# Patient Record
Sex: Male | Born: 1940 | Race: White | Hispanic: No | Marital: Married | Smoking: Former smoker
Health system: Southern US, Community
[De-identification: ages and names within clinical notes are randomized; demographics above are authoritative.]

## PROBLEM LIST (undated history)

## (undated) DIAGNOSIS — Z87442 Personal history of urinary calculi: Secondary | ICD-10-CM

## (undated) DIAGNOSIS — I674 Hypertensive encephalopathy: Secondary | ICD-10-CM

## (undated) DIAGNOSIS — I219 Acute myocardial infarction, unspecified: Secondary | ICD-10-CM

## (undated) DIAGNOSIS — R410 Disorientation, unspecified: Secondary | ICD-10-CM

## (undated) DIAGNOSIS — I6350 Cerebral infarction due to unspecified occlusion or stenosis of unspecified cerebral artery: Secondary | ICD-10-CM

## (undated) DIAGNOSIS — E039 Hypothyroidism, unspecified: Secondary | ICD-10-CM

## (undated) DIAGNOSIS — I609 Nontraumatic subarachnoid hemorrhage, unspecified: Secondary | ICD-10-CM

## (undated) DIAGNOSIS — I2510 Atherosclerotic heart disease of native coronary artery without angina pectoris: Secondary | ICD-10-CM

## (undated) DIAGNOSIS — G709 Myoneural disorder, unspecified: Secondary | ICD-10-CM

## (undated) DIAGNOSIS — E876 Hypokalemia: Secondary | ICD-10-CM

## (undated) DIAGNOSIS — G4730 Sleep apnea, unspecified: Secondary | ICD-10-CM

## (undated) DIAGNOSIS — C140 Malignant neoplasm of pharynx, unspecified: Secondary | ICD-10-CM

## (undated) DIAGNOSIS — N179 Acute kidney failure, unspecified: Secondary | ICD-10-CM

## (undated) DIAGNOSIS — Z8673 Personal history of transient ischemic attack (TIA), and cerebral infarction without residual deficits: Secondary | ICD-10-CM

## (undated) DIAGNOSIS — N529 Male erectile dysfunction, unspecified: Secondary | ICD-10-CM

## (undated) DIAGNOSIS — E785 Hyperlipidemia, unspecified: Secondary | ICD-10-CM

## (undated) DIAGNOSIS — I10 Essential (primary) hypertension: Secondary | ICD-10-CM

## (undated) DIAGNOSIS — R51 Headache: Secondary | ICD-10-CM

## (undated) DIAGNOSIS — I639 Cerebral infarction, unspecified: Secondary | ICD-10-CM

## (undated) HISTORY — DX: Hypertensive encephalopathy: I67.4

## (undated) HISTORY — DX: Atherosclerotic heart disease of native coronary artery without angina pectoris: I25.10

## (undated) HISTORY — PX: EYE SURGERY: SHX253

## (undated) HISTORY — DX: Acute kidney failure, unspecified: N17.9

## (undated) HISTORY — DX: Cerebral infarction due to unspecified occlusion or stenosis of unspecified cerebral artery: I63.50

## (undated) HISTORY — DX: Male erectile dysfunction, unspecified: N52.9

## (undated) HISTORY — PX: OTHER SURGICAL HISTORY: SHX169

## (undated) HISTORY — PX: HERNIA REPAIR: SHX51

## (undated) HISTORY — PX: CORONARY ANGIOPLASTY: SHX604

## (undated) HISTORY — DX: Nontraumatic subarachnoid hemorrhage, unspecified: I60.9

## (undated) HISTORY — PX: APPENDECTOMY: SHX54

## (undated) HISTORY — DX: Hypokalemia: E87.6

## (undated) HISTORY — PX: CARDIAC CATHETERIZATION: SHX172

## (undated) HISTORY — DX: Personal history of urinary calculi: Z87.442

## (undated) HISTORY — DX: Essential (primary) hypertension: I10

## (undated) HISTORY — DX: Hyperlipidemia, unspecified: E78.5

## (undated) HISTORY — DX: Malignant neoplasm of pharynx, unspecified: C14.0

## (undated) HISTORY — DX: Personal history of transient ischemic attack (TIA), and cerebral infarction without residual deficits: Z86.73

## (undated) HISTORY — DX: Sleep apnea, unspecified: G47.30

---

## 1991-03-21 HISTORY — PX: ANGIOPLASTY: SHX39

## 1998-04-09 ENCOUNTER — Ambulatory Visit (HOSPITAL_BASED_OUTPATIENT_CLINIC_OR_DEPARTMENT_OTHER): Admission: RE | Admit: 1998-04-09 | Discharge: 1998-04-09 | Payer: Self-pay | Admitting: Otolaryngology

## 1998-04-12 ENCOUNTER — Encounter: Admission: RE | Admit: 1998-04-12 | Discharge: 1998-07-11 | Payer: Self-pay | Admitting: Radiation Oncology

## 1998-04-16 ENCOUNTER — Encounter (HOSPITAL_COMMUNITY): Admission: RE | Admit: 1998-04-16 | Discharge: 1998-07-15 | Payer: Self-pay | Admitting: Dentistry

## 1998-06-25 ENCOUNTER — Encounter (HOSPITAL_COMMUNITY): Payer: Self-pay | Admitting: Dentistry

## 1998-09-27 ENCOUNTER — Ambulatory Visit (HOSPITAL_COMMUNITY): Admission: RE | Admit: 1998-09-27 | Discharge: 1998-09-27 | Payer: Self-pay | Admitting: Gastroenterology

## 1998-10-01 ENCOUNTER — Ambulatory Visit (HOSPITAL_BASED_OUTPATIENT_CLINIC_OR_DEPARTMENT_OTHER): Admission: RE | Admit: 1998-10-01 | Discharge: 1998-10-01 | Payer: Self-pay | Admitting: Surgery

## 1998-11-08 ENCOUNTER — Encounter (HOSPITAL_COMMUNITY): Admission: RE | Admit: 1998-11-08 | Discharge: 1999-02-06 | Payer: Self-pay | Admitting: Dentistry

## 1998-11-12 ENCOUNTER — Ambulatory Visit (HOSPITAL_BASED_OUTPATIENT_CLINIC_OR_DEPARTMENT_OTHER): Admission: RE | Admit: 1998-11-12 | Discharge: 1998-11-12 | Payer: Self-pay | Admitting: Surgery

## 2000-02-29 ENCOUNTER — Ambulatory Visit (HOSPITAL_COMMUNITY): Admission: RE | Admit: 2000-02-29 | Discharge: 2000-02-29 | Payer: Self-pay | Admitting: Radiation Oncology

## 2001-05-20 ENCOUNTER — Observation Stay (HOSPITAL_COMMUNITY): Admission: EM | Admit: 2001-05-20 | Discharge: 2001-05-21 | Payer: Self-pay | Admitting: Emergency Medicine

## 2001-05-20 ENCOUNTER — Encounter: Payer: Self-pay | Admitting: Emergency Medicine

## 2003-07-13 ENCOUNTER — Ambulatory Visit (HOSPITAL_COMMUNITY): Admission: RE | Admit: 2003-07-13 | Discharge: 2003-07-13 | Payer: Self-pay | Admitting: Gastroenterology

## 2003-07-13 ENCOUNTER — Encounter (INDEPENDENT_AMBULATORY_CARE_PROVIDER_SITE_OTHER): Payer: Self-pay | Admitting: *Deleted

## 2003-08-01 ENCOUNTER — Inpatient Hospital Stay (HOSPITAL_COMMUNITY): Admission: EM | Admit: 2003-08-01 | Discharge: 2003-08-03 | Payer: Self-pay | Admitting: Emergency Medicine

## 2003-08-13 ENCOUNTER — Ambulatory Visit: Admission: RE | Admit: 2003-08-13 | Discharge: 2003-08-13 | Payer: Self-pay | Admitting: Radiation Oncology

## 2003-09-28 ENCOUNTER — Ambulatory Visit (HOSPITAL_COMMUNITY): Admission: RE | Admit: 2003-09-28 | Discharge: 2003-09-28 | Payer: Self-pay | Admitting: Neurology

## 2006-03-20 HISTORY — PX: PATENT FORAMEN OVALE CLOSURE: SHX2181

## 2006-03-22 ENCOUNTER — Ambulatory Visit (HOSPITAL_COMMUNITY): Admission: RE | Admit: 2006-03-22 | Discharge: 2006-03-22 | Payer: Self-pay | Admitting: Cardiology

## 2006-03-22 ENCOUNTER — Encounter: Payer: Self-pay | Admitting: Cardiology

## 2006-03-25 ENCOUNTER — Inpatient Hospital Stay (HOSPITAL_COMMUNITY): Admission: EM | Admit: 2006-03-25 | Discharge: 2006-03-25 | Payer: Self-pay | Admitting: Emergency Medicine

## 2006-04-09 ENCOUNTER — Encounter: Admission: RE | Admit: 2006-04-09 | Discharge: 2006-04-09 | Payer: Self-pay | Admitting: Cardiology

## 2006-04-13 ENCOUNTER — Ambulatory Visit (HOSPITAL_COMMUNITY): Admission: AD | Admit: 2006-04-13 | Discharge: 2006-04-14 | Payer: Self-pay | Admitting: Cardiology

## 2006-04-24 ENCOUNTER — Ambulatory Visit: Payer: Self-pay | Admitting: Vascular Surgery

## 2006-04-24 ENCOUNTER — Ambulatory Visit: Admission: RE | Admit: 2006-04-24 | Discharge: 2006-04-24 | Payer: Self-pay | Admitting: Cardiology

## 2009-05-08 ENCOUNTER — Inpatient Hospital Stay (HOSPITAL_COMMUNITY): Admission: EM | Admit: 2009-05-08 | Discharge: 2009-05-14 | Payer: Self-pay | Admitting: Pulmonary Disease

## 2009-05-08 ENCOUNTER — Ambulatory Visit: Payer: Self-pay | Admitting: Internal Medicine

## 2009-05-11 ENCOUNTER — Encounter (INDEPENDENT_AMBULATORY_CARE_PROVIDER_SITE_OTHER): Payer: Self-pay | Admitting: Pulmonary Disease

## 2009-05-31 ENCOUNTER — Inpatient Hospital Stay (HOSPITAL_COMMUNITY): Admission: EM | Admit: 2009-05-31 | Discharge: 2009-06-02 | Payer: Self-pay | Admitting: Emergency Medicine

## 2009-12-16 ENCOUNTER — Ambulatory Visit: Payer: Self-pay | Admitting: Cardiology

## 2010-04-11 ENCOUNTER — Encounter
Admission: RE | Admit: 2010-04-11 | Discharge: 2010-04-11 | Payer: Self-pay | Source: Home / Self Care | Attending: Internal Medicine | Admitting: Internal Medicine

## 2010-04-19 ENCOUNTER — Ambulatory Visit: Payer: Self-pay | Admitting: Cardiology

## 2010-06-08 LAB — CARDIAC PANEL(CRET KIN+CKTOT+MB+TROPI)
CK, MB: 4.3 ng/mL — ABNORMAL HIGH (ref 0.3–4.0)
Relative Index: 0.1 (ref 0.0–2.5)
Relative Index: 0.2 (ref 0.0–2.5)
Relative Index: 0.2 (ref 0.0–2.5)
Total CK: 4910 U/L — ABNORMAL HIGH (ref 7–232)
Troponin I: 0.69 ng/mL (ref 0.00–0.06)

## 2010-06-08 LAB — DIFFERENTIAL
Basophils Absolute: 0 10*3/uL (ref 0.0–0.1)
Basophils Absolute: 0 10*3/uL (ref 0.0–0.1)
Basophils Absolute: 0.1 10*3/uL (ref 0.0–0.1)
Eosinophils Relative: 6 % — ABNORMAL HIGH (ref 0–5)
Lymphocytes Relative: 10 % — ABNORMAL LOW (ref 12–46)
Lymphocytes Relative: 3 % — ABNORMAL LOW (ref 12–46)
Lymphocytes Relative: 7 % — ABNORMAL LOW (ref 12–46)
Monocytes Absolute: 0.4 10*3/uL (ref 0.1–1.0)
Monocytes Relative: 7 % (ref 3–12)
Neutro Abs: 12.2 10*3/uL — ABNORMAL HIGH (ref 1.7–7.7)
Neutro Abs: 5.6 10*3/uL (ref 1.7–7.7)
Neutrophils Relative %: 80 % — ABNORMAL HIGH (ref 43–77)

## 2010-06-08 LAB — COMPREHENSIVE METABOLIC PANEL
AST: 52 U/L — ABNORMAL HIGH (ref 0–37)
Albumin: 2.4 g/dL — ABNORMAL LOW (ref 3.5–5.2)
Alkaline Phosphatase: 45 U/L (ref 39–117)
BUN: 11 mg/dL (ref 6–23)
CO2: 27 mEq/L (ref 19–32)
Chloride: 107 mEq/L (ref 96–112)
Chloride: 108 mEq/L (ref 96–112)
Creatinine, Ser: 0.74 mg/dL (ref 0.4–1.5)
Creatinine, Ser: 0.81 mg/dL (ref 0.4–1.5)
GFR calc Af Amer: >60 mL/min (ref >60)
GFR calc non Af Amer: >60 mL/min (ref >60)
Potassium: 3.6 mEq/L (ref 3.5–5.1)
Total Bilirubin: 0.8 mg/dL (ref 0.3–1.2)
Total Bilirubin: 1.4 mg/dL — ABNORMAL HIGH (ref 0.3–1.2)

## 2010-06-08 LAB — BLOOD GAS, ARTERIAL
Acid-Base Excess: 1.6 mmol/L (ref 0.0–2.0)
MECHVT: 700 mL
PEEP: 5 cmH2O
TCO2: 25.9 mmol/L (ref 0–100)
pH, Arterial: 7.48 — ABNORMAL HIGH (ref 7.350–7.450)

## 2010-06-08 LAB — BASIC METABOLIC PANEL
BUN: 13 mg/dL (ref 6–23)
BUN: 14 mg/dL (ref 6–23)
CO2: 26 mEq/L (ref 19–32)
CO2: 27 mEq/L (ref 19–32)
CO2: 28 mEq/L (ref 19–32)
Calcium: 8 mg/dL — ABNORMAL LOW (ref 8.4–10.5)
Chloride: 107 mEq/L (ref 96–112)
Chloride: 111 mEq/L (ref 96–112)
Chloride: 111 mEq/L (ref 96–112)
Creatinine, Ser: 0.77 mg/dL (ref 0.4–1.5)
Creatinine, Ser: 1.01 mg/dL (ref 0.4–1.5)
Creatinine, Ser: 1.21 mg/dL (ref 0.4–1.5)
GFR calc Af Amer: >60 mL/min (ref >60)
GFR calc Af Amer: >60 mL/min (ref >60)
GFR calc non Af Amer: >60 mL/min (ref >60)
Glucose, Bld: 104 mg/dL — ABNORMAL HIGH (ref 70–99)
Glucose, Bld: 113 mg/dL — ABNORMAL HIGH (ref 70–99)
Glucose, Bld: 117 mg/dL — ABNORMAL HIGH (ref 70–99)
Potassium: 2.8 mEq/L — ABNORMAL LOW (ref 3.5–5.1)
Potassium: 2.8 mEq/L — ABNORMAL LOW (ref 3.5–5.1)
Potassium: 3.1 mEq/L — ABNORMAL LOW (ref 3.5–5.1)
Potassium: 3.1 mEq/L — ABNORMAL LOW (ref 3.5–5.1)
Sodium: 136 mEq/L (ref 135–145)
Sodium: 140 mEq/L (ref 135–145)
Sodium: 143 mEq/L (ref 135–145)

## 2010-06-08 LAB — CBC
HCT: 34.3 % — ABNORMAL LOW (ref 39.0–52.0)
HCT: 36.4 % — ABNORMAL LOW (ref 39.0–52.0)
HCT: 38.6 % — ABNORMAL LOW (ref 39.0–52.0)
Hemoglobin: 11.8 g/dL — ABNORMAL LOW (ref 13.0–17.0)
Hemoglobin: 12.8 g/dL — ABNORMAL LOW (ref 13.0–17.0)
MCHC: 34.4 g/dL (ref 30.0–36.0)
MCV: 98.1 fL (ref 78.0–100.0)
MCV: 98.3 fL (ref 78.0–100.0)
MCV: 99.6 fL (ref 78.0–100.0)
Platelets: 110 10*3/uL — ABNORMAL LOW (ref 150–400)
Platelets: 145 10*3/uL — ABNORMAL LOW (ref 150–400)
Platelets: 173 10*3/uL (ref 150–400)
RBC: 3.46 MIL/uL — ABNORMAL LOW (ref 4.22–5.81)
RBC: 3.7 MIL/uL — ABNORMAL LOW (ref 4.22–5.81)
RBC: 3.93 MIL/uL — ABNORMAL LOW (ref 4.22–5.81)
RDW: 13 % (ref 11.5–15.5)
RDW: 13.4 % (ref 11.5–15.5)
WBC: 6.1 10*3/uL (ref 4.0–10.5)
WBC: 6.7 10*3/uL (ref 4.0–10.5)
WBC: 7 10*3/uL (ref 4.0–10.5)

## 2010-06-08 LAB — HEPATIC FUNCTION PANEL
ALT: 41 U/L (ref 0–53)
AST: 138 U/L — ABNORMAL HIGH (ref 0–37)
Albumin: 3.1 g/dL — ABNORMAL LOW (ref 3.5–5.2)
Alkaline Phosphatase: 48 U/L (ref 39–117)
Bilirubin, Direct: 0.3 mg/dL (ref 0.0–0.3)
Indirect Bilirubin: 1.1 mg/dL — ABNORMAL HIGH (ref 0.3–0.9)
Total Bilirubin: 1.4 mg/dL — ABNORMAL HIGH (ref 0.3–1.2)
Total Protein: 6.1 g/dL (ref 6.0–8.3)

## 2010-06-08 LAB — URINE MICROSCOPIC-ADD ON

## 2010-06-08 LAB — PROTIME-INR: Prothrombin Time: 16.6 seconds — ABNORMAL HIGH (ref 11.6–15.2)

## 2010-06-08 LAB — URINALYSIS, ROUTINE W REFLEX MICROSCOPIC
Leukocytes, UA: NEGATIVE
Nitrite: NEGATIVE
Specific Gravity, Urine: 1.013 (ref 1.005–1.030)
pH: 7 (ref 5.0–8.0)

## 2010-06-08 LAB — GLUCOSE, CAPILLARY: Glucose-Capillary: 88 mg/dL (ref 70–99)

## 2010-06-08 LAB — LIPASE, BLOOD: Lipase: 27 U/L (ref 11–59)

## 2010-06-08 LAB — LACTIC ACID, PLASMA: Lactic Acid, Venous: 2.1 mmol/L (ref 0.5–2.2)

## 2010-06-08 LAB — CULTURE, BLOOD (ROUTINE X 2): Culture: NO GROWTH

## 2010-06-08 LAB — CULTURE, BAL-QUANTITATIVE W GRAM STAIN

## 2010-06-08 LAB — CK TOTAL AND CKMB (NOT AT ARMC): Relative Index: 0.1 (ref 0.0–2.5)

## 2010-06-08 LAB — MAGNESIUM: Magnesium: 2.1 mg/dL (ref 1.5–2.5)

## 2010-06-10 LAB — METANEPHRINES, URINE, 24 HOUR
Metanephrines, Ur: 62 mcg/24 h — ABNORMAL LOW (ref 90–315)
Volume, Urine-METAN: 775 mL

## 2010-06-10 LAB — BASIC METABOLIC PANEL
BUN: 16 mg/dL (ref 6–23)
CO2: 28 mEq/L (ref 19–32)
CO2: 30 mEq/L (ref 19–32)
Calcium: 8.5 mg/dL (ref 8.4–10.5)
Chloride: 108 mEq/L (ref 96–112)
Creatinine, Ser: 1 mg/dL (ref 0.4–1.5)
GFR calc Af Amer: >60 mL/min (ref >60)
GFR calc non Af Amer: >60 mL/min (ref >60)
Glucose, Bld: 101 mg/dL — ABNORMAL HIGH (ref 70–99)
Glucose, Bld: 119 mg/dL — ABNORMAL HIGH (ref 70–99)
Potassium: 3.3 mEq/L — ABNORMAL LOW (ref 3.5–5.1)
Sodium: 143 mEq/L (ref 135–145)

## 2010-06-10 LAB — POCT CARDIAC MARKERS
CKMB, poc: 1.2 ng/mL (ref 1.0–8.0)
Myoglobin, poc: 73.5 ng/mL (ref 12–200)
Troponin i, poc: <0.05 ng/mL (ref 0.00–0.09)

## 2010-06-10 LAB — DIFFERENTIAL
Basophils Absolute: 0 10*3/uL (ref 0.0–0.1)
Basophils Relative: 0 % (ref 0–1)
Eosinophils Absolute: 0.2 10*3/uL (ref 0.0–0.7)
Eosinophils Relative: 3 % (ref 0–5)
Monocytes Absolute: 0.3 10*3/uL (ref 0.1–1.0)

## 2010-06-10 LAB — URINALYSIS, ROUTINE W REFLEX MICROSCOPIC
Bilirubin Urine: NEGATIVE
Glucose, UA: NEGATIVE mg/dL
Ketones, ur: NEGATIVE mg/dL
Specific Gravity, Urine: 1.015 (ref 1.005–1.030)
pH: 7.5 (ref 5.0–8.0)

## 2010-06-10 LAB — CBC
HCT: 43.6 % (ref 39.0–52.0)
MCHC: 34.5 g/dL (ref 30.0–36.0)
MCV: 97.6 fL (ref 78.0–100.0)
Platelets: 196 10*3/uL (ref 150–400)
RDW: 12.9 % (ref 11.5–15.5)

## 2010-06-10 LAB — CK TOTAL AND CKMB (NOT AT ARMC): Relative Index: INVALID (ref 0.0–2.5)

## 2010-07-26 ENCOUNTER — Other Ambulatory Visit: Payer: Self-pay | Admitting: Cardiology

## 2010-07-26 MED ORDER — LABETALOL HCL 200 MG PO TABS: ORAL_TABLET | ORAL | Status: DC

## 2010-07-26 NOTE — Telephone Encounter (Signed)
Was calling to get a new prescription for 200mg  pills Labetalol. Phramacy ran out of 100mg  pills and told him that they had 200mg  available and just to cut them in half. Please call back. I have pulled the chart.

## 2010-07-26 NOTE — Telephone Encounter (Signed)
Called wanting [place] to send Rx in for Labetalol 200 mg to [facility]; pharm is out of 100 mg. So he can take 1/2 tablet twice a day. Sent to [facility]

## 2010-07-27 ENCOUNTER — Other Ambulatory Visit: Payer: Self-pay | Admitting: *Deleted

## 2010-07-27 MED ORDER — LABETALOL HCL 200 MG PO TABS: ORAL_TABLET | ORAL | Status: DC

## 2010-07-27 NOTE — Telephone Encounter (Signed)
REFILL DONE

## 2010-08-05 NOTE — Discharge Summary (Signed)
NAME:  Benjamin Park, Benjamin Park                          ACCOUNT NO.:  [id number]   MEDICAL RECORD NO.:  [id number]                   PATIENT TYPE:  INP   LOCATION:  3019                                 FACILITY:  [facility]   PHYSICIAN:  Marlan Palau, M.D.               DATE OF BIRTH:  [birth year]   DATE OF ADMISSION:  08/01/2003  DATE OF DISCHARGE:  08/03/2003                                 DISCHARGE SUMMARY   ADMISSION DIAGNOSES:  1. Probable transient ischemic attack with transient aphasia.  2. Headache.  3. Hypertension.   DISCHARGE DIAGNOSES:  1. Transient event, etiology unclear, rule out hypertensive encephalopathy.  2. Abnormal MRI scan of the brain with bilateral superior cerebellar     peduncle white matter abnormalities, rule out post radiation changes.  3. History of hypertension.   PROCEDURE:  1. CT of the head.  2. MRI of the brain.  3. MRI/angiogram.  4. Lumbar puncture.  5. Echocardiogram.   COMPLICATIONS:  Complications with above procedures, none.   HISTORY OF PRESENT ILLNESS:  Benjamin Park is a 70 year old right-handed  white male born 1940/06/18, with a history of hypertension and  hypercholesterolemia, coronary artery disease.  The patient comes to [facility] for evaluation of confusion and dysnomia that began about 4:15  in the morning, and was associated with a fairly-significant bifrontal  headache.  The patient took some Motrin for the headache, but was noted by  his wife to be mixing up words.  He seemed to be confused.  He was unable to  recall the names of his medications.  After about 45 minutes, the patient  had resolution of his speech problems.  The headache persisted but is not as  severe.   A CT scan of the brain showed no acute changes.  Some mild atrophy is seen.  Blood pressure on initial evaluation was 191/114.  Neurology is asked to see  the patient for further evaluation.  The patient reported no focal numbness  or  weakness on the face, arms or legs.  He did not have any gait  disturbances or visual field changes.   PAST MEDICAL HISTORY:  Significant for:  1. A history of transient aphasia.  2. Headache.  3. Hypertension.  4. Hypercholesterolemia,  5. History of myocardial infarction in 1992.  6. History of appendectomy.  7. Throat cancer, status post radiation therapy.  8. History of basal cell carcinoma resected on the right forehead x2.  9. History of inguinal hernia repair.   MEDICATIONS:  1. Lisinopril 20 mg a day.  2. Lopressor 50 mg a day.  3. Lovastatin 20 mg a day.  4. Enteric-coated aspirin 325 mg a day.  5. Vitamin E and vitamin C supplementation.  6. Multivitamins.   ALLERGIES:  No known allergies.   SOCIAL HISTORY:  He does not currently smoke or drink.   Please refer  to the history and physical dictation for social history,  family history, review of systems and physical examination.   LABORATORY DATA:  Ammonia level of 8, sed rate 9, RPR nonreactive, VDRL is  pending at this time.  CSF glucose is 63, total protein 75, one red cell, 0  white cells.  Oligoclonal banding is pending.  Homocysteine level is 9.1.  Cholesterol level total is 130, HDL 31, VLDL 29, LDL 70.  White count 5.8,  hemoglobin 14.7, hematocrit 41.7, MCA 92.8, platelets 169,000.  INR is 1.0.  Sodium 140, potassium 3.8, chloride 107, glucose 108, BUN 21, creatinine  1.1.   EKG reveals normal sinus rhythm, normal EKG, heart rate 81.  Chest x-ray  shows no active disease.   HOSPITAL COURSE:  The patient has done well during the course of  hospitalization.  The patient cleared essentially by the time he was  admitted, and remained in a normal state throughout the hospitalization.  The patient was set up for an MRI scan of the brain which was done.  This  showed evidence of small vessel ischemic changes.  The patient also appeared  to have an unusual pattern of increased signal on flare without extremely   bright shine through on the diffusion weighted image within the superior  cerebellar peduncles on both sides in a symmetric fashion.  The cause of  this is not clear.  The patient has had good blood pressures recorded  throughout this hospitalization running in the 110 to 120/70 range.  The  patient has done quite well with resolution of his headache, normal speech  pattern, fully ambulatory, no sensory or motor changes.  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  Question of whether the changes are a result from radiation  changes, but I suspect most likely cause of this is a hypertensive  encephalopathy.  If this is the case, the above lesions may disappear.  A 2-  D echocardiogram has been performed, but the results are pending at this  time.   DISCHARGE MEDICATIONS:  The patient will be discharged on:  1. Lisinopril 20 mg a day.  2. Lopressor 50 mg a day.  3. Lovastatin 20 mg daily.  4. Aspirin 325 mg daily.  5. Vitamin E.  6. Vitamin C.  7. Multivitamin supplementation.   FOLLOWUP:  1. The patient will follow up with [facility] in three     weeks following discharge.  2. We will repeat an MRI scan of the brain in four to six weeks.  3. An ANA rheumatoid factor, angiotensin converting enzyme level has been     drawn prior to discharge.   DISCHARGE CONDITION:  At the time of discharge, the patient was alert,  cooperative and fully ambulatory.                                                Marlan Palau, M.D.    CKW/MEDQ  D:  08/03/2003  T:  08/04/2003  Job:  [id number]   cc:   [facility]  [address]   [place] L. Little, M.D.  [address]  [place]  [place] [address]  Fax: [phone]

## 2010-08-05 NOTE — H&P (Signed)
NAME:  Benjamin Park, Benjamin Park NO.:  [id number]   MEDICAL RECORD NO.:  [id number]          PATIENT TYPE:  INP   LOCATION:  4705                         FACILITY:  [facility]   PHYSICIAN:  Melvyn Novas, M.D.  DATE OF BIRTH:  1940-12-31   DATE OF ADMISSION:  03/24/2006  DATE OF DISCHARGE:                              HISTORY & PHYSICAL   Mr. Benjamin Park was seen on March 24, 2006 in the [facility]  emergency room, where he sought help after from a 2-day bout of severest  headaches.  The patient had a history of TIAs.  He is a 70 year old  Caucasian gentleman with a history of hypertension, hyperlipidemia and  coronary artery disease as well.  He was evaluated by his cardiologist  at [facility] for a possible PFO, and indeed a PFO was  found just last week showing a fairly large right-to-left shunt.  Patients with persistent foramen ovale are likely to suffer from chronic  vascular headaches such as migraines, and the patient's headache  description is that of associated nausea, photophobia and visual  blurring.  His recent cardiology workup included normal carotid Doppler,  head CT, and here in the ER an MRI was also done to evaluate for  possible TIAs, as his wife had stated he had mental status changes.  The  MRI was negative. Neurology was called to evaluate the patient for TIA  versus headaches.  The patient had an episode fairly similar 2 weeks  ago.   REVIEW OF SYSTEMS:  The patient states that he is nauseated.  He feels  confused and appears agitated.  His wife __________ out and states that  he has been complaining about feeling too hot and feeling itchy.  It  turns out that the patient just received some Dilaudid prior to the  neurologic evaluation and had slept for about 2 hours.  His cardiologist  had kindly agreed to see him and evaluated the patient in the ER, before  neurology was able to see him.  The concern by cardiology was that the  patient seems to have a slight personality change and confusional  episode.  Again, this could have been attributable to the medication and  indeed dissolved quickly, and the patient received IV fluids for  dehydration and Zofran for nausea.   PAST MEDICAL HISTORY:  Hypertension, coronary artery disease, TIAs x7  and migraine headaches, hyperlipidemia.   MEDICATIONS:  1. Lovastatin 40 mg a day.  2. Toprol XL 50 mg a day.  3. Lisinopril 20 mg a day.  4. Hydrochlorothiazide 25 grams a day.  5. Fish oil and full-size aspirin.  The patient had in the past been placed on Plavix and states that he has  developed ciliary injection and blood vessels in his eyes burst.  He  was asked by his ophthalmologist not to continue the medication.   SOCIAL HISTORY:  The patient is married, remote smoker, no ETOH.   PHYSICAL EXAMINATION:  VITAL SIGNS:  The patient presents with elevated  blood pressure and heart rate.  At the time of his admission,  he had a  blood pressure of 180/120 which has now turned down to 160/87.  Heart  rate was not tachycardiac but in the low 70s.  He is febrile with a  respiratory rate of 16.  LUNGS:  Clear to auscultation.  COR:  Regular rate and rhythm.  HEENT:  There is no temporal artery induration or TMJ pain.  No neck  pain or meningismus.  GENERAL:  The patient is alert and oriented x3.  ABDOMEN:  Soft.  No flank pain.  The patient complains about an itch.  CRANIAL NERVE:  Examination shows no tongue or uvula deviation, full  extraocular movements.  Pupils are restricted in their response to  light, probably due to the recent received narcotics.  EXTREMITIES:  The patient could extend both extremities against both  upper extremities against gravity for over 10 seconds without a drift.  He could do the same for 5 seconds with his lower extremities without a  drift.  He has symmetric reflexes, downgoing toes to plantar stimulation  and no focal sensory loss.    ASSESSMENT:  This patient is unlikely to have suffered a TIA or stroke,  and the MRI was also not able to prove it.  As to his open PFO, it is  definitely a risk factor for vascular headaches, and we will try to  allow the patient to have it closed as soon as possible.  I learned  after the patient was admitted to the floor, that Dr. Jacinto Halim will only be  available for this procedure on Thursday.  Today is Sunday morning. I  suggested that the patient will be discharged home with pain  medications, once he had a couple of hours of sleep and is headache  free.  We did not address the coronary artery disease or hyperlipidemia  today.  The patient can remain on aspirin.  I will prescribe 1 gram of  Depakene fast infusion, and if this relieves the patient's headache, I  will place him on 250 mg Depakote at night for headache prophylaxis.  The discharge is planned for Sunday morning.  This stay will be an  observational 23-hour stay on the Neurology Service.      Melvyn Novas, M.D.  Electronically Signed     CD/MEDQ  D:  03/25/2006  T:  03/25/2006  Job:  [id number]   cc:   [facility]  Peter M. Swaziland, M.D.  Primary Care Physician

## 2010-08-05 NOTE — Op Note (Signed)
NAME:  Benjamin Park, Benjamin Park                          ACCOUNT NO.:  [id number]   MEDICAL RECORD NO.:  [id number]                   PATIENT TYPE:  AMB   LOCATION:  ENDO                                 FACILITY:  [facility]   PHYSICIAN:  Petra Kuba, M.D.                 DATE OF BIRTH:  03-22-1940   DATE OF PROCEDURE:  07/13/2003  DATE OF DISCHARGE:                                 OPERATIVE REPORT   PROCEDURE PERFORMED:  Colonoscopy with polypectomy.   ENDOSCOPIST:  Petra Kuba, M.D.   INDICATIONS FOR PROCEDURE:  Patient with history of colon polyps, due for  repeat screening.  Consent was signed after the risks, benefits, methods and  options were thoroughly discussed multiple occasions in the past.   MEDICINES USED:  Demerol 80 mg, Versed 8 mg.   DESCRIPTION OF PROCEDURE:  Rectal inspection was pertinent for small  external hemorrhoids.  Digital exam was negative.  A video pediatric  adjustable colonoscope was inserted and with some difficulty due to looping  and tortuosity which required rolling him on his back and then on his right  side and various abdominal pressures, we were able to advance to the cecum.  There was an occasional left-sided diverticula and possibly a rare right one  seen on insertion but no other abnormality.  The cecum was identified by the  appendiceal orifice and the ileocecal valve.  The prep was adequate.  There  was some liquid stool that required washing and suctioning.  On slow  withdrawal through the colon, along one of the cecal folds, was a  questionable polyp which was hot biopsied times four.  The scope was further  withdrawn.  In the midascending, another questionable smaller sessile polyp  was seen and hot biopsied times two.  They were put in separate containers.  The scope was further withdrawn.  Other than the diverticula mentioned  above, no other abnormalities were seen except for a tiny distal sigmoid  polyp which was hot biopsied and put in a  third container.  Anorectal  pullthrough and retroflexion confirmed some small hemorrhoids.  Scope was  straightened and readvanced a short ways up the left side of the colon, air  was suctioned, scope removed.  The patient tolerated the procedure  adequately.  There was no immediate obvious complication.   ENDOSCOPIC DIAGNOSIS:  1. Internal and external small hemorrhoids.  2. Occasional left and rare right diverticula.  3. Long looping colon.  4. Tiny distal sigmoid hot biopsied.  5. Proximal ascending sessile polyp hot biopsied.  6. Cecal fold sessile hot biopsied.  7. Otherwise within normal limits to the cecum.   PLAN:  Await pathology results to determine future colonic screening,  particularly if these polyps are in the first and second container on the  right side are adenomatous and otherwise would continue work-up with an  esophagogastroduodenoscopy.  Petra Kuba, M.D.    MEM/MEDQ  D:  07/13/2003  T:  07/13/2003  Job:  [id number]   cc:   Caryn Bee L. Little, M.D.  [address]  [place]  [place] [address]  Fax: [phone]

## 2010-08-05 NOTE — Op Note (Signed)
NAME:  Benjamin Park, Benjamin Park                          ACCOUNT NO.:  [id number]   MEDICAL RECORD NO.:  [id number]                   PATIENT TYPE:  INP   LOCATION:  3019                                 FACILITY:  [facility]   PHYSICIAN:  Genene Churn. Love, M.D.                 DATE OF BIRTH:  October 07, 1940   DATE OF PROCEDURE:  DATE OF DISCHARGE:                                 OPERATIVE REPORT   CLINICAL INDICATIONS:  The patient has an abnormal MRI study of the brain  showing evidence of bilateral middle cerebellar peduncle demyelinating  changes.  The patient is also complaining of the worse headache he has ever  had associated with aphasia and confusion.   PROCEDURE NOTE:  The patient was prepped and draped in the left lateral  decubitus position using Betadine and 1% Xylocaine.  The L4-L5 interspace  was entered without difficulty.  Opening pressure was 180 mm H2O with clear  colorless cerebrospinal fluid obtained.  The CSF was sent for VDRL, protein,  glucose, cell count, differential, IgG and oligoclonal IgG with one tube to  be held.  The patient tolerated the procedure well.                                               Genene Churn. Sandria Manly, M.D.    JML/MEDQ  D:  08/02/2003  T:  08/02/2003  Job:  [id number]

## 2010-08-05 NOTE — Discharge Summary (Signed)
[place]  Patient:    Benjamin Park Visit Number: [id number] MRN: [id number]          Service Type: MED Location: [id number] Attending Physician:  Jackie Plum Dictated by:   Jerl Santos, M.D. Admit Date:  05/20/2001 Discharge Date: 05/21/2001   CC:         Julieanne Manson, M.D.  Peter M. Swaziland, M.D.   Discharge Summary  HISTORY OF PRESENT ILLNESS AND HOSPITAL COURSE:  Benjamin Park is a 70 year old man with a history of coronary artery disease, status post angioplasty approximately 9 years ago. He states that he has had periodic stress test done by [name], most recently about 2-1/2 years ago.  This stress test was completely negative.  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  He said he had been working in this way, and came home when he suddenly felt very dizzy and light-headed.  He felt like things were spinning around.  There was no associated headache.  There was no focal weakness or numbness.  There is no breathing difficulty or chest pain.  No palpitation, no nausea or vomiting.  In the emergency room, the concern was raised about the possibility that the patient may be having a cardiac problem.  Therefore, Dr. Julio Sicks was asked to see him, and he admitted him for observation.  During the brief hospitalization, laboratory studies obtained included serial troponins which were negative at 0.01. Negative cardiac enzymes.  Negative clotting studies, and normal CBC. Electrocardiogram x2 was negative.  No abnormalities were detected on telemetry.  When I saw the patient initially on 05/21/01, he was feeling entirely well.  He had no complaints.  His physical examination was within normal limits.  Therefore, I chose to discharge the patient at that time with the understanding that we would arrange for him to  have a followup stress test done by [name] as an outpatient.  DISCHARGE MEDICATIONS: 1. Toprol XL 50 mg q.d. 2. Lisinopril 10 mg q.d. 3. Aspirin 325 mg q.d.  FOLLOWUP:  [name] and Dr. Clarene Duke as indicated.  DISCHARGE DIAGNOSES: 1. Probable acute vertigo. 2. History of coronary artery disease, status post coronary artery bypass    graft. 3. History of hypertension. Dictated by:   Jerl Santos, M.D. Attending Physician:  Jackie Plum DD:  05/21/01 TD:  05/22/01 Job: [id number]

## 2010-08-05 NOTE — Op Note (Signed)
NAME:  Benjamin Park, Benjamin Park                          ACCOUNT NO.:  [id number]   MEDICAL RECORD NO.:  [id number]                   PATIENT TYPE:  AMB   LOCATION:  ENDO                                 FACILITY:  [facility]   PHYSICIAN:  Petra Kuba, M.D.                 DATE OF BIRTH:  Aug 04, 1940   DATE OF PROCEDURE:  07/13/2003  DATE OF DISCHARGE:                                 OPERATIVE REPORT   PREOPERATIVE DIAGNOSIS:  Esophagogastroduodenoscopy with biopsy.   INDICATIONS:  Longstanding upper tract symptoms.  Want to recheck.  Consent  was signed after risks, benefits, methods, and options thoroughly discussed  multiple times in the past.   Additional medicines for this procedure:  20 mg of Demerol, 2 mg of Versed.   PROCEDURE:  The video endoscope was inserted by direct vision.  The proximal  and midesophagus were normal.  In the distal esophagus was a small hiatal  hernia, a widely patent fibrous ring, and a little bit of visible  inflammation along the ring, which was cold biopsied at the end of the  procedure.  The scope was inserted into the stomach, advanced through a  normal antrum, normal pylorus, into the duodenal bulb, pertinent for some  minimal bulbitis, and around the C-loop to a normal second portion of the  duodenum.  The scope was withdrawn back to the bulb and a good look there  confirmed the above findings.  The scope was withdrawn back to the stomach  and retroflexed.  Angularis, fundus were normal.  High in the cardia the  hiatal hernia was confirmed.  Lesser and greater curve were evaluated on  retroflex and then straight visualization without additional findings.  The  biopsies at the GE junction and the ring were obtained at this time.  Air  was suctioned, the scope was slowly withdrawn.  Again a good look at the  remainder of the esophagus was normal.  The scope was removed.  The patient  tolerated the procedure well.  There was no obvious immediate complication.   ENDOSCOPIC DIAGNOSES:  1. Small hiatal hernia with a widely patent ring and some minimal     inflammation, status post biopsy.  2. Minimal bulbitis.  3. Otherwise normal EGD.   PLAN:  Await pathology.  Happy to see back p.r.n.  Dr. Clarene Duke could change  him from his H2 blockers to pump inhibitors as needed.  Happy to dilate if  increased swallowing problems, but otherwise return care to Dr. Clarene Duke for  the customary health care maintenance to include yearly rectals and guaiacs.                                               Petra Kuba, M.D.    MEM/MEDQ  D:  07/13/2003  T:  07/13/2003  Job:  [id number]   cc:   Caryn Bee L. Little, M.D.  [address]  [place]  [place] [address]  Fax: [phone]

## 2010-08-05 NOTE — Cardiovascular Report (Signed)
NAME:  Benjamin Park, Benjamin Park                ACCOUNT NO.:  [id number]   MEDICAL RECORD NO.:  [id number]          PATIENT TYPE:  INP   LOCATION:  6526                         FACILITY:  [facility]   PHYSICIAN:  Cristy Hilts. Jacinto Halim, MD       DATE OF BIRTH:  1940-04-07   DATE OF PROCEDURE:  04/13/2006  DATE OF DISCHARGE:                            CARDIAC CATHETERIZATION   PROCEDURE PERFORMED:  1. Intracardiac echocardiogram.  2. Successful closure of the PFO with a 28-mm CardioSEAL septal      occluder.   INDICATIONS FOR PROCEDURE:  Mr. Benjamin Park is a 70 year old gentleman  with a past medical history of coronary artery disease and history of  anterior myocardial infarction and angioplasty to his LAD in 1993.  First he has had multiple frequent episodes of TIA.  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  Given this, he was brought to the  catheterization laboratory to evaluate his cardiac anatomy for possible  closure of the PFO.   INTRACARDIAC ECHOCARDIOGRAM DATA:  1. Left Atrium.  The left atrium is normal size.  The left atrial      appendage is well visualized and appears to be normal.  2. Intra-atrial septum.  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  There      is a moderate large sized PFO noted with spontaneous right-to-left      shunting by both color Doppler and double contrast injections.  3. Right Atrium.  The right atrium was normal.  4. Left Ventricle.  The left ventricle was basically normal.  5. Right Ventricle.  The right ventricle was normal.  6. Tricuspid Valve.  The tricuspid valve was normal with minimal      tricuspid regurgitation.  7. Mitral Valve.  The mitral valve is normal with mild mitral      regurgitation.  8. Aortic Valve.  The aortic valve was mildly  sclerosed and mildly      thickened, but it was tricuspid.  There was no aortic stenosis or      aortic regurgitation  9. The pericardium was normal.   INTERVENTIONAL DATA:  Successful closure of the PFO with implantation of  a 28-mm CardioSEAL septal occluder.  Double closure double contrast  study was negative for right-to-left shunting.  There was also no color  Doppler evidence of right-to-left shunting.   RECOMMENDATIONS:  The patient will be continued on aspirin indefinitely  and Plavix for at least a period of six months.  He will need  endocarditis prophylaxis for a period of six months.  He will need an  echocardiogram on Monday, that is, in two days to evaluate for  pericardial effusion and to look at the septal occluder.  He will follow  up with Dr. Peter Swaziland in about two to three weeks.  If he remains  stable, he will be discharged home in  the morning.   TECHNIQUE OF THE PROCEDURE:  Under usual sterile precautions using an 8-  French right femoral venous access and a 9-French left humeral venous  access, an intracardiac echocardiogram probe was advanced through the  left pleural venous access sheath.  Intracardiac echocardiogram was  performed, and the cardiac structures were carefully analyzed.   Using a 6-French multipurpose A2 catheter, the catheter was advanced  into the right atrium or a 0.035-inch J-wire.  The PFO was easily  traversed, and the PFO was easily crossed with the help of the  multipurpose A2 catheter.  Then, the catheter was manipulated to engage  the left upper pulmonary vein.   Using a Rosen wire with a soft J-tip, the Rosen wire was carefully  positioned in the left upper pulmonary vein.  Then using a 25-mm sizing  balloon, the PFO size was measured.  It measured 10 mm by angiographic  data and 9 mm by intracardiac echocardiogram.  There was no evidence of  fenestrated septum.  The balloon was returned to the body.   The 8-French sheath was  exchanged to an 11-French Mullins sheath in the  usual fashion, and the intra-atrial septum was easily crossed, and the  Mullins sheath was carefully positioned into the left atrium.   After carefully loading the CardioSEAL septal occluder device on the  delivery catheter, the device was gently advanced through the [facility]  sheath.  After confirming the position of the sheath into the left  atrium, the left atrial disk was carefully deployed under ultrasound  guidance under the intracardiac echocardiogram guidance.  Then, the  CardioSEAL septal occluder was gently withdrawn, and after obtaining  adequate opposition to the intra-atrial septum, the right atrial side of  the device was deployed.  There was excellent opposition noted.  There  was no evidence of any color-flow Doppler evidence of right-to-left  shunting, neither was double-contrast injection evidence of right-to-  left shunting.  Then, the device was released, and the catheters were  pulled out of the body in the usual fashion.  The Mullins sheath was  exchanged to a short 11-French sheath, and the sheaths were sutured in  placed.  The patient tolerated the procedure.  No immediate  complications were noted.  During the procedure, the patient did receive  5000 units of intravenous heparin, and his ACT was maintained greater  than 250.      Cristy Hilts. Jacinto Halim, MD  Electronically Signed     JRG/MEDQ  D:  04/13/2006  T:  04/13/2006  Job:  [id number]   cc:   Caryn Bee L. Little, M.D.  Peter M. Swaziland, M.D.

## 2010-08-05 NOTE — H&P (Signed)
NAME:  Benjamin Park, Benjamin Park NO.:  [id number]   MEDICAL RECORD NO.:  [id number]          PATIENT TYPE:  INP   LOCATION:  4705                         FACILITY:  [facility]   PHYSICIAN:  Melvyn Novas, M.D.  DATE OF BIRTH:  04-24-1940   DATE OF ADMISSION:  03/24/2006  DATE OF DISCHARGE:                              HISTORY & PHYSICAL   Benjamin Park is a Caucasian, right-handed gentleman married, presenting  here after suffering from intractable headaches associated with nausea  and visual changes.  The patient was afraid of having suffered a  transient ischemic attack and has a history of hypertension, frequent  headaches and hyperlipidemia.  He was recently diagnosed with an open,  persistent foramen ovale, with indication of right to left shunt.  He is  scheduled next week to have this foramen closed by Dr. Jacinto Halim.  It is  known that a patent foramen ovale can promote migraine headaches and  fairly severe so.   CURRENT MEDICATIONS:  Aspirin, lisinopril, Toprol, Tylenol and  lovastatin.  No dosage, no frequency.   VITAL SIGNS:  The patient arrived here with a blood pressure 193/116, is  now at 160/80.  Heart rate is 95, respiratory rate is 18.  Temperature  is 98 degrees Fahrenheit.  Pulse oximetry is 98%.  The patient did  undergo a CT scan which showed no abnormalities and was reviewed.  The  patient had also an MRI which showed no diffusion-weighted image  abnormality.  The differential of a TIA is still less likely than that  of a complicated migraine, in association with patent foramen ovale.   FAMILY HISTORY:  Hypertension, hyperlipidemia.   SOCIAL HISTORY:  Nonsmoker, nondrinker, married, gainfully employed.   PHYSICAL EXAMINATION:  Shows the patient having clear lungs to  auscultation.  The cardiology colleagues also evaluated the patient's  EKG and found no evidence of carotid bruit.  The patient has a  normocephalic atraumatic scar.  Tongue and uvula were  midline, no  fasciculations.  Full extraocular movements, normal pupillary response  to light.  Full extraocular movements, symmetric facial features,  symmetric extremity movements.  Brisk deep tendon reflexes of 2+ but no  clonus.  The patient follows motor commands.  He seems to be hard of  hearing but follows commands easy, after I raise my voice.  He does have  downgoing toes bilaterally.  He does not have any evidence of asymmetry,  focal numbness, focal weakness.  He still complains about significant  nausea and received over 4 hours ago, here in the ER, Zofran.   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  The patient is awaits closure of the patent foramen ovale next  week.  Meanwhile, I will treat him with that Depakene pump fusion, 1  gram.  This 1-gram dose has to be applied over a 15-minute into bowel,  not longer.  In assistance, I will give him a full-size  aspirin 325 mg, Toprol XL 25 mg and lisinopril 20 mg the patient was  placed on a regular diet.  I hope that the  stay in the hospital the help  to shorten the time period until the patent foramen ovale can be closed  and that this procedure might even be possible on Monday, and that he  can then move to Dr. Verl Dicker service.      Melvyn Novas, M.D.  Electronically Signed     CD/MEDQ  D:  03/25/2006  T:  03/25/2006  Job:  [id number]   cc:   Cristy Hilts. Jacinto Halim, MD

## 2010-08-05 NOTE — H&P (Signed)
NAME:  Benjamin Park, Benjamin Park NO.:  [id number]   MEDICAL RECORD NO.:  [id number]                   PATIENT TYPE:  INP   LOCATION:  1827                                 FACILITY:  [facility]   PHYSICIAN:  Marlan Palau, M.D.               DATE OF BIRTH:  03-08-1941   DATE OF ADMISSION:  08/01/2003  DATE OF DISCHARGE:                                HISTORY & PHYSICAL   HISTORY OF PRESENT ILLNESS:  Benjamin Park is a 70 year old right-handed  white male born 1941/02/19, with a history of hypertension,  hypercholesterolemia, and coronary artery disease.  This patient comes into  the [facility] for an evaluation of onset of confusion and  dysnomia that began around 4:15 this morning.  The patient awakened with a  fairly significant bifrontal headache and got up to go take some Motrin for  this.  The wife noted that he was mixing up his words and seemed confused.  The patient was unable to recall his medications and find the right words  for various things.  The entire event lasted about 45 minutes with some  resolution of the speech problems.  Headache persist, it is not as severe.  The patient has had a CT scan of the brain that shows no acute changes, some  mild atrophy is seen.  Blood pressure on initial evaluation was 191/114.  Neurology was asked to see this patient for further evaluation.  The patient  again does not recall any focal numbness or weakness on the face, arms,  legs, gait disturbance, visual field changes.   PAST MEDICAL HISTORY:  1. History of transient aphasia and headache.  2. Hypertension.  3. Hypercholesterolemia.  4. History of myocardial infarction in 1992.  5. History of appendectomy.  6. History of throat cancer, status post radiation therapy.  7. History of basal cell carcinoma that was resected, right forehead.  8. Inguinal hernia repair.   MEDICATIONS:  1. Lisinopril 20 mg daily.  2. Lopressor 50 mg  daily.  3. Lovastatin 20 mg daily.  4. Enteric coated aspirin 325 mg daily.  5. Vitamin E supplementation.  6. Multivitamins.  7. Vitamin C supplementation.   ALLERGIES:  No known drug allergies.   HABITS:  Does not currently smoke or drink.   SOCIAL HISTORY:  This patient lives in the [place] area, is married, has  one son age 80, who is alive and well.   FAMILY HISTORY:  Mother died at age 73 of hypertension.  Father died of  stroke at age 48.  Sister had a stroke in her 41s.  Brother who is alive and  well.   REVIEW OF SYSTEMS:  Notable for some chills today.  No fevers.  The patient  did have headache.  Does have occasional headache in the past, but this is  rare.  Never had speech problems with headache  previously.  The patient did  have some nausea, no vomiting today.  Denies chest pain, shortness of  breath.  Denies problems controlling the bowels or the bladder, black-out  episodes.  The patient was dizzy a few days ago, not dizzy today.   PHYSICAL EXAMINATION:  VITAL SIGNS:  Blood pressure 191/114, heart rate 74,  respiratory rate 28, temperature afebrile.  GENERAL:  This patient is a fairly well-developed white male who is alert,  cooperative at the time of examination.  HEENT:  The patient's head is atraumatic.  Eyes:  Pupils equal, round,  reactive to light.  Disks are flat bilaterally.  NECK:  Supple, no carotid bruits noted.  RESPIRATORY:  Clear.  CARDIOVASCULAR:  Regular rate and rhythm, no obvious murmurs or rubs noted.  ABDOMEN:  Soft, nontender abdomen, no organomegaly or tenderness is noted.  EXTREMITIES:  Without significant edema.  NEUROLOGIC:  Cranial nerves as above.  Facial symmetry is present.  The  patient has good sensation of face to pinprick and soft touch bilaterally.  He has good strength of the facial muscles and the muscles of the head,  turning, shoulder shrug bilaterally.  Speech is well annunciated, not  aphasic at this point.  The  patient has good repetition and good naming.  Again, extraocular movements are full, visual fields are full to double  simultaneous stimulation.  Motor testing reveals 5/5 strength, good  symmetric motor tone is noted throughout.  Mild asterixis is seen with the  arms outstretched.  The patient has good symmetry to pinprick, soft touch,  vibratory touch sensation throughout.  Has good finger-nose-finger and toe-  to-finger bilaterally.  The patient was not ambulated.  Deep tendon reflexes  are symmetric and normal, toes are downgoing bilaterally.  Again, no upper  extremity drift was seen.   LABORATORY DATA:  Notable for sodium of 140, potassium 3.8, chloride 107,  BUN 21, glucose 108, hematocrit 42, hemoglobin 14.3, creatinine 1.1, INR 1.  White count 5.8, MCV 92.8, platelets 169.  CT scan of the head is as above.  EKG reveals normal sinus rhythm, normal EKG.  Heart rate was 81.  Chest x-  ray is pending.   IMPRESSION:  1. Onset of headache and dysnomia/aphasia, transient.  Rule out transient     ischemic attack, rule out hypertensive encephalopathy.  2. History of coronary artery disease.  3. Hypercholesterolemia.   This patient just had a transient event of headache and speech disturbance.  He is at or near baseline currently.  I will admit this patient for  evaluation of possible transient ischemic attack, but given the significant  elevation of blood pressure, hypertensive encephalopathy is also a  possibility.   PLAN:  1. Admission to [facility].  2. MRI of the brain.  3. MR angiogram of the extracranial and intracranial vessels.  4. Consider a 2-D echocardiogram.  5. Aspirin therapy for now.  6. Intravenous fluid hydration.  7. We will follow his clinical course while in house.   The patient has no prior history of headache associated with speech  disturbance, but migraine is also in the differential for the above event.                                                Marlan Palau, M.D.    CKW/MEDQ  D:  08/01/2003  T:  08/01/2003  Job:  [id number]   cc:   Anna Genre. Little, M.D.  [address]  [place]  [place] [address]  Fax: [phone]

## 2010-09-16 ENCOUNTER — Encounter: Payer: Self-pay | Admitting: *Deleted

## 2010-09-16 DIAGNOSIS — Z87442 Personal history of urinary calculi: Secondary | ICD-10-CM | POA: Insufficient documentation

## 2010-09-16 DIAGNOSIS — Z8673 Personal history of transient ischemic attack (TIA), and cerebral infarction without residual deficits: Secondary | ICD-10-CM | POA: Insufficient documentation

## 2010-09-16 DIAGNOSIS — E785 Hyperlipidemia, unspecified: Secondary | ICD-10-CM | POA: Insufficient documentation

## 2010-09-16 DIAGNOSIS — N529 Male erectile dysfunction, unspecified: Secondary | ICD-10-CM | POA: Insufficient documentation

## 2010-09-16 DIAGNOSIS — I674 Hypertensive encephalopathy: Secondary | ICD-10-CM | POA: Insufficient documentation

## 2010-09-16 DIAGNOSIS — C140 Malignant neoplasm of pharynx, unspecified: Secondary | ICD-10-CM | POA: Insufficient documentation

## 2010-09-26 ENCOUNTER — Ambulatory Visit (INDEPENDENT_AMBULATORY_CARE_PROVIDER_SITE_OTHER): Payer: Medicare Other | Admitting: Cardiology

## 2010-09-26 ENCOUNTER — Encounter: Payer: Self-pay | Admitting: Cardiology

## 2010-09-26 DIAGNOSIS — Z8774 Personal history of (corrected) congenital malformations of heart and circulatory system: Secondary | ICD-10-CM

## 2010-09-26 DIAGNOSIS — Z9889 Other specified postprocedural states: Secondary | ICD-10-CM

## 2010-09-26 DIAGNOSIS — I2510 Atherosclerotic heart disease of native coronary artery without angina pectoris: Secondary | ICD-10-CM

## 2010-09-26 DIAGNOSIS — G4730 Sleep apnea, unspecified: Secondary | ICD-10-CM

## 2010-09-26 DIAGNOSIS — I10 Essential (primary) hypertension: Secondary | ICD-10-CM

## 2010-09-26 DIAGNOSIS — I609 Nontraumatic subarachnoid hemorrhage, unspecified: Secondary | ICD-10-CM

## 2010-09-26 NOTE — Patient Instructions (Signed)
Be sure to take your medications as prescribed.  Monitor your blood pressure closely.  If your blood pressure runs high you can take an extra 10 mg of Benicar.  I will see you back in 6 months.

## 2010-09-27 DIAGNOSIS — I2510 Atherosclerotic heart disease of native coronary artery without angina pectoris: Secondary | ICD-10-CM | POA: Insufficient documentation

## 2010-09-27 DIAGNOSIS — I10 Essential (primary) hypertension: Secondary | ICD-10-CM | POA: Insufficient documentation

## 2010-09-27 DIAGNOSIS — I609 Nontraumatic subarachnoid hemorrhage, unspecified: Secondary | ICD-10-CM | POA: Insufficient documentation

## 2010-09-27 DIAGNOSIS — Z8774 Personal history of (corrected) congenital malformations of heart and circulatory system: Secondary | ICD-10-CM | POA: Insufficient documentation

## 2010-09-27 NOTE — Assessment & Plan Note (Signed)
His blood pressure appears to be under fairly good control. I have stressed the need to take his medications as directed. I've instructed him to take an extra 10 mg of Benicar if he has an elevated blood pressure reading. He needs to continue with sodium restriction.

## 2010-09-27 NOTE — Assessment & Plan Note (Signed)
He was asymptomatic. His last nuclear stress test in 2007 was normal. We will continue with risk factor modification.

## 2010-09-27 NOTE — Progress Notes (Signed)
Benjamin Park Date of Birth: 1940-10-12   History of Present Illness: Benjamin Park is seen today for followup. He reports that there've been a few episodes where his blood pressure would get real high and he would complain of a headache. He reports that in general his blood pressure has been under good control. 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 His wife reports that he sometimes plays with his medications and doesn't take them as ordered.  Current Outpatient Prescriptions on File Prior to Visit  Medication Sig Dispense Refill  . aspirin 81 MG tablet Take 81 mg by mouth daily.        . chlorthalidone (HYGROTON) 25 MG tablet Take 25 mg by mouth daily. Taking 1/2 daily       . fish oil-omega-3 fatty acids 1000 MG capsule Take by mouth daily.        Marland Kitchen GLUCOSAMINE PO Take by mouth daily.        Marland Kitchen labetalol (NORMODYNE) 200 MG tablet Take 1/2 tablet twice a day  90 tablet  3  . levothyroxine (SYNTHROID, LEVOTHROID) 88 MCG tablet Take 88 mcg by mouth daily.        Marland Kitchen lovastatin (MEVACOR) 40 MG tablet Take 40 mg by mouth at bedtime.        . Multiple Vitamin (MULTIVITAMIN) tablet Take 1 tablet by mouth daily.        . Olmesartan Medoxomil (BENICAR PO) Take by mouth. Taking 10mg  daily         No Known Allergies  Past Medical History  Diagnosis Date  . Hypertension   . Hx of transient ischemic attack (TIA)   . Hyperlipidemia   . Coronary artery disease   . Throat cancer     radiation therapy  . Hypertensive encephalopathy   . ED (erectile dysfunction)   . Personal history of kidney stones   . Subarachnoid hemorrhage   . Sleep apnea     on CPAP    Past Surgical History  Procedure Date  . Angioplasty 1993    LAD  . Cardiac catheterization   . Coronary angioplasty   . Basal cell cancer     head  . Hernia repair   . Appendectomy   . Patent foramen ovale closure 2008    History  Smoking status  .  Former Smoker  Smokeless tobacco  . Not on file    History  Alcohol Use     Family History  Problem Relation Age of Onset  . Heart failure Father   . Heart attack Brother   . Anemia Brother 70    mi    Review of Systems: The review of systems is positive for a chronic cough. This is worse after eating or drinking particularly after drinking hot coffee. He notes that Dr. Su Hilt did a chest x-ray that looked okay. His wife notices that he has an abnormal gait and tends to bounce on his toes. All other systems were reviewed and are negative.  Physical Exam: BP 138/86  Pulse 60  Ht 6' (1.829 m)  Wt 179 lb 3.2 oz (81.285 kg)  BMI 24.30 kg/m2 He is a pleasant white male in no acute distress. He is normocephalic, atraumatic. Pupils are equal round and reactive. Oropharynx is clear. Sclera clear. Neck is supple without JVD, adenopathy, thyromegaly, or bruits. Lungs are clear. Cardiac damage is a regular rate and  rhythm without gallop, murmur, or click. Abdomen is soft and nontender without masses or bruits. He has no edema. Pedal pulses are good. He is alert oriented x3. He still has a mild dysarthria. His exam is nonfocal. LABORATORY DATA:   Assessment / Plan:

## 2010-10-03 ENCOUNTER — Telehealth: Payer: Self-pay | Admitting: Cardiology

## 2010-10-03 ENCOUNTER — Emergency Department (HOSPITAL_COMMUNITY): Payer: Medicare Other

## 2010-10-03 ENCOUNTER — Inpatient Hospital Stay (HOSPITAL_COMMUNITY)
Admission: EM | Admit: 2010-10-03 | Discharge: 2010-10-05 | DRG: 069 | Disposition: A | Discharge status: Home / Self Care | Payer: Medicare Other | Attending: Internal Medicine | Admitting: Internal Medicine

## 2010-10-03 DIAGNOSIS — I517 Cardiomegaly: Secondary | ICD-10-CM

## 2010-10-03 DIAGNOSIS — I2510 Atherosclerotic heart disease of native coronary artery without angina pectoris: Secondary | ICD-10-CM | POA: Diagnosis present

## 2010-10-03 DIAGNOSIS — G459 Transient cerebral ischemic attack, unspecified: Principal | ICD-10-CM | POA: Diagnosis present

## 2010-10-03 DIAGNOSIS — E039 Hypothyroidism, unspecified: Secondary | ICD-10-CM | POA: Diagnosis present

## 2010-10-03 DIAGNOSIS — E785 Hyperlipidemia, unspecified: Secondary | ICD-10-CM | POA: Diagnosis present

## 2010-10-03 DIAGNOSIS — Z8673 Personal history of transient ischemic attack (TIA), and cerebral infarction without residual deficits: Secondary | ICD-10-CM

## 2010-10-03 DIAGNOSIS — E876 Hypokalemia: Secondary | ICD-10-CM | POA: Diagnosis present

## 2010-10-03 DIAGNOSIS — Z87891 Personal history of nicotine dependence: Secondary | ICD-10-CM

## 2010-10-03 DIAGNOSIS — I252 Old myocardial infarction: Secondary | ICD-10-CM

## 2010-10-03 DIAGNOSIS — I10 Essential (primary) hypertension: Secondary | ICD-10-CM | POA: Diagnosis present

## 2010-10-03 DIAGNOSIS — Z7902 Long term (current) use of antithrombotics/antiplatelets: Secondary | ICD-10-CM

## 2010-10-03 DIAGNOSIS — E7800 Pure hypercholesterolemia, unspecified: Secondary | ICD-10-CM | POA: Diagnosis present

## 2010-10-03 LAB — COMPREHENSIVE METABOLIC PANEL
ALT: 17 U/L (ref 0–53)
ALT: 16 U/L (ref 0–53)
ALT: 17 U/L (ref 0–53)
AST: 20 U/L (ref 0–37)
AST: 20 U/L (ref 0–37)
AST: 19 U/L (ref 0–37)
Albumin: 3.5 g/dL (ref 3.5–5.2)
Albumin: 3.7 g/dL (ref 3.5–5.2)
Alkaline Phosphatase: 78 U/L (ref 39–117)
Alkaline Phosphatase: 78 U/L (ref 39–117)
Alkaline Phosphatase: 83 U/L (ref 39–117)
BUN: 16 mg/dL (ref 6–23)
CO2: 30 mEq/L (ref 19–32)
CO2: 29 mEq/L (ref 19–32)
Calcium: 9.2 mg/dL (ref 8.4–10.5)
Chloride: 107 mEq/L (ref 96–112)
Chloride: 101 mEq/L (ref 96–112)
Creatinine, Ser: 0.94 mg/dL (ref 0.50–1.35)
GFR calc Af Amer: >60 mL/min (ref >60)
GFR calc Af Amer: >60 mL/min (ref >60)
GFR calc non Af Amer: >60 mL/min (ref >60)
Glucose, Bld: 101 mg/dL — ABNORMAL HIGH (ref 70–99)
Glucose, Bld: 170 mg/dL — ABNORMAL HIGH (ref 70–99)
Potassium: 4.2 mEq/L (ref 3.5–5.1)
Potassium: 4 mEq/L (ref 3.5–5.1)
Potassium: 3.3 mEq/L — ABNORMAL LOW (ref 3.5–5.1)
Sodium: 142 mEq/L (ref 135–145)
Sodium: 140 mEq/L (ref 135–145)
Total Bilirubin: 0.3 mg/dL (ref 0.3–1.2)
Total Bilirubin: 0.4 mg/dL (ref 0.3–1.2)
Total Protein: 6.9 g/dL (ref 6.0–8.3)
Total Protein: 7.1 g/dL (ref 6.0–8.3)

## 2010-10-03 LAB — CBC
HCT: 41.3 % (ref 39.0–52.0)
HCT: 41.2 % (ref 39.0–52.0)
Hemoglobin: 14.6 g/dL (ref 13.0–17.0)
MCH: 33 pg (ref 26.0–34.0)
MCHC: 35.6 g/dL (ref 30.0–36.0)
MCHC: 35.4 g/dL (ref 30.0–36.0)
MCV: 93 fL (ref 78.0–100.0)
Platelets: 150 10*3/uL (ref 150–400)
RBC: 4.43 MIL/uL (ref 4.22–5.81)
RDW: 12.8 % (ref 11.5–15.5)
RDW: 12.7 % (ref 11.5–15.5)
WBC: 7.4 10*3/uL (ref 4.0–10.5)

## 2010-10-03 LAB — POCT I-STAT, CHEM 8
Calcium, Ion: 1.21 mmol/L (ref 1.12–1.32)
Creatinine, Ser: 1.2 mg/dL (ref 0.50–1.35)
Glucose, Bld: 103 mg/dL — ABNORMAL HIGH (ref 70–99)
Hemoglobin: 14.3 g/dL (ref 13.0–17.0)
Potassium: 4.1 mEq/L (ref 3.5–5.1)
TCO2: 27 mmol/L (ref 0–100)

## 2010-10-03 LAB — DIFFERENTIAL
Basophils Absolute: 0 10*3/uL (ref 0.0–0.1)
Basophils Relative: 0 % (ref 0–1)
Eosinophils Absolute: 0.3 10*3/uL (ref 0.0–0.7)
Eosinophils Relative: 5 % (ref 0–5)
Lymphocytes Relative: 14 % (ref 12–46)
Monocytes Absolute: 0.4 10*3/uL (ref 0.1–1.0)
Monocytes Absolute: 0.4 10*3/uL (ref 0.1–1.0)
Monocytes Relative: 6 % (ref 3–12)

## 2010-10-03 LAB — CK TOTAL AND CKMB (NOT AT ARMC)
CK, MB: 3.1 ng/mL (ref 0.3–4.0)
Relative Index: 2.7 — ABNORMAL HIGH (ref 0.0–2.5)

## 2010-10-03 LAB — URINALYSIS, ROUTINE W REFLEX MICROSCOPIC
Bilirubin Urine: NEGATIVE
Hgb urine dipstick: NEGATIVE
Ketones, ur: NEGATIVE mg/dL
Nitrite: NEGATIVE
pH: 7.5 (ref 5.0–8.0)

## 2010-10-03 LAB — TROPONIN I
Troponin I: <0.3 ng/mL (ref <0.30)
Troponin I: <0.3 ng/mL (ref <0.30)

## 2010-10-03 LAB — PROTIME-INR
INR: 0.99 (ref 0.00–1.49)
INR: 1 (ref 0.00–1.49)
Prothrombin Time: 13.4 seconds (ref 11.6–15.2)

## 2010-10-03 NOTE — Telephone Encounter (Signed)
Wife called stating Benjamin Park woke up this AM w/weakness on (L) side; BP elevated; took extra BP med about 1 hour ago; now BP 165/113. When walks is dragging foot. Advised to take him to ER as soon as possible. She will take him now.

## 2010-10-03 NOTE — Telephone Encounter (Signed)
Weakness in left leg and left arm.  bp is real high.  Patient has taken an extra bp pill and it went down for a little, but now 165/113.  Wants to be seen sooner.

## 2010-10-04 ENCOUNTER — Inpatient Hospital Stay (HOSPITAL_COMMUNITY): Payer: Medicare Other

## 2010-10-04 LAB — COMPREHENSIVE METABOLIC PANEL
ALT: 14 U/L (ref 0–53)
AST: 17 U/L (ref 0–37)
Alkaline Phosphatase: 69 U/L (ref 39–117)
CO2: 29 mEq/L (ref 19–32)
Calcium: 8.8 mg/dL (ref 8.4–10.5)
GFR calc Af Amer: >60 mL/min (ref >60)
GFR calc non Af Amer: >60 mL/min (ref >60)
Glucose, Bld: 95 mg/dL (ref 70–99)
Potassium: 3.2 mEq/L — ABNORMAL LOW (ref 3.5–5.1)
Sodium: 140 mEq/L (ref 135–145)
Total Protein: 6.2 g/dL (ref 6.0–8.3)

## 2010-10-04 LAB — LIPID PANEL
HDL: 33 mg/dL — ABNORMAL LOW (ref >39)
LDL Cholesterol: 73 mg/dL (ref 0–99)
Total CHOL/HDL Ratio: 4.2 RATIO

## 2010-10-04 LAB — CBC
HCT: 39.4 % (ref 39.0–52.0)
Hemoglobin: 13.7 g/dL (ref 13.0–17.0)
MCH: 32.3 pg (ref 26.0–34.0)
MCHC: 34.8 g/dL (ref 30.0–36.0)
RBC: 4.24 MIL/uL (ref 4.22–5.81)

## 2010-10-04 LAB — CARDIAC PANEL(CRET KIN+CKTOT+MB+TROPI)
CK, MB: 2.8 ng/mL (ref 0.3–4.0)
Relative Index: INVALID (ref 0.0–2.5)
Total CK: 109 U/L (ref 7–232)

## 2010-10-04 NOTE — Consult Note (Signed)
NAME:  Benjamin Park, [name]:  [id number]  MEDICAL RECORD NO.:  [id number]  LOCATION:  3009                         FACILITY:  [facility]  PHYSICIAN:  Levie Heritage, MD       DATE OF BIRTH:  1940/11/01  DATE OF CONSULTATION:  10/03/2010 DATE OF DISCHARGE:                                CONSULTATION   REFERRING PHYSICIAN:  Dr. Marylou Mccoy of the ER.  REASON FOR CONSULTATION:  Code stroke.  CHIEF COMPLAINT:  Left-sided weakness.  HISTORY OF PRESENT ILLNESS:  This patient is a 70 year old man with multiple comorbidities who was in his usual state of health around 9 o'clock this morning noted weakness of his left arm and left leg without any other additional neurological deficit.  He came to the [facility] ER and a CT scan of the head was performed that did not show any acute intracranial abnormality and his neurological deficit recovered back to completely normal baseline when he was examined.  PAST MEDICAL HISTORY: 1. Small subarachnoid hemorrhage in the past. 2. Hypertensive encephalopathy. 3. Hypercholesterolemia. 4. Dysphagia. 5. History of left anterior descending angioplasty. 6. History of TIA. 7. Hypokalemia. 8. 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 9. Hypothyroidism. 10.Hyperlipidemia. 11.Headaches.  SOCIAL HISTORY:  He is married, lives with his wife.  He quit smoking in 1990s.  No alcohol or illicit drug abuse.  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  FAMILY HISTORY:  Multiple family members with coronary artery disease.  ALLERGIES:  No known drug allergies.  REVIEW OF CLINICAL DATA:  I have reviewed his CT scan of the head performed today in the ER along with the previous brain imaging.  I do not see any clear new intracranial abnormality but official report is still pending.  LABORATORY DATA:  Unremarkable stroke panel except borderline low platelets and normal I-Stat, ACE as well.  CURRENT LIST OF  MEDICATIONS:  The patient stated that he takes multiple medications for the blood pressure and cholesterol and will provide the list but does not remember the exact dosages or the detail of the medication at this time.  REVIEW OF SYSTEMS:  Denies any chest pain.  Denies any shortness of breath.  Denies any sputum production.  Denies any recent fever.  Denies any recent rashes.  Denies any recent weight loss.  Denies any nausea, vomiting, diarrhea.  Denies any burning urination.  Denies any jaundice.  Rest of 10-organ review of system unremarkable  PHYSICAL EXAMINATION:  GENERAL:  Currently comfortably lying down on the bed. VITAL SIGNS:  Blood pressure is 168/88 mmHg, the pulse of 78 per minute. NEURO:  Awake, oriented x3 in no acute distress.  NIH stroke scale is zero at this point.  Bilateral pupils reactive to light and accommodation with no field cut noted.  Moves eyes to all direction. Face is symmetrical for sensation and strength testing.  Tongue is midline without atrophy or fasciculation.  Palate elevates symmetrically bilaterally. Motor examination reveals strength 5/5 without any deficit on the affected left side at this point.  Sensory examination reveals intact light touch sensation all over symmetrically normal. Gait was deferred.  There is  no dysmetria.  There is no dysarthria. There is no aphasia.  IMPRESSION:  A 70 year old man with multiple comorbidities had transient ischemic attack, possibly causing transient left arm and leg weakness that resolved within couple of hours. Due to complete resolution of the symptoms and the known history of subarachnoid hemorrhage, t-PA is not offered.  PLAN:  Suggest getting the patient's MRI and MRA of the brain. There is no blood products seen on the CT scan of the head, therefore, 81 mg aspirin can be given if there is no other Non-neurological contraindication.  Neurology will follow up the patient with you and will proceed  as the workup progresses.    ______________________________ Levie Heritage, MD     WS/MEDQ  D:  10/03/2010  T:  10/04/2010  Job:  [id number]  Electronically Signed by Levie Heritage MD on 10/04/2010 07:44:35 AM

## 2010-10-05 ENCOUNTER — Inpatient Hospital Stay (HOSPITAL_COMMUNITY): Payer: Medicare Other

## 2010-10-05 LAB — BASIC METABOLIC PANEL
CO2: 28 mEq/L (ref 19–32)
Calcium: 8.9 mg/dL (ref 8.4–10.5)
GFR calc non Af Amer: >60 mL/min (ref >60)
Glucose, Bld: 92 mg/dL (ref 70–99)
Potassium: 3.8 mEq/L (ref 3.5–5.1)
Sodium: 142 mEq/L (ref 135–145)

## 2010-10-06 NOTE — Discharge Summary (Signed)
NAME:  Benjamin Park, Benjamin Park NO.:  [id number]  MEDICAL RECORD NO.:  [id number]  LOCATION:  3009                         FACILITY:  [facility]  PHYSICIAN:  Mauro Kaufmann, MD         DATE OF BIRTH:  [birth year]  DATE OF ADMISSION:  10/03/2010 DATE OF DISCHARGE:  10/05/2010                              DISCHARGE SUMMARY   ADMISSION DIAGNOSES: 1. Transient ischemic attack versus cerebrovascular accident. 2. Hypertension. 3. Hyperlipidemia. 4. History of easy brushing. 5. History of subarachnoid hemorrhage.  DISCHARGE DIAGNOSES: 1. Transient ischemic attack.  Currently on aspirin. 2. Hypertension. 3. Hypothyroidism. 4. History of subarachnoid hemorrhage. 5. Hyperlipidemia.  TESTS PERFORMED IN THE HOSPITAL:  CT of the head with contrast on June 16 showed cerebral and cerebellar volume loss, mild periventricular and subcortical white matter hypodensities, most definite acute intracranial abnormality identified.  His speech study was done in the hospital, which showed that the patient had overt signs of aspiration with thin liquids, with recommended the patient on medical soft diet with nectar- thick liquids.  MRI of the head which showed progressive brain atrophy with acute chronic small vessel change in the brain chronic of normal midline cerebellar peduncles and corpus closure unchanged at least 2005.  This will not presenting an acute process.  X-RAYS:  MRI of the head showed no large to medium mass occlusion of proximal stenosis.  Mild atherosclerotic changes of more distal branch vessels.  PERTINENT LABS:  The patient had total cholesterol 138, platelets 161, HDL 33, LDL 73.  Cardiac enzymes x3 were negative.  BRIEF HISTORY AND PHYSICAL:  A 70 year old gentleman who came in with chief complaint of left arm and left leg weakness and also had some trouble walking.  The patient also had difficulty placing his hearing aid in the left ear to the symptoms for some  extremity numbness.  So the patient admitted and diagnosed TIA with enlarging excessive aphagia from right upper and lower extremity which was actually resolving.  BRIEF HOSPITAL COURSE:  The patient was seen by Neurology and there was a question of TIA versus CVA, so CAT scan of the head was done and they recommended to get an MRI of the brain with MRA which did not show any stroke.  At this time, the patient's medication has been changed to Plavix 75 mg p.o. daily.  Though the patient has a history of easy bruising and Neurology recommended the patient should continue on Plavix and if he continues starts to have bruising, he can switch back full dose aspirin.  The patient also will need outpatient PT, but no OT at this time.  Also, the patient has mild dysphagia and he is currently on modified dysphagia III diet with nectar-thick liquid with a recommendation for followup speech therapy as outpatient.  MEDICATIONS ON DISCHARGE: 1. Plavix 75 mg p.o. daily. 2. Fish oil over-the-counter 1 tablet every evening. 3. Glucosamine 1 tablet p.o. every evening. 4. Labetalol 100 mg p.o. b.i.d.. 5. Lovastatin 20 mg 2 tablets p.o. every evening. 6. Multiple vitamin tablet p.o. daily. 7. Olmesartan 20 mg p.o. daily. 8. Synthroid 18 mcg 1 tablet every morning.  Follow up with Dr.  Burton Apley as an outpatient. 9. Makes sure note that the patient has a history of hypertension.  He     has been having low blood pressure ,so he had low blood pressure     during the hospital stay.  We have stopped the patient's     chlorthalidone and the patient has a history of hypertensive     urgency in the past.  If the patient blood pressure starts to go     up, he needs to put back on back on the medication.  The patient     will be given instructions to check the blood pressure at least     three times a week and a blood pressure is elevated.  He should     call his primary care physician.  Also the  patient will be given recommendation not to go back to work until feels steady on his feet and as recommended by physical therapy.     Mauro Kaufmann, MD     GL/MEDQ  D:  10/05/2010  T:  10/05/2010  Job:  [id number]  cc:   Antony Madura, M.D.  Electronically Signed by Mauro Kaufmann  on 10/06/2010 07:58:52 AM

## 2010-10-11 ENCOUNTER — Ambulatory Visit: Payer: Medicare Other | Admitting: Physical Therapy

## 2010-10-18 ENCOUNTER — Ambulatory Visit: Payer: Medicare Other | Admitting: Physical Therapy

## 2010-12-06 NOTE — H&P (Signed)
NAME:  Benjamin Park, Benjamin Park                ACCOUNT NO.:  [id number]  MEDICAL RECORD NO.:  [id number]  LOCATION:  3009                         FACILITY:  [facility]  PHYSICIAN:  Layken Beg, DO         DATE OF BIRTH:  1940/04/17  DATE OF ADMISSION:  10/03/2010 DATE OF DISCHARGE:                             HISTORY & PHYSICAL   CHIEF COMPLAINT:  Left arm and leg weakness.  HISTORY OF PRESENT ILLNESS:  The patient is a 70 year old male presents with the above chief complaint who states about 9 a.m. he suddenly had trouble walking and he also had difficult placing his hearing aid in his left ear due to the symptoms except for some extremity numbness.  The patient has now nearly complete resolution of his symptoms.  Although as I examined the patient, his wife notes that he now has developed some word finding difficulty.  She states that he frequently does this when his blood pressure gets high.  PAST MEDICAL HISTORY:  Significant for: 1. History of subarachnoid hemorrhage. 2. Non-ST-segment elevation MI secondary to history of subarachnoid     hemorrhage. 3. Transient ischemic attack. 4. Hypertension. 5. Hypothyroidism. 6. History of squamous cell CA of throat.  MEDICATIONS AT HOME: 1. Chlorthalidone 25 mg one p.o. daily. 2. Fish oil 1 tablet p.o. q.p.m. 3. Glucosamine 1 tablet p.o. q.p.m. 4. Labetalol 100 mg one p.o. b.i.d. 5. Lovastatin 20 mg two p.o. q.p.m. 6. Multivitamin one p.o. q.p.m. 7. Olmesartan 20 mg one p.o. q.p.m. 8. Synthroid 88 mcg one p.o. q.a.m.  ALLERGIES:  To LISINOPRIL.  SOCIAL:  No tobacco, no alcohol, no recreational drug use.  FAMILY HISTORY:  Positive for coronary artery disease in father and brother.  REVIEW OF SYSTEMS:  CONSTITUTIONAL:  Negative for fever, negative for chills, positive for fatigue.  CNS:  Positive for left-sided weakness. Positive for numbness, positive for word finding difficulty.  No seizure.  No loss of consciousness.  ENT:  No nasal  congestion, no throat pain, no coryza.  CARDIOVASCULAR:  No chest pain, no palpitations, no orthopnea.  RESPIRATORY:  No cough, no shortness of breath, no wheezing.  GASTROINTESTINAL:  No nausea, no vomiting, no constipation, no diarrhea.  GENITOURINARY:  No dysuria, no hematuria, no urinary frequency.  RENAL:  No flank pain, no swelling, no pruritus. SKIN:  No rashes, no sores, or lesions.  HEMATOLOGICAL:  No easy bruising, no purpura, no clots.  LYMPH:  No lymphadenopathy.  No painful nodes.  No specific lymph swelling.  PSYCHIATRIC:  No anxiety.  No depression, no insomnia.  PHYSICAL EXAMINATION:  VITAL SIGNS:  Temperature 98.4, pulse 67, respiratory rate 16, blood pressure 127/89, and O2 sat 97% on room air. GENERAL:  The patient is awake and alert; however, now that he has some word finding difficulty.  He is remaining silent and then, he is allowing his wife to answer the questions.  The patient himself states that he feels fine and she admits to no problems and no word finding difficulties although clearly and is the case that the patient cannot come up with spontaneous speech.  She is able to name, simple objects such as paper and  pen.  According to wife again this is a symptom the patient displays occasionally when he has a high blood pressure. EYES:  Pupils, equal, round, reactive to light, and accommodation. External ocular movements bilaterally intact.  Sclerae nonicteric, noninjected. MOUTH:  Oral mucosa moist.  No lesions.  No sores.  Pharynx clear.  No erythema.  No exudate. NECK:  Negative for JVD.  Negative for thyromegaly, negative for lymphadenopathy. HEART:  Regular rate rhythm at 80 beats per minute without murmur, ectopy, or gallops.  No lateral PMI.  No thrills. LUNGS:  Clear to auscultation bilaterally without wheezes, rales, or rhonchi.  No increased  work of breathing.  No tactile fremitus. ABDOMEN:  Soft, nontender, nondistended.  Positive bowel sounds.   No hepatosplenomegaly.  No hernias palpated. EXTREMITIES:  Negative for cyanosis, clubbing, or edema.  Positive for diminished dorsalis pedis and popliteal pulses.  No carotid bruits bilaterally. NEUROLOGIC:  Cranial nerves II-XII grossly intact.  Motor and sensory intact.  The patient has notable expressive aphasia, word finding difficulty.  There is no real dysarthria.  Proprioception is intact. The patient's right upper extremity has 3/5 weakness and right lower extremity has 4/5 weakness, and on the left side muscle strength is 5/5.  LABORATORY:  Ionized calcium is 1.21, CO2 is 27.  WBC 6.0, hemoglobin 14.3, hematocrit 42.0, platelets 148.  Sodium 144, potassium 4.1, chloride 106, CO2 of 30, BUN 50, creatinine 1.20, glucose 103, CO2 of 13.3, INR 0.99, PTT is 34, alk phos 78, AST 20, ALT 13, total protein 7.0.  CK is 119, MB is 3.22, troponin is less than 0.30, albumin 3.5, calcium 9.2.  CT head shows cerebral and cerebellar volume loss, mild periventricular and subcortical white matter hyperdensities.  No definite acute abnormality.  Neuro saw the patient and was evaluated that there should be no code stroke called on this patient and recommended TIA workup.  ASSESSMENT AND PLAN: 1. Transient ischemic attack with enlarging expressive aphasia along     with right upper and lower extremity weakness, which is resolving. 2. Hypertension, uncontrolled. 3. Hyperlipidemia. 4. Known coronary artery disease. 5. History of subarachnoid hemorrhage.  PLAN:  TIA workup including admission to telemetry, rule out for MI, echocardiogram, bilateral Dopplers, followup CT of the head, aspirin.  I spent 50 minutes on this admission.          ______________________________ Fran Lowes, DO     AS/MEDQ  D:  10/03/2010  T:  10/04/2010  Job:  [id number]  cc:   Peter M. [place], M.D.  Electronically Signed by Fran Lowes DO on 12/06/2010 01:53:19 PM

## 2011-02-23 ENCOUNTER — Inpatient Hospital Stay (HOSPITAL_COMMUNITY)
Admission: EM | Admit: 2011-02-23 | Discharge: 2011-02-26 | DRG: 078 | Disposition: A | Discharge status: Home / Self Care | Payer: Medicare Other | Attending: Internal Medicine | Admitting: Internal Medicine

## 2011-02-23 ENCOUNTER — Encounter (HOSPITAL_COMMUNITY): Payer: Self-pay | Admitting: Radiology

## 2011-02-23 ENCOUNTER — Emergency Department (HOSPITAL_COMMUNITY): Payer: Medicare Other

## 2011-02-23 DIAGNOSIS — E785 Hyperlipidemia, unspecified: Secondary | ICD-10-CM

## 2011-02-23 DIAGNOSIS — N179 Acute kidney failure, unspecified: Secondary | ICD-10-CM

## 2011-02-23 DIAGNOSIS — G4733 Obstructive sleep apnea (adult) (pediatric): Secondary | ICD-10-CM | POA: Diagnosis present

## 2011-02-23 DIAGNOSIS — Z79899 Other long term (current) drug therapy: Secondary | ICD-10-CM

## 2011-02-23 DIAGNOSIS — N529 Male erectile dysfunction, unspecified: Secondary | ICD-10-CM

## 2011-02-23 DIAGNOSIS — E039 Hypothyroidism, unspecified: Secondary | ICD-10-CM | POA: Diagnosis present

## 2011-02-23 DIAGNOSIS — I609 Nontraumatic subarachnoid hemorrhage, unspecified: Secondary | ICD-10-CM

## 2011-02-23 DIAGNOSIS — I2510 Atherosclerotic heart disease of native coronary artery without angina pectoris: Secondary | ICD-10-CM

## 2011-02-23 DIAGNOSIS — R5381 Other malaise: Secondary | ICD-10-CM | POA: Diagnosis present

## 2011-02-23 DIAGNOSIS — Z8774 Personal history of (corrected) congenital malformations of heart and circulatory system: Secondary | ICD-10-CM

## 2011-02-23 DIAGNOSIS — Z87891 Personal history of nicotine dependence: Secondary | ICD-10-CM

## 2011-02-23 DIAGNOSIS — Z923 Personal history of irradiation: Secondary | ICD-10-CM

## 2011-02-23 DIAGNOSIS — C140 Malignant neoplasm of pharynx, unspecified: Secondary | ICD-10-CM

## 2011-02-23 DIAGNOSIS — R1310 Dysphagia, unspecified: Secondary | ICD-10-CM | POA: Diagnosis present

## 2011-02-23 DIAGNOSIS — I679 Cerebrovascular disease, unspecified: Secondary | ICD-10-CM | POA: Diagnosis present

## 2011-02-23 DIAGNOSIS — G4730 Sleep apnea, unspecified: Secondary | ICD-10-CM

## 2011-02-23 DIAGNOSIS — I674 Hypertensive encephalopathy: Secondary | ICD-10-CM

## 2011-02-23 DIAGNOSIS — I10 Essential (primary) hypertension: Secondary | ICD-10-CM

## 2011-02-23 DIAGNOSIS — Z7982 Long term (current) use of aspirin: Secondary | ICD-10-CM

## 2011-02-23 DIAGNOSIS — Z9861 Coronary angioplasty status: Secondary | ICD-10-CM

## 2011-02-23 DIAGNOSIS — F05 Delirium due to known physiological condition: Secondary | ICD-10-CM

## 2011-02-23 DIAGNOSIS — Z8673 Personal history of transient ischemic attack (TIA), and cerebral infarction without residual deficits: Secondary | ICD-10-CM

## 2011-02-23 DIAGNOSIS — Z85819 Personal history of malignant neoplasm of unspecified site of lip, oral cavity, and pharynx: Secondary | ICD-10-CM

## 2011-02-23 DIAGNOSIS — Z87442 Personal history of urinary calculi: Secondary | ICD-10-CM

## 2011-02-23 DIAGNOSIS — G459 Transient cerebral ischemic attack, unspecified: Secondary | ICD-10-CM

## 2011-02-23 HISTORY — DX: Cerebral infarction, unspecified: I63.9

## 2011-02-23 HISTORY — DX: Headache: R51

## 2011-02-23 HISTORY — DX: Acute kidney failure, unspecified: N17.9

## 2011-02-23 HISTORY — DX: Acute myocardial infarction, unspecified: I21.9

## 2011-02-23 LAB — URINALYSIS, ROUTINE W REFLEX MICROSCOPIC
Bilirubin Urine: NEGATIVE
Glucose, UA: NEGATIVE mg/dL
Hgb urine dipstick: NEGATIVE
Ketones, ur: NEGATIVE mg/dL
Leukocytes, UA: NEGATIVE
Nitrite: NEGATIVE
Protein, ur: 30 mg/dL — AB
Specific Gravity, Urine: 1.012 (ref 1.005–1.030)
Urobilinogen, UA: 0.2 mg/dL (ref 0.0–1.0)
pH: 8 (ref 5.0–8.0)

## 2011-02-23 LAB — TROPONIN I: Troponin I: <0.3 ng/mL (ref <0.30)

## 2011-02-23 LAB — POCT I-STAT, CHEM 8
BUN: 28 mg/dL — ABNORMAL HIGH (ref 6–23)
Calcium, Ion: 1.07 mmol/L — ABNORMAL LOW (ref 1.12–1.32)
Chloride: 105 mEq/L (ref 96–112)
Creatinine, Ser: 1.4 mg/dL — ABNORMAL HIGH (ref 0.50–1.35)
Glucose, Bld: 108 mg/dL — ABNORMAL HIGH (ref 70–99)
HCT: 48 % (ref 39.0–52.0)
Hemoglobin: 16.3 g/dL (ref 13.0–17.0)
Potassium: 3.9 mEq/L (ref 3.5–5.1)
Sodium: 140 mEq/L (ref 135–145)
TCO2: 27 mmol/L (ref 0–100)

## 2011-02-23 LAB — COMPREHENSIVE METABOLIC PANEL WITH GFR
BUN: 23 mg/dL (ref 6–23)
CO2: 27 meq/L (ref 19–32)
Calcium: 9.6 mg/dL (ref 8.4–10.5)
Creatinine, Ser: 1.08 mg/dL (ref 0.50–1.35)
GFR calc Af Amer: 79 mL/min — ABNORMAL LOW (ref >90)
GFR calc non Af Amer: 68 mL/min — ABNORMAL LOW (ref >90)
Glucose, Bld: 108 mg/dL — ABNORMAL HIGH (ref 70–99)
Total Bilirubin: 0.3 mg/dL (ref 0.3–1.2)

## 2011-02-23 LAB — URINE MICROSCOPIC-ADD ON

## 2011-02-23 LAB — COMPREHENSIVE METABOLIC PANEL
ALT: 19 U/L (ref 0–53)
AST: 37 U/L (ref 0–37)
Albumin: 4 g/dL (ref 3.5–5.2)
Alkaline Phosphatase: 74 U/L (ref 39–117)
Chloride: 101 mEq/L (ref 96–112)
Potassium: 3.8 mEq/L (ref 3.5–5.1)
Sodium: 139 mEq/L (ref 135–145)
Total Protein: 7.8 g/dL (ref 6.0–8.3)

## 2011-02-23 LAB — DIFFERENTIAL
Basophils Absolute: 0 10*3/uL (ref 0.0–0.1)
Basophils Relative: 1 % (ref 0–1)
Eosinophils Absolute: 0.4 10*3/uL (ref 0.0–0.7)
Eosinophils Relative: 6 % — ABNORMAL HIGH (ref 0–5)
Lymphocytes Relative: 18 % (ref 12–46)
Lymphs Abs: 1.1 K/uL (ref 0.7–4.0)
Monocytes Absolute: 0.5 K/uL (ref 0.1–1.0)
Monocytes Relative: 8 % (ref 3–12)
Neutro Abs: 4.2 10*3/uL (ref 1.7–7.7)
Neutrophils Relative %: 67 % (ref 43–77)

## 2011-02-23 LAB — APTT: aPTT: 35 seconds (ref 24–37)

## 2011-02-23 LAB — CK TOTAL AND CKMB (NOT AT ARMC)
CK, MB: 4.3 ng/mL — ABNORMAL HIGH (ref 0.3–4.0)
Relative Index: 3 — ABNORMAL HIGH (ref 0.0–2.5)
Total CK: 145 U/L (ref 7–232)

## 2011-02-23 LAB — PROTIME-INR
INR: 0.96 (ref 0.00–1.49)
Prothrombin Time: 13 seconds (ref 11.6–15.2)

## 2011-02-23 LAB — CBC
HCT: 46.1 % (ref 39.0–52.0)
Hemoglobin: 16.4 g/dL (ref 13.0–17.0)
MCH: 33.5 pg (ref 26.0–34.0)
MCHC: 35.6 g/dL (ref 30.0–36.0)
MCV: 94.3 fL (ref 78.0–100.0)
Platelets: 147 10*3/uL — ABNORMAL LOW (ref 150–400)
RBC: 4.89 MIL/uL (ref 4.22–5.81)
RDW: 13.3 % (ref 11.5–15.5)
WBC: 6.2 K/uL (ref 4.0–10.5)

## 2011-02-23 MED ORDER — AMLODIPINE BESYLATE 5 MG PO TABS
5.0000 mg | ORAL_TABLET | Freq: Every day | ORAL | Status: DC
  Administered 2011-02-25 – 2011-02-26 (×2): 5 mg via ORAL
  Filled 2011-02-23 (×3): qty 1

## 2011-02-23 MED ORDER — SIMVASTATIN 20 MG PO TABS
20.0000 mg | ORAL_TABLET | Freq: Every day | ORAL | Status: DC
  Administered 2011-02-24 – 2011-02-26 (×3): 20 mg via ORAL
  Filled 2011-02-23 (×3): qty 1

## 2011-02-23 MED ORDER — SODIUM CHLORIDE 0.9 % IV SOLN
INTRAVENOUS | Status: DC
  Administered 2011-02-24: 02:00:00 via INTRAVENOUS

## 2011-02-23 MED ORDER — ASPIRIN 81 MG PO CHEW: 324.0000 mg | CHEWABLE_TABLET | ORAL | Status: AC

## 2011-02-23 MED ORDER — LABETALOL HCL 100 MG PO TABS
100.0000 mg | ORAL_TABLET | Freq: Two times a day (BID) | ORAL | Status: DC
  Administered 2011-02-25 – 2011-02-26 (×3): 100 mg via ORAL
  Filled 2011-02-23 (×7): qty 1

## 2011-02-23 MED ORDER — LORAZEPAM 1 MG PO TABS: 0.5000 mg | ORAL_TABLET | Freq: Once | ORAL | Status: DC

## 2011-02-23 MED ORDER — OLMESARTAN MEDOXOMIL 20 MG PO TABS
20.0000 mg | ORAL_TABLET | Freq: Every day | ORAL | Status: DC
  Administered 2011-02-25 – 2011-02-26 (×2): 20 mg via ORAL
  Filled 2011-02-23 (×3): qty 1

## 2011-02-23 MED ORDER — LEVOTHYROXINE SODIUM 88 MCG PO TABS
88.0000 ug | ORAL_TABLET | Freq: Every day | ORAL | Status: DC
  Administered 2011-02-25 – 2011-02-26 (×2): 88 ug via ORAL
  Filled 2011-02-23 (×3): qty 1

## 2011-02-23 MED ORDER — ONDANSETRON HCL 4 MG/2ML IJ SOLN
INTRAMUSCULAR | Status: AC
  Administered 2011-02-23: 4 mg
  Filled 2011-02-23: qty 2

## 2011-02-23 MED ORDER — ONDANSETRON 4 MG PO TBDP: 4.0000 mg | ORAL_TABLET | Freq: Once | ORAL | Status: DC

## 2011-02-23 MED ORDER — LABETALOL HCL 5 MG/ML IV SOLN
20.0000 mg | Freq: Once | INTRAVENOUS | Status: AC
  Administered 2011-02-23: 20 mg via INTRAVENOUS

## 2011-02-23 MED ORDER — ASPIRIN 300 MG RE SUPP
300.0000 mg | RECTAL | Status: AC
  Administered 2011-02-24: 300 mg via RECTAL

## 2011-02-23 MED ORDER — LABETALOL HCL 5 MG/ML IV SOLN
20.0000 mg | INTRAVENOUS | Status: DC | PRN
  Filled 2011-02-23 (×2): qty 4

## 2011-02-23 MED ORDER — ONDANSETRON HCL 4 MG/2ML IJ SOLN
4.0000 mg | Freq: Once | INTRAMUSCULAR | Status: AC
  Administered 2011-02-23: 19:00:00 via INTRAVENOUS

## 2011-02-23 MED ORDER — LORAZEPAM 2 MG/ML IJ SOLN
INTRAMUSCULAR | Status: AC
  Administered 2011-02-23: 0.5 mg via INTRAVENOUS
  Filled 2011-02-23: qty 1

## 2011-02-23 MED ORDER — CHLORTHALIDONE 25 MG PO TABS
25.0000 mg | ORAL_TABLET | Freq: Every day | ORAL | Status: DC
  Administered 2011-02-25 – 2011-02-26 (×2): 25 mg via ORAL
  Filled 2011-02-23 (×3): qty 1

## 2011-02-23 MED ORDER — ONDANSETRON HCL 4 MG/2ML IJ SOLN
INTRAMUSCULAR | Status: AC
  Filled 2011-02-23: qty 2

## 2011-02-23 MED ORDER — ONDANSETRON HCL 4 MG/2ML IJ SOLN: 4.0000 mg | Freq: Three times a day (TID) | INTRAMUSCULAR | Status: DC | PRN

## 2011-02-23 MED ORDER — LABETALOL HCL 5 MG/ML IV SOLN
10.0000 mg | Freq: Once | INTRAVENOUS | Status: AC
  Administered 2011-02-23: 10 mg via INTRAVENOUS
  Filled 2011-02-23: qty 4

## 2011-02-23 MED ORDER — LORAZEPAM 2 MG/ML IJ SOLN
0.5000 mg | Freq: Once | INTRAMUSCULAR | Status: AC
  Administered 2011-02-23: 0.5 mg via INTRAVENOUS

## 2011-02-23 MED ORDER — SODIUM CHLORIDE 0.9 % IV SOLN: 250.0000 mL | INTRAVENOUS | Status: DC | PRN

## 2011-02-23 NOTE — ED Notes (Signed)
Pt restless. Attempting to pull out iv, ecg lines and pull off covers. IV to LAC secured and site wrapped with kurlex.

## 2011-02-23 NOTE — ED Notes (Signed)
EKG done and given to Dr. Oletta Lamas along with OLD EKG.

## 2011-02-23 NOTE — H&P (Signed)
Name: Benjamin Park MRN: [id number] DOB: November 09, 1940  LOS: 0  CRITICAL CARE ADMISSION NOTE  History of Present Illness: 70 y/o male with known hypertension, prior TIA's and a prior subarachnoid hemorrhage presented to the [facility] ED on 12/6 with confusion. He had been feeling well recently and was last seen in his usual state of health by his wife around 1700.  She left the house and received a call from him at 1755 saying, "Come home, I'm sick."  He could not elaborate more and just kept saying, "I'm sick".  She found him to be confused but able to walk.  His wife brought him to the ED where he vomited in triage and could not speak.  Code stroke was called.  Head CT was without acute change.  Neurology felt that his symptoms were either due to a TIA or hypertensive encephalopathy.  He was to be admitted to the SD[facility] on [facility], but [facility] requested our evaluation for ICU placement.  In the ED he was confused but able to follow simple commands.  He received two doses of labetalol and one dose of ativan 0.5mg .   Lines / Drains:   Cultures / Sepsis markers:   Antibiotics:   Tests / Events: 12/6 CT Head: NAICP, stable chronic atrophy and small vessel ischaemic changes     Past Medical History  Diagnosis Date  . Hypertension   . Hx of transient ischemic attack (TIA)   . Hyperlipidemia   . Coronary artery disease   . Throat cancer     radiation therapy  . Hypertensive encephalopathy   . ED (erectile dysfunction)   . Personal history of kidney stones   . Subarachnoid hemorrhage   . Sleep apnea     on CPAP   Past Surgical History  Procedure Date  . Angioplasty 1993    LAD  . Cardiac catheterization   . Coronary angioplasty   . Basal cell cancer     head  . Hernia repair   . Appendectomy   . Patent foramen ovale closure 2008   Prior to Admission medications   Medication Sig Start Date End Date Taking? Authorizing Provider  amLODipine (NORVASC) 5 MG tablet Take 5 mg by mouth daily.      Yes Historical Provider, MD  aspirin 325 MG tablet Take 325 mg by mouth daily.     Yes Historical Provider, MD  chlorthalidone (HYGROTON) 25 MG tablet Take 25 mg by mouth daily. Taking 1/2 daily    Yes Historical Provider, MD  fish oil-omega-3 fatty acids 1000 MG capsule Take 1 g by mouth daily.    Yes Historical Provider, MD  GLUCOSAMINE PO Take 1 tablet by mouth daily.    Yes Historical Provider, MD  labetalol (NORMODYNE) 200 MG tablet Take 1/2 tablet twice a day 07/27/10  Yes Peter [place], MD  levothyroxine (SYNTHROID, LEVOTHROID) 88 MCG tablet Take 88 mcg by mouth daily.    Yes Historical Provider, MD  lovastatin (MEVACOR) 40 MG tablet Take 40 mg by mouth at bedtime.     Yes Historical Provider, MD  Multiple Vitamin (MULTIVITAMIN) tablet Take 1 tablet by mouth daily.     Yes Historical Provider, MD  olmesartan (BENICAR) 20 MG tablet Take 20 mg by mouth daily.     Yes Historical Provider, MD   No Known Allergies Family History  Problem Relation Age of Onset  . Heart failure Father   . Heart attack Brother   . Anemia Brother 73  mi   Social History  reports that he has quit smoking. He does not have any smokeless tobacco history on file. He reports that he does not drink alcohol. His drug history not on file.  Review Of Systems   Cannot obtain due to confusion  Vital Signs:  BP on arrival 213/128 Filed Vitals:   02/23/11 1938 02/23/11 2030 02/23/11 2152 02/23/11 2223  BP: 186/110 203/155 172/107 171/103  Pulse:      Temp:      TempSrc:      Resp: 16 13    SpO2: 100% 100% 100%     Physical Examination: Gen: resting comfortably in bed HEENT: NCAT, PERRL, EOMi, OP clear,  Neck: supple without masses PULM: CTA B CV: RRR, no mgr, no JVD AB: BS+, soft, nontender, no hsm Ext: warm, no edema, no clubbing, no cyanosis Derm: no rash or skin breakdown Neuro: somnolent but arousable, repeats words but doesn't communicate otherwise, follows simple commands (roll over).  Withdraws  to pain in all four extremeties, cough/gag intact Psyche: cannot assess  Labs and Imaging:   CBC    Component Value Date/Time   WBC 6.2 02/23/2011 1907   RBC 4.89 02/23/2011 1907   HGB 16.3 02/23/2011 1919   HCT 48.0 02/23/2011 1919   PLT 147* 02/23/2011 1907   MCV 94.3 02/23/2011 1907   MCH 33.5 02/23/2011 1907   MCHC 35.6 02/23/2011 1907   RDW 13.3 02/23/2011 1907   LYMPHSABS 1.1 02/23/2011 1907   MONOABS 0.5 02/23/2011 1907   EOSABS 0.4 02/23/2011 1907   BASOSABS 0.0 02/23/2011 1907    BMET    Component Value Date/Time   NA 140 02/23/2011 1919   K 3.9 02/23/2011 1919   CL 105 02/23/2011 1919   CO2 27 02/23/2011 1907   GLUCOSE 108* 02/23/2011 1919   BUN 28* 02/23/2011 1919   CREATININE 1.40* 02/23/2011 1919   CALCIUM 9.6 02/23/2011 1907   GFRNONAA 68* 02/23/2011 1907   GFRAA 79* 02/23/2011 1907    Assessment and Plan:  70 y/o male with multiple prior TIA's and a history of a subarachnoid hemorrhage in the past presented to the [facility] ED with acute onset mental status change and hypertension.  Head CT was without acute change.  DDX is hypertensive encephalopathy vs. tia vs. PRES syndrome vs. less likely metabolic encephalopathy or toxic ingestion or occult infection.  Suspect hypertensive encephalopathy.  No fever, chills, complaint of headache or neck stiffness prior to suggest CNS infection.   Hypertensive encephalopathy ()   Assessment: most likely explanation of BP. Not worsening acutely and BP adequately controlled now, but considering acute change in mental status will monitor in ICU overnight.   Plan:  BP management: -goal DBP 100, no lower than 90 -labetalol 20mg  IV q52min prn -if no improvement or if multiple IV pushes needed would change to nitroprusside -restart home meds in AM, labetalol tonight  Work up of confusion: -MRI in AM -ammonia, tsh, b12, folate, u/a -acetaminophen, asa, uds -abg x1 -f/u with neurology in AM  Hx of transient ischemic attack (TIA) ()    Assessment: possibly at play here, though no one focal lesion seems likely based on exam   Plan:  -asa -MRI in am -goal BP as above  AKI (acute kidney injury) (02/23/2011)   Assessment: likely due to malignancy hypertension   Plan:  -bp management as above -u/a, urine na, cr -repeat BMET in AM, suspect Cr will rise with BP control  Hyperlipidemia ()  Assessment:    Plan:  -lovastatin  Sleep apnea ()   Assessment:    Plan:  -monitor O2 sats for now -consider starting CPAP  Coronary artery disease ()   Assessment: history of, no evidence of acute coronary ischaemia on EKG   Plan:  -12 lead in AM  Best practices / Disposition:  Feeding/protein malnutrition: npo Analgesia: n/a Sedation: n/a Thromboprophylaxis: scd HOB >30 degrees Ulcer prophylaxis: n/a Glucose control/hyperglycemia: follow cbg  The patient is critically ill with multiple organ systems failure and requires high complexity decision making for assessment and support, frequent evaluation and titration of therapies, application of advanced monitoring technologies and extensive interpretation of multiple databases. Critical Care Time devoted to patient care services described in this note is 45 minutes.  Heber Cobre, M.D. Pulmonary and Critical Care Medicine [facility] Pager: [phone]  02/23/2011, 10:42 PM

## 2011-02-23 NOTE — Consult Note (Signed)
Reason for Consult: confusion, possible stroke  Referring Physician: Ghim  Chief Complaint: confusion, high BP  HPI: Benjamin Park is an 70 y.o. male with history of hypertension, throat cancer, coronary artery disease, hyperlipidemia, recurrent TIA, nonaneurysmal subarachnoid hemorrhage, was in normal state of health last seen by his wife around 5:30 PM. She left the house to run some errands. 30 minutes later he called on the phone saying "I'm sick".  She asked him to elaborate but he continued repeating the same phrase. She stayed on the phone with him, went back home and found him quite confused and unable to speak normally. She checked his blood pressure with her home machine and she recorded a reading of 217/125. The patient was brought to the hospital for further evaluation. Code stroke was activated. Patient's symptoms significantly improved, though he remained confused and hypertensive without focal deficit. Due to prior history of SAH and improving symptoms, code stroke was canceled.  Review of Systems  Unable to perform ROS: mental status change     Past Medical History  Diagnosis Date  . Hypertension   . Hx of transient ischemic attack (TIA)   . Hyperlipidemia   . Coronary artery disease   . Throat cancer     radiation therapy  . Hypertensive encephalopathy   . ED (erectile dysfunction)   . Personal history of kidney stones   . Subarachnoid hemorrhage   . Sleep apnea     on CPAP    Past Surgical History  Procedure Date  . Angioplasty 1993    LAD  . Cardiac catheterization   . Coronary angioplasty   . Basal cell cancer     head  . Hernia repair   . Appendectomy   . Patent foramen ovale closure 2008    Family History  Problem Relation Age of Onset  . Heart failure Father   . Heart attack Brother   . Anemia Brother 81    mi    Social History:  reports that he has quit smoking. He does not have any smokeless tobacco history on file. He reports that he does  not drink alcohol. His drug history not on file.  Allergies: No Known Allergies  Medications Prior to Admission  Medication Dose Route Frequency Provider Last Rate Last Dose  . labetalol (NORMODYNE,TRANDATE) injection 10 mg  10 mg Intravenous Once Theotis Burrow, MD   10 mg at 02/23/11 1934  . labetalol (NORMODYNE,TRANDATE) injection 20 mg  20 mg Intravenous Once Theotis Burrow, MD   20 mg at 02/23/11 2142  . labetalol (NORMODYNE,TRANDATE) injection 20 mg  20 mg Intravenous Q10 min PRN Max Fickle, MD      . LORazepam (ATIVAN) injection 0.5 mg  0.5 mg Intravenous Once Theotis Burrow, MD   0.5 mg at 02/23/11 2143  . ondansetron (ZOFRAN) 4 MG/2ML injection        4 mg at 02/23/11 2114  . ondansetron (ZOFRAN) injection 4 mg  4 mg Intravenous Once Theotis Burrow, MD      . DISCONTD: LORazepam (ATIVAN) tablet 0.5 mg  0.5 mg Oral Once Theotis Burrow, MD      . DISCONTD: ondansetron (ZOFRAN-ODT) disintegrating tablet 4 mg  4 mg Oral Once Theotis Burrow, MD       Medications Prior to Admission  Medication Sig Dispense Refill  . chlorthalidone (HYGROTON) 25 MG tablet Take 25 mg by mouth daily. Taking 1/2 daily       . fish oil-omega-3 fatty acids 1000 MG  capsule Take 1 g by mouth daily.       Marland Kitchen GLUCOSAMINE PO Take 1 tablet by mouth daily.       Marland Kitchen labetalol (NORMODYNE) 200 MG tablet Take 1/2 tablet twice a day  90 tablet  3  . levothyroxine (SYNTHROID, LEVOTHROID) 88 MCG tablet Take 88 mcg by mouth daily.       Marland Kitchen lovastatin (MEVACOR) 40 MG tablet Take 40 mg by mouth at bedtime.        . Multiple Vitamin (MULTIVITAMIN) tablet Take 1 tablet by mouth daily.         Inpatient Medications:  Scheduled:   . aspirin  324 mg Oral NOW   Or  . aspirin  300 mg Rectal NOW  . labetalol  10 mg Intravenous Once  . labetalol  20 mg Intravenous Once  . LORazepam  0.5 mg Intravenous Once  . ondansetron      . ondansetron (ZOFRAN) IV  4 mg Intravenous  Once  . DISCONTD: LORazepam  0.5 mg Oral Once  . DISCONTD: ondansetron  4 mg Oral Once    Results for orders placed during the hospital encounter of 02/23/11 (from the past 48 hour(s))  PROTIME-INR     Status: Normal   Collection Time   02/23/11  7:07 PM      Component Value Range Comment   Prothrombin Time 13.0  11.6 - 15.2 (seconds)    INR 0.96  0.00 - 1.49    APTT     Status: Normal   Collection Time   02/23/11  7:07 PM      Component Value Range Comment   aPTT 35  24 - 37 (seconds)   CBC     Status: Abnormal   Collection Time   02/23/11  7:07 PM      Component Value Range Comment   WBC 6.2  4.0 - 10.5 (K/uL)    RBC 4.89  4.22 - 5.81 (MIL/uL)    Hemoglobin 16.4  13.0 - 17.0 (g/dL)    HCT 09.8  11.9 - 14.7 (%)    MCV 94.3  78.0 - 100.0 (fL)    MCH 33.5  26.0 - 34.0 (pg)    MCHC 35.6  30.0 - 36.0 (g/dL)    RDW 82.9  56.2 - 13.0 (%)    Platelets 147 (*) 150 - 400 (K/uL)   DIFFERENTIAL     Status: Abnormal   Collection Time   02/23/11  7:07 PM      Component Value Range Comment   Neutrophils Relative 67  43 - 77 (%)    Neutro Abs 4.2  1.7 - 7.7 (K/uL)    Lymphocytes Relative 18  12 - 46 (%)    Lymphs Abs 1.1  0.7 - 4.0 (K/uL)    Monocytes Relative 8  3 - 12 (%)    Monocytes Absolute 0.5  0.1 - 1.0 (K/uL)    Eosinophils Relative 6 (*) 0 - 5 (%)    Eosinophils Absolute 0.4  0.0 - 0.7 (K/uL)    Basophils Relative 1  0 - 1 (%)    Basophils Absolute 0.0  0.0 - 0.1 (K/uL)   COMPREHENSIVE METABOLIC PANEL     Status: Abnormal   Collection Time   02/23/11  7:07 PM      Component Value Range Comment   Sodium 139  135 - 145 (mEq/L)    Potassium 3.8  3.5 - 5.1 (mEq/L) HEMOLYSIS AT THIS LEVEL MAY AFFECT RESULT  Chloride 101  96 - 112 (mEq/L)    CO2 27  19 - 32 (mEq/L)    Glucose, Bld 108 (*) 70 - 99 (mg/dL)    BUN 23  6 - 23 (mg/dL)    Creatinine, Ser 1.61  0.50 - 1.35 (mg/dL)    Calcium 9.6  8.4 - 10.5 (mg/dL)    Total Protein 7.8  6.0 - 8.3 (g/dL)    Albumin 4.0  3.5 - 5.2  (g/dL)    AST 37  0 - 37 (U/L) HEMOLYSIS AT THIS LEVEL MAY AFFECT RESULT   ALT 19  0 - 53 (U/L)    Alkaline Phosphatase 74  39 - 117 (U/L)    Total Bilirubin 0.3  0.3 - 1.2 (mg/dL)    GFR calc non Af Amer 68 (*) >90 (mL/min)    GFR calc Af Amer 79 (*) >90 (mL/min)   POCT I-STAT, CHEM 8     Status: Abnormal   Collection Time   02/23/11  7:19 PM      Component Value Range Comment   Sodium 140  135 - 145 (mEq/L)    Potassium 3.9  3.5 - 5.1 (mEq/L)    Chloride 105  96 - 112 (mEq/L)    BUN 28 (*) 6 - 23 (mg/dL)    Creatinine, Ser 0.96 (*) 0.50 - 1.35 (mg/dL)    Glucose, Bld 045 (*) 70 - 99 (mg/dL)    Calcium, Ion 4.09 (*) 1.12 - 1.32 (mmol/L)    TCO2 27  0 - 100 (mmol/L)    Hemoglobin 16.3  13.0 - 17.0 (g/dL)    HCT 81.1  91.4 - 78.2 (%)   CK TOTAL AND CKMB     Status: Abnormal   Collection Time   02/23/11  7:44 PM      Component Value Range Comment   Total CK 145  7 - 232 (U/L)    CK, MB 4.3 (*) 0.3 - 4.0 (ng/mL)    Relative Index 3.0 (*) 0.0 - 2.5    TROPONIN I     Status: Normal   Collection Time   02/23/11  7:44 PM      Component Value Range Comment   Troponin I <0.30  <0.30 (ng/mL)   URINALYSIS, ROUTINE W REFLEX MICROSCOPIC     Status: Abnormal   Collection Time   02/23/11  8:56 PM      Component Value Range Comment   Color, Urine YELLOW  YELLOW     APPearance CLOUDY (*) CLEAR     Specific Gravity, Urine 1.012  1.005 - 1.030     pH 8.0  5.0 - 8.0     Glucose, UA NEGATIVE  NEGATIVE (mg/dL)    Hgb urine dipstick NEGATIVE  NEGATIVE     Bilirubin Urine NEGATIVE  NEGATIVE     Ketones, ur NEGATIVE  NEGATIVE (mg/dL)    Protein, ur 30 (*) NEGATIVE (mg/dL)    Urobilinogen, UA 0.2  0.0 - 1.0 (mg/dL)    Nitrite NEGATIVE  NEGATIVE     Leukocytes, UA NEGATIVE  NEGATIVE    URINE MICROSCOPIC-ADD ON     Status: Abnormal   Collection Time   02/23/11  8:56 PM      Component Value Range Comment   WBC, UA 0-2  <3 (WBC/hpf)    RBC / HPF 0-2  <3 (RBC/hpf)    Bacteria, UA RARE  RARE      Casts GRANULAR CAST (*) NEGATIVE     Urine-Other  AMORPHOUS URATES/PHOSPHATES       02/23/2011  CT HEAD WITHOUT CONTRAST  IMPRESSION:  1.  No CT evidence of acute stroke or hemorrhage. 2.  Stable chronic atrophy and small vessel ischemic changes. 3.  Polypoid left maxillary sinus lesion suspicious for a polyp.  Critical Value/emergent results were called by telephone at the time of interpretation on 02/23/2011  at 1916 hours  to  Dr. Oletta Lamas, who verbally acknowledged these results.  Original Report Authenticated By: Gerrianne Scale, M.D.  I reviewed images myself.  Moderate to severe diffuse atrophy and moderate chronic small vessel ischemic disease. No acute findings. -VRP   Physical exam: Blood pressure 171/103, pulse 82, temperature 97.9 F (36.6 C), temperature source Oral, resp. rate 13, SpO2 100.00%. Temp:  [97.9 F (36.6 C)] 97.9 F (36.6 C) (12/06 1857) Pulse Rate:  [82] 82  (12/06 1857) Resp:  [13-16] 13  (12/06 2030) BP: (171-213)/(103-155) 171/103 mmHg (12/06 2223) SpO2:  [100 %] 100 % (12/06 2152)  GENERAL EXAM: PATIENT IS INTERMITTENTLY SLEEPING AND THEN RESTLESS.  CARDIOVASCULAR: Regular rate and rhythm, no murmurs, no carotid bruits  NEUROLOGIC: MENTAL STATUS: LETHARGIC. CONFUSED; EYES CLOSED; OPENS EYES TO STIM BUT NOT VOICE. NO VERBAL.  NOT FOLLOWING COMMANDS.  MOANS INTERMITTENTLY. CRANIAL NERVE: pupils equal and reactive to light, BLINKS TO THREAT; tracks my face and voice, but is uncooperative in exam.  Face appears symm. MOTOR: normal bulk and tone, MOVES ALL EXT SYMM AND SPONTANEOUSLY.  DOES NOT FOLLOW COMMANDS.  SENSORY: Withdraws symmetrically to stimulation. COORDINATION: Does not follow commands. REFLEXES: deep tendon reflexes present and symmetric GAIT/STATION: Patient confused unable to get out of bed.  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  Agree with TIA/stroke workup.  Recommendations: 1. MRI brain (wo), MRA head (wo) 2. Carotid u/s, TTE, lipid panel, A1c 3. Gradually reduce BP over next 24-48 hours 4. Aspirin 81mg  daily 5. Stroke team to follow  PENUMALLI,VIKRAM 02/23/2011, 10:48 PM

## 2011-02-23 NOTE — ED Provider Notes (Signed)
History     CSN: [id number] Arrival date & time: 02/23/2011  6:54 PM   First MD Initiated Contact with Patient 02/23/11 1904      Chief Complaint  Patient presents with  . Emesis  . Hypertension  . Headache    (Consider location/radiation/quality/duration/timing/severity/associated sxs/prior treatment) Patient is a 70 y.o. male presenting with altered mental status. The history is provided by the spouse. The history is limited by the condition of the patient.  Altered Mental Status This is a new problem. The current episode started today. The problem occurs constantly. The problem has been unchanged. Associated symptoms include nausea and vomiting. The symptoms are aggravated by nothing. He has tried nothing for the symptoms. The treatment provided no relief.    Past Medical History  Diagnosis Date  . Hypertension   . Hx of transient ischemic attack (TIA)   . Hyperlipidemia   . Coronary artery disease   . Throat cancer     radiation therapy  . Hypertensive encephalopathy   . ED (erectile dysfunction)   . Personal history of kidney stones   . Subarachnoid hemorrhage   . Sleep apnea     on CPAP    Past Surgical History  Procedure Date  . Angioplasty 1993    LAD  . Cardiac catheterization   . Coronary angioplasty   . Basal cell cancer     head  . Hernia repair   . Appendectomy   . Patent foramen ovale closure 2008    Family History  Problem Relation Age of Onset  . Heart failure Father   . Heart attack Brother   . Anemia Brother 99    mi    History  Substance Use Topics  . Smoking status: Former Games developer  . Smokeless tobacco: Not on file  . Alcohol Use:       Review of Systems  Unable to perform ROS: Mental status change  Gastrointestinal: Positive for nausea and vomiting.  Psychiatric/Behavioral: Positive for altered mental status.    Allergies  Review of patient's allergies indicates no known allergies.  Home Medications   Current Outpatient  Rx  Name Route Sig Dispense Refill  . ASPIRIN 81 MG PO TABS Oral Take 81 mg by mouth daily.      . CHLORTHALIDONE 25 MG PO TABS Oral Take 25 mg by mouth daily. Taking 1/2 daily     . OMEGA-3 FATTY ACIDS 1000 MG PO CAPS Oral Take by mouth daily.      Marland Kitchen GLUCOSAMINE PO Oral Take by mouth daily.      Marland Kitchen LABETALOL HCL 200 MG PO TABS  Take 1/2 tablet twice a day 90 tablet 3  . LEVOTHYROXINE SODIUM 88 MCG PO TABS Oral Take 88 mcg by mouth daily.      Marland Kitchen LOVASTATIN 40 MG PO TABS Oral Take 40 mg by mouth at bedtime.      Marland Kitchen ONE-DAILY MULTI VITAMINS PO TABS Oral Take 1 tablet by mouth daily.      Marland Kitchen BENICAR PO Oral Take by mouth. Taking 10mg  daily       BP 213/128  Pulse 82  Temp(Src) 97.9 F (36.6 C) (Oral)  SpO2 100%  Physical Exam  Nursing note and vitals reviewed. Constitutional: He appears well-developed and well-nourished.  HENT:  Head: Normocephalic and atraumatic.  Eyes: EOM are normal. Pupils are equal, round, and reactive to light.  Cardiovascular: Normal rate and regular rhythm.   Pulmonary/Chest: Effort normal and breath sounds normal. No respiratory  distress.  Abdominal: Soft. There is no tenderness.  Neurological: He is alert. He has normal strength. He is disoriented. A cranial nerve deficit is present. GCS eye subscore is 3. GCS verbal subscore is 3. GCS motor subscore is 6.  Reflex Scores:      Bicep reflexes are 2+ on the right side and 2+ on the left side.      Patellar reflexes are 2+ on the right side and 2+ on the left side.      Intermittently following commands; as best as able to tell has no focal weakness. Says "no" to every question. No clonus  Skin: Skin is warm and dry.  Psychiatric: He has a normal mood and affect.    ED Course  Procedures (including critical care time)  Labs Reviewed  CBC - Abnormal; Notable for the following:    Platelets 147 (*)    All other components within normal limits  DIFFERENTIAL - Abnormal; Notable for the following:     Eosinophils Relative 6 (*)    All other components within normal limits  COMPREHENSIVE METABOLIC PANEL - Abnormal; Notable for the following:    Glucose, Bld 108 (*)    GFR calc non Af Amer 68 (*)    GFR calc Af Amer 79 (*)    All other components within normal limits  POCT I-STAT, CHEM 8 - Abnormal; Notable for the following:    BUN 28 (*)    Creatinine, Ser 1.40 (*)    Glucose, Bld 108 (*)    Calcium, Ion 1.07 (*)    All other components within normal limits  PROTIME-INR  APTT  CK TOTAL AND CKMB  TROPONIN I   Ct Head Wo Contrast  02/23/2011  *RADIOLOGY REPORT*  Clinical Data: Headache with altered mental status and vomiting. Evaluate for stroke or hemorrhage.  CT HEAD WITHOUT CONTRAST  Technique:  Contiguous axial images were obtained from the base of the skull through the vertex without contrast.  Comparison: MR of the brain 10/04/2010.  Head CT 10/03/2010.  Findings: There is stable generalized prominence of the ventricles and subarachnoid spaces consistent with atrophy.  Chronic small vessel ischemic changes in the periventricular white matter are unchanged.  There is no evidence of acute intracranial hemorrhage, mass lesion, brain edema or extra-axial fluid collection.  There is no CT evidence of acute stroke.  Within the left maxillary sinus, there is a 1.4 cm polypoid lesion on image 1.  This was present on the prior MRI, although was partly obscured by the adjacent mucosal thickening.  The visualized paranasal sinuses are otherwise clear.  Calvarium is intact.  IMPRESSION:  1.  No CT evidence of acute stroke or hemorrhage. 2.  Stable chronic atrophy and small vessel ischemic changes. 3.  Polypoid left maxillary sinus lesion suspicious for a polyp.  Critical Value/emergent results were called by telephone at the time of interpretation on 02/23/2011  at 1916 hours  to  Dr. Oletta Lamas, who verbally acknowledged these results.  Original Report Authenticated By: Gerrianne Scale, M.D.      Diagnosis: Hypertensive encephalopathy    MDM  This is a 70 year old male, who presents today for not acting quite normal per his wife. States the last time that she saw him normal was at 1730. At 1800 he called her saying only that he was nauseous, and cannot provide any more information. She returned home to be able to take him to the ED. Upon his arrival to the knee, he was  noted to be significantly hypertensive, was significantly confused, with difficulty following commands, but as best as exam was able to tell, had no focal deficits. The patient is currently nonverbal, shaking his head no to all questions. According to his wife, he had a past hemorrhagic stroke in February of 2011, and he presented similarly with aphasia; however he has also had multiple episodes of TIAs in the past. Due to sudden onset of alteration of mental status, and patient's current condition, we'll call a code stroke.   There is no sign of recurrent hemorrhagic stroke. The patient's blood pressure has decreased to a systolic in the 180s, and he is more alert and responsive at this time. The decision has been made with the neurology team to cancel a code stroke; the patient is not a TPA candidate anyway is due to his prior history of hemorrhagic stroke. Will make sure that patient's blood pressure stays under control, wait for his labwork to return and will likely admit for a presumed hypertension emergency.  Discussed this with medicine, who recommended admission to the ICU for close monitoring. Discussed with critical care attending, who will admit the patient to the ICU. The patient's condition remains unchanged, though he has had a slight decrease in his blood pressure. Discussed the treatment plan with his wife, who expresses understanding.        Theotis Burrow, MD 02/24/11 [phone]

## 2011-02-23 NOTE — ED Notes (Signed)
Phoned bed control to change inpt bed from [facility] -3300 to ICU- 3100 per Dr. Kendrick Fries. Courtesy call to [place], RN on 3300 to inform of this change. Family remains @ bedside. Waiting on new bed assignment

## 2011-02-23 NOTE — ED Notes (Signed)
Admitting MD at bedside.

## 2011-02-23 NOTE — ED Notes (Signed)
Patient transported to Ultrasound 

## 2011-02-23 NOTE — ED Notes (Signed)
Patient transported to CT 

## 2011-02-23 NOTE — ED Notes (Signed)
Family at bedside. Pt currently sleeping comfortably. Wife reports that he wakes up periodically confused. Patient will talk to wife but "mixes up" his words. Pt told wife that "i know what i want to say but i just can't get it out" RN asked wife if she would notify staff the next time he awakes so that we may assess his current status. Wife verbalized understanding and remains at bedside.

## 2011-02-23 NOTE — ED Notes (Signed)
Neurology MD at bedside

## 2011-02-23 NOTE — ED Notes (Signed)
Pt placed on zoll for transport, report given to Wylene Men, RN on 3300 -- just prior to completing report, admitting MD informed EDRN to hold pt transport for now. Pt will be seen by critical care.

## 2011-02-23 NOTE — ED Notes (Signed)
MD at bedside. Pt alert, remains confused. Does not respond appropriately to questions. Will randomly moan and say "oh god."

## 2011-02-23 NOTE — Progress Notes (Signed)
eLink Physician-Brief Progress Note Patient Name: Benjamin Park DOB: 11/19/1940 MRN: [id number]  Date of Service  02/23/2011   HPI/Events of Note   Call from nurse that patient vomited and may have aspirated with drop in sats to 88%.  Placed on [place] O2 at 4 L with sats of 99% now in no apparent resp distress.  Patient is confused and not cooperative  eICU Interventions  1. Continue O2 support 2. Order for zofran 4 mg IV q8 hours prn nausea 3. Order PCXR in AM to evaluate for any infiltrate suggestive of aspiration 4. Order for foley catheter   Intervention Category Minor Interventions: Routine modifications to care plan (e.g. PRN medications for pain, fever)  DETERDING,ELIZABETH 02/23/2011, 11:57 PM

## 2011-02-23 NOTE — ED Notes (Signed)
Pt brought by wife. Pt brought back to triage and had episode of vomiting. Pt unable to answer any questions just kept shaking head. BP 215/124. Pt brought to room.

## 2011-02-23 NOTE — ED Notes (Signed)
Pts neuro assessment entered at 1917 was initially performed at 1855 when patient arrived in room. Unable to change documentation time.

## 2011-02-23 NOTE — ED Notes (Signed)
Critical care MD at bedside 

## 2011-02-23 NOTE — ED Provider Notes (Signed)
  I performed a history and physical examination of Benjamin Park and discussed his management with Dr. Leary Roca.  I agree with the history, physical, assessment, and plan of care, with the following exceptions: see below  I was present for the following procedures: None Time Spent in Critical Care of the patient: 30 min Time spent in discussions with the patient and family: 20 min   Per spouse patient was last seen normal at 5: 30 p.m. Patient has a history of hemorrhagic stroke as well as 6 TIAs in the past according to the spouse. His hemorrhagic stroke was in February of 2011. Patient presents here quite hypertensive with altered mental status. He is protecting his airway currently. He'll open his eyes to voice and intermittently will follow simple commands. His only verbal response to any question however is the answer now. There does not seem to be asymmetric focal neurologic deficits at this time. However given his history and current presentation along with severe hypertension, code stroke was activated.  Head CT pending, labs and BP control will be done.  Consultations with neurology, NSU will be obtained as indicated on head CT.  Will require admission to the hospital.  Given HTN and h/o prior hemorrhage, likely TPA is contraindicated.    Benjamin Park  7:50 PM Spoke to the neurologist. We agree that patient's symptoms are either do to a TIA or hypertensive emergency. Plan is to give doses of labetalol to control blood pressure and will plan on admitting to medical team for continued monitoring, treatment of hypertension and hopefully monitoring for resolution of his symptoms. Neurology will consult and follow along with the patient.  Benjamin Park. Oletta Lamas, MD 02/23/11 1610

## 2011-02-24 ENCOUNTER — Inpatient Hospital Stay (HOSPITAL_COMMUNITY): Payer: Medicare Other

## 2011-02-24 ENCOUNTER — Other Ambulatory Visit: Payer: Self-pay

## 2011-02-24 DIAGNOSIS — G459 Transient cerebral ischemic attack, unspecified: Secondary | ICD-10-CM

## 2011-02-24 DIAGNOSIS — I674 Hypertensive encephalopathy: Secondary | ICD-10-CM

## 2011-02-24 DIAGNOSIS — N179 Acute kidney failure, unspecified: Secondary | ICD-10-CM

## 2011-02-24 DIAGNOSIS — F05 Delirium due to known physiological condition: Secondary | ICD-10-CM

## 2011-02-24 LAB — URINALYSIS, ROUTINE W REFLEX MICROSCOPIC
Glucose, UA: 100 mg/dL — AB
Ketones, ur: NEGATIVE mg/dL
Leukocytes, UA: NEGATIVE
Protein, ur: 100 mg/dL — AB
pH: 8 (ref 5.0–8.0)

## 2011-02-24 LAB — BLOOD GAS, ARTERIAL
Acid-Base Excess: 1.9 mmol/L (ref 0.0–2.0)
Drawn by: 34779
TCO2: 27 mmol/L (ref 0–100)
pCO2 arterial: 39.1 mmHg (ref 35.0–45.0)
pH, Arterial: 7.434 (ref 7.350–7.450)
pO2, Arterial: 81.4 mmHg (ref 80.0–100.0)

## 2011-02-24 LAB — CARDIAC PANEL(CRET KIN+CKTOT+MB+TROPI): Total CK: 104 U/L (ref 7–232)

## 2011-02-24 LAB — DRUGS OF ABUSE SCREEN W/O ALC, ROUTINE URINE
Benzodiazepines.: NEGATIVE
Creatinine,U: 223.6 mg/dL
Opiate Screen, Urine: NEGATIVE
Phencyclidine (PCP): NEGATIVE
Propoxyphene: NEGATIVE

## 2011-02-24 LAB — URINE MICROSCOPIC-ADD ON

## 2011-02-24 LAB — CBC
MCH: 33.3 pg (ref 26.0–34.0)
MCHC: 35.7 g/dL (ref 30.0–36.0)
MCV: 93.2 fL (ref 78.0–100.0)
Platelets: 163 10*3/uL (ref 150–400)
RDW: 13.2 % (ref 11.5–15.5)

## 2011-02-24 LAB — LIPID PANEL
Cholesterol: 145 mg/dL (ref 0–200)
HDL: 41 mg/dL (ref >39)
Total CHOL/HDL Ratio: 3.5 RATIO
VLDL: 14 mg/dL (ref 0–40)

## 2011-02-24 LAB — ACETAMINOPHEN LEVEL: Acetaminophen (Tylenol), Serum: <15 ug/mL (ref 10–30)

## 2011-02-24 LAB — BASIC METABOLIC PANEL
Calcium: 8.7 mg/dL (ref 8.4–10.5)
Creatinine, Ser: 1.05 mg/dL (ref 0.50–1.35)
GFR calc Af Amer: 82 mL/min — ABNORMAL LOW (ref >90)

## 2011-02-24 LAB — SODIUM, URINE, RANDOM: Sodium, Ur: 168 mEq/L

## 2011-02-24 LAB — MRSA PCR SCREENING: MRSA by PCR: NEGATIVE

## 2011-02-24 LAB — FOLATE RBC: RBC Folate: 1473 ng/mL — ABNORMAL HIGH (ref >=366)

## 2011-02-24 LAB — SALICYLATE LEVEL: Salicylate Lvl: <2 mg/dL — ABNORMAL LOW (ref 2.8–20.0)

## 2011-02-24 MED ORDER — POTASSIUM CHLORIDE 10 MEQ/100ML IV SOLN
10.0000 meq | INTRAVENOUS | Status: AC
  Administered 2011-02-24 (×5): 10 meq via INTRAVENOUS
  Filled 2011-02-24 (×5): qty 100

## 2011-02-24 MED ORDER — ACETAMINOPHEN 650 MG RE SUPP
650.0000 mg | Freq: Four times a day (QID) | RECTAL | Status: DC | PRN
  Administered 2011-02-24: 650 mg via RECTAL
  Filled 2011-02-24: qty 1

## 2011-02-24 MED ORDER — GADOBENATE DIMEGLUMINE 529 MG/ML IV SOLN
15.0000 mL | Freq: Once | INTRAVENOUS | Status: AC | PRN
  Administered 2011-02-24: 15 mL via INTRAVENOUS

## 2011-02-24 MED ORDER — LORAZEPAM 2 MG/ML IJ SOLN
1.0000 mg | Freq: Once | INTRAMUSCULAR | Status: DC
  Filled 2011-02-24: qty 1

## 2011-02-24 MED ORDER — HALOPERIDOL LACTATE 5 MG/ML IJ SOLN
5.0000 mg | Freq: Once | INTRAMUSCULAR | Status: DC
  Filled 2011-02-24: qty 1

## 2011-02-24 NOTE — Progress Notes (Signed)
*  PRELIMINARY RESULTS* EEG has been performed.  Markus Jarvis 02/24/2011, 2:47 PM

## 2011-02-24 NOTE — Progress Notes (Signed)
eLink Physician-Brief Progress Note Patient Name: Benjamin Park DOB: February 17, 1941 MRN: [id number]  Date of Service  02/24/2011   HPI/Events of Note  Temp of 101.31F   eICU Interventions  Order for tylenol 650 mg q6 hours prn temp greater than 100.55F   Intervention Category Minor Interventions: Routine modifications to care plan (e.g. PRN medications for pain, fever)  Benjamin Park 02/24/2011, 3:53 AM

## 2011-02-24 NOTE — Progress Notes (Signed)
Stroke Team Progress Note  SUBJECTIVE Benjamin Park is an 70 y.o. male with history of hypertension, throat cancer, coronary artery disease, hyperlipidemia, recurrent TIA, nonaneurysmal subarachnoid hemorrhage, was in normal state of health last seen by his wife around 5:30 PM. She left the house to run some errands. 30 minutes later he called on the phone saying "I'm sick". She asked him to elaborate but he continued repeating the same phrase. She stayed on the phone with him, went back home and found him quite confused and unable to speak normally. She checked his blood pressure with her home machine and she recorded a reading of 217/125. The patient was brought to the hospital for further evaluation. Code stroke was activated. Patient's symptoms significantly improved, though he remained confused and hypertensive without focal deficit. Due to prior history of SAH and improving symptoms, code stroke was canceled. His sitter is at the bedside. He remains encephalopathic per RN, though improved.Blood pressure is adequately controlled.  OBJECTIVE Most recent Vital Signs: Temp: 100.5 F (38.1 C) (12/07 0530) Temp src: Oral (12/07 0530) BP: 112/75 mmHg (12/07 0700) Pulse Rate: 84  (12/07 0700) Respiratory Rate: 21 O2 Saturdation: 96%  Intake/Output from previous day: 12/06 0701 - 12/07 0700 In: 100 [I.V.:100] Out: 955 [Urine:955]  IV Fluid Intake:     . sodium chloride 20 mL/hr at 02/24/11 0700   Diet:  NPO   Activity:  Strict bedrest  DVT Prophylaxis:  SCDs   Studies: CBC    Component Value Date/Time   WBC 17.8* 02/24/2011 0420   RBC 4.69 02/24/2011 0420   HGB 15.6 02/24/2011 0420   HCT 43.7 02/24/2011 0420   PLT 163 02/24/2011 0420   MCV 93.2 02/24/2011 0420   MCH 33.3 02/24/2011 0420   MCHC 35.7 02/24/2011 0420   RDW 13.2 02/24/2011 0420   LYMPHSABS 1.1 02/23/2011 1907   MONOABS 0.5 02/23/2011 1907   EOSABS 0.4 02/23/2011 1907   BASOSABS 0.0 02/23/2011 1907   CMP    Component  Value Date/Time   NA 139 02/24/2011 0420   K 3.1* 02/24/2011 0420   CL 101 02/24/2011 0420   CO2 26 02/24/2011 0420   GLUCOSE 159* 02/24/2011 0420   BUN 22 02/24/2011 0420   CREATININE 1.05 02/24/2011 0420   CALCIUM 8.7 02/24/2011 0420   PROT 7.8 02/23/2011 1907   ALBUMIN 4.0 02/23/2011 1907   AST 37 02/23/2011 1907   ALT 19 02/23/2011 1907   ALKPHOS 74 02/23/2011 1907   BILITOT 0.3 02/23/2011 1907   GFRNONAA 70* 02/24/2011 0420   GFRAA 82* 02/24/2011 0420   COAGS Lab Results  Component Value Date   INR 0.96 02/23/2011   INR 1.00 10/03/2010   INR 0.99 10/03/2010   Lipid Panel    Component Value Date/Time   CHOL 138 10/04/2010 0549   TRIG 161* 10/04/2010 0549   HDL 33* 10/04/2010 0549   CHOLHDL 4.2 10/04/2010 0549   VLDL 32 10/04/2010 0549   LDLCALC 73 10/04/2010 0549   HgbA1C  Lab Results  Component Value Date   HGBA1C 5.4 10/03/2010   Urine Drug Screen  No results found for this basename: labopia, cocainscrnur, labbenz, amphetmu, thcu, labbarb    Alcohol Level No results found for this basename: eth   CT of the brain    1.  No CT evidence of acute stroke or hemorrhage. 2.  Stable chronic atrophy and small vessel ischemic changes. 3.  Polypoid left maxillary sinus lesion suspicious for a polyp.  MRI of the brain  ordered   MRA of the brain  Not ordered  2D Echocardiogram  Not ordered   Carotid Doppler  Not ordered    CXR  Limited exam.  The atrial closure device is grossly unchanged in position.  Repeat chest x-ray with better positioning is recommended.   EKG ordered   Physical Exam   Elderly Caucasian gentleman currently not in distress. Afebrile. Head is nontraumatic. Neck is supple without bruit. Hearing appears normal.Cardiac exam no gallop. Lungs clear to auscultation.  Neurological exam  Drowsy but arouses easily and follows simple one step commands. Answers his name and replies yes no to questions. Disoriented to time place and person. Speech is hesitant nonfluent. No  dysarthria. Eye moments are full range without nystagmus. Blink to threat bilaterally. Face is symmetric without weakness. Tongue is midline. Moves all 4 extremities equally well against gravity without focal weakness. Able to hold tone in all 4 extremities. Withdraws to pain in all 4 extremities equally well. Plantars are both downgoing. Gait was not tested. He has mild asterixis in both hands left more than right. ASSESSMENT Mr. Benjamin Park is a 70 y.o. male with mental status change, non-focal neurologic exam, encephalopathic, unlikely stroke. MRI pending. Hypertensive emergency/urgency more likely verusus postictal state from unwitnessed seizure..   Stroke risk factors:  hypertension and TIA, hyperlipidemia, CAD, OSA, [facility]  Hospital day # 1  TREATMENT/PLAN Continue aspirin for secondary stroke prevention. Check MRI, EEG, lipids. Haldol for MRI sedation. Ongoing BP control.D/W Dr Delton Coombes  Annie Main, AVNP, ANP-BC, GNP-BC [facility] Pager: [phone] 02/24/2011 8:25 AM  Dr. Delia Heady, Stroke Center Medical Director, has personally reviewed chart, pertinent data, examined the patient and developed the plan of care.

## 2011-02-24 NOTE — Progress Notes (Signed)
Speech Language/Pathology  Attempted swallow assessment.  Pt.did not follow SLP's commands, or answer questions.  Verbalized several times ("yeah", "ok").  Performed brief oral care as pt. somewhat resistant, turning head away from [employer] toothbrush.  Pt. has history of dysphagia, throat cancer with radiation.  Most recent MBS 7/12 recommended Dys 3, nectar thick liquids PLAN:  Will return today to determine if pt. Appropriate to initiate swallow assessment.  Breck Coons [place].Ed [employer] [phone]  02/24/2011

## 2011-02-24 NOTE — H&P (Signed)
Name: Benjamin Park MRN: [id number] DOB: January 25, 1941  LOS: 1  CRITICAL CARE PROGRESS NOTE  History of Present Illness: 70 y/o male with known hypertension, prior TIA's and a prior subarachnoid hemorrhage presented to the [facility] ED on 12/6 with confusion. He had been feeling well recently and was last seen in his usual state of health by his wife around 1700.  She left the house and received a call from him at 1755 saying, "Come home, I'm sick."  He could not elaborate more and just kept saying, "I'm sick".  She found him to be confused but able to walk.  His wife brought him to the ED where he vomited in triage and could not speak.  Code stroke was called.  Head CT was without acute change.  Neurology felt that his symptoms were either due to a TIA or hypertensive encephalopathy.  He was to be admitted to the SDU on [facility], but [facility] requested our evaluation for ICU placement.  In the ED he was confused but able to follow simple commands.  He received two doses of labetalol and one dose of ativan 0.5mg .   Lines / Drains:  Cultures / Sepsis markers:  Antibiotics:  Tests / Events: 12/6 CT Head: NAICP, stable chronic atrophy and small vessel ischaemic changes     Vital Signs:  (BP on arrival 213/128) Filed Vitals:   02/24/11 0600 02/24/11 0700 02/24/11 0800 02/24/11 0900  BP: 114/82 112/75 113/70 129/66  Pulse: 89 84 87 84  Temp:      TempSrc:      Resp: 21 21 21 19   Height:      Weight:      SpO2: 95% 96% 97% 96%    Physical Examination: Gen: resting comfortably in bed HEENT: NCAT, PERRL, EOMi, OP clear,  Neck: supple without masses PULM: CTA B CV: RRR, no mgr, no JVD AB: BS+, soft, nontender, no hsm Ext: warm, no edema, no clubbing, no cyanosis Derm: no rash or skin breakdown Neuro: somnolent but arousable, repeats words but doesn't communicate otherwise, follows simple commands (roll over).  Withdraws to pain in all four extremeties, cough/gag intact Psyche: cannot  assess  Labs and Imaging:   CBC    Component Value Date/Time   WBC 17.8* 02/24/2011 0420   RBC 4.69 02/24/2011 0420   HGB 15.6 02/24/2011 0420   HCT 43.7 02/24/2011 0420   PLT 163 02/24/2011 0420   MCV 93.2 02/24/2011 0420   MCH 33.3 02/24/2011 0420   MCHC 35.7 02/24/2011 0420   RDW 13.2 02/24/2011 0420   LYMPHSABS 1.1 02/23/2011 1907   MONOABS 0.5 02/23/2011 1907   EOSABS 0.4 02/23/2011 1907   BASOSABS 0.0 02/23/2011 1907    BMET    Component Value Date/Time   NA 139 02/24/2011 0420   K 3.1* 02/24/2011 0420   CL 101 02/24/2011 0420   CO2 26 02/24/2011 0420   GLUCOSE 159* 02/24/2011 0420   BUN 22 02/24/2011 0420   CREATININE 1.05 02/24/2011 0420   CALCIUM 8.7 02/24/2011 0420   GFRNONAA 70* 02/24/2011 0420   GFRAA 82* 02/24/2011 0420   Ct Head Wo Contrast  02/23/2011  *RADIOLOGY REPORT*  Clinical Data: Headache with altered mental status and vomiting. Evaluate for stroke or hemorrhage.  CT HEAD WITHOUT CONTRAST  Technique:  Contiguous axial images were obtained from the base of the skull through the vertex without contrast.  Comparison: MR of the brain 10/04/2010.  Head CT 10/03/2010.  Findings: There is stable generalized  prominence of the ventricles and subarachnoid spaces consistent with atrophy.  Chronic small vessel ischemic changes in the periventricular white matter are unchanged.  There is no evidence of acute intracranial hemorrhage, mass lesion, brain edema or extra-axial fluid collection.  There is no CT evidence of acute stroke.  Within the left maxillary sinus, there is a 1.4 cm polypoid lesion on image 1.  This was present on the prior MRI, although was partly obscured by the adjacent mucosal thickening.  The visualized paranasal sinuses are otherwise clear.  Calvarium is intact.  IMPRESSION:  1.  No CT evidence of acute stroke or hemorrhage. 2.  Stable chronic atrophy and small vessel ischemic changes. 3.  Polypoid left maxillary sinus lesion suspicious for a polyp.  Critical  Value/emergent results were called by telephone at the time of interpretation on 02/23/2011  at 1916 hours  to  Dr. Oletta Lamas, who verbally acknowledged these results.  Original Report Authenticated By: Gerrianne Scale, M.D.   Dg Chest Port 1 View  02/24/2011  *RADIOLOGY REPORT*  Clinical Data: Device position  PORTABLE CHEST - 1 VIEW  Comparison: 04/11/2010  Findings: Thorax is rotated to the left limiting evaluation.  The atrial closure device is grossly unchanged in position.  The right lung is clear.  The left lung is obscured by the cardiac silhouette.  No obvious pneumothorax.  IMPRESSION: Limited exam.  The atrial closure device is grossly unchanged in position.  Repeat chest x-ray with better positioning is recommended.  Original Report Authenticated By: Donavan Burnet, M.D.     Assessment and Plan: 70 y/o male with multiple prior TIA's and a history of a subarachnoid hemorrhage in the past presented to the [facility] ED with acute onset mental status change and hypertension.  Head CT was without acute change.  DDX is hypertensive encephalopathy vs. tia vs. PRES syndrome vs. less likely metabolic encephalopathy or toxic ingestion or occult infection.  Suspect hypertensive encephalopathy.  No fever, chills, complaint of headache or neck stiffness prior to suggest CNS infection. ASA negative, NH3 22, tylenol negative, TSH 1.7, UDS pending  1. Hypertensive encephalopathy. Not worsening acutely and BP adequately controlled now - BP management: goal DBP 100, no lower than 90 -labetalol 20mg  IV q54min prn -restart home labetalol, norvasc, benicar 12/7 if he is able to take PO -MRI 12/7 -UDS results pending -Neurology following  2. Hx of transient ischemic attack (TIA)  -asa -MRI 12/7 if he can tolerate without agitation, consider haldol if needed  3. AKI (acute kidney injury) (02/23/2011), likely due to malignant hypertension, S Cr  1.05 -bp management as above -u/a, urine na, cr -follow S  Cr -replace K+ 12/7  4. Hyperlipidemia  -lovastatin when able to take PO  5. Sleep apnea  -monitor O2 sats for now -consider starting CPAP  6. Coronary artery disease; history of, no evidence of acute coronary ischaemia on EKG -12 lead 12/7  7. ? Episode aspiration after emesis - follow CXR and WBC for evolving PNA or pneumonitis  Best practices / Disposition:  Feeding/protein malnutrition: npo Analgesia: n/a Sedation: n/a Thromboprophylaxis: scd HOB >30 degrees Ulcer prophylaxis: n/a Glucose control/hyperglycemia: follow cbg  The patient is critically ill with multiple organ systems failure and requires high complexity decision making for assessment and support, frequent evaluation and titration of therapies, application of advanced monitoring technologies and extensive interpretation of multiple databases. Critical Care Time devoted to patient care services described in this note is 30 minutes.  Levy Pupa, MD, PhD  02/24/2011, 10:04 AM [facility] [phone] or if no answer [phone]

## 2011-02-24 NOTE — Progress Notes (Signed)
Speech Language/Pathology Clinical/Bedside Swallow Evaluation Patient Details  Name: Benjamin Park MRN: [id number] DOB: Aug 03, 1940 Today's Date: 02/24/2011  Past Medical History:  Past Medical History  Diagnosis Date  . Hypertension   . Hx of transient ischemic attack (TIA)   . Hyperlipidemia   . Coronary artery disease   . Throat cancer     radiation therapy  . Hypertensive encephalopathy   . ED (erectile dysfunction)   . Personal history of kidney stones   . Subarachnoid hemorrhage   . Sleep apnea     on CPAP  . Stroke   . Myocardial infarction     12/1991  . Headache    Past Surgical History:  Past Surgical History  Procedure Date  . Angioplasty 1993    LAD  . Cardiac catheterization   . Coronary angioplasty   . Basal cell cancer     head  . Hernia repair   . Appendectomy   . Patent foramen ovale closure 2008  . Eye surgery     Assessment/Recommendations/Treatment Plan    SLP Assessment Clinical Impression Statement: Patient has a h/o dysphagia with silent aspiration documented on previous MBSS.  Currently, patient exhibits s/s consistent with aspiration at bedside, including positive cough response with thin liquids and multiple swallows per each sip (indicating pharyngeal residue) Risk for Aspiration: Moderate Other Related Risk Factors: History of dysphagia;Previous CVA;Cognitive impairment  Recommendations Recommended Consults: MBS 12/8 am Solid Consistency: Dysphagia 2 (Fine chop) Liquid Consistency: Honey Liquid Administration via: Cup Medication Administration: Whole meds with puree Supervision: Full supervision/cueing for compensatory strategies Compensations: Slow rate;Small sips/bites Postural Changes and/or Swallow Maneuvers: Seated upright 90 degrees Oral Care Recommendations: Oral care QID;Staff/trained caregiver to provide oral care Other Recommendations: Prohibited food (jello, ice cream, thin soups);Clarify dietary  restrictions  Prognosis Prognosis for Safe Diet Advancement: Fair Barriers to Reach Goals: Cognitive deficits  Individuals Consulted Consulted and Agree with Results and Recommendations: Patient unable/family or caregiver not available;Patient;RN   Swallow Study Prior Functional Status     General  Other Pertinent Information: History of silent aspiration of thin liquids and trace penetration of Nectar.  Last MBS on 10/05/10 revealed a moderate oropharyngeal dysphagia with silent aspiration, and Dys. 3 diet with Nectar thick liquids was recommended.  Patient currently denies ever having dysphagia and reports he has been eatting and drinking whatever he wants at home.  A MBS will be needed due to h/o silent aspiration.MBS scheduled for 12/8.   A bedside clinical eval is being completed no to determine if patinet may be allowed p.o.'s prior to the MBS. Type of Study: Bedside swallow evaluation Diet Prior to this Study: Regular;Thin liquids (Per patient report.) Respiratory Status: Supplemental O2 delivered via (comment) (Nasal cannula.) Behavior/Cognition: Alert;Cooperative;Pleasant mood Oral Cavity - Dentition: Dentures, not available;Edentulous Vision: Functional for self-feeding Patient Positioning: Upright in bed Baseline Vocal Quality: Normal Volitional Cough: Weak Volitional Swallow: Able to elicit  Oral Motor/Sensory Function  Labial ROM: Within Functional Limits Labial Symmetry: Within Functional Limits Labial Strength: Within Functional Limits Labial Sensation: Within Functional Limits Lingual ROM: Within Functional Limits Lingual Symmetry: Within Functional Limits Lingual Strength: Within Functional Limits Lingual Sensation: Within Functional Limits Facial ROM: Within Functional Limits Facial Symmetry: Within Functional Limits Facial Strength: Within Functional Limits Facial Sensation: Within Functional Limits Velum: Within Functional Limits Mandible: Within Functional  Limits  Consistency Results     Thin Liquid Thin Liquid: Impaired Presentation: Cup;Straw Pharyngeal  Phase Impairments: Delayed Swallow;Multiple swallows;Cough - Delayed  Nectar Thick  Liquid Nectar Thick Liquid: Impaired Pharyngeal Phase Impairments: Multiple swallows;Delayed Swallow  Honey Thick Liquid Honey Thick Liquid: Within functional limits Presentation: Cup;Spoon  Puree Puree: Within functional limits Presentation: Spoon  Solid Solid: Not tested Other Comments: Dentures not available.   Maryjo Rochester T 02/24/2011,2:37 PM

## 2011-02-25 ENCOUNTER — Inpatient Hospital Stay (HOSPITAL_COMMUNITY): Payer: Medicare Other

## 2011-02-25 LAB — BASIC METABOLIC PANEL
BUN: 27 mg/dL — ABNORMAL HIGH (ref 6–23)
Calcium: 8.5 mg/dL (ref 8.4–10.5)
Creatinine, Ser: 1.2 mg/dL (ref 0.50–1.35)
GFR calc Af Amer: 69 mL/min — ABNORMAL LOW (ref >90)
GFR calc non Af Amer: 60 mL/min — ABNORMAL LOW (ref >90)

## 2011-02-25 LAB — CBC
Platelets: 143 10*3/uL — ABNORMAL LOW (ref 150–400)
RDW: 13.4 % (ref 11.5–15.5)
WBC: 11.2 10*3/uL — ABNORMAL HIGH (ref 4.0–10.5)

## 2011-02-25 LAB — MAGNESIUM: Magnesium: 2.4 mg/dL (ref 1.5–2.5)

## 2011-02-25 LAB — PHOSPHORUS: Phosphorus: 1.4 mg/dL — ABNORMAL LOW (ref 2.3–4.6)

## 2011-02-25 MED ORDER — ASPIRIN 325 MG PO TABS
325.0000 mg | ORAL_TABLET | Freq: Every day | ORAL | Status: DC
  Administered 2011-02-25 – 2011-02-26 (×2): 325 mg via ORAL
  Filled 2011-02-25 (×2): qty 1

## 2011-02-25 NOTE — Progress Notes (Signed)
Pt is a transfer from [facility]100 who lives at home with his wife. Diagnosis of HTN encephalopathy originally diagnosed with a code stroke/TIA that was canceled. A & O x 3 with periods of confusion due to old TIA's. On telemetry running NSR. Had a speech eval that recommended Dys 3 honey thick liquid. Full code, high fall risk, falls at home, with bed alarm on. Up with assist. Skin intact. CT (-), MRI showed atrophy. BP runs low. Peter Congo RN

## 2011-02-25 NOTE — Progress Notes (Signed)
Subjective: This is a 70 year old white male with a history of hypertension, and prior subarachnoid hemorrhage. The patient comes in with an episode of confusion. The patient does not recall any events concerning what had happened around the time of admission. Blood pressures were quite elevated initially, but are currently normal. The patient has returned to his usual baseline, alert and cooperative. MRI of the brain has been done, and then does not show evidence of an acute stroke event. An EEG study has been ordered, and is pending. The patient currently has no complaints. The patient denies any headache. Patient denies any numbness or weakness of the extremities.  Objective: Vital signs in last 24 hours: Temp:  [97.1 F (36.2 C)-98.6 F (37 C)] 97.1 F (36.2 C) (12/08 0800) Pulse Rate:  [64-91] 84  (12/08 0800) Resp:  [7-65] 65  (12/08 0800) BP: (89-113)/(47-82) 111/66 mmHg (12/08 0700) SpO2:  [94 %-99 %] 95 % (12/08 0800) Weight:  [76.6 kg (168 lb 14 oz)] 168 lb 14 oz (76.6 kg) (12/08 0500) Weight change: -2.5 kg (-5 lb 8.2 oz) Last BM Date: 02/24/11  Intake/Output from previous day: 12/07 0701 - 12/08 0700 In: 687.7 [P.O.:100; I.V.:87.7; IV Piggyback:500] Out: 1725 [Urine:1725] Intake/Output this shift:   Review of systems notable for no headache, visual field changes, numbness or weakness of the extremities. Patient denies any pain at this time.   Physical Examination:  Mental Status Exam: The patient is alert and cooperative at the time of the examination. The patient is oriented x3.  Motor Exam: The patient moves all 4 extremities, and has good strength bilaterally.  Cerebellar Exam: The patient has good finger-nose-finger and heel-to-shin bilaterally. Gait was not tested. There is no drift of the upper extremities.  Deep tendon reflexes: Deep tendon reflexes are symmetric throughout. Toes are downgoing bilaterally.  Cranial Nerve Exam: Facial symmetry is present.  Extraocular movements are full. Visual fields are full. Speech is well enunciated, no aphasia or dysarthria is noted.    Lab Results:  Basename 02/25/11 0500 02/24/11 0420  WBC 11.2* 17.8*  HGB 14.1 15.6  HCT 41.1 43.7  PLT 143* 163   BMET  Basename 02/25/11 0500 02/24/11 0420  NA 140 139  K 3.4* 3.1*  CL 104 101  CO2 29 26  GLUCOSE 100* 159*  BUN 27* 22  CREATININE 1.20 1.05  CALCIUM 8.5 8.7    Studies/Results: Ct Head Wo Contrast  02/23/2011  *RADIOLOGY REPORT*  Clinical Data: Headache with altered mental status and vomiting. Evaluate for stroke or hemorrhage.  CT HEAD WITHOUT CONTRAST  Technique:  Contiguous axial images were obtained from the base of the skull through the vertex without contrast.  Comparison: MR of the brain 10/04/2010.  Head CT 10/03/2010.  Findings: There is stable generalized prominence of the ventricles and subarachnoid spaces consistent with atrophy.  Chronic small vessel ischemic changes in the periventricular white matter are unchanged.  There is no evidence of acute intracranial hemorrhage, mass lesion, brain edema or extra-axial fluid collection.  There is no CT evidence of acute stroke.  Within the left maxillary sinus, there is a 1.4 cm polypoid lesion on image 1.  This was present on the prior MRI, although was partly obscured by the adjacent mucosal thickening.  The visualized paranasal sinuses are otherwise clear.  Calvarium is intact.  IMPRESSION:  1.  No CT evidence of acute stroke or hemorrhage. 2.  Stable chronic atrophy and small vessel ischemic changes. 3.  Polypoid left maxillary sinus  lesion suspicious for a polyp.  Critical Value/emergent results were called by telephone at the time of interpretation on 02/23/2011  at 1916 hours  to  Dr. Oletta Lamas, who verbally acknowledged these results.  Original Report Authenticated By: Gerrianne Scale, M.D.   Mr [name] Contrast  02/24/2011  *RADIOLOGY REPORT*  Clinical Data: Hypertensive encephalopathy  versus TIA. Hypertension.  Confusion  MRI HEAD WITHOUT AND WITH CONTRAST  Technique:  Multiplanar, multiecho pulse sequences of the brain and surrounding structures were obtained according to standard protocol without and with intravenous contrast  Contrast: 15mL MULTIHANCE GADOBENATE DIMEGLUMINE 529 MG/ML IV SOLN  Comparison:  CT 02/23/2011, MRI 10/04/2010.  Findings: Advanced atrophy.  Prominent subarachnoid space and prominent ventricles diffusely and unchanged from prior studies.  No evidence of hypertensive encephalopathy.  No edema in the posterior circulation.  Negative for acute infarct.  Mild chronic microvascular ischemia in the white matter.  Small micro hemorrhage left parietal lobe, unchanged from 10/04/2010.  No fluid collection or acute hemorrhage.  Chronic sinusitis with mucosal thickening in the maxillary ethmoid sinuses.  Soft tissue lesion within the left maxillary sinus attached to the medial wall.  This has enlarged since the prior MRI in 2008 and may represent a polyp or neoplasm.  This lesion measures 16 mm.  IMPRESSION: Extensive atrophy.  Mild chronic microvascular ischemia.  No acute infarct.  16 mm soft tissue lesion in the left maxillary sinus.  This may represent a polyp or neoplasm.  Original Report Authenticated By: Camelia Phenes, M.D.   Dg Chest Port 1 View  02/24/2011  *RADIOLOGY REPORT*  Clinical Data: Device position  PORTABLE CHEST - 1 VIEW  Comparison: 04/11/2010  Findings: Thorax is rotated to the left limiting evaluation.  The atrial closure device is grossly unchanged in position.  The right lung is clear.  The left lung is obscured by the cardiac silhouette.  No obvious pneumothorax.  IMPRESSION: Limited exam.  The atrial closure device is grossly unchanged in position.  Repeat chest x-ray with better positioning is recommended.  Original Report Authenticated By: Donavan Burnet, M.D.    Medications:  Scheduled:   . amLODipine  5 mg Oral Daily  . chlorthalidone  25  mg Oral Daily  . haloperidol lactate  5 mg Intravenous Once  . labetalol  100 mg Oral BID  . levothyroxine  88 mcg Oral Daily  . LORazepam  1 mg Intravenous Once  . olmesartan  20 mg Oral Daily  . potassium chloride  10 mEq Intravenous Q1 Hr x 5  . simvastatin  20 mg Oral q1800   Continuous:   . sodium chloride 20 mL/hr at 02/25/11 0700   ZOX:WRUEAV chloride, acetaminophen, gadobenate dimeglumine, labetalol, ondansetron  Assessment/Plan:  1. Altered mental status, resolved  2. Probable hypertensive encephalopathy  3. History of prior subarachnoid hemorrhage  The patient currently appears to be doing quite well. The patient and set up for an EEG study which is pending. MRI study evaluation does not show evidence of an acute stroke event. At this point, the stroke service we will sign off of this patient, and the patient will be transferred to the general neurology service. The patient should be on aspirin therapy.    LOS: 2 days   WILLIS,CHARLES KEITH 02/25/2011, 9:09 AM

## 2011-02-25 NOTE — Procedures (Signed)
Modified Barium Swallow Procedure Note Patient Details  Name: Benjamin Park MRN: [id number] Date of Birth: [birth year]  Today's Date: 02/25/2011 Time:  -     Past Medical History:  Past Medical History  Diagnosis Date  . Hypertension   . Hx of transient ischemic attack (TIA)   . Hyperlipidemia   . Coronary artery disease   . Throat cancer     radiation therapy  . Hypertensive encephalopathy   . ED (erectile dysfunction)   . Personal history of kidney stones   . Subarachnoid hemorrhage   . Sleep apnea     on CPAP  . Stroke   . Myocardial infarction     12/1991  . Headache    Past Surgical History:  Past Surgical History  Procedure Date  . Angioplasty 1993    LAD  . Cardiac catheterization   . Coronary angioplasty   . Basal cell cancer     head  . Hernia repair   . Appendectomy   . Patent foramen ovale closure 2008  . Eye surgery         Recommendation/Prognosis  Clinical Impression Dysphagia Diagnosis: Moderate oral phase dysphagia;Moderate pharyngeal phase dysphagia;Moderate cervical esophageal phase dysphagia Recommendations Recommended Consults: Consider GI evaluation;Consider esophageal assessment Solid Consistency: Dysphagia 3 (Mechanical soft) Liquid Consistency: Honey Liquid Administration via: Cup;No straw Medication Administration: Whole meds with puree Supervision: Full supervision/cueing for compensatory strategies Compensations: Slow rate;Small sips/bites;Clear throat intermittently;Multiple dry swallows after each bite/sip;Follow solids with liquid Postural Changes and/or Swallow Maneuvers: Seated upright 90 degrees;Upright 30-60 min after meal Oral Care Recommendations: Oral care before and after PO Other Recommendations: Order thickener from pharmacy;Clarify dietary restrictions;Prohibited food (jello, ice cream, thin soups);Remove water pitcher Prognosis Prognosis for Safe Diet Advancement: Good Barriers to Reach Goals: Cognitive  deficits;Motivation    SLP Assessment/Plan  Prior MBS completed revealed silent aspiration with thin liquids only. Patient continues with oral and pharyngeal dysphagia with continued silent penetration and silent aspiration before and during the swallow of thin liquid and now nectar thick liquids. Brief esophageal sweep revealed slow bolus transit with what appeared to be esophageal spasms. No radiologist present to confirm.  Recommend ST treatment in acute setting and at next level of care.  SLP Goals   Patient will consume recommended diet with no observed clinical s/s of aspiration with moderate verbal and visual cues to utilize strategies to decrease risk for aspiration.  General:  Date of Onset: 02/23/11 Other Pertinent Information: History of throat cancer with s/p radiation. Prior MBS with recommendations of Dysphagia 3 with nectar thick liquids. Type of Study: Initial MBS Diet Prior to this Study: Dysphagia 2 (chopped);Honey-thick liquids Temperature Spikes Noted: No Respiratory Status: Room air Behavior/Cognition: Cooperative;Requires cueing Oral Cavity - Dentition: Dentures, top;Dentures, bottom Vision: Functional for self-feeding Patient Positioning: Upright in chair Baseline Vocal Quality: Normal Volitional Cough: Strong Volitional Swallow: Able to elicit Ice chips: Not tested  Reason for Referral:  Assess risk for aspiration and to recommend safest diet with necessary precautions due to results of initial BSE.   Oral Phase Oral Preparation/Oral Phase Oral Phase: Impaired Oral - Nectar Oral - Nectar Cup: Piecemeal swallowing;Lingual/palatal residue;Weak lingual manipulation;Delayed oral transit Oral - Nectar Straw: Piecemeal swallowing;Decreased velopharyngeal closure;Delayed oral transit;Weak lingual manipulation Oral - Thin Oral - Thin Cup: Piecemeal swallowing;Decreased velopharyngeal closure;Delayed oral transit;Weak lingual manipulation Oral - Solids Oral -  Puree: Delayed oral transit;Decreased velopharyngeal closure;Weak lingual manipulation Oral - Regular: Weak lingual manipulation;Impaired mastication;Reduced posterior propulsion;Piecemeal swallowing;Delayed oral transit Pharyngeal  Phase  Pharyngeal Phase Pharyngeal Phase: Impaired Pharyngeal - Honey Pharyngeal - Honey Cup: Reduced airway/laryngeal closure;Reduced tongue base retraction;Pharyngeal residue - valleculae;Compensatory strategies attempted (Comment) (swallow x3 required to clear residuals in vallecular space) Pharyngeal - Nectar Pharyngeal - Nectar Cup: Reduced airway/laryngeal closure;Reduced tongue base retraction;Penetration/Aspiration before swallow;Penetration/Aspiration during swallow;Trace aspiration;Pharyngeal residue - valleculae;Pharyngeal residue - cp segment;Compensatory strategies attempted (Comment) (chin tuck ineffective in preventing penetration or aspiratio) Pharyngeal - Nectar Straw: Reduced airway/laryngeal closure;Penetration/Aspiration before swallow;Penetration/Aspiration during swallow Penetration/Aspiration details (nectar straw): Material enters airway, passes BELOW cords without attempt by patient to eject out (silent aspiration);Material enters airway, CONTACTS cords and not ejected out;Material does not enter airway Pharyngeal - Thin Pharyngeal - Thin Cup: Premature spillage to valleculae;Reduced airway/laryngeal closure;Penetration/Aspiration during swallow;Penetration/Aspiration before swallow Penetration/Aspiration details (thin cup): Material enters airway, remains ABOVE vocal cords and not ejected out;Material enters airway, passes BELOW cords without attempt by patient to eject out (silent aspiration) Pharyngeal - Solids Pharyngeal - Puree: Pharyngeal residue - cp segment;Pharyngeal residue - pyriform;Pharyngeal residue - valleculae;Reduced airway/laryngeal closure;Reduced tongue base retraction;Compensatory strategies attempted (Comment) (swallow x3  cleared majority of residue from vallecular space) Pharyngeal - Regular: Pharyngeal residue - posterior pharnyx;Pharyngeal residue - pyriform;Pharyngeal residue - valleculae;Pharyngeal residue - cp segment Pharyngeal - Pill: Reduced airway/laryngeal closure Cervical Esophageal Phase  Cervical Esophageal Phase Cervical Esophageal Phase: Impaired Cervical Esophageal Phase - Solids Mechanical Soft: Reduced cricopharyngeal relaxation;Prominent cricopharyngeal segment;Esophageal backflow into cervical esophagus Pill: Reduced cricopharyngeal relaxation;Prominent cricopharyngeal segment;Esophageal backflow into cervical esophagus Cervical Esophageal Phase - Comment Cervical Esophageal Comment: Dry effortful swallows x3 required to clear stasis in cervical esophagus   Moreen Fowler, M.S., CCC-SLP [phone]  [facility] 02/25/2011, 6:43 PM

## 2011-02-25 NOTE — Progress Notes (Addendum)
Benjamin Park is a 70 y.o. male admitted on 02/23/2011 with confusion likely from HTN encephalopathy PMHx TIA, SAH, CAD, Throat cancer s/p XRT, OSA  Tests: CT head 12/04>>No CT evidence of acute stroke or hemorrhage.  Stable chronic atrophy and small vessel ischemic changes.  Polypoid left maxillary sinus lesion suspicious for a polyp. MRI brain 12/07>>Extensive atrophy.  Mild chronic microvascular ischemia. No acute infarct.  16 mm soft tissue lesion in the left maxillary sinus. This may represent a polyp or neoplasm.   SUBJECTIVE: Feels good.  Denies headache, chest pain, dyspnea, abdominal pain  OBJECTIVE:  Blood pressure 111/66, pulse 84, temperature 97.1 F (36.2 C), temperature source Oral, resp. rate 65, height 6' (1.829 m), weight 168 lb 14 oz (76.6 kg), SpO2 95.00%.   Intake/Output Summary (Last 24 hours) at 02/25/11 0930 Last data filed at 02/25/11 0600  Gross per 24 hour  Intake 647.67 ml  Output   1630 ml  Net -982.33 ml   General - pleasant HEENT - no oral exudate, no LAN Cardiac - s1s2 regular Chest - no wheeze/rales Abd - soft, nontender Ext - no edema Neuro - normal strength, moves all extremities  Lab Results  Component Value Date   CREATININE 1.20 02/25/2011   BUN 27* 02/25/2011   NA 140 02/25/2011   K 3.4* 02/25/2011   CL 104 02/25/2011   CO2 29 02/25/2011   Lab Results  Component Value Date   WBC 11.2* 02/25/2011   HGB 14.1 02/25/2011   HCT 41.1 02/25/2011   MCV 95.4 02/25/2011   PLT 143* 02/25/2011    Ct Head Wo Contrast  02/23/2011  *RADIOLOGY REPORT*  Clinical Data: Headache with altered mental status and vomiting. Evaluate for stroke or hemorrhage.  CT HEAD WITHOUT CONTRAST  Technique:  Contiguous axial images were obtained from the base of the skull through the vertex without contrast.  Comparison: MR of the brain 10/04/2010.  Head CT 10/03/2010.  Findings: There is stable generalized prominence of the ventricles and subarachnoid spaces consistent with  atrophy.  Chronic small vessel ischemic changes in the periventricular white matter are unchanged.  There is no evidence of acute intracranial hemorrhage, mass lesion, brain edema or extra-axial fluid collection.  There is no CT evidence of acute stroke.  Within the left maxillary sinus, there is a 1.4 cm polypoid lesion on image 1.  This was present on the prior MRI, although was partly obscured by the adjacent mucosal thickening.  The visualized paranasal sinuses are otherwise clear.  Calvarium is intact.  IMPRESSION:  1.  No CT evidence of acute stroke or hemorrhage. 2.  Stable chronic atrophy and small vessel ischemic changes. 3.  Polypoid left maxillary sinus lesion suspicious for a polyp.  Critical Value/emergent results were called by telephone at the time of interpretation on 02/23/2011  at 1916 hours  to  Dr. Oletta Lamas, who verbally acknowledged these results.  Original Report Authenticated By: Gerrianne Scale, M.D.   Mr [name] Contrast  02/24/2011  *RADIOLOGY REPORT*  Clinical Data: Hypertensive encephalopathy versus TIA. Hypertension.  Confusion  MRI HEAD WITHOUT AND WITH CONTRAST  Technique:  Multiplanar, multiecho pulse sequences of the brain and surrounding structures were obtained according to standard protocol without and with intravenous contrast  Contrast: 15mL MULTIHANCE GADOBENATE DIMEGLUMINE 529 MG/ML IV SOLN  Comparison:  CT 02/23/2011, MRI 10/04/2010.  Findings: Advanced atrophy.  Prominent subarachnoid space and prominent ventricles diffusely and unchanged from prior studies.  No evidence of hypertensive encephalopathy.  No edema in the posterior circulation.  Negative for acute infarct.  Mild chronic microvascular ischemia in the white matter.  Small micro hemorrhage left parietal lobe, unchanged from 10/04/2010.  No fluid collection or acute hemorrhage.  Chronic sinusitis with mucosal thickening in the maxillary ethmoid sinuses.  Soft tissue lesion within the left maxillary sinus  attached to the medial wall.  This has enlarged since the prior MRI in 2008 and may represent a polyp or neoplasm.  This lesion measures 16 mm.  IMPRESSION: Extensive atrophy.  Mild chronic microvascular ischemia.  No acute infarct.  16 mm soft tissue lesion in the left maxillary sinus.  This may represent a polyp or neoplasm.  Original Report Authenticated By: Camelia Phenes, M.D.   Dg Chest Port 1 View  02/25/2011  *RADIOLOGY REPORT*  Clinical Data: Coronary artery disease.  Previous closure of patent [place].  PORTABLE CHEST - 1 VIEW  Comparison: 02/24/2011  Findings: Both lungs remain clear.  Heart size is stable.  IMPRESSION: No acute findings.  Original Report Authenticated By: Danae Orleans, M.D.   Dg Chest Port 1 View  02/24/2011  *RADIOLOGY REPORT*  Clinical Data: Device position  PORTABLE CHEST - 1 VIEW  Comparison: 04/11/2010  Findings: Thorax is rotated to the left limiting evaluation.  The atrial closure device is grossly unchanged in position.  The right lung is clear.  The left lung is obscured by the cardiac silhouette.  No obvious pneumothorax.  IMPRESSION: Limited exam.  The atrial closure device is grossly unchanged in position.  Repeat chest x-ray with better positioning is recommended.  Original Report Authenticated By: Donavan Burnet, M.D.    ASSESSMENT/PLAN:  TME likely secondary to HTN encephalopathy with Hx of TIA and SAH -improved -neurology following -f/u EEG  HTN -BP improved -continue amlodipine, labetalol, olmesartan, chlorthalidone  Hx of CAD, hyperlipidemia -continue ASA, simvastatin, labetalol  Hx of hypothyroidism -continue levothyroxine  Hx of OSA -CPAP qhs  Dysphagia -D2 diet, honey thick liquids  Debility -OOB to chair -PT/OT eval  Disposition -transfer to telemetry  Transfer service to [facility] from 12/09 and PCCM will sign off.  Iridessa Harrow Pager:  [phone] 02/25/2011, 9:30 AM

## 2011-02-26 LAB — CBC
HCT: 41.4 % (ref 39.0–52.0)
Hemoglobin: 14.3 g/dL (ref 13.0–17.0)
RDW: 13.4 % (ref 11.5–15.5)
WBC: 8.6 10*3/uL (ref 4.0–10.5)

## 2011-02-26 LAB — BASIC METABOLIC PANEL
Chloride: 105 mEq/L (ref 96–112)
GFR calc Af Amer: 70 mL/min — ABNORMAL LOW (ref >90)
Potassium: 3.3 mEq/L — ABNORMAL LOW (ref 3.5–5.1)

## 2011-02-26 MED ORDER — STARCH (THICKENING) PO POWD
ORAL | Status: DC | PRN
  Filled 2011-02-26: qty 227

## 2011-02-26 MED ORDER — POTASSIUM CHLORIDE CRYS ER 20 MEQ PO TBCR
40.0000 meq | EXTENDED_RELEASE_TABLET | Freq: Once | ORAL | Status: AC
  Administered 2011-02-26: 40 meq via ORAL
  Filled 2011-02-26: qty 2

## 2011-02-26 MED ORDER — STARCH (THICKENING) PO POWD: 1.0000 | ORAL | Status: DC | PRN

## 2011-02-26 NOTE — Discharge Summary (Signed)
Physician Discharge Summary  Patient ID: Benjamin Park MRN: [id number] DOB/AGE: 1940-05-09 70 y.o. Primary Care Physician:ROBERTS, Vernie Ammons, MD, MD Admit date: 02/23/2011 Discharge date: 02/26/2011    Discharge Diagnoses:  1. Hypertensive encephalopathy, improved. 2. Hypertension, controlled. 3. Cerebrovascular disease. 4. Coronary artery disease, stable.   Current Discharge Medication List    START taking these medications   Details  food thickener (THICK IT) POWD Take 227 g by mouth as needed (honey thick liquids). Qty: 30 Can, Refills: 0      CONTINUE these medications which have NOT CHANGED   Details  amLODipine (NORVASC) 5 MG tablet Take 5 mg by mouth daily.      aspirin 325 MG tablet Take 325 mg by mouth daily.      chlorthalidone (HYGROTON) 25 MG tablet Take 25 mg by mouth daily. Taking 1/2 daily     fish oil-omega-3 fatty acids 1000 MG capsule Take 1 g by mouth daily.     GLUCOSAMINE PO Take 1 tablet by mouth daily.     labetalol (NORMODYNE) 200 MG tablet Take 1/2 tablet twice a day Qty: 90 tablet, Refills: 3    levothyroxine (SYNTHROID, LEVOTHROID) 88 MCG tablet Take 88 mcg by mouth daily.     lovastatin (MEVACOR) 40 MG tablet Take 40 mg by mouth at bedtime.      Multiple Vitamin (MULTIVITAMIN) tablet Take 1 tablet by mouth daily.      olmesartan (BENICAR) 20 MG tablet Take 20 mg by mouth daily.          Discharged Condition: Improved and stable.    Consults: Neurology.  Significant Diagnostic Studies: Ct Head Wo Contrast  02/23/2011  *RADIOLOGY REPORT*  Clinical Data: Headache with altered mental status and vomiting. Evaluate for stroke or hemorrhage.  CT HEAD WITHOUT CONTRAST  Technique:  Contiguous axial images were obtained from the base of the skull through the vertex without contrast.  Comparison: MR of the brain 10/04/2010.  Head CT 10/03/2010.  Findings: There is stable generalized prominence of the ventricles and subarachnoid spaces  consistent with atrophy.  Chronic small vessel ischemic changes in the periventricular white matter are unchanged.  There is no evidence of acute intracranial hemorrhage, mass lesion, brain edema or extra-axial fluid collection.  There is no CT evidence of acute stroke.  Within the left maxillary sinus, there is a 1.4 cm polypoid lesion on image 1.  This was present on the prior MRI, although was partly obscured by the adjacent mucosal thickening.  The visualized paranasal sinuses are otherwise clear.  Calvarium is intact.  IMPRESSION:  1.  No CT evidence of acute stroke or hemorrhage. 2.  Stable chronic atrophy and small vessel ischemic changes. 3.  Polypoid left maxillary sinus lesion suspicious for a polyp.  Critical Value/emergent results were called by telephone at the time of interpretation on 02/23/2011  at 1916 hours  to  Dr. Oletta Lamas, who verbally acknowledged these results.  Original Report Authenticated By: Gerrianne Scale, M.D.   Mr [name] Contrast  02/24/2011  *RADIOLOGY REPORT*  Clinical Data: Hypertensive encephalopathy versus TIA. Hypertension.  Confusion  MRI HEAD WITHOUT AND WITH CONTRAST  Technique:  Multiplanar, multiecho pulse sequences of the brain and surrounding structures were obtained according to standard protocol without and with intravenous contrast  Contrast: 15mL MULTIHANCE GADOBENATE DIMEGLUMINE 529 MG/ML IV SOLN  Comparison:  CT 02/23/2011, MRI 10/04/2010.  Findings: Advanced atrophy.  Prominent subarachnoid space and prominent ventricles diffusely and unchanged from prior studies.  No evidence of hypertensive encephalopathy.  No edema in the posterior circulation.  Negative for acute infarct.  Mild chronic microvascular ischemia in the white matter.  Small micro hemorrhage left parietal lobe, unchanged from 10/04/2010.  No fluid collection or acute hemorrhage.  Chronic sinusitis with mucosal thickening in the maxillary ethmoid sinuses.  Soft tissue lesion within the left  maxillary sinus attached to the medial wall.  This has enlarged since the prior MRI in 2008 and may represent a polyp or neoplasm.  This lesion measures 16 mm.  IMPRESSION: Extensive atrophy.  Mild chronic microvascular ischemia.  No acute infarct.  16 mm soft tissue lesion in the left maxillary sinus.  This may represent a polyp or neoplasm.  Original Report Authenticated By: Camelia Phenes, M.D.   Dg Chest Port 1 View  02/25/2011  *RADIOLOGY REPORT*  Clinical Data: Coronary artery disease.  Previous closure of patent foraminal valley.  PORTABLE CHEST - 1 VIEW  Comparison: 02/24/2011  Findings: Both lungs remain clear.  Heart size is stable.  IMPRESSION: No acute findings.  Original Report Authenticated By: Danae Orleans, M.D.   Dg Chest Port 1 View  02/24/2011  *RADIOLOGY REPORT*  Clinical Data: Device position  PORTABLE CHEST - 1 VIEW  Comparison: 04/11/2010  Findings: Thorax is rotated to the left limiting evaluation.  The atrial closure device is grossly unchanged in position.  The right lung is clear.  The left lung is obscured by the cardiac silhouette.  No obvious pneumothorax.  IMPRESSION: Limited exam.  The atrial closure device is grossly unchanged in position.  Repeat chest x-ray with better positioning is recommended.  Original Report Authenticated By: Donavan Burnet, M.D.   Dg Swallowing Func-no Report  02/25/2011  CLINICAL DATA: Dysphagia   FLUOROSCOPY FOR SWALLOWING FUNCTION STUDY:  Fluoroscopy was provided for swallowing function study, which was  administered by a speech pathologist.  Final results and recommendations  from this study are contained within the speech pathology report.      Lab Results: Basic Metabolic Panel:  Basename 02/26/11 0500 02/25/11 0500  NA 141 140  K 3.3* 3.4*  CL 105 104  CO2 28 29  GLUCOSE 94 100*  BUN 29* 27*  CREATININE 1.19 1.20  CALCIUM 9.1 8.5  MG -- 2.4  PHOS -- 1.4*   Liver Function Tests:  [facility] 02/23/11 1907  AST 37  ALT 19    ALKPHOS 74  BILITOT 0.3  PROT 7.8  ALBUMIN 4.0     CBC:  Basename 02/26/11 0500 02/25/11 0500 02/23/11 1907  WBC 8.6 11.2* --  NEUTROABS -- -- 4.2  HGB 14.3 14.1 --  HCT 41.4 41.1 --  MCV 95.0 95.4 --  PLT 140* 143* --    Recent Results (from the past 240 hour(s))  MRSA PCR SCREENING     Status: Normal   Collection Time   02/23/11 11:22 PM      Component Value Range Status Comment   MRSA by PCR NEGATIVE  NEGATIVE  Final      Hospital Course: This 70 year old man was admitted with confusion. His blood pressure was noted to be extremely high at 203/155. He was admitted to the intensive care unit and stabilized with blood pressure control. Eventually he was able to be transitioned to his home medications which she has tolerated well. MRI of the brain did not show any new stroke. He was seen by neurology and there were no new recommendations apart from controlling blood pressure. He  feels back to his usual self now with underlying unsteadiness of gait which she has had since his stroke earlier this year in July 2012.  Discharge Exam: Blood pressure 143/87, pulse 70, temperature 97.6 F (36.4 C), temperature source Oral, resp. rate 18, height 6' (1.829 m), weight 78.1 kg (172 lb 2.9 oz), SpO2 95.00%. He looks systemically well. He is alert and orientated without any focal neurological signs. Heart sounds are present and normal. Lung fields are clear.  Disposition: Home. He has been instructed to keep a very close check on his blood pressure every day. I've advised him to followup with his primary care physician in the next day or 2. I've also instructed to not to drive any vehicle for the time being.  Discharge Orders    Future Appointments: Provider: Department: Dept Phone: Center:   04/10/2011 9:30 AM Peter [place], MD [facility] Cardiology [phone] None     Future Orders Please Complete By Expires   Diet - low sodium heart healthy      Increase activity slowly      Discharge  instructions      Comments:   NO DRIVING!      Follow-up Information    Follow up with Lorenda Peck, MD .         Signed: Wilson Singer 02/26/2011, 2:40 PM

## 2011-02-26 NOTE — Progress Notes (Signed)
Gave patient AVS, follow up appointments, new prescription and medication administration times. D/C both IV's , unremarkable. Reiterated that patient needs to stop driving. All questions answered. Patient left for home with wife accompanied by nurse tech in wheelchair. Peter Congo RN

## 2011-02-27 NOTE — ED Provider Notes (Signed)
Please see my addendum note.  Jadis Pitter Y. Debbra Digiulio, MD 02/27/11 1141 

## 2011-02-28 MED ORDER — GADOBENATE DIMEGLUMINE 529 MG/ML IV SOLN
15.0000 mL | Freq: Once | INTRAVENOUS | Status: AC | PRN
  Administered 2011-02-28: 15 mL via INTRAVENOUS

## 2011-02-28 NOTE — Procedures (Signed)
EEG NUMBER:  [id number].  HISTORY:  This is a 70 years old man with altered mental status, per report.  MEDICATIONS:  Ativan, per report.  CONDITION OF RECORDING:  This 16-lead EEG was performed with this patient in an awake and drowsy state.  Background rhythm:  Background patterns in wakefulness were well organized with a well-sustained posterior dominant rhythm of 10 Hz, symmetrical and reactive to eye opening and closing.  Abnormal potentials:  Intermittent right Posterior-temporal and occipital slowing was noted.  No epileptiform activity was noted.  ACTIVATION PROCEDURES:  Hyperventilation was not performed.  Photic stimulation did not activate tracing.  EKG:  Single-channel of EKG monitoring detected tachycardia and an irregular rhythm.  IMPRESSION:  This is an abnormal awake EEG due to presence of intermittent right posterior-temporal and occipital slowing.  This finding may be suggestive of an underlying structural or functional abnormality of any etiology in that head region. Single-channel of EKG monitoring detected tachycardia and an irregular rhythm. If clinically warranted, clinical correlation is suggested.          ______________________________ Carmell Austria, MD    [id number] D:  02/25/2011 07:56:23  T:  02/25/2011 09:03:48  Job #:  [id number]

## 2011-03-19 ENCOUNTER — Observation Stay (HOSPITAL_COMMUNITY)
Admission: EM | Admit: 2011-03-19 | Discharge: 2011-03-21 | Disposition: A | Discharge status: Home / Self Care | Payer: Medicare Other | Attending: Internal Medicine | Admitting: Internal Medicine

## 2011-03-19 ENCOUNTER — Emergency Department (HOSPITAL_COMMUNITY): Payer: Medicare Other

## 2011-03-19 ENCOUNTER — Other Ambulatory Visit: Payer: Self-pay

## 2011-03-19 ENCOUNTER — Encounter (HOSPITAL_COMMUNITY): Payer: Self-pay | Admitting: *Deleted

## 2011-03-19 DIAGNOSIS — Z923 Personal history of irradiation: Secondary | ICD-10-CM | POA: Insufficient documentation

## 2011-03-19 DIAGNOSIS — Z85819 Personal history of malignant neoplasm of unspecified site of lip, oral cavity, and pharynx: Secondary | ICD-10-CM | POA: Insufficient documentation

## 2011-03-19 DIAGNOSIS — I2510 Atherosclerotic heart disease of native coronary artery without angina pectoris: Secondary | ICD-10-CM | POA: Insufficient documentation

## 2011-03-19 DIAGNOSIS — G4730 Sleep apnea, unspecified: Secondary | ICD-10-CM | POA: Insufficient documentation

## 2011-03-19 DIAGNOSIS — Z7982 Long term (current) use of aspirin: Secondary | ICD-10-CM | POA: Insufficient documentation

## 2011-03-19 DIAGNOSIS — R55 Syncope and collapse: Principal | ICD-10-CM | POA: Diagnosis present

## 2011-03-19 DIAGNOSIS — N529 Male erectile dysfunction, unspecified: Secondary | ICD-10-CM | POA: Insufficient documentation

## 2011-03-19 DIAGNOSIS — I10 Essential (primary) hypertension: Secondary | ICD-10-CM | POA: Diagnosis present

## 2011-03-19 DIAGNOSIS — E876 Hypokalemia: Secondary | ICD-10-CM | POA: Insufficient documentation

## 2011-03-19 DIAGNOSIS — Z85828 Personal history of other malignant neoplasm of skin: Secondary | ICD-10-CM | POA: Insufficient documentation

## 2011-03-19 DIAGNOSIS — E860 Dehydration: Secondary | ICD-10-CM | POA: Diagnosis present

## 2011-03-19 DIAGNOSIS — Z9861 Coronary angioplasty status: Secondary | ICD-10-CM | POA: Insufficient documentation

## 2011-03-19 DIAGNOSIS — Z8673 Personal history of transient ischemic attack (TIA), and cerebral infarction without residual deficits: Secondary | ICD-10-CM | POA: Insufficient documentation

## 2011-03-19 DIAGNOSIS — I252 Old myocardial infarction: Secondary | ICD-10-CM | POA: Insufficient documentation

## 2011-03-19 DIAGNOSIS — Z79899 Other long term (current) drug therapy: Secondary | ICD-10-CM | POA: Insufficient documentation

## 2011-03-19 LAB — BASIC METABOLIC PANEL
CO2: 32 mEq/L (ref 19–32)
Calcium: 9.3 mg/dL (ref 8.4–10.5)
Chloride: 106 mEq/L (ref 96–112)
Glucose, Bld: 103 mg/dL — ABNORMAL HIGH (ref 70–99)
Sodium: 143 mEq/L (ref 135–145)

## 2011-03-19 LAB — CBC
Hemoglobin: 14.4 g/dL (ref 13.0–17.0)
MCH: 32.4 pg (ref 26.0–34.0)
RBC: 4.45 MIL/uL (ref 4.22–5.81)

## 2011-03-19 NOTE — ED Notes (Signed)
Patient transported to X-ray 

## 2011-03-19 NOTE — ED Notes (Signed)
Pt wife Rhonin Trott can be reached at [phone]

## 2011-03-19 NOTE — ED Provider Notes (Signed)
History     CSN: [id number]  Arrival date & time 03/19/11  1216   First MD Initiated Contact with Patient 03/19/11 1227      Chief Complaint  Patient presents with  . Loss of Consciousness    (Consider location/radiation/quality/duration/timing/severity/associated sxs/prior treatment) HPI Stent presents after syncopal episode. 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 He awoke on the floor. He currently denies any symptoms. He did not have chest pain palpitations headache or shortness of breath prior to syncope. He denies having syncope in the past. He has had a hemorrhagic stroke and a recent admission for hypertensive encephalopathy. He states he had been feeling well until this episode occurred today. He has no focal weakness, no changes in his vision, no changes in his speech. There are no other modifying or alleviating factors.  He denies any recent illness or other associated systemic symptoms  Past Medical History  Diagnosis Date  . Hypertension   . Hx of transient ischemic attack (TIA)   . Hyperlipidemia   . Coronary artery disease   . Throat cancer     radiation therapy  . Hypertensive encephalopathy   . ED (erectile dysfunction)   . Personal history of kidney stones   . Subarachnoid hemorrhage   . Sleep apnea     on CPAP  . Stroke   . Myocardial infarction     12/1991  . Headache     Past Surgical History  Procedure Date  . Angioplasty 1993    LAD  . Cardiac catheterization   . Coronary angioplasty   . Basal cell cancer     head  . Hernia repair   . Appendectomy   . Patent foramen ovale closure 2008  . Eye surgery     Family History  Problem Relation Age of Onset  . Heart failure Father   . Heart attack Brother   . Anemia Brother 24    mi    History  Substance Use Topics  . Smoking status: Former Smoker    Types: Cigarettes    Quit date: 09/21/1990  . Smokeless tobacco: Not on  file  . Alcohol Use: No      Review of Systems ROS reviewed and otherwise negative except for mentioned in HPI  Allergies  Review of patient's allergies indicates no known allergies.  Home Medications   Current Outpatient Rx  Name Route Sig Dispense Refill  . ASPIRIN 325 MG PO TABS Oral Take 325 mg by mouth daily.      . CHLORTHALIDONE 25 MG PO TABS Oral Take 25 mg by mouth daily.     Marland Kitchen CLONIDINE HCL 0.2 MG PO TABS Oral Take 0.2 mg by mouth 2 (two) times daily as needed. For blood pressure greater than 160/100     . OMEGA-3 FATTY ACIDS 1000 MG PO CAPS Oral Take 1 g by mouth daily.     Marland Kitchen STARCH (THICKENING) PO POWD Oral Take 227 g by mouth as needed (honey thick liquids). 30 Can 0  . GLUCOSAMINE PO Oral Take 1 tablet by mouth daily.     Marland Kitchen LABETALOL HCL 100 MG PO TABS Oral Take 100 mg by mouth 2 (two) times daily.      Marland Kitchen LEVOTHYROXINE SODIUM 100 MCG PO TABS Oral Take 100 mcg by mouth daily.      Marland Kitchen LOVASTATIN 40 MG PO TABS Oral Take 40 mg by mouth at bedtime.      Marland Kitchen  ADULT MULTIVITAMIN W/MINERALS CH Oral Take 1 tablet by mouth daily.      Marland Kitchen OLMESARTAN MEDOXOMIL 20 MG PO TABS Oral Take 10 mg by mouth daily.       BP 130/88  Pulse 76  Temp(Src) 97.8 F (36.6 C) (Oral)  Resp 18  SpO2 100% Vitals reviewed Physical Exam Physical Examination: General appearance - alert, well appearing, and in no distress Mental status - alert, oriented to person, place, and time Mouth - mucous membranes moist, pharynx normal without lesions Neck - supple, no significant adenopathy Chest - clear to auscultation, no wheezes, rales or rhonchi, symmetric air entry Heart - normal rate, regular rhythm, normal S1, S2, no murmurs, rubs, clicks or gallops Abdomen - soft, nontender, nondistended, no masses or organomegaly Back exam - full range of motion, no tenderness, palpable spasm or pain on motion Neurological - alert, oriented, normal speech, no focal findings, strength 5/5 in extremities x 4, cranial  nerves 2-12 tested and intact, sensation intact to light touch in extremities x 4 Musculoskeletal - no joint tenderness, deformity or swelling Extremities - peripheral pulses normal, no pedal edema, no clubbing or cyanosis Skin - normal coloration and turgor, no rashes  ED Course  Procedures (including critical care time)  Date: 03/19/2011  Rate: 64  Rhythm: normal sinus rhythm  QRS Axis: normal  Intervals: normal  ST/T Wave abnormalities: normal  Conduction Disutrbances:none  Narrative Interpretation:   Old EKG Reviewed: unchanged no significant changes compared to prior of 02/24/11    Labs Reviewed  BASIC METABOLIC PANEL - Abnormal; Notable for the following:    Glucose, Bld 103 (*)    GFR calc non Af Amer 55 (*)    GFR calc Af Amer 63 (*)    All other components within normal limits  CBC  POCT I-STAT TROPONIN I  I-STAT TROPONIN I   Dg Chest 2 View  03/19/2011  *RADIOLOGY REPORT*  Clinical Data: Syncope and shortness of breath.  CHEST - 2 VIEW  Comparison: 02/25/2011  Findings: Stable chronic lung disease.  Stable appearance of occluder device within the heart.  No edema, infiltrate or pleural fluid identified.  Heart size is within normal limits.  IMPRESSION: Stable chronic lung disease.  No acute findings.  Original Report Authenticated By: Reola Calkins, M.D.   Ct Head Wo Contrast  03/19/2011  *RADIOLOGY REPORT*  Clinical Data: Near-syncope  CT HEAD WITHOUT CONTRAST  Technique:  Contiguous axial images were obtained from the base of the skull through the vertex without contrast.  Comparison: 02/23/2011  Findings: No skull fracture is noted. Again noted nodular lesion in the left maxillary sinus measures 1.4 cm.  This may represent a polyp or mucous retention cyst.  Stable from prior exam.  No intracranial hemorrhage, mass effect or midline shift.  Mastoid air cells are unremarkable.  Stable cerebral atrophy and chronic white matter disease.  No definite acute infarction.   No mass lesion is noted on this unenhanced scan.  IMPRESSION:  1.  Stable atrophy and chronic white matter disease.  No definite acute cortical infarction.  Stable nodular lesion in the left maxillary sinus.  Original Report Authenticated By: Natasha Mead, M.D.   3:36 PM pt updated about findings.  Discussed plan for admission with him.  D/w triad hospitalist- he will be admitted to team 3, telemetry bed.   1. Syncope       MDM  Patient presenting after syncopal event today. He has a normal examination at this time  including full normal neurologic examination. He is mildly hypertensive. His EKG is sinus rhythm without any acute changes. He is not anemic. He has not had any prior episodes of syncope and will be admitted to telemetry bed to the triad service for further evaluation and management. He is agreeable with the plan for admission and has been updated on all of the findings thus far.        Ethelda Chick, MD 03/19/11 [phone]

## 2011-03-19 NOTE — ED Notes (Signed)
GEX:BM84<XL> Expected date:03/19/11<BR> Expected time:12:12 PM<BR> Means of arrival:Ambulance<BR> Comments:<BR> EMS 41 GC - syncope

## 2011-03-19 NOTE — ED Notes (Signed)
Admitting MD at bedside.

## 2011-03-19 NOTE — ED Notes (Signed)
Per EMS.  Pt was at church and patient noticed feeling more weak.  Pt reports taking two BP medications and did not eat and thought that was why. Pt alert and oriented x4. Vitals WNL.  Pt had TIA December 6th. Negative stroke scale. Pt reports no complaints at this time. Patient has 20G in left hand.  12 lead unremarkable with NS.

## 2011-03-19 NOTE — ED Notes (Signed)
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 Pt reports he lost consciousness and woke up on the floor. Pt reports he feels fine right now. Pt reports this has happened before on December 6th when he had a TIA.  Pt reports his symptoms were more severe then with deficits.  Alert and oriented x4.  Facial expressions equal, speech clear. Bilateral extremities equal.

## 2011-03-19 NOTE — H&P (Signed)
PCP:   Lorenda Peck, MD, MD   Chief Complaint:  syncope  HPI: This is a 70 year old gentleman with history of hypertension, TIAs, subarachnoid hemorrhage. The patient was at church today when he had an episode of syncope. He reports feeling "something did not feel right" prior to passing out. He is unable to me exactly what he felt. Denies any shortness of breath, chest pain, dizziness prior to passing out. He had passed out for unknown period of time. Does not appear to have any head trauma. Post syncope, he did not have any confusion, no tremors or jerking was noted during the event. He was brought to the ER for evaluation where workup has thus far been unremarkable. He denies any symptoms at this time. He has been referred for observation for further workup.  Allergies:  No Known Allergies    Past Medical History  Diagnosis Date  . Hypertension   . Hx of transient ischemic attack (TIA)   . Hyperlipidemia   . Coronary artery disease   . Throat cancer     radiation therapy  . Hypertensive encephalopathy   . ED (erectile dysfunction)   . Personal history of kidney stones   . Subarachnoid hemorrhage   . Sleep apnea     on CPAP  . Stroke   . Myocardial infarction     12/1991  . Headache     Past Surgical History  Procedure Date  . Angioplasty 1993    LAD  . Cardiac catheterization   . Coronary angioplasty   . Basal cell cancer     head  . Hernia repair   . Appendectomy   . Patent foramen ovale closure 2008  . Eye surgery     Prior to Admission medications   Medication Sig Start Date End Date Taking? Authorizing Provider  aspirin 325 MG tablet Take 325 mg by mouth daily.     Yes Historical Provider, MD  chlorthalidone (HYGROTON) 25 MG tablet Take 25 mg by mouth daily.    Yes Historical Provider, MD  cloNIDine (CATAPRES) 0.2 MG tablet Take 0.2 mg by mouth 2 (two) times daily as needed. For blood pressure greater than 160/100    Yes Historical Provider, MD    fish oil-omega-3 fatty acids 1000 MG capsule Take 1 g by mouth daily.    Yes Historical Provider, MD  food thickener (THICK IT) POWD Take 227 g by mouth as needed (honey thick liquids). 02/26/11  Yes Nimish C Gosrani  GLUCOSAMINE PO Take 1 tablet by mouth daily.    Yes Historical Provider, MD  labetalol (NORMODYNE) 100 MG tablet Take 100 mg by mouth 2 (two) times daily.     Yes Historical Provider, MD  levothyroxine (SYNTHROID, LEVOTHROID) 100 MCG tablet Take 100 mcg by mouth daily.     Yes Historical Provider, MD  lovastatin (MEVACOR) 40 MG tablet Take 40 mg by mouth at bedtime.     Yes Historical Provider, MD  Multiple Vitamin (MULITIVITAMIN WITH MINERALS) TABS Take 1 tablet by mouth daily.     Yes Historical Provider, MD  olmesartan (BENICAR) 20 MG tablet Take 10 mg by mouth daily.    Yes Historical Provider, MD    Social History:  reports that he quit smoking about 20 years ago. His smoking use included Cigarettes. He does not have any smokeless tobacco history on file. He reports that he does not drink alcohol. His drug history not on file.  Family History  Problem Relation Age of  Onset  . Heart failure Father   . Heart attack Brother   . Anemia Brother 48    mi    Review of Systems: Positives in bold Constitutional: Denies fever, chills, diaphoresis, appetite change and fatigue.  HEENT: Denies photophobia, eye pain, redness, hearing loss, ear pain, congestion, sore throat, rhinorrhea, sneezing, mouth sores, trouble swallowing, neck pain, neck stiffness and tinnitus.   Respiratory: Denies SOB, DOE, cough, chest tightness,  and wheezing.   Cardiovascular: Denies chest pain, palpitations and leg swelling.  Gastrointestinal: Denies nausea, vomiting, abdominal pain, diarrhea, constipation, blood in stool and abdominal distention.  Genitourinary: Denies dysuria, urgency, frequency, hematuria, flank pain and difficulty urinating.  Musculoskeletal: Denies myalgias, back pain, joint  swelling, arthralgias and gait problem.  Skin: Denies pallor, rash and wound.  Neurological: Denies dizziness, seizures, syncope, weakness, light-headedness, numbness and headaches.  Hematological: Denies adenopathy. Easy bruising, personal or family bleeding history  Psychiatric/Behavioral: Denies suicidal ideation, mood changes, confusion, nervousness, sleep disturbance and agitation   Physical Exam: Blood pressure 106/55, pulse 74, temperature 98 F (36.7 C), temperature source Oral, resp. rate 17, SpO2 99.00%. General: Patient is lying in bed, in no chest, alert and oriented x3 HEENT: Normocephalic, atraumatic, pupils are equal round react to light, mucous membranes are moist Neck: Supple Chest: Clear to auscultation bilaterally Cardiac: S1, S2, regular rate and rhythm Abdomen: Soft, nontender, nondistended, bowel sounds are active Extremities: No edema, cyanosis, clubbing Neurologic: 5 out of 5 strength bilaterally in the upper and lower extremities, cranial nerves II through XII are grossly intact Skin: Warm, intact  Labs on Admission:  Results for orders placed during the hospital encounter of 03/19/11 (from the past 48 hour(s))  CBC     Status: Normal   Collection Time   03/19/11 12:55 PM      Component Value Range Comment   WBC 6.4  4.0 - 10.5 (K/uL)    RBC 4.45  4.22 - 5.81 (MIL/uL)    Hemoglobin 14.4  13.0 - 17.0 (g/dL)    HCT 11.9  14.7 - 82.9 (%)    MCV 94.8  78.0 - 100.0 (fL)    MCH 32.4  26.0 - 34.0 (pg)    MCHC 34.1  30.0 - 36.0 (g/dL)    RDW 56.2  13.0 - 86.5 (%)    Platelets 160  150 - 400 (K/uL)   BASIC METABOLIC PANEL     Status: Abnormal   Collection Time   03/19/11 12:55 PM      Component Value Range Comment   Sodium 143  135 - 145 (mEq/L)    Potassium 3.5  3.5 - 5.1 (mEq/L)    Chloride 106  96 - 112 (mEq/L)    CO2 32  19 - 32 (mEq/L)    Glucose, Bld 103 (*) 70 - 99 (mg/dL)    BUN 23  6 - 23 (mg/dL)    Creatinine, Ser 7.84  0.50 - 1.35 (mg/dL)     Calcium 9.3  8.4 - 10.5 (mg/dL)    GFR calc non Af Amer 55 (*) >90 (mL/min)    GFR calc Af Amer 63 (*) >90 (mL/min)   POCT I-STAT TROPONIN I     Status: Normal   Collection Time   03/19/11  1:11 PM      Component Value Range Comment   Troponin i, poc 0.00  0.00 - 0.08 (ng/mL)    Comment 3              Radiological  Exams on Admission: Dg Chest 2 View  03/19/2011  *RADIOLOGY REPORT*  Clinical Data: Syncope and shortness of breath.  CHEST - 2 VIEW  Comparison: 02/25/2011  Findings: Stable chronic lung disease.  Stable appearance of occluder device within the heart.  No edema, infiltrate or pleural fluid identified.  Heart size is within normal limits.  IMPRESSION: Stable chronic lung disease.  No acute findings.  Original Report Authenticated By: Reola Calkins, M.D.   Ct Head Wo Contrast  03/19/2011  *RADIOLOGY REPORT*  Clinical Data: Near-syncope  CT HEAD WITHOUT CONTRAST  Technique:  Contiguous axial images were obtained from the base of the skull through the vertex without contrast.  Comparison: 02/23/2011  Findings: No skull fracture is noted. Again noted nodular lesion in the left maxillary sinus measures 1.4 cm.  This may represent a polyp or mucous retention cyst.  Stable from prior exam.  No intracranial hemorrhage, mass effect or midline shift.  Mastoid air cells are unremarkable.  Stable cerebral atrophy and chronic white matter disease.  No definite acute infarction.  No mass lesion is noted on this unenhanced scan.  IMPRESSION:  1.  Stable atrophy and chronic white matter disease.  No definite acute cortical infarction.  Stable nodular lesion in the left maxillary sinus.  Original Report Authenticated By: Natasha Mead, M.D.    Assessment/Plan Principal Problem:  *Syncope and collapse Active Problems:  Hypertension  Mild dehydration  Plan:  Patient will be admitted under observation status.  His syncope is likely vasovagal in origin.  We will check orthostatics.  He will be  monitored on telemetry to rule out any arrhythmias.  We will cycle his cardiac markers, check TSH and check a 2D echo.  He will receive gentle hydration.  We will check urinalysis. He has a normal neurologic exam and his CT head is negative, so I do not feel that an MRI is needed at this time.  If work up is negative, and patient is asymptomatic then can likely d/c home.  Time Spent on Admission:  Sativa Gelles [facility] 03/19/2011, 5:18 PM

## 2011-03-20 DIAGNOSIS — R55 Syncope and collapse: Secondary | ICD-10-CM

## 2011-03-20 LAB — CARDIAC PANEL(CRET KIN+CKTOT+MB+TROPI)
CK, MB: 2.6 ng/mL (ref 0.3–4.0)
Relative Index: INVALID (ref 0.0–2.5)
Relative Index: 2.6 — ABNORMAL HIGH (ref 0.0–2.5)
Total CK: 101 U/L (ref 7–232)

## 2011-03-20 LAB — CREATININE, SERUM
GFR calc Af Amer: 59 mL/min — ABNORMAL LOW (ref >90)
GFR calc non Af Amer: 51 mL/min — ABNORMAL LOW (ref >90)

## 2011-03-20 LAB — CBC
HCT: 40.7 % (ref 39.0–52.0)
Hemoglobin: 13.7 g/dL (ref 13.0–17.0)
MCHC: 33.7 g/dL (ref 30.0–36.0)
MCV: 95.1 fL (ref 78.0–100.0)
RDW: 13.7 % (ref 11.5–15.5)

## 2011-03-20 MED ORDER — CHLORTHALIDONE 25 MG PO TABS
25.0000 mg | ORAL_TABLET | Freq: Every day | ORAL | Status: DC
  Administered 2011-03-20 – 2011-03-21 (×2): 25 mg via ORAL
  Filled 2011-03-20 (×2): qty 1

## 2011-03-20 MED ORDER — SIMVASTATIN 20 MG PO TABS
20.0000 mg | ORAL_TABLET | Freq: Every day | ORAL | Status: DC
  Administered 2011-03-20: 20 mg via ORAL
  Filled 2011-03-20 (×3): qty 1

## 2011-03-20 MED ORDER — ONDANSETRON HCL 4 MG/2ML IJ SOLN: 4.0000 mg | Freq: Four times a day (QID) | INTRAMUSCULAR | Status: DC | PRN

## 2011-03-20 MED ORDER — OMEGA-3-ACID ETHYL ESTERS 1 G PO CAPS
1.0000 g | ORAL_CAPSULE | Freq: Every day | ORAL | Status: DC
  Administered 2011-03-20 – 2011-03-21 (×2): 1 g via ORAL
  Filled 2011-03-20 (×3): qty 1

## 2011-03-20 MED ORDER — ACETAMINOPHEN 650 MG RE SUPP: 650.0000 mg | Freq: Four times a day (QID) | RECTAL | Status: DC | PRN

## 2011-03-20 MED ORDER — LEVOTHYROXINE SODIUM 100 MCG PO TABS
100.0000 ug | ORAL_TABLET | Freq: Every day | ORAL | Status: DC
  Administered 2011-03-20 – 2011-03-21 (×2): 100 ug via ORAL
  Filled 2011-03-20 (×3): qty 1

## 2011-03-20 MED ORDER — ENOXAPARIN SODIUM 40 MG/0.4ML ~~LOC~~ SOLN
40.0000 mg | SUBCUTANEOUS | Status: DC
  Administered 2011-03-20 – 2011-03-21 (×2): 40 mg via SUBCUTANEOUS
  Filled 2011-03-20 (×3): qty 0.4

## 2011-03-20 MED ORDER — OLMESARTAN 10 MG HALF TABLET
10.0000 mg | ORAL_TABLET | Freq: Every day | ORAL | Status: DC
  Administered 2011-03-20 – 2011-03-21 (×2): 10 mg via ORAL
  Filled 2011-03-20 (×3): qty 1

## 2011-03-20 MED ORDER — ONDANSETRON HCL 4 MG PO TABS: 4.0000 mg | ORAL_TABLET | Freq: Four times a day (QID) | ORAL | Status: DC | PRN

## 2011-03-20 MED ORDER — ACETAMINOPHEN 325 MG PO TABS
650.0000 mg | ORAL_TABLET | Freq: Four times a day (QID) | ORAL | Status: DC | PRN
Start: 2011-03-20 — End: 2011-03-21

## 2011-03-20 MED ORDER — ASPIRIN 325 MG PO TABS
325.0000 mg | ORAL_TABLET | Freq: Every day | ORAL | Status: DC
  Administered 2011-03-20 – 2011-03-21 (×2): 325 mg via ORAL
  Filled 2011-03-20 (×3): qty 1

## 2011-03-20 MED ORDER — ADULT MULTIVITAMIN W/MINERALS CH
1.0000 | ORAL_TABLET | Freq: Every day | ORAL | Status: DC
  Administered 2011-03-20 – 2011-03-21 (×2): 1 via ORAL
  Filled 2011-03-20 (×3): qty 1

## 2011-03-20 MED ORDER — SODIUM CHLORIDE 0.9 % IV SOLN
INTRAVENOUS | Status: DC
  Administered 2011-03-20 – 2011-03-21 (×3): via INTRAVENOUS

## 2011-03-20 MED ORDER — LABETALOL HCL 100 MG PO TABS
100.0000 mg | ORAL_TABLET | Freq: Two times a day (BID) | ORAL | Status: DC
  Administered 2011-03-20 – 2011-03-21 (×4): 100 mg via ORAL
  Filled 2011-03-20 (×7): qty 1

## 2011-03-20 NOTE — Progress Notes (Signed)
Subjective: Pt denies any Park/o- no cp, no dizziness.wife at bedside. Objective: Vital signs in last 24 hours: Temp:  [97.4 F (36.3 Park)-98.3 F (36.8 Park)] 98.3 F (36.8 Park) (12/31 2152) Pulse Rate:  [66-84] 66  (12/31 2152) Resp:  [18-20] 20  (12/31 2152) BP: (103-129)/(72-89) 117/75 mmHg (12/31 2152) SpO2:  [95 %-99 %] 95 % (12/31 2152) Weight:  [78.3 kg (172 lb 9.9 oz)] 172 lb 9.9 oz (78.3 kg) (12/31 0330) Last BM Date: 03/19/11 Intake/Output from previous day: 12/30 0701 - 12/31 0700 In: 337.5 [P.O.:120; I.V.:217.5] Out: -  Intake/Output this shift: Total I/O In: -  Out: 175 [Urine:175]    General Appearance:    Alert, cooperative, no distress, appears stated age  Lungs:     Clear to auscultation bilaterally, respirations unlabored   Heart:    Regular rate and rhythm, S1 and S2 normal, no murmur, rub   or gallop  Abdomen:     Soft, non-tender, bowel sounds active all four quadrants,    no masses, no organomegaly  Extremities:   Extremities normal, atraumatic, no cyanosis or edema  Neurologic:   CNII-XII intact, normal strength, sensory grossly intact, nonfocal      Weight change:   Intake/Output Summary (Last 24 hours) at 03/20/11 2239 Last data filed at 03/20/11 2100  Gross per 24 hour  Intake 1417.5 ml  Output   1625 ml  Net -207.5 ml    Lab Results:   Basename 03/20/11 0512 03/19/11 1255  NA -- 143  K -- 3.5  CL -- 106  CO2 -- 32  GLUCOSE -- 103*  BUN -- 23  CREATININE 1.36* 1.29  CALCIUM -- 9.3    Basename 03/20/11 0512 03/19/11 1255  WBC 7.2 6.4  HGB 13.7 14.4  HCT 40.7 42.2  PLT 150 160  MCV 95.1 94.8   PT/INR No results found for this basename: LABPROT:2,INR:2 in the last 72 hours ABG No results found for this basename: PHART:2,PCO2:2,PO2:2,HCO3:2 in the last 72 hours  Micro Results: No results found for this or any previous visit (from the past 240 hour(s)). Studies/Results: Dg Chest 2 View  03/19/2011  *RADIOLOGY REPORT*  Clinical  Data: Syncope and shortness of breath.  CHEST - 2 VIEW  Comparison: 02/25/2011  Findings: Stable chronic lung disease.  Stable appearance of occluder device within the heart.  No edema, infiltrate or pleural fluid identified.  Heart size is within normal limits.  IMPRESSION: Stable chronic lung disease.  No acute findings.  Original Report Authenticated By: Reola Calkins, M.D.   Ct Head Wo Contrast  03/19/2011  *RADIOLOGY REPORT*  Clinical Data: Near-syncope  CT HEAD WITHOUT CONTRAST  Technique:  Contiguous axial images were obtained from the base of the skull through the vertex without contrast.  Comparison: 02/23/2011  Findings: No skull fracture is noted. Again noted nodular lesion in the left maxillary sinus measures 1.4 cm.  This may represent a polyp or mucous retention cyst.  Stable from prior exam.  No intracranial hemorrhage, mass effect or midline shift.  Mastoid air cells are unremarkable.  Stable cerebral atrophy and chronic white matter disease.  No definite acute infarction.  No mass lesion is noted on this unenhanced scan.  IMPRESSION:  1.  Stable atrophy and chronic white matter disease.  No definite acute cortical infarction.  Stable nodular lesion in the left maxillary sinus.  Original Report Authenticated By: Natasha Mead, M.D.   Medications: Scheduled Meds:   . aspirin  325 mg Oral  Daily  . chlorthalidone  25 mg Oral Daily  . enoxaparin  40 mg Subcutaneous Q24H  . labetalol  100 mg Oral BID  . levothyroxine  100 mcg Oral Daily  . mulitivitamin with minerals  1 tablet Oral Daily  . olmesartan  10 mg Oral Daily  . omega-3 acid ethyl esters  1 g Oral Daily  . simvastatin  20 mg Oral q1800   Continuous Infusions:   . sodium chloride 75 mL/hr at 03/20/11 1747   PRN Meds:.acetaminophen, acetaminophen, ondansetron (ZOFRAN) IV, ondansetron Assessment/Plan: Patient Active Hospital Problem List: Syncope and collapse (03/19/2011)   Will await 2-Decho, carotid doppler with ECA  stenosis, but no ICA stenosis  -not orthostatic via vitals but not likely that they were done prior to ivf in the ED, will d/Park chlorthalidone  Hypertension () Continue outpt meds except for diuretic as above   Mild dehydration (03/19/2011) Will dc chorthalidone,hydrate and follow     LOS: 1 day   Benjamin Park 03/20/2011, 10:39 PM

## 2011-03-20 NOTE — Progress Notes (Addendum)
*  PRELIMINARY RESULTS* Bilateral carotid dopplers performed. Preliminary findings showed no ICA stenosis. There is ECA stenosis.  Antegrade vertebral artery flow.  Benjamin Park 03/20/2011, 1:30 PM

## 2011-03-20 NOTE — Progress Notes (Signed)
Echocardiogram 2D Echocardiogram has been performed.  [name] 03/20/2011, 4:28 PM

## 2011-03-20 NOTE — Progress Notes (Signed)
UR completed 

## 2011-03-21 LAB — CARDIAC PANEL(CRET KIN+CKTOT+MB+TROPI)
Relative Index: INVALID (ref 0.0–2.5)
Total CK: 97 U/L (ref 7–232)

## 2011-03-21 LAB — BASIC METABOLIC PANEL
BUN: 18 mg/dL (ref 6–23)
CO2: 28 mEq/L (ref 19–32)
Calcium: 8.4 mg/dL (ref 8.4–10.5)
Chloride: 107 mEq/L (ref 96–112)
Glucose, Bld: 91 mg/dL (ref 70–99)
Glucose, Bld: 91 mg/dL (ref 70–99)
Potassium: 3.4 mEq/L — ABNORMAL LOW (ref 3.5–5.1)
Sodium: 140 mEq/L (ref 135–145)

## 2011-03-21 MED ORDER — SODIUM CHLORIDE 0.9 % IV BOLUS (SEPSIS)
500.0000 mL | Freq: Once | INTRAVENOUS | Status: AC
  Administered 2011-03-21: 500 mL via INTRAVENOUS

## 2011-03-21 MED ORDER — CLONIDINE HCL 0.2 MG PO TABS: 0.1000 mg | ORAL_TABLET | Freq: Two times a day (BID) | ORAL | Status: DC | PRN

## 2011-03-21 MED ORDER — POTASSIUM CHLORIDE CRYS ER 20 MEQ PO TBCR
40.0000 meq | EXTENDED_RELEASE_TABLET | Freq: Two times a day (BID) | ORAL | Status: DC
  Administered 2011-03-21: 40 meq via ORAL
  Filled 2011-03-21 (×4): qty 2

## 2011-03-21 NOTE — Progress Notes (Signed)
Name: TORREN MAFFEO MRN: [id number] DOB: Dec 22, 1940 71 y.o.  Date of Admission: 03/19/2011 12:18 PM Date of Discharge: 03/21/2011 Attending Physician: Erick Blinks  Discharge Diagnosis: Principal Problem:  *Syncope and collapse- likely secondary to orthostatic hypotension Active Problems:  Hypertension  Mild dehydration  Hypokalemia History of cerebrovascular History of coronary artery disease   Discharge Medications: Current Discharge Medication List    CONTINUE these medications which have CHANGED   Details  cloNIDine (CATAPRES) 0.2 MG tablet Take 0.5 tablets (0.1 mg total) by mouth 2 (two) times daily as needed. For blood pressure greater than 160/100 Qty: 30 tablet, Refills: 0      CONTINUE these medications which have NOT CHANGED   Details  aspirin 325 MG tablet Take 325 mg by mouth daily.      fish oil-omega-3 fatty acids 1000 MG capsule Take 1 g by mouth daily.     food thickener (THICK IT) POWD Take 227 g by mouth as needed (honey thick liquids). Qty: 30 Can, Refills: 0    GLUCOSAMINE PO Take 1 tablet by mouth daily.     labetalol (NORMODYNE) 100 MG tablet Take 100 mg by mouth 2 (two) times daily.      levothyroxine (SYNTHROID, LEVOTHROID) 100 MCG tablet Take 100 mcg by mouth daily.      lovastatin (MEVACOR) 40 MG tablet Take 40 mg by mouth at bedtime.      Multiple Vitamin (MULITIVITAMIN WITH MINERALS) TABS Take 1 tablet by mouth daily.      olmesartan (BENICAR) 20 MG tablet Take 10 mg by mouth daily.       STOP taking these medications     chlorthalidone (HYGROTON) 25 MG tablet         Disposition and follow-up:   Mr.Myan L Satterwhite was discharged from [facility] in improved/stable   condition.    Follow-up Appointments: Discharge Orders    Future Appointments: Provider: Department: Dept Phone: Center:   04/10/2011 9:30 AM Peter Swaziland, MD [facility] Cardiology [phone] None     Future Orders Please Complete By Expires   Diet - low sodium heart healthy      Increase activity slowly         Consultations:    Procedures Performed:  Dg Chest 2 View  03/19/2011  *RADIOLOGY REPORT*  Clinical Data: Syncope and shortness of breath.  CHEST - 2 VIEW  Comparison: 02/25/2011  Findings: Stable chronic lung disease.  Stable appearance of occluder device within the heart.  No edema, infiltrate or pleural fluid identified.  Heart size is within normal limits.  IMPRESSION: Stable chronic lung disease.  No acute findings.  Original Report Authenticated By: Reola Calkins, M.D.   Ct Head Wo Contrast  03/19/2011  *RADIOLOGY REPORT*  Clinical Data: Near-syncope  CT HEAD WITHOUT CONTRAST  Technique:  Contiguous axial images were obtained from the base of the skull through the vertex without contrast.  Comparison: 02/23/2011  Findings: No skull fracture is noted. Again noted nodular lesion in the left maxillary sinus measures 1.4 cm.  This may represent a polyp or mucous retention cyst.  Stable from prior exam.  No intracranial hemorrhage, mass effect or midline shift.  Mastoid air cells are unremarkable.  Stable cerebral atrophy and chronic white matter disease.  No definite acute infarction.  No mass lesion is noted on this unenhanced scan.  IMPRESSION:  1.  Stable atrophy and chronic white matter disease.  No definite acute cortical infarction.  Stable nodular lesion  in the left maxillary sinus.  Original Report Authenticated By: Natasha Mead, M.D.   Ct Head Wo Contrast  02/23/2011  *RADIOLOGY REPORT*  Clinical Data: Headache with altered mental status and vomiting. Evaluate for stroke or hemorrhage.  CT HEAD WITHOUT CONTRAST  Technique:  Contiguous axial images were obtained from the base of the skull through the vertex without contrast.  Comparison: MR of the brain 10/04/2010.  Head CT 10/03/2010.  Findings: There is stable generalized prominence of the ventricles and subarachnoid spaces consistent with atrophy.  Chronic small  vessel ischemic changes in the periventricular white matter are unchanged.  There is no evidence of acute intracranial hemorrhage, mass lesion, brain edema or extra-axial fluid collection.  There is no CT evidence of acute stroke.  Within the left maxillary sinus, there is a 1.4 cm polypoid lesion on image 1.  This was present on the prior MRI, although was partly obscured by the adjacent mucosal thickening.  The visualized paranasal sinuses are otherwise clear.  Calvarium is intact.  IMPRESSION:  1.  No CT evidence of acute stroke or hemorrhage. 2.  Stable chronic atrophy and small vessel ischemic changes. 3.  Polypoid left maxillary sinus lesion suspicious for a polyp.  Critical Value/emergent results were called by telephone at the time of interpretation on 02/23/2011  at 1916 hours  to  Dr. Oletta Lamas, who verbally acknowledged these results.  Original Report Authenticated By: Gerrianne Scale, M.D.   Mr [name] Contrast  02/24/2011  *RADIOLOGY REPORT*  Clinical Data: Hypertensive encephalopathy versus TIA. Hypertension.  Confusion  MRI HEAD WITHOUT AND WITH CONTRAST  Technique:  Multiplanar, multiecho pulse sequences of the brain and surrounding structures were obtained according to standard protocol without and with intravenous contrast  Contrast: 15mL MULTIHANCE GADOBENATE DIMEGLUMINE 529 MG/ML IV SOLN  Comparison:  CT 02/23/2011, MRI 10/04/2010.  Findings: Advanced atrophy.  Prominent subarachnoid space and prominent ventricles diffusely and unchanged from prior studies.  No evidence of hypertensive encephalopathy.  No edema in the posterior circulation.  Negative for acute infarct.  Mild chronic microvascular ischemia in the white matter.  Small micro hemorrhage left parietal lobe, unchanged from 10/04/2010.  No fluid collection or acute hemorrhage.  Chronic sinusitis with mucosal thickening in the maxillary ethmoid sinuses.  Soft tissue lesion within the left maxillary sinus attached to the medial wall.   This has enlarged since the prior MRI in 2008 and may represent a polyp or neoplasm.  This lesion measures 16 mm.  IMPRESSION: Extensive atrophy.  Mild chronic microvascular ischemia.  No acute infarct.  16 mm soft tissue lesion in the left maxillary sinus.  This may represent a polyp or neoplasm.  Original Report Authenticated By: Camelia Phenes, M.D.   Dg Chest Port 1 View  02/25/2011  *RADIOLOGY REPORT*  Clinical Data: Coronary artery disease.  Previous closure of patent foraminal valley.  PORTABLE CHEST - 1 VIEW  Comparison: 02/24/2011  Findings: Both lungs remain clear.  Heart size is stable.  IMPRESSION: No acute findings.  Original Report Authenticated By: Danae Orleans, M.D.   Dg Chest Port 1 View  02/24/2011  *RADIOLOGY REPORT*  Clinical Data: Device position  PORTABLE CHEST - 1 VIEW  Comparison: 04/11/2010  Findings: Thorax is rotated to the left limiting evaluation.  The atrial closure device is grossly unchanged in position.  The right lung is clear.  The left lung is obscured by the cardiac silhouette.  No obvious pneumothorax.  IMPRESSION: Limited exam.  The atrial  closure device is grossly unchanged in position.  Repeat chest x-ray with better positioning is recommended.  Original Report Authenticated By: Donavan Burnet, M.D.   Dg Swallowing Func-no Report  02/25/2011  CLINICAL DATA: Dysphagia   FLUOROSCOPY FOR SWALLOWING FUNCTION STUDY:  Fluoroscopy was provided for swallowing function study, which was  administered by a speech pathologist.  Final results and recommendations  from this study are contained within the speech pathology report.     2-D echo Study Conclusions  Left ventricle: The cavity size was normal. Systolic function was normal. The estimated ejection fraction was in the range of 60% to 65%. Wall motion was normal; there were no regional wall motion abnormalities. Transthoracic echocardiography. M-mode, complete 2D, spectral Doppler, and color Doppler. Height:  Height: 182.9cm. Height: 72in. Weight: Weight: 78kg. Weight: 171.6lb. Body mass index: BMI: 23.3kg/m^2. Body surface area: BSA: 49m^2. Blood pressure: 129/89. Patient status: Inpatient. Location: Bedside.  Carotid Doppler US Bilateral carotid dopplers performed. Preliminary findings showed no ICA stenosis. There is ECA stenosis. Antegrade vertebral artery flow.  Brief history  This is a 71 year old gentleman with history of hypertension, TIAs, subarachnoid hemorrhage. The patient was at church today when he had an episode of syncope. He reports feeling "something did not feel right" prior to passing out. He is unable to me exactly what he felt. Denies any shortness of breath, chest pain, dizziness prior to passing out. He had passed out for unknown period of time. Does not appear to have any head trauma. Post syncope, he did not have any confusion, no tremors or jerking was noted during the event. He was brought to the ER for evaluation where workup has thus far been unremarkable. He denies any symptoms at this time. He has been referred for observation for further workup.    physical exam General Appearance:  Alert, cooperative, no distress, appears stated age   Lungs:  Clear to auscultation bilaterally, respirations unlabored   Heart:  Regular rate and rhythm, S1 and S2 normal, no murmur, rub  or gallop   Abdomen:  Soft, non-tender, bowel sounds active all four quadrants,  no masses, no organomegaly   Extremities:  Extremities normal, atraumatic, no cyanosis or edema   Neurologic:  CNII-XII intact, normal strength, sensory grossly intact, nonfocal       Hospital Course by problem list: Principal Problem:  *Syncope and collapse-likely secondary to orthostatic hypotension -Upon admission the patient had cardiac enzymes cycled and they came back negative. A CT scan of his head came back negative for acute intracranial findings. A 2-D echocardiogram was done and showed an ejection fraction  of 60-65%, and carotid Doppler as came back with no ICA stenosis, , right ECA stenosis noted-discussed with neural but they indicated that this was not significant and would not be the cause of his syncope. Patient had orthostatic vitals repeated  following his admissions and and he was orthostatic, he was hydrated with IV fluids and his are chlorthalidone has been discontinued. The patient admitted that on the morning that he had his syncopal episode he had not eaten a good breakfast and had taken his blood pressure medications at that day prior to going to church. Impression was that the syncope was secondary to orthostatic hypotension, following adequate hydration with IV fluids his orthostatics were rechecked today and he's no longer orthostatic, he's also asymptomatic. Active Problems:  Hypertension -His blood pressures where our monitored and chlorthalidone was discontinued in the hospital of, also his clonidine dose has been decreased  prior to discharge  to prevent hypotensive episodes as his blood measures were controlled and the 120s of these these meds. He is to followup with his primary care physician and his cardiologist upon discharge for further monitoring and adjustment of his medications as appropriate for optimal control.  Mild dehydration he was hydrated during this hospital stay and his diuretic discontinued as above.  Discharge Vitals:  BP 127/83  Pulse 67  Temp(Src) 97.3 F (36.3 C) (Oral)  Resp 18  Ht 6' (1.829 m)  Wt 78.3 kg (172 lb 9.9 oz)  BMI 23.41 kg/m2  SpO2 91%  Discharge Labs:  Results for orders placed during the hospital encounter of 03/19/11 (from the past 24 hour(s))  CARDIAC PANEL(CRET KIN+CKTOT+MB+TROPI)     Status: Abnormal   Collection Time   03/20/11  3:56 PM      Component Value Range   Total CK 101  7 - 232 (U/L)   CK, MB 2.6  0.3 - 4.0 (ng/mL)   Troponin I <0.30  <0.30 (ng/mL)   Relative Index 2.6 (*) 0.0 - 2.5   CARDIAC PANEL(CRET  KIN+CKTOT+MB+TROPI)     Status: Normal   Collection Time   03/21/11 12:40 AM      Component Value Range   Total CK 97  7 - 232 (U/L)   CK, MB 2.3  0.3 - 4.0 (ng/mL)   Troponin I <0.30  <0.30 (ng/mL)   Relative Index RELATIVE INDEX IS INVALID  0.0 - 2.5   BASIC METABOLIC PANEL     Status: Abnormal   Collection Time   03/21/11 12:40 AM      Component Value Range   Sodium 140  135 - 145 (mEq/L)   Potassium 3.4 (*) 3.5 - 5.1 (mEq/L)   Chloride 106  96 - 112 (mEq/L)   CO2 28  19 - 32 (mEq/L)   Glucose, Bld 91  70 - 99 (mg/dL)   BUN 22  6 - 23 (mg/dL)   Creatinine, Ser 1.61  0.50 - 1.35 (mg/dL)   Calcium 8.4  8.4 - 09.6 (mg/dL)   GFR calc non Af Amer 54 (*) >90 (mL/min)   GFR calc Af Amer 63 (*) >90 (mL/min)  BASIC METABOLIC PANEL     Status: Abnormal   Collection Time   03/21/11  8:05 AM      Component Value Range   Sodium 142  135 - 145 (mEq/L)   Potassium 3.4 (*) 3.5 - 5.1 (mEq/L)   Chloride 107  96 - 112 (mEq/L)   CO2 28  19 - 32 (mEq/L)   Glucose, Bld 91  70 - 99 (mg/dL)   BUN 18  6 - 23 (mg/dL)   Creatinine, Ser 0.45  0.50 - 1.35 (mg/dL)   Calcium 8.6  8.4 - 40.9 (mg/dL)   GFR calc non Af Amer 55 (*) >90 (mL/min)   GFR calc Af Amer 64 (*) >90 (mL/min)    SignedDonnalee Curry C 03/21/2011, 3:50 PM

## 2011-03-28 ENCOUNTER — Ambulatory Visit: Payer: Medicare Other | Admitting: Cardiology

## 2011-04-10 ENCOUNTER — Ambulatory Visit (INDEPENDENT_AMBULATORY_CARE_PROVIDER_SITE_OTHER): Payer: Medicare Other | Admitting: Cardiology

## 2011-04-10 ENCOUNTER — Encounter: Payer: Self-pay | Admitting: Cardiology

## 2011-04-10 VITALS — BP 136/86 | HR 80 | Ht 72.0 in | Wt 180.4 lb

## 2011-04-10 DIAGNOSIS — I2510 Atherosclerotic heart disease of native coronary artery without angina pectoris: Secondary | ICD-10-CM

## 2011-04-10 DIAGNOSIS — I10 Essential (primary) hypertension: Secondary | ICD-10-CM

## 2011-04-10 DIAGNOSIS — I674 Hypertensive encephalopathy: Secondary | ICD-10-CM

## 2011-04-10 NOTE — Progress Notes (Signed)
Benjamin Park Date of Birth: 11-15-1940   History of Present Illness: Benjamin Park is seen today for followup. He was admitted the first week in December with hypertensive encephalopathy associated with significant dysarthria and confusion. His blood pressure was markedly elevated. CT and MRI of the head were unremarkable. His blood pressure was controlled and he was discharged to home. 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 He had no warning. Again his evaluation was unremarkable except for some orthostasis. His chlorthalidone was discontinued but subsequently his blood pressure increased again and it was resumed. He has had no further dizziness or syncope. He reports that 90% of the time his blood pressure is under excellent control but he still has some labile readings. He now has clonidine to take on a when necessary basis 0.1 mg.   Current Outpatient Prescriptions on File Prior to Visit  Medication Sig Dispense Refill  . aspirin 325 MG tablet Take 325 mg by mouth daily.        . cloNIDine (CATAPRES) 0.2 MG tablet Take 0.5 tablets (0.1 mg total) by mouth 2 (two) times daily as needed. For blood pressure greater than 160/100  30 tablet  0  . fish oil-omega-3 fatty acids 1000 MG capsule Take 1 g by mouth daily.       . food thickener (THICK IT) POWD Take 227 g by mouth as needed (honey thick liquids).  30 Can  0  . GLUCOSAMINE PO Take 1 tablet by mouth daily.       Marland Kitchen labetalol (NORMODYNE) 100 MG tablet Take 100 mg by mouth 2 (two) times daily.        Marland Kitchen levothyroxine (SYNTHROID, LEVOTHROID) 100 MCG tablet Take 100 mcg by mouth daily.        Marland Kitchen lovastatin (MEVACOR) 40 MG tablet Take 40 mg by mouth at bedtime.        . Multiple Vitamin (MULITIVITAMIN WITH MINERALS) TABS Take 1 tablet by mouth daily.        Marland Kitchen olmesartan (BENICAR) 20 MG tablet Take 10 mg by mouth daily.         No Known Allergies  Past Medical  History  Diagnosis Date  . Hypertension   . Hx of transient ischemic attack (TIA)   . Hyperlipidemia   . Coronary artery disease   . Throat cancer     radiation therapy  . Hypertensive encephalopathy   . ED (erectile dysfunction)   . Personal history of kidney stones   . Subarachnoid hemorrhage   . Sleep apnea     on CPAP  . Stroke   . Myocardial infarction     12/1991  . Headache   . Dysphagia   . Hypokalemia     Past Surgical History  Procedure Date  . Angioplasty 1993    LAD  . Cardiac catheterization   . Coronary angioplasty   . Basal cell cancer     head  . Hernia repair   . Appendectomy   . Patent foramen ovale closure 2008  . Eye surgery     History  Smoking status  . Former Smoker  . Types: Cigarettes  . Quit date: 09/21/1990  Smokeless tobacco  . Not on file    History  Alcohol Use No    Family History  Problem Relation Age of Onset  . Heart failure Father   . Heart attack Brother   .  Anemia Brother 63    mi    Review of Systems: The review of systems is positive for problems with his balance.  All other systems were reviewed and are negative.  Physical Exam: BP 136/86  Pulse 80  Ht 6' (1.829 m)  Wt 180 lb 6.4 oz (81.829 kg)  BMI 24.47 kg/m2 He is a pleasant white male in no acute distress. He is normocephalic, atraumatic. Pupils are equal round and reactive. Oropharynx is clear. Sclera clear. Neck is supple without JVD, adenopathy, thyromegaly, or bruits. Lungs are clear. Cardiac damage is a regular rate and rhythm without gallop, murmur, or click. Abdomen is soft and nontender without masses or bruits. He has no edema. Pedal pulses are good. He is alert oriented x3. He still has a mild dysarthria. His exam is nonfocal. LABORATORY DATA:   Assessment / Plan:

## 2011-04-10 NOTE — Assessment & Plan Note (Signed)
He remains asymptomatic from a coronary standpoint. His last stress test was in 2007. Given his recent troubles recommend continued risk factor modification.

## 2011-04-10 NOTE — Patient Instructions (Signed)
Continue your current therapy  I will see you again in 6 months.   

## 2011-04-10 NOTE — Assessment & Plan Note (Signed)
In general his blood pressure is under fairly good control with still occasional elevations. He does get quite symptomatic with blood pressure elevation with worsening hypertensive encephalopathy. I agree with plans to use clonidine on a when necessary basis. I would continue his chlorthalidone for now. If he were to have recurrent problems with orthostatic hypotension or syncope we may need to reduce the dose. I have encouraged him to stay well hydrated. I'll followup again in 6 months.

## 2011-05-15 ENCOUNTER — Other Ambulatory Visit: Payer: Self-pay | Admitting: Gastroenterology

## 2011-09-12 ENCOUNTER — Encounter: Payer: Self-pay | Admitting: Cardiology

## 2011-12-25 ENCOUNTER — Ambulatory Visit (INDEPENDENT_AMBULATORY_CARE_PROVIDER_SITE_OTHER): Payer: Medicare Other | Admitting: Cardiology

## 2011-12-25 ENCOUNTER — Encounter: Payer: Self-pay | Admitting: Cardiology

## 2011-12-25 VITALS — BP 130/70 | HR 58 | Ht 72.0 in | Wt 178.6 lb

## 2011-12-25 DIAGNOSIS — I674 Hypertensive encephalopathy: Secondary | ICD-10-CM

## 2011-12-25 DIAGNOSIS — E785 Hyperlipidemia, unspecified: Secondary | ICD-10-CM

## 2011-12-25 DIAGNOSIS — I10 Essential (primary) hypertension: Secondary | ICD-10-CM

## 2011-12-25 DIAGNOSIS — I2510 Atherosclerotic heart disease of native coronary artery without angina pectoris: Secondary | ICD-10-CM

## 2011-12-25 NOTE — Patient Instructions (Addendum)
Continue your current therapy.  Avoid salt intake.  I will see you again in 6 months

## 2011-12-25 NOTE — Progress Notes (Signed)
Benjamin Park Date of Birth: [birth year]   History of Present Illness: Benjamin Park is seen today for followup. He he has a history of labile hypertension and hypertensive encephalopathy. He reports that he stop taking his Benicar on his own because his blood pressure was running too low. Typically now his blood pressure will run between 120 and 130 systolic. Rarely he will have elevations up to 170/103. When this happens he will take a half a dose of clonidine but has only had to do this 2 or 3 times in the last 6 months. He does complain of some swelling in his ankles.  Current Outpatient Prescriptions on File Prior to Visit  Medication Sig Dispense Refill  . aspirin 325 MG tablet Take 325 mg by mouth daily.        . cloNIDine (CATAPRES) 0.2 MG tablet Take 0.5 tablets (0.1 mg total) by mouth 2 (two) times daily as needed. For blood pressure greater than 160/100  30 tablet  0  . fish oil-omega-3 fatty acids 1000 MG capsule Take 1 g by mouth daily.       . food thickener (THICK IT) POWD Take 227 g by mouth as needed (honey thick liquids).  30 Can  0  . GLUCOSAMINE PO Take 1 tablet by mouth daily.       Marland Kitchen labetalol (NORMODYNE) 100 MG tablet Take 100 mg by mouth 2 (two) times daily.        Marland Kitchen levothyroxine (SYNTHROID, LEVOTHROID) 100 MCG tablet Take 100 mcg by mouth daily.        Marland Kitchen lovastatin (MEVACOR) 40 MG tablet Take 40 mg by mouth at bedtime.        . Multiple Vitamin (MULITIVITAMIN WITH MINERALS) TABS Take 1 tablet by mouth daily.          No Known Allergies  Past Medical History  Diagnosis Date  . Hypertension   . Hx of transient ischemic attack (TIA)   . Hyperlipidemia   . Coronary artery disease   . Throat cancer     radiation therapy  . Hypertensive encephalopathy   . ED (erectile dysfunction)   . Personal history of kidney stones   . Subarachnoid hemorrhage   . Sleep apnea     on CPAP  . Stroke   . Myocardial infarction     12/1991  . Headache   . Dysphagia   .  Hypokalemia   . AKI (acute kidney injury) 02/23/2011    Past Surgical History  Procedure Date  . Angioplasty 1993    LAD  . Cardiac catheterization   . Coronary angioplasty   . Basal cell cancer     head  . Hernia repair   . Appendectomy   . Patent foramen ovale closure 2008  . Eye surgery     History  Smoking status  . Former Smoker  . Types: Cigarettes  . Quit date: 09/21/1990  Smokeless tobacco  . Not on file    History  Alcohol Use No    Family History  Problem Relation Age of Onset  . Heart failure Father   . Heart attack Brother   . Anemia Brother 46    mi    Review of Systems: As noted in history of present illness.  All other systems were reviewed and are negative.  Physical Exam: BP 130/70  Pulse 58  Ht 6' (1.829 m)  Wt 178 lb 9.6 oz (81.012 kg)  BMI 24.22 kg/m2  SpO2 93%  He is a pleasant white male in no acute distress. He is normocephalic, atraumatic. Pupils are equal round and reactive. Oropharynx is clear. Sclera clear. Neck is supple without JVD, adenopathy, thyromegaly, or bruits. Lungs are clear. Cardiac damage is a regular rate and rhythm without gallop, murmur, or click. Abdomen is soft and nontender without masses or bruits. He has no edema. Pedal pulses are good. He is alert oriented x3. Cranial nerves II through XII are intact. His exam is nonfocal.  LABORATORY DATA: Recent laboratory data from his primary care was reviewed and demonstrated excellent control of blood sugar and lipids. His potassium was 3.9.  Assessment / Plan: 1. Labile hypertension with history of hypertensive encephalopathy. It appears his blood pressure is well controlled now on combination of chlorthalidone and labetalol. He takes clonidine as needed for elevation. We will continue on his current therapy and followup again in 6 months.  2. Coronary disease with remote PTCA in 2003. He is asymptomatic. His last stress test was in 2007.  3. History of subarachnoid  hemorrhage.  4. History of TIA/stroke.

## 2012-04-09 ENCOUNTER — Emergency Department (HOSPITAL_COMMUNITY): Payer: Medicare Other

## 2012-04-09 ENCOUNTER — Telehealth: Payer: Self-pay | Admitting: Cardiology

## 2012-04-09 ENCOUNTER — Emergency Department (HOSPITAL_COMMUNITY)
Admission: EM | Admit: 2012-04-09 | Discharge: 2012-04-09 | Disposition: A | Discharge status: Home / Self Care | Payer: Medicare Other | Attending: Emergency Medicine | Admitting: Emergency Medicine

## 2012-04-09 DIAGNOSIS — I10 Essential (primary) hypertension: Secondary | ICD-10-CM | POA: Insufficient documentation

## 2012-04-09 DIAGNOSIS — Y9389 Activity, other specified: Secondary | ICD-10-CM | POA: Insufficient documentation

## 2012-04-09 DIAGNOSIS — I674 Hypertensive encephalopathy: Secondary | ICD-10-CM | POA: Insufficient documentation

## 2012-04-09 DIAGNOSIS — Z7982 Long term (current) use of aspirin: Secondary | ICD-10-CM | POA: Insufficient documentation

## 2012-04-09 DIAGNOSIS — E860 Dehydration: Secondary | ICD-10-CM | POA: Insufficient documentation

## 2012-04-09 DIAGNOSIS — W19XXXA Unspecified fall, initial encounter: Secondary | ICD-10-CM

## 2012-04-09 DIAGNOSIS — Z79899 Other long term (current) drug therapy: Secondary | ICD-10-CM | POA: Insufficient documentation

## 2012-04-09 DIAGNOSIS — Z9181 History of falling: Secondary | ICD-10-CM | POA: Insufficient documentation

## 2012-04-09 DIAGNOSIS — Z8679 Personal history of other diseases of the circulatory system: Secondary | ICD-10-CM | POA: Insufficient documentation

## 2012-04-09 DIAGNOSIS — W108XXA Fall (on) (from) other stairs and steps, initial encounter: Secondary | ICD-10-CM | POA: Insufficient documentation

## 2012-04-09 DIAGNOSIS — Z87891 Personal history of nicotine dependence: Secondary | ICD-10-CM | POA: Insufficient documentation

## 2012-04-09 DIAGNOSIS — E785 Hyperlipidemia, unspecified: Secondary | ICD-10-CM | POA: Insufficient documentation

## 2012-04-09 DIAGNOSIS — Z87828 Personal history of other (healed) physical injury and trauma: Secondary | ICD-10-CM | POA: Insufficient documentation

## 2012-04-09 DIAGNOSIS — Z87442 Personal history of urinary calculi: Secondary | ICD-10-CM | POA: Insufficient documentation

## 2012-04-09 DIAGNOSIS — Z8673 Personal history of transient ischemic attack (TIA), and cerebral infarction without residual deficits: Secondary | ICD-10-CM | POA: Insufficient documentation

## 2012-04-09 DIAGNOSIS — S0993XA Unspecified injury of face, initial encounter: Secondary | ICD-10-CM | POA: Insufficient documentation

## 2012-04-09 DIAGNOSIS — G4730 Sleep apnea, unspecified: Secondary | ICD-10-CM | POA: Insufficient documentation

## 2012-04-09 DIAGNOSIS — I2510 Atherosclerotic heart disease of native coronary artery without angina pectoris: Secondary | ICD-10-CM | POA: Insufficient documentation

## 2012-04-09 DIAGNOSIS — Y929 Unspecified place or not applicable: Secondary | ICD-10-CM | POA: Insufficient documentation

## 2012-04-09 DIAGNOSIS — Z85819 Personal history of malignant neoplasm of unspecified site of lip, oral cavity, and pharynx: Secondary | ICD-10-CM | POA: Insufficient documentation

## 2012-04-09 DIAGNOSIS — I252 Old myocardial infarction: Secondary | ICD-10-CM | POA: Insufficient documentation

## 2012-04-09 DIAGNOSIS — Z8669 Personal history of other diseases of the nervous system and sense organs: Secondary | ICD-10-CM | POA: Insufficient documentation

## 2012-04-09 LAB — URINALYSIS, ROUTINE W REFLEX MICROSCOPIC
Bilirubin Urine: NEGATIVE
Ketones, ur: 15 mg/dL — AB
Nitrite: NEGATIVE
Urobilinogen, UA: 0.2 mg/dL (ref 0.0–1.0)
pH: 7 (ref 5.0–8.0)

## 2012-04-09 LAB — BASIC METABOLIC PANEL
BUN: 26 mg/dL — ABNORMAL HIGH (ref 6–23)
Creatinine, Ser: 1.11 mg/dL (ref 0.50–1.35)
GFR calc Af Amer: 75 mL/min — ABNORMAL LOW (ref >90)
GFR calc non Af Amer: 65 mL/min — ABNORMAL LOW (ref >90)

## 2012-04-09 LAB — CBC WITH DIFFERENTIAL/PLATELET
Basophils Relative: 0 % (ref 0–1)
Eosinophils Absolute: 0 10*3/uL (ref 0.0–0.7)
HCT: 42.2 % (ref 39.0–52.0)
Hemoglobin: 14.9 g/dL (ref 13.0–17.0)
MCH: 33.9 pg (ref 26.0–34.0)
MCHC: 35.3 g/dL (ref 30.0–36.0)
Monocytes Absolute: 1 10*3/uL (ref 0.1–1.0)
Monocytes Relative: 9 % (ref 3–12)

## 2012-04-09 LAB — URINE MICROSCOPIC-ADD ON

## 2012-04-09 NOTE — ED Notes (Signed)
Pt has been having multiple falls recently and today fell down 3 steps.  Pt is in KED and fall was witnessed and wife reported no LOC.  Wife has been reporting periods of confusion even prior to fall.

## 2012-04-09 NOTE — Telephone Encounter (Signed)
I would have him see someone with [place] [facility]. Whatever office is most convenient for him.  Peter Swaziland MD, [facility]

## 2012-04-09 NOTE — ED Notes (Signed)
Pt wife states that he is weak and that he was unable to get up after his initial fall this morning. States that his blood pressure has increased as well today.

## 2012-04-09 NOTE — ED Notes (Signed)
Ambulated patient  

## 2012-04-09 NOTE — Telephone Encounter (Signed)
Symptoms sound nonspecific with generalized weakness. If he did not have frank syncope and vital signs are stable, I would recommend that he see his primary care physician as there is a broad differential diagnosis for these problems and it is not clear that this is a primary cardiac issue. If he is unable to get an appointment I would be happy to see him today.  Benjamin Park 04/09/2012 9:09 AM

## 2012-04-09 NOTE — ED Provider Notes (Signed)
History     CSN: [id number]  Arrival date & time 04/09/12  1105   First MD Initiated Contact with Patient 04/09/12 1126      Chief Complaint  Patient presents with  . Fall    (Consider location/radiation/quality/duration/timing/severity/associated sxs/prior treatment) HPI Benjamin Park is a 72 year old male with a past medical history significant for previous CVA, multiple TIAs, MI, throat cancer, hypertension and hypertensive encephalopathy presents the emergency department with chief complaint of fall.  Patient is here with his wife who provides much of the history. Patient's wife states that he has had cough and general malaise place for the past few days.  This morning the patient fell and was unable to get himself up off the ground.  He was seen and evaluated by emergency personnel who recommended that patient see his provider outside of the ED.  Patient then fell again off of deck about 18 inches high onto his face.  Patient's wife denies that he lost consciousness.  Patient did hit the right side of his face in the fall.  Patient was again unable to stand.  Patient does have some residual gait abnormality.  He has a left foot inversion.  He does fall frequently to do this.  Wife states that the patient is at baseline mental status.  He denies any chest pain, shortness of breath.  No difficulty with speech.  No new weaknesses or abnormalities.  Patient denies dizziness or vertigo.   Past Medical History  Diagnosis Date  . Hypertension   . Hx of transient ischemic attack (TIA)   . Hyperlipidemia   . Coronary artery disease   . Throat cancer     radiation therapy  . Hypertensive encephalopathy   . ED (erectile dysfunction)   . Personal history of kidney stones   . Subarachnoid hemorrhage   . Sleep apnea     on CPAP  . Stroke   . Myocardial infarction     12/1991  . Headache   . Dysphagia   . Hypokalemia   . AKI (acute kidney injury) 02/23/2011    Past Surgical History    Procedure Date  . Angioplasty 1993    LAD  . Cardiac catheterization   . Coronary angioplasty   . Basal cell cancer     head  . Hernia repair   . Appendectomy   . Patent foramen ovale closure 2008  . Eye surgery     Family History  Problem Relation Age of Onset  . Heart failure Father   . Heart attack Brother   . Anemia Brother 41    mi    History  Substance Use Topics  . Smoking status: Former Smoker    Types: Cigarettes    Quit date: 09/21/1990  . Smokeless tobacco: Not on file  . Alcohol Use: No      Review of Systems Ten systems reviewed and are negative for acute change, except as noted in the HPI.   Allergies  Review of patient's allergies indicates no known allergies.  Home Medications   Current Outpatient Rx  Name  Route  Sig  Dispense  Refill  . ASPIRIN 325 MG PO TABS   Oral   Take 325 mg by mouth daily.           . CHLORTHALIDONE 25 MG PO TABS   Oral   Take 25 mg by mouth daily.         Marland Kitchen CLONIDINE HCL 0.2 MG PO TABS  Oral   Take 0.5 tablets (0.1 mg total) by mouth 2 (two) times daily as needed. For blood pressure greater than 160/100   30 tablet   0   . OMEGA-3 FATTY ACIDS 1000 MG PO CAPS   Oral   Take 1 g by mouth daily.          Marland Kitchen GLUCOSAMINE PO   Oral   Take 1 tablet by mouth daily.          Marland Kitchen LABETALOL HCL 100 MG PO TABS   Oral   Take 100 mg by mouth 2 (two) times daily.           Marland Kitchen LEVOTHYROXINE SODIUM 100 MCG PO TABS   Oral   Take 100 mcg by mouth daily.           Marland Kitchen LOVASTATIN 40 MG PO TABS   Oral   Take 80 mg by mouth at bedtime.          . ADULT MULTIVITAMIN W/MINERALS CH   Oral   Take 1 tablet by mouth daily.           Marland Kitchen STARCH (THICKENING) PO POWD   Oral   Take 227 g by mouth as needed (honey thick liquids).   30 Can   0     BP 167/121  Pulse 118  Temp 98.9 F (37.2 C) (Oral)  Resp 24  SpO2 94%  Physical Exam  Nursing note and vitals reviewed. Constitutional: He is oriented to  person, place, and time. He appears well-developed and well-nourished. No distress.  HENT:  Head: Normocephalic and atraumatic.  Eyes: Conjunctivae normal are normal. No scleral icterus.  Neck: Normal range of motion. Neck supple.  Cardiovascular: Normal rate, regular rhythm and normal heart sounds.   Pulmonary/Chest: Effort normal and breath sounds normal. No respiratory distress.  Abdominal: Soft. There is no tenderness.  Musculoskeletal: He exhibits no edema.  Neurological: He is alert and oriented to person, place, and time.       Speech is clear and goal oriented, follows commands Major Cranial nerves without deficit, no facial droop strength is equal in upper and lower extremities bilaterally including dorsiflexion and plantar flexion, strong and equal grip strength Sensation intact Resting tremor Normal finger to nose    Skin: Skin is warm and dry. He is not diaphoretic.  Psychiatric: His behavior is normal.    ED Course  Procedures (including critical care time)  Labs Reviewed  CBC WITH DIFFERENTIAL - Abnormal; Notable for the following:    Neutrophils Relative 87 (*)     Neutro Abs 8.9 (*)     Lymphocytes Relative 4 (*)     Lymphs Abs 0.4 (*)     All other components within normal limits  BASIC METABOLIC PANEL - Abnormal; Notable for the following:    Glucose, Bld 112 (*)     BUN 26 (*)     GFR calc non Af Amer 65 (*)     GFR calc Af Amer 75 (*)     All other components within normal limits  URINALYSIS, ROUTINE W REFLEX MICROSCOPIC - Abnormal; Notable for the following:    APPearance HAZY (*)     Ketones, ur 15 (*)     Protein, ur 30 (*)     All other components within normal limits  URINE MICROSCOPIC-ADD ON - Abnormal; Notable for the following:    Bacteria, UA FEW (*)     All other components within normal limits  No results found.   Date: 04/09/2012  Rate: 104  Rhythm: sinus tachycardia  QRS Axis: normal  Intervals: normal  ST/T Wave abnormalities:  normal  Conduction Disutrbances:none  Narrative Interpretation:   Old EKG Reviewed: unchanged    1. Fall   2. Mild dehydration       MDM  12:43 PM BP 167/121  Pulse 118  Temp 98.9 F (37.2 C) (Oral)  Resp 24  SpO2 94%  Patient's history concerning for possible developing infection causing weakness versus new onset stroke or TIAs.  Patient is hypertensive.   3:53 PM Filed Vitals:   04/09/12 1105 04/09/12 1308 04/09/12 1400 04/09/12 1500  BP: 167/121 154/88 105/61 102/74  Pulse: 118 96 50 94  Temp: 98.9 F (37.2 C)     TempSrc: Oral     Resp: 24 17 19    SpO2: 94% 94% 93% 96%    Patient work up negative for acute abnormality. Patient  Has a cane at home which she was not using.  He states he he is normally able to walk well with a cane and was not using it both times he fell.  Patient was able to ambulate well with minimal assistance in the hallway today.  Previously for just discharge at this time with followup with his neurologist and PCP.  He is instructed to use his cane at all times while walking and may need prescription for walker.  He appears to be mildly dehydrated and was encouraged to drink fluids.  At this time there does not appear to be any evidence of an acute emergency medical condition and the patient appears stable for discharge with appropriate outpatient follow up.Diagnosis was discussed with patient who verbalizes understanding and is agreeable to discharge. Pt case discussed with Dr. Rhunette Croft  who agrees with my plan.       Arthor Captain, PA-C 04/09/12 1559

## 2012-04-09 NOTE — Telephone Encounter (Signed)
F/u   Patient just fell off the porch being transport to [place] by EMS.

## 2012-04-09 NOTE — Telephone Encounter (Signed)
I spoke with pt's wife and gave her information from Dr. Excell Seltzer. She will let us know if pt is unable to get appt with Dr. Su Hilt today. She is also asking for recommendation for another primary care MD for pt. Does not want to see [facility] physician as pt has seen them in the past.  Will send to [name] for PCP recommendation

## 2012-04-09 NOTE — Telephone Encounter (Signed)
Pt fell this morning and EMS came here and his b/p has been elevated and they want him seen today

## 2012-04-09 NOTE — Telephone Encounter (Signed)
Spoke with pt's wife who reports pt fell this AM and was on floor for over an hour because she was unable to get him up. No injury noted.  EMS was called and wife reports they were told by EMS pt did not need to go to hospital but should contact his doctor's office to be seen. Wife reports blood pressure 164/109 and heart rate 105. She reports pt has had trouble walking since stroke in 2011 but that he has been weaker recently. He has no strength and can barely walk to bathroom. Wife thinks she would be able to get pt to car and then use wheelchair to get him into office. Will discuss with Dr. Excell Seltzer (DOD) and call wife with his recommendations.

## 2012-04-10 NOTE — Telephone Encounter (Signed)
Patient's wife called no answer.LMTC. 

## 2012-04-11 NOTE — Telephone Encounter (Signed)
Patient called he stated he was feeling better.Stated he went to ER 04/09/12. Xrays and lab work was done and he did not know the results.Dr.Jordan reviewed cxr,head ct,spine ct which revealed no acute findings.Patient advised to call in February to make a April appointment with Dr.Jordan.

## 2012-04-11 NOTE — ED Provider Notes (Signed)
Medical screening examination/treatment/procedure(s) were conducted as a shared visit with non-physician practitioner(s) and myself.  I personally evaluated the patient during the encounter  Derwood Kaplan, MD 04/11/12 1558

## 2012-06-11 ENCOUNTER — Other Ambulatory Visit: Payer: Self-pay | Admitting: Gastroenterology

## 2012-06-11 NOTE — Addendum Note (Signed)
Addended byVida Rigger on: 06/11/2012 02:23 PM   Modules accepted: Orders

## 2012-06-18 ENCOUNTER — Encounter (HOSPITAL_COMMUNITY): Payer: Self-pay | Admitting: Gastroenterology

## 2012-06-18 ENCOUNTER — Encounter (HOSPITAL_COMMUNITY)
Admission: RE | Disposition: A | Discharge status: Home / Self Care | Payer: Self-pay | Source: Ambulatory Visit | Attending: Gastroenterology

## 2012-06-18 ENCOUNTER — Ambulatory Visit (HOSPITAL_COMMUNITY)
Admission: RE | Admit: 2012-06-18 | Discharge: 2012-06-18 | Disposition: A | Discharge status: Home / Self Care | Payer: Medicare Other | Source: Ambulatory Visit | Attending: Gastroenterology | Admitting: Gastroenterology

## 2012-06-18 DIAGNOSIS — Z860100 Personal history of colon polyps, unspecified: Secondary | ICD-10-CM | POA: Insufficient documentation

## 2012-06-18 DIAGNOSIS — D126 Benign neoplasm of colon, unspecified: Secondary | ICD-10-CM | POA: Insufficient documentation

## 2012-06-18 DIAGNOSIS — K644 Residual hemorrhoidal skin tags: Secondary | ICD-10-CM | POA: Insufficient documentation

## 2012-06-18 DIAGNOSIS — Z09 Encounter for follow-up examination after completed treatment for conditions other than malignant neoplasm: Secondary | ICD-10-CM | POA: Insufficient documentation

## 2012-06-18 DIAGNOSIS — K5730 Diverticulosis of large intestine without perforation or abscess without bleeding: Secondary | ICD-10-CM | POA: Insufficient documentation

## 2012-06-18 DIAGNOSIS — K648 Other hemorrhoids: Secondary | ICD-10-CM | POA: Insufficient documentation

## 2012-06-18 HISTORY — PX: COLONOSCOPY: SHX5424

## 2012-06-18 HISTORY — DX: Hypothyroidism, unspecified: E03.9

## 2012-06-18 HISTORY — PX: HOT HEMOSTASIS: SHX5433

## 2012-06-18 SURGERY — COLONOSCOPY
Anesthesia: Moderate Sedation

## 2012-06-18 MED ORDER — FENTANYL CITRATE 0.05 MG/ML IJ SOLN
INTRAMUSCULAR | Status: DC | PRN
  Administered 2012-06-18 (×4): 25 ug via INTRAVENOUS

## 2012-06-18 MED ORDER — MIDAZOLAM HCL 5 MG/5ML IJ SOLN
INTRAMUSCULAR | Status: DC | PRN
  Administered 2012-06-18 (×5): 2 mg via INTRAVENOUS

## 2012-06-18 MED ORDER — MIDAZOLAM HCL 5 MG/ML IJ SOLN
INTRAMUSCULAR | Status: AC
  Filled 2012-06-18: qty 3

## 2012-06-18 MED ORDER — FENTANYL CITRATE 0.05 MG/ML IJ SOLN
INTRAMUSCULAR | Status: AC
  Filled 2012-06-18: qty 4

## 2012-06-18 MED ORDER — DIPHENHYDRAMINE HCL 50 MG/ML IJ SOLN
INTRAMUSCULAR | Status: AC
  Filled 2012-06-18: qty 1

## 2012-06-18 MED ORDER — SODIUM CHLORIDE 0.9 % IV SOLN
INTRAVENOUS | Status: DC
  Administered 2012-06-18: 500 mL via INTRAVENOUS

## 2012-06-18 NOTE — Op Note (Signed)
[facility] [address] [place], [address]   COLONOSCOPY PROCEDURE REPORT  PATIENT: Benjamin, Park  MR#: [id number] BIRTHDATE: 02-06-1941 , 71  yrs. old GENDER: Male ENDOSCOPIST: Vida Rigger, MD REFERRED XB:[id number] Su Hilt, M.D. PROCEDURE DATE:  06/18/2012 PROCEDURE:   Colonoscopy with snare polypectomy ASA CLASS:   Class II INDICATIONS:Patient's personal history of adenomatous colon polyps.  MEDICATIONS: Fentanyl 100 mcg IV and Versed 10 mg IV  DESCRIPTION OF PROCEDURE:   After the risks benefits and alternatives of the procedure were thoroughly explained, informed consent was obtained.  The Pentax Ped Colon [id number]  endoscope was introduced through the anus and advanced to the cecum, which was identified by both the appendix and ileocecal valve , limited by No adverse events experienced. abdominal pressure was needed to advance to the cecum and other than a few left-sided diverticuli no other abnormality was seen on insertion  The quality of the prep was adequate. .  The instrument was then slowly withdrawn as the colon was fully examined.the residual polyp and [place] ink was seen in the hepatic flexure and using both the regular snare and then the stiffer snare polyp was removed in a piecemeal fashion with minimal oozing which had stopped at the end of the procedure . Most of the polyp if not all was removed but we could not be sure do to edema and spasm after multiple snares and we elected not to use are argon plasma coagulator and the scope was slowly withdrawn and other than the left-sided diverticuli no additional polyps were seen and once back in the rectum anal rectal pull-through and retroflexion was done which revealed some hemorrhoids and the scope was straightened and readvanced  a short ways up  the left side of the colon air was suctioned scope removed and the patient tolerated the procedure well there was no obvious immediate  complication      FINDINGS:  1. Internal and external hemorrhoids 2. Few sigmoid diverticula 3. Residual hepatic flexure polyp and [place] ink tattoo seen and multiple snares done 4. Otherwise within normal limits to the cecum COMPLICATIONS: none  IMPRESSION:  above  RECOMMENDATIONS: await pathology to determine either surgical options or when to recheck and watch for signs of bleeding do to needing aspirin from previous CVA   _______________________________ eSigned:  Vida Rigger, MD 06/18/2012 9:59 AM   FA:[id number] Su Hilt, MD  PATIENT NAME:  Benjamin, Park MR#: [id number]

## 2012-06-19 ENCOUNTER — Encounter (HOSPITAL_COMMUNITY): Payer: Self-pay | Admitting: Gastroenterology

## 2012-07-11 ENCOUNTER — Telehealth: Payer: Self-pay | Admitting: Cardiology

## 2012-07-11 NOTE — Telephone Encounter (Signed)
Returned call to patient he stated his B/P elevated 217/132 pulse 79 beats/min,30 mins ago 205/130,158/104,135/96,120/82.Stated B/P has been going up this morning.Stated took clonidine 0.2 mg 1 hour ago.Stated has taken this morning labetalol 100 mg and chorthalidone 20 mg.Stated he has a headache no other symptoms,no dizziness,no blurred vision.Spoke to Dr.Jordan he advised to lay down and rest, recheck B/P in 1 hour and call back.

## 2012-07-11 NOTE — Telephone Encounter (Signed)
Returned call to patient he stated his B/P has come down at present 102/70.Stated he feels good.Advised to keep appointment 07/15/12 with Dr.Jordan.

## 2012-07-15 ENCOUNTER — Ambulatory Visit (INDEPENDENT_AMBULATORY_CARE_PROVIDER_SITE_OTHER): Payer: Medicare Other | Admitting: Cardiology

## 2012-07-15 ENCOUNTER — Encounter: Payer: Self-pay | Admitting: Cardiology

## 2012-07-15 VITALS — BP 112/76 | HR 69 | Ht 72.0 in | Wt 180.8 lb

## 2012-07-15 DIAGNOSIS — I10 Essential (primary) hypertension: Secondary | ICD-10-CM

## 2012-07-15 DIAGNOSIS — I2510 Atherosclerotic heart disease of native coronary artery without angina pectoris: Secondary | ICD-10-CM

## 2012-07-15 DIAGNOSIS — Z5189 Encounter for other specified aftercare: Secondary | ICD-10-CM

## 2012-07-15 DIAGNOSIS — T17908D Unspecified foreign body in respiratory tract, part unspecified causing other injury, subsequent encounter: Secondary | ICD-10-CM

## 2012-07-15 DIAGNOSIS — T17908A Unspecified foreign body in respiratory tract, part unspecified causing other injury, initial encounter: Secondary | ICD-10-CM | POA: Insufficient documentation

## 2012-07-15 NOTE — Progress Notes (Signed)
Benjamin Park Date of Birth: [birth year]   History of Present Illness: Benjamin Park is seen today for followup. He he has a history of labile hypertension and hypertensive encephalopathy. About once every 6 months he has a severely elevated blood pressure associated with confusion. This happened just this past week when his blood pressure went up to 205/130. He took a half dose of clonidine and after about 3 hours his blood pressure returned to normal. He also reports that at times his blood pressure will get too low and he gets very fatigued at those times as well. He does have chronic aspiration and coughs it every meal. He does not use his Thick-it. He denies any chest pain or shortness of breath.  Current Outpatient Prescriptions on File Prior to Visit  Medication Sig Dispense Refill  . aspirin 325 MG tablet Take 325 mg by mouth daily.        . chlorthalidone (HYGROTON) 25 MG tablet Take 25 mg by mouth daily.      . cloNIDine (CATAPRES) 0.2 MG tablet Take 0.5 tablets (0.1 mg total) by mouth 2 (two) times daily as needed. For blood pressure greater than 160/100  30 tablet  0  . fish oil-omega-3 fatty acids 1000 MG capsule Take 1 g by mouth daily.       . food thickener (THICK IT) POWD Take 227 g by mouth as needed (honey thick liquids).  30 Can  0  . GLUCOSAMINE PO Take 1 tablet by mouth daily.       Marland Kitchen labetalol (NORMODYNE) 100 MG tablet Take 100 mg by mouth 2 (two) times daily.        Marland Kitchen levothyroxine (SYNTHROID, LEVOTHROID) 100 MCG tablet Take 100 mcg by mouth daily.        Marland Kitchen lovastatin (MEVACOR) 40 MG tablet Take 80 mg by mouth at bedtime.       . Multiple Vitamin (MULITIVITAMIN WITH MINERALS) TABS Take 1 tablet by mouth daily.         No current facility-administered medications on file prior to visit.    No Known Allergies  Past Medical History  Diagnosis Date  . Hypertension   . Hx of transient ischemic attack (TIA)   . Hyperlipidemia   . Coronary artery disease   . Throat  cancer     radiation therapy  . Hypertensive encephalopathy   . ED (erectile dysfunction)   . Personal history of kidney stones   . Subarachnoid hemorrhage   . Sleep apnea     on CPAP  . Stroke   . Myocardial infarction     12/1991  . Headache   . Dysphagia   . Hypokalemia   . AKI (acute kidney injury) 02/23/2011  . Hypothyroidism     Past Surgical History  Procedure Laterality Date  . Angioplasty  1993    LAD  . Cardiac catheterization    . Coronary angioplasty    . Basal cell cancer      head  . Hernia repair    . Appendectomy    . Patent foramen ovale closure  2008  . Eye surgery    . Colonoscopy N/A 06/18/2012    Procedure: COLONOSCOPY;  Surgeon: Petra Kuba, MD;  Location: [facility] ENDOSCOPY;  Service: Endoscopy;  Laterality: N/A;  h&p in file-hope   . Hot hemostasis N/A 06/18/2012    Procedure: HOT HEMOSTASIS (ARGON PLASMA COAGULATION/BICAP);  Surgeon: Petra Kuba, MD;  Location: [facility] ENDOSCOPY;  Service: Endoscopy;  Laterality: N/A;    History  Smoking status  . Former Smoker  . Types: Cigarettes  . Quit date: 09/21/1990  Smokeless tobacco  . Not on file    History  Alcohol Use No    Family History  Problem Relation Age of Onset  . Heart failure Father   . Heart attack Brother   . Anemia Brother 80    mi    Review of Systems: As noted in history of present illness.  All other systems were reviewed and are negative.  Physical Exam: BP 112/76  Pulse 69  Ht 6' (1.829 m)  Wt 180 lb 12.8 oz (82.01 kg)  BMI 24.52 kg/m2  SpO2 99% He is a pleasant white male in no acute distress. He is normocephalic, atraumatic. Pupils are equal round and reactive. Oropharynx is clear. Sclera clear. Neck is supple without JVD, adenopathy, thyromegaly, or bruits. Lungs are clear. Cardiac exam reveals a regular rate and rhythm without gallop, murmur, or click. Abdomen is soft and nontender without masses or bruits. He has no edema. Pedal pulses are good. He is alert oriented x3.  Cranial nerves II through XII are intact. His exam is nonfocal.  LABORATORY DATA:   Assessment / Plan: 1. Labile hypertension with history of hypertensive encephalopathy. It appears his blood pressure is well controlled now on combination of chlorthalidone, labetalol, and Benicar. He takes clonidine as needed for elevation. We will continue on his current therapy and followup again in 6 months.  2. Coronary disease with remote PTCA in 2003. He is asymptomatic. His last stress test was in 2007.  3. History of subarachnoid hemorrhage.  4. History of TIA/stroke.  5. Chronic aspiration. This has clearly been documented on prior swallowing evaluation. I've encouraged him to use his thick it to minimize the risk for aspiration pneumonia.

## 2012-07-15 NOTE — Patient Instructions (Signed)
Continue your current medication. Use Clonidine as needed for elevated blood pressure.  You should use Thick-it to reduce your risk of aspiration.  I will see you in 6 months.

## 2012-07-22 ENCOUNTER — Ambulatory Visit: Payer: Self-pay | Admitting: Nurse Practitioner

## 2012-11-07 ENCOUNTER — Telehealth: Payer: Self-pay | Admitting: Neurology

## 2012-11-08 ENCOUNTER — Encounter: Payer: Self-pay | Admitting: Nurse Practitioner

## 2012-11-08 ENCOUNTER — Ambulatory Visit (INDEPENDENT_AMBULATORY_CARE_PROVIDER_SITE_OTHER): Payer: Medicare Other | Admitting: Nurse Practitioner

## 2012-11-08 VITALS — BP 142/90 | HR 66 | Ht 72.5 in | Wt 185.0 lb

## 2012-11-08 DIAGNOSIS — I6350 Cerebral infarction due to unspecified occlusion or stenosis of unspecified cerebral artery: Secondary | ICD-10-CM

## 2012-11-08 DIAGNOSIS — R259 Unspecified abnormal involuntary movements: Secondary | ICD-10-CM

## 2012-11-08 HISTORY — DX: Cerebral infarction due to unspecified occlusion or stenosis of unspecified cerebral artery: I63.50

## 2012-11-08 MED ORDER — TOPIRAMATE 25 MG PO TABS: ORAL_TABLET | ORAL | Status: DC

## 2012-11-08 NOTE — Patient Instructions (Addendum)
Will try low dose Topamax for tremor Drink plenty of fluids F/U in 3 months

## 2012-11-08 NOTE — Progress Notes (Signed)
Reason for visit followup for essential tremor HPI:  Benjamin Park, 72 year old white male returns for follow up  evaluation of left hand tremor. Initially evaluated by Dr. Terrace Arabia 08/21/11.  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  11/08/12. Patient was begun on Primidone at last visit,  Starting off with 25mg  daily then slowly increasing to  50mg  BID. The patient claims he took the medication for a few days and was drunk. He currently is not working but would like to go back to work if he can get control of his tremor , greater in left vs right hand.     ROS:  Tremors   Medications Current Outpatient Prescriptions on File Prior to Visit  Medication Sig Dispense Refill  . aspirin 325 MG tablet Take 325 mg by mouth daily.        Marland Kitchen BENICAR 20 MG tablet Take 20 mg by mouth daily.      . chlorthalidone (HYGROTON) 25 MG tablet Take 25 mg by mouth daily.      . cloNIDine (CATAPRES) 0.2 MG tablet Take 0.5 tablets (0.1 mg total) by mouth 2 (two) times daily as needed. For blood pressure greater than 160/100  30 tablet  0  . fish oil-omega-3 fatty acids 1000 MG capsule Take 1 g by mouth daily.       . food thickener (THICK IT) POWD Take 227 g by mouth as needed (honey thick liquids).  30 Can  0  . GLUCOSAMINE PO Take 1 tablet by mouth daily.       Marland Kitchen labetalol (NORMODYNE) 100 MG tablet Take 100 mg by mouth 2 (two) times daily.        Marland Kitchen levothyroxine (SYNTHROID, LEVOTHROID) 100 MCG tablet Take 100 mcg by mouth daily.        Marland Kitchen lovastatin (MEVACOR) 40 MG tablet Take  80 mg by mouth at bedtime.       . Multiple Vitamin (MULITIVITAMIN WITH MINERALS) TABS Take 1 tablet by mouth daily.        Marland Kitchen testosterone cypionate (DEPOTESTOTERONE CYPIONATE) 200 MG/ML injection        No current facility-administered medications on file prior to visit.    Allergies No Known Allergies  Physical Exam General: well developed, well nourished, seated, in no evident distress Head: head normocephalic and atraumatic. Oropharynx benign Neck: supple with no carotid  bruits Cardiovascular: regular rate and rhythm, no murmurs  Neurologic Exam Mental Status: Awake and fully alert. Oriented to place and time. Follows all commands. Speech and language normal.   Cranial Nerves:  Pupils equal, briskly reactive to light. Extraocular movements full without nystagmus. Visual fields full to confrontation. Hearing intact and symmetric to finger snap. Facial sensation intact. Face, tongue, palate move normally and symmetrically. Neck flexion and extension normal.  Motor: Normal bulk and tone. Normal strength in all tested extremity muscles.No focal weakness. No rigidity. Mild left hand postural tremor when performing finger to nose. Coordination: Rapid alternating movements normal in all extremities. Finger-to-nose and heel-to-shin performed accurately  bilaterally. No dysmetria  Gait and Station: Arises from chair with out use of arms  Narrow based steady gait. No difficulty with turns  Reflexes: 2+ and symmetric. Toes downgoing.     ASSESSMENT: Two-year history of left hand tremor, no significant weakness rigidity, mild postural component. Failed  Primidone due to side effects     PLAN: Will try low dose Topamax for tremor, made aware of  side effects Drink plenty of fluids F/U in 3 months, may try carbo dopa if this is unsuccessful  Nilda Riggs, GNP-BC APRN

## 2012-11-11 ENCOUNTER — Inpatient Hospital Stay (HOSPITAL_COMMUNITY)
Admission: EM | Admit: 2012-11-11 | Discharge: 2012-11-19 | DRG: 077 | Disposition: A | Discharge status: Skilled Nursing Facility | Payer: Medicare Other | Attending: Internal Medicine | Admitting: Internal Medicine

## 2012-11-11 ENCOUNTER — Inpatient Hospital Stay (HOSPITAL_COMMUNITY): Payer: Medicare Other

## 2012-11-11 ENCOUNTER — Emergency Department (HOSPITAL_COMMUNITY): Payer: Medicare Other

## 2012-11-11 DIAGNOSIS — E785 Hyperlipidemia, unspecified: Secondary | ICD-10-CM | POA: Diagnosis present

## 2012-11-11 DIAGNOSIS — Z9861 Coronary angioplasty status: Secondary | ICD-10-CM

## 2012-11-11 DIAGNOSIS — R269 Unspecified abnormalities of gait and mobility: Secondary | ICD-10-CM | POA: Diagnosis present

## 2012-11-11 DIAGNOSIS — Z87891 Personal history of nicotine dependence: Secondary | ICD-10-CM

## 2012-11-11 DIAGNOSIS — J9600 Acute respiratory failure, unspecified whether with hypoxia or hypercapnia: Secondary | ICD-10-CM

## 2012-11-11 DIAGNOSIS — R0989 Other specified symptoms and signs involving the circulatory and respiratory systems: Secondary | ICD-10-CM

## 2012-11-11 DIAGNOSIS — I2510 Atherosclerotic heart disease of native coronary artery without angina pectoris: Secondary | ICD-10-CM | POA: Diagnosis present

## 2012-11-11 DIAGNOSIS — I6350 Cerebral infarction due to unspecified occlusion or stenosis of unspecified cerebral artery: Secondary | ICD-10-CM

## 2012-11-11 DIAGNOSIS — G9340 Encephalopathy, unspecified: Secondary | ICD-10-CM | POA: Diagnosis present

## 2012-11-11 DIAGNOSIS — N529 Male erectile dysfunction, unspecified: Secondary | ICD-10-CM

## 2012-11-11 DIAGNOSIS — E860 Dehydration: Secondary | ICD-10-CM

## 2012-11-11 DIAGNOSIS — C140 Malignant neoplasm of pharynx, unspecified: Secondary | ICD-10-CM

## 2012-11-11 DIAGNOSIS — Z87442 Personal history of urinary calculi: Secondary | ICD-10-CM

## 2012-11-11 DIAGNOSIS — I160 Hypertensive urgency: Secondary | ICD-10-CM | POA: Diagnosis present

## 2012-11-11 DIAGNOSIS — R259 Unspecified abnormal involuntary movements: Secondary | ICD-10-CM

## 2012-11-11 DIAGNOSIS — R55 Syncope and collapse: Secondary | ICD-10-CM

## 2012-11-11 DIAGNOSIS — N182 Chronic kidney disease, stage 2 (mild): Secondary | ICD-10-CM | POA: Diagnosis present

## 2012-11-11 DIAGNOSIS — I129 Hypertensive chronic kidney disease with stage 1 through stage 4 chronic kidney disease, or unspecified chronic kidney disease: Secondary | ICD-10-CM | POA: Diagnosis present

## 2012-11-11 DIAGNOSIS — I161 Hypertensive emergency: Secondary | ICD-10-CM

## 2012-11-11 DIAGNOSIS — T17908D Unspecified foreign body in respiratory tract, part unspecified causing other injury, subsequent encounter: Secondary | ICD-10-CM

## 2012-11-11 DIAGNOSIS — B370 Candidal stomatitis: Secondary | ICD-10-CM | POA: Diagnosis present

## 2012-11-11 DIAGNOSIS — Z8673 Personal history of transient ischemic attack (TIA), and cerebral infarction without residual deficits: Secondary | ICD-10-CM

## 2012-11-11 DIAGNOSIS — R4182 Altered mental status, unspecified: Secondary | ICD-10-CM

## 2012-11-11 DIAGNOSIS — R4701 Aphasia: Secondary | ICD-10-CM | POA: Diagnosis present

## 2012-11-11 DIAGNOSIS — Z85819 Personal history of malignant neoplasm of unspecified site of lip, oral cavity, and pharynx: Secondary | ICD-10-CM

## 2012-11-11 DIAGNOSIS — I609 Nontraumatic subarachnoid hemorrhage, unspecified: Secondary | ICD-10-CM

## 2012-11-11 DIAGNOSIS — E039 Hypothyroidism, unspecified: Secondary | ICD-10-CM | POA: Diagnosis present

## 2012-11-11 DIAGNOSIS — G4733 Obstructive sleep apnea (adult) (pediatric): Secondary | ICD-10-CM | POA: Diagnosis present

## 2012-11-11 DIAGNOSIS — R1312 Dysphagia, oropharyngeal phase: Secondary | ICD-10-CM | POA: Diagnosis not present

## 2012-11-11 DIAGNOSIS — G4730 Sleep apnea, unspecified: Secondary | ICD-10-CM

## 2012-11-11 DIAGNOSIS — I10 Essential (primary) hypertension: Secondary | ICD-10-CM

## 2012-11-11 DIAGNOSIS — I674 Hypertensive encephalopathy: Principal | ICD-10-CM | POA: Diagnosis present

## 2012-11-11 DIAGNOSIS — I252 Old myocardial infarction: Secondary | ICD-10-CM

## 2012-11-11 DIAGNOSIS — Z8774 Personal history of (corrected) congenital malformations of heart and circulatory system: Secondary | ICD-10-CM

## 2012-11-11 DIAGNOSIS — J9819 Other pulmonary collapse: Secondary | ICD-10-CM | POA: Diagnosis present

## 2012-11-11 DIAGNOSIS — N179 Acute kidney failure, unspecified: Secondary | ICD-10-CM

## 2012-11-11 DIAGNOSIS — R5381 Other malaise: Secondary | ICD-10-CM | POA: Diagnosis present

## 2012-11-11 DIAGNOSIS — G40909 Epilepsy, unspecified, not intractable, without status epilepticus: Secondary | ICD-10-CM | POA: Diagnosis present

## 2012-11-11 DIAGNOSIS — E876 Hypokalemia: Secondary | ICD-10-CM | POA: Diagnosis present

## 2012-11-11 LAB — TROPONIN I: Troponin I: <0.3 ng/mL (ref <0.30)

## 2012-11-11 LAB — GLUCOSE, CAPILLARY
Glucose-Capillary: 123 mg/dL — ABNORMAL HIGH (ref 70–99)
Glucose-Capillary: 128 mg/dL — ABNORMAL HIGH (ref 70–99)

## 2012-11-11 LAB — MRSA PCR SCREENING: MRSA by PCR: NEGATIVE

## 2012-11-11 LAB — RAPID URINE DRUG SCREEN, HOSP PERFORMED
Barbiturates: NOT DETECTED
Benzodiazepines: NOT DETECTED

## 2012-11-11 LAB — COMPREHENSIVE METABOLIC PANEL
ALT: 15 U/L (ref 0–53)
Alkaline Phosphatase: 68 U/L (ref 39–117)
BUN: 23 mg/dL (ref 6–23)
CO2: 27 mEq/L (ref 19–32)
GFR calc Af Amer: 71 mL/min — ABNORMAL LOW (ref >90)
GFR calc non Af Amer: 61 mL/min — ABNORMAL LOW (ref >90)
Glucose, Bld: 119 mg/dL — ABNORMAL HIGH (ref 70–99)
Potassium: 3.4 mEq/L — ABNORMAL LOW (ref 3.5–5.1)
Sodium: 141 mEq/L (ref 135–145)
Total Bilirubin: 0.6 mg/dL (ref 0.3–1.2)
Total Protein: 7.3 g/dL (ref 6.0–8.3)

## 2012-11-11 LAB — POCT I-STAT 3, ART BLOOD GAS (G3+)
Acid-base deficit: 2 mmol/L (ref 0.0–2.0)
O2 Saturation: 99 %
TCO2: 25 mmol/L (ref 0–100)
pCO2 arterial: 40.2 mmHg (ref 35.0–45.0)

## 2012-11-11 LAB — DIFFERENTIAL
Eosinophils Absolute: 0.3 10*3/uL (ref 0.0–0.7)
Lymphocytes Relative: 11 % — ABNORMAL LOW (ref 12–46)
Lymphs Abs: 0.8 10*3/uL (ref 0.7–4.0)
Monocytes Relative: 6 % (ref 3–12)
Neutrophils Relative %: 78 % — ABNORMAL HIGH (ref 43–77)

## 2012-11-11 LAB — POCT I-STAT, CHEM 8
BUN: 23 mg/dL (ref 6–23)
Calcium, Ion: 1.2 mmol/L (ref 1.13–1.30)
Chloride: 105 mEq/L (ref 96–112)
HCT: 49 % (ref 39.0–52.0)
Sodium: 143 mEq/L (ref 135–145)

## 2012-11-11 LAB — CBC
Hemoglobin: 17 g/dL (ref 13.0–17.0)
MCH: 34.4 pg — ABNORMAL HIGH (ref 26.0–34.0)
MCV: 93.5 fL (ref 78.0–100.0)
Platelets: 170 10*3/uL (ref 150–400)
RBC: 4.94 MIL/uL (ref 4.22–5.81)
WBC: 7.2 10*3/uL (ref 4.0–10.5)

## 2012-11-11 LAB — POCT I-STAT TROPONIN I

## 2012-11-11 LAB — URINALYSIS, ROUTINE W REFLEX MICROSCOPIC
Glucose, UA: NEGATIVE mg/dL
Ketones, ur: NEGATIVE mg/dL
Leukocytes, UA: NEGATIVE
Nitrite: NEGATIVE
Specific Gravity, Urine: 1.011 (ref 1.005–1.030)
pH: 7.5 (ref 5.0–8.0)

## 2012-11-11 LAB — PROTIME-INR: INR: 1.05 (ref 0.00–1.49)

## 2012-11-11 MED ORDER — SODIUM CHLORIDE 0.9 % IV SOLN
INTRAVENOUS | Status: DC
  Administered 2012-11-11 – 2012-11-13 (×3): via INTRAVENOUS

## 2012-11-11 MED ORDER — SODIUM CHLORIDE 0.9 % IJ SOLN
3.0000 mL | Freq: Two times a day (BID) | INTRAMUSCULAR | Status: DC
  Administered 2012-11-12 – 2012-11-15 (×6): 3 mL via INTRAVENOUS

## 2012-11-11 MED ORDER — PANTOPRAZOLE SODIUM 40 MG IV SOLR
40.0000 mg | Freq: Two times a day (BID) | INTRAVENOUS | Status: DC
  Administered 2012-11-12 – 2012-11-13 (×4): 40 mg via INTRAVENOUS
  Filled 2012-11-11 (×8): qty 40

## 2012-11-11 MED ORDER — ENOXAPARIN SODIUM 40 MG/0.4ML ~~LOC~~ SOLN
40.0000 mg | SUBCUTANEOUS | Status: DC
  Filled 2012-11-11: qty 0.4

## 2012-11-11 MED ORDER — ASPIRIN 325 MG PO TABS
325.0000 mg | ORAL_TABLET | Freq: Every day | ORAL | Status: DC
  Administered 2012-11-12: 325 mg via ORAL
  Filled 2012-11-11 (×2): qty 1

## 2012-11-11 MED ORDER — BIOTENE DRY MOUTH MT LIQD
1.0000 "application " | Freq: Four times a day (QID) | OROMUCOSAL | Status: DC
  Administered 2012-11-12 – 2012-11-14 (×11): 15 mL via OROMUCOSAL

## 2012-11-11 MED ORDER — HYDRALAZINE HCL 20 MG/ML IJ SOLN
10.0000 mg | Freq: Once | INTRAMUSCULAR | Status: DC
  Filled 2012-11-11: qty 1

## 2012-11-11 MED ORDER — OMEGA-3 FATTY ACIDS 1000 MG PO CAPS: 1.0000 g | ORAL_CAPSULE | Freq: Every day | ORAL | Status: DC

## 2012-11-11 MED ORDER — CHLORHEXIDINE GLUCONATE 0.12 % MT SOLN
15.0000 mL | Freq: Two times a day (BID) | OROMUCOSAL | Status: DC
  Administered 2012-11-11: 15 mL via OROMUCOSAL
  Filled 2012-11-11: qty 15

## 2012-11-11 MED ORDER — LIDOCAINE HCL (CARDIAC) 20 MG/ML IV SOLN
INTRAVENOUS | Status: AC
  Filled 2012-11-11: qty 5

## 2012-11-11 MED ORDER — PROPOFOL 10 MG/ML IV EMUL
5.0000 ug/kg/min | INTRAVENOUS | Status: DC
  Administered 2012-11-11: 5 ug/kg/min via INTRAVENOUS
  Administered 2012-11-12: 20 ug/kg/min via INTRAVENOUS
  Filled 2012-11-11 (×2): qty 100

## 2012-11-11 MED ORDER — SODIUM CHLORIDE 0.9 % IV SOLN
1000.0000 mg | INTRAVENOUS | Status: AC
  Administered 2012-11-11: 1000 mg via INTRAVENOUS
  Filled 2012-11-11 (×2): qty 10

## 2012-11-11 MED ORDER — ETOMIDATE 2 MG/ML IV SOLN
INTRAVENOUS | Status: AC
  Administered 2012-11-11: 10 mg
  Filled 2012-11-11: qty 20

## 2012-11-11 MED ORDER — ACETAMINOPHEN 325 MG PO TABS: 650.0000 mg | ORAL_TABLET | Freq: Four times a day (QID) | ORAL | Status: DC | PRN

## 2012-11-11 MED ORDER — SIMVASTATIN 40 MG PO TABS
40.0000 mg | ORAL_TABLET | Freq: Every day | ORAL | Status: DC
  Filled 2012-11-11: qty 1

## 2012-11-11 MED ORDER — SODIUM CHLORIDE 0.9 % IV SOLN
500.0000 mg | Freq: Two times a day (BID) | INTRAVENOUS | Status: DC
  Administered 2012-11-11 – 2012-11-14 (×7): 500 mg via INTRAVENOUS
  Filled 2012-11-11 (×10): qty 5

## 2012-11-11 MED ORDER — ROCURONIUM BROMIDE 50 MG/5ML IV SOLN
INTRAVENOUS | Status: AC
  Filled 2012-11-11: qty 2

## 2012-11-11 MED ORDER — HYDRALAZINE HCL 20 MG/ML IJ SOLN: 10.0000 mg | INTRAMUSCULAR | Status: DC | PRN

## 2012-11-11 MED ORDER — LORAZEPAM 2 MG/ML IJ SOLN
2.0000 mg | INTRAMUSCULAR | Status: DC | PRN
  Administered 2012-11-11: 2 mg via INTRAVENOUS
  Filled 2012-11-11 (×2): qty 1

## 2012-11-11 MED ORDER — NICARDIPINE HCL IN NACL 20-0.86 MG/200ML-% IV SOLN
5.0000 mg/h | INTRAVENOUS | Status: DC
  Administered 2012-11-11: 5 mg/h via INTRAVENOUS
  Filled 2012-11-11: qty 200

## 2012-11-11 MED ORDER — OMEGA-3-ACID ETHYL ESTERS 1 G PO CAPS
1.0000 g | ORAL_CAPSULE | Freq: Every day | ORAL | Status: DC
Start: 2012-11-12 — End: 2012-11-12
  Filled 2012-11-11: qty 1

## 2012-11-11 MED ORDER — SODIUM CHLORIDE 0.9 % IV SOLN
500.0000 mg | Freq: Two times a day (BID) | INTRAVENOUS | Status: DC
  Filled 2012-11-11: qty 5

## 2012-11-11 MED ORDER — LABETALOL HCL 5 MG/ML IV SOLN
10.0000 mg | INTRAVENOUS | Status: DC | PRN
  Administered 2012-11-11: 10 mg via INTRAVENOUS
  Filled 2012-11-11 (×2): qty 4

## 2012-11-11 MED ORDER — LABETALOL HCL 5 MG/ML IV SOLN
20.0000 mg | Freq: Once | INTRAVENOUS | Status: AC
  Administered 2012-11-11: 20 mg via INTRAVENOUS
  Filled 2012-11-11: qty 4

## 2012-11-11 MED ORDER — ACETAMINOPHEN 650 MG RE SUPP
650.0000 mg | Freq: Four times a day (QID) | RECTAL | Status: DC | PRN
  Administered 2012-11-12: 650 mg via RECTAL
  Filled 2012-11-11: qty 1

## 2012-11-11 MED ORDER — PANTOPRAZOLE SODIUM 40 MG IV SOLR: 40.0000 mg | INTRAVENOUS | Status: DC

## 2012-11-11 MED ORDER — SUCCINYLCHOLINE CHLORIDE 20 MG/ML IJ SOLN
INTRAMUSCULAR | Status: AC
  Filled 2012-11-11: qty 1

## 2012-11-11 MED ORDER — LEVOTHYROXINE SODIUM 100 MCG PO TABS
100.0000 ug | ORAL_TABLET | Freq: Every day | ORAL | Status: DC
  Administered 2012-11-12: 100 ug via ORAL
  Filled 2012-11-11 (×2): qty 1

## 2012-11-11 MED ORDER — LORAZEPAM 2 MG/ML IJ SOLN
1.0000 mg | Freq: Once | INTRAMUSCULAR | Status: AC
  Administered 2012-11-11: 1 mg via INTRAVENOUS
  Filled 2012-11-11 (×2): qty 1

## 2012-11-11 NOTE — Code Documentation (Signed)
72 yo male who drove himself to a scheduled doctor's appt this afternoon.  At 1330, the office called his wife to say his speech had changed, and his behavior was bizarre. She immediately went to get him, and brought him to [facility] arriving at 1519. The EDP activated code stroke at 1538 based on expressive aphasia and inability to follow commands. His NIHSS is 5:  1 for missing the month, 2 for inability to follow commands (global aphasia), 2 for aphasia. He is moving all 4 extremities, face is symmetrical, no obvious visual field cuts or gaze deviation. His wife gives a hx of several similar episodes that seem to be related to hypertensive states.  His BP was 192/133- 203/130. Dr. Roseanne Reno here and ordered Labetalol and ativan. BP responded nicely: 161/104.  Pt does seem to have a headache, moans occasionally, but unable to really say what is wrong.  He does say yes when asked if he has a headache. Wife aware of plan to admit. Hand-off done with ED RN.

## 2012-11-11 NOTE — ED Notes (Signed)
CBG 123 

## 2012-11-11 NOTE — ED Notes (Signed)
Pt very agitated and uncooperative and unable to follow command.Informed MRI tech to post pond.

## 2012-11-11 NOTE — ED Notes (Signed)
Code stroke called

## 2012-11-11 NOTE — ED Notes (Signed)
Pt more aggitated and vomiting brownish secretion.Pt intubated by ICU doctor.

## 2012-11-11 NOTE — Consult Note (Signed)
Referring Physician: Ranae Palms    Chief Complaint: code stroke  HPI:                                                                                                                                         Benjamin Park is an 72 y.o. male with known history of SAH who presented to ED today. Patient was at the doctors office for a wellness visit when he was noted to become more confused and not following commands. Per wife he has had episodes in the past where his BP increases significantly, he becomes confused, has no recollection of events and during one episode had incontinence. It has occurred multiple times since 2003 and all are similar manner. His PCP has prescribed Clonidine for him to take if he notes these episodes occuring and per wife this "seems to help at times".  Currently the frequency of his events occurs about "every few months".  On arrival to code stroke patient was unable to follow verbal commands but was moving all his extremities antigravity.  He shows no focal localizing or lateralizing deficit other than intermittent difficulty following commands. Patient has no memory plain events that occur during the time that his mental status changes. He typically also is very fatigued and sleeps for long periods once mental status changes resolve.  While examining patient he seemed to improve and was able to folow and answer verbal commands/questions better. . BP 203/131.  Patient was given 20 mg Labetalol and 1 mg IV ativan.   Date last known well: Date: 11/11/2012 Time last known well: Time: 13:00 tPA Given: No: recent SAH and minimal NIHSS  Past Medical History  Diagnosis Date  . Hypertension   . Hx of transient ischemic attack (TIA)   . Hyperlipidemia   . Coronary artery disease   . Throat cancer     radiation therapy  . Hypertensive encephalopathy   . ED (erectile dysfunction)   . Personal history of kidney stones   . Subarachnoid hemorrhage   . Sleep apnea     on CPAP  .  Stroke   . Myocardial infarction     12/1991  . Headache(784.0)   . Dysphagia   . Hypokalemia   . AKI (acute kidney injury) 02/23/2011  . Hypothyroidism   . Unspecified cerebral artery occlusion with cerebral infarction 11/08/2012    Past Surgical History  Procedure Laterality Date  . Angioplasty  1993    LAD  . Cardiac catheterization    . Coronary angioplasty    . Basal cell cancer      head  . Hernia repair    . Appendectomy    . Patent foramen ovale closure  2008  . Eye surgery    . Colonoscopy N/A 06/18/2012    Procedure: COLONOSCOPY;  Surgeon: Petra Kuba, MD;  Location: [facility] ENDOSCOPY;  Service: Endoscopy;  Laterality: N/A;  h&p in file-[facility]   . Hot hemostasis N/A 06/18/2012    Procedure: HOT HEMOSTASIS (ARGON PLASMA COAGULATION/BICAP);  Surgeon: Petra Kuba, MD;  Location: [facility] ENDOSCOPY;  Service: Endoscopy;  Laterality: N/A;    Family History  Problem Relation Age of Onset  . Heart failure Father   . Heart attack Brother   . Anemia Brother 12    mi   Social History:  reports that he quit smoking about 22 years ago. His smoking use included Cigarettes. He smoked 0.00 packs per day. He has never used smokeless tobacco. He reports that he does not drink alcohol or use illicit drugs.  Allergies: No Known Allergies  Medications:                                                                                                                           Current Facility-Administered Medications  Medication Dose Route Frequency Provider Last Rate Last Dose  . levETIRAcetam (KEPPRA) 1,000 mg in sodium chloride 0.9 % 100 mL IVPB  1,000 mg Intravenous STAT Ulice Dash, PA-C      . levETIRAcetam (KEPPRA) 500 mg in sodium chloride 0.9 % 100 mL IVPB  500 mg Intravenous Q12H Ulice Dash, PA-C       Current Outpatient Prescriptions  Medication Sig Dispense Refill  . aspirin 325 MG tablet Take 325 mg by mouth daily.        Marland Kitchen BENICAR 20 MG tablet Take 20 mg by mouth daily.      .  chlorthalidone (HYGROTON) 25 MG tablet Take 25 mg by mouth daily.      . cloNIDine (CATAPRES) 0.2 MG tablet Take 0.5 tablets (0.1 mg total) by mouth 2 (two) times daily as needed. For blood pressure greater than 160/100  30 tablet  0  . fish oil-omega-3 fatty acids 1000 MG capsule Take 1 g by mouth daily.       . food thickener (THICK IT) POWD Take 227 g by mouth as needed (honey thick liquids).  30 Can  0  . GLUCOSAMINE PO Take 1 tablet by mouth daily.       Marland Kitchen labetalol (NORMODYNE) 100 MG tablet Take 100 mg by mouth 2 (two) times daily.        Marland Kitchen levothyroxine (SYNTHROID, LEVOTHROID) 100 MCG tablet Take 100 mcg by mouth daily.        Marland Kitchen lovastatin (MEVACOR) 40 MG tablet Take 80 mg by mouth at bedtime.       . Multiple Vitamin (MULITIVITAMIN WITH MINERALS) TABS Take 1 tablet by mouth daily.        Marland Kitchen nystatin (MYCOSTATIN) 100000 UNIT/ML suspension       . testosterone cypionate (DEPOTESTOTERONE CYPIONATE) 200 MG/ML injection       . topiramate (TOPAMAX) 25 MG tablet 1 po hs for 1 week then 2 at hs  60 tablet  3     ROS:  History obtained from wife  General ROS: negative for - chills, fatigue, fever, night sweats, weight gain or weight loss Psychological ROS: negative for - behavioral disorder, hallucinations, memory difficulties, mood swings or suicidal ideation Ophthalmic ROS: negative for - blurry vision, double vision, eye pain or loss of vision ENT ROS: negative for - epistaxis, nasal discharge, oral lesions, sore throat, tinnitus or vertigo Allergy and Immunology ROS: negative for - hives or itchy/watery eyes Hematological and Lymphatic ROS: negative for - bleeding problems, bruising or swollen lymph nodes Endocrine ROS: negative for - galactorrhea, hair pattern changes, polydipsia/polyuria or temperature intolerance Respiratory ROS: negative for - cough,  hemoptysis, shortness of breath or wheezing Cardiovascular ROS: negative for - chest pain, dyspnea on exertion, edema or irregular heartbeat Gastrointestinal ROS: negative for - abdominal pain, diarrhea, hematemesis, nausea/vomiting or stool incontinence Genito-Urinary ROS: negative for - dysuria, hematuria, incontinence or urinary frequency/urgency Musculoskeletal ROS: negative for - joint swelling or muscular weakness Neurological ROS: as noted in HPI Dermatological ROS: negative for rash and skin lesion changes  Neurologic Examination:                                                                                                      Blood pressure 165/120, pulse 91, temperature 97.7 F (36.5 C), temperature source Oral, resp. rate 18, SpO2 100.00%.  Mental Status: Alert, not oriented, at times will answer appropriately and other times inappropriately. Follows visual commands but not verbal.  Cranial Nerves: II: Discs flat bilaterally; Visual fields grossly normal, pupils equal, round, reactive to light and accommodation III,IV, VI: ptosis not present, extra-ocular motions intact bilaterally V,VII: smile symmetric, facial light touch sensation normal bilaterally VIII: hearing normal bilaterally IX,X: gag reflex present XI: bilateral shoulder shrug XII: midline tongue extension Motor: All extremities antigravity.  Tone and bulk:normal tone throughout; no atrophy noted Sensory: Pinprick and light touch intact throughout, bilaterally Deep Tendon Reflexes:  Right: Upper Extremity   Left: Upper extremity   biceps (C-5 to C-6) 2/4   biceps (C-5 to C-6) 2/4 tricep (C7) 2/4    triceps (C7) 2/4 Brachioradialis (C6) 2/4  Brachioradialis (C6) 2/4  Lower Extremity Lower Extremity  quadriceps (L-2 to L-4) 2/4   quadriceps (L-2 to L-4) 2/4 Achilles (S1) 2/4   Achilles (S1) 2/4  Plantars: Right: downgoing   Left: downgoing Cerebellar: normal finger-to-nose,  normal heel-to-shin test CV:  pulses palpable throughout    Results for orders placed during the hospital encounter of 11/11/12 (from the past 48 hour(s))  PROTIME-INR     Status: None   Collection Time    11/11/12  3:46 PM      Result Value Range   Prothrombin Time 13.5  11.6 - 15.2 seconds   INR 1.05  0.00 - 1.49  APTT     Status: None   Collection Time    11/11/12  3:46 PM      Result Value Range   aPTT 36  24 - 37 seconds  CBC     Status: Abnormal   Collection Time    11/11/12  3:46 PM      Result Value Range   WBC 7.2  4.0 - 10.5 K/uL   RBC 4.94  4.22 - 5.81 MIL/uL   Hemoglobin 17.0  13.0 - 17.0 g/dL   HCT 16.1  09.6 - 04.5 %   MCV 93.5  78.0 - 100.0 fL   MCH 34.4 (*) 26.0 - 34.0 pg   MCHC 36.8 (*) 30.0 - 36.0 g/dL   RDW 40.9  81.1 - 91.4 %   Platelets 170  150 - 400 K/uL  DIFFERENTIAL     Status: Abnormal   Collection Time    11/11/12  3:46 PM      Result Value Range   Neutrophils Relative % 78 (*) 43 - 77 %   Neutro Abs 5.6  1.7 - 7.7 K/uL   Lymphocytes Relative 11 (*) 12 - 46 %   Lymphs Abs 0.8  0.7 - 4.0 K/uL   Monocytes Relative 6  3 - 12 %   Monocytes Absolute 0.5  0.1 - 1.0 K/uL   Eosinophils Relative 4  0 - 5 %   Eosinophils Absolute 0.3  0.0 - 0.7 K/uL   Basophils Relative 1  0 - 1 %   Basophils Absolute 0.0  0.0 - 0.1 K/uL  POCT I-STAT, CHEM 8     Status: Abnormal   Collection Time    11/11/12  3:56 PM      Result Value Range   Sodium 143  135 - 145 mEq/L   Potassium 3.5  3.5 - 5.1 mEq/L   Chloride 105  96 - 112 mEq/L   BUN 23  6 - 23 mg/dL   Creatinine, Ser 7.82  0.50 - 1.35 mg/dL   Glucose, Bld 956 (*) 70 - 99 mg/dL   Calcium, Ion 2.13  0.86 - 1.30 mmol/L   TCO2 27  0 - 100 mmol/L   Hemoglobin 16.7  13.0 - 17.0 g/dL   HCT 57.8  46.9 - 62.9 %  GLUCOSE, CAPILLARY     Status: Abnormal   Collection Time    11/11/12  4:00 PM      Result Value Range   Glucose-Capillary 123 (*) 70 - 99 mg/dL   Comment 1 Documented in Chart     Comment 2 Notify RN    POCT I-STAT TROPONIN I      Status: None   Collection Time    11/11/12  4:08 PM      Result Value Range   Troponin i, poc 0.00  0.00 - 0.08 ng/mL   Comment 3            Comment: Due to the release kinetics of cTnI,     a negative result within the first hours     of the onset of symptoms does not rule out     myocardial infarction with certainty.     If myocardial infarction is still suspected,     repeat the test at appropriate intervals.   Ct Head Wo Contrast  11/11/2012   *RADIOLOGY REPORT*  Clinical Data: Severe headache, confusion and slurred speech  CT HEAD WITHOUT CONTRAST  Technique:  Contiguous axial images were obtained from the base of the skull through the vertex without contrast.  Comparison: Most recent prior head CT 04/09/2012  Findings: No acute intracranial hemorrhage, acute infarction, mass lesion, mass effect, midline shift or hydrocephalus.  Gray-white differentiation is preserved throughout.  Stable appearance of advanced cerebral and cerebellar volume loss and mild -  moderate chronic microvascular ischemic white matter changes.  No focal soft tissue abnormality or calvarial abnormality.  Globes and orbits are intact and unremarkable.  Debris layer within the left maxillary sinus.  Mastoid air cells are normally aerated.  IMPRESSION:  1.  No acute intracranial abnormality. 2.  Stable age advanced atrophy and mild chronic ischemic microvascular white matter disease. 3.  Left maxillary paranasal sinus disease.  These results were called by telephone on 11/11/2012 at 4:00 p.m. to Dr. Roseanne Reno, who verbally acknowledged these results.   Original Report Authenticated By: Malachy Moan, M.D.       Assessment: 72 y.o. male presenting to ED with AMS and receptive aphasia. 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 Of consideration as well, although  less likely, symptoms may be manifestations of hypertensive encephalopathy.   Recommend: 1) MRI brain to evaluate for signs of hypertensive encephalopathy such as PRES, as well as to rule out low likelihood of acute stroke (ordered) 2) EEG , routine adult (ordered) 3) load with 1 gram IV Keppra and maintenance dose of 500 mg IV Keepra BID. (ordered)   Felicie Morn PA-C [facility] [phone]  11/11/2012, 4:31 PM  I personally participate in this patient's evaluation and management including neurological examination, as well as formulating the above clinical assessment and management recommendations.  Venetia Maxon M.D. [facility] [phone]

## 2012-11-11 NOTE — ED Notes (Signed)
Yelverton MD at bedside. Stroke like Sx with Hx of same

## 2012-11-11 NOTE — H&P (Signed)
PULMONARY  / CRITICAL CARE MEDICINE  Name: Benjamin Park MRN: [id number] DOB: 1940-06-29    ADMISSION DATE:  11/11/2012 CONSULTATION DATE:  11/11/2012  REFERRING MD :  [facility] PRIMARY SERVICE:  PCCM  CHIEF COMPLAINT:  Acute encephalopathy  BRIEF PATIENT DESCRIPTION: 72 yo with past medical history of TIA (2011) and SAH (2011) as well as intermittent episodes of encephalopathy associated with hypertension since 2003  brought to ED with altered mental status and hypertension.  In ED: developed emesis - intubated for airway protection.  CT revealed no acute findings.  Seizures were suspected and Keppra given.  PCCM was consulted.  SIGNIFICANT EVENTS / STUDIES:  8/25  Head CT >>> nad, chronic changes, left maxillary / paranasal sinus diease  LINES / TUBES: OETT 8/25 >>> OGT 8/25 >>> Foley 8/25 >>>  CULTURES:  ANTIBIOTICS:  The patient is encephalopathic and unable to provide history, which was obtained for available medical records.  HISTORY OF PRESENT ILLNESS:  72 yo with past medical history of TIA (2011) and SAH (2011) as well as intermittent episodes of encephalopathy associated with hypertension since 2003  brought to ED with altered mental status and hypertension.  In ED: developed emesis - intubated for airway protection.  CT revealed no acute findings.  Seizures were suspected and Keppra given.  PCCM was consulted.  PAST MEDICAL HISTORY :  Past Medical History  Diagnosis Date  . Hypertension   . Hx of transient ischemic attack (TIA)   . Hyperlipidemia   . Coronary artery disease   . Throat cancer     radiation therapy  . Hypertensive encephalopathy   . ED (erectile dysfunction)   . Personal history of kidney stones   . Subarachnoid hemorrhage   . Sleep apnea     on CPAP  . Stroke   . Myocardial infarction     12/1991  . Headache(784.0)   . Dysphagia   . Hypokalemia   . AKI (acute kidney injury) 02/23/2011  . Hypothyroidism   . Unspecified cerebral artery occlusion  with cerebral infarction 11/08/2012   Past Surgical History  Procedure Laterality Date  . Angioplasty  1993    LAD  . Cardiac catheterization    . Coronary angioplasty    . Basal cell cancer      head  . Hernia repair    . Appendectomy    . Patent foramen ovale closure  2008  . Eye surgery    . Colonoscopy N/A 06/18/2012    Procedure: COLONOSCOPY;  Surgeon: Petra Kuba, MD;  Location: [facility] ENDOSCOPY;  Service: Endoscopy;  Laterality: N/A;  h&p in file-[facility]   . Hot hemostasis N/A 06/18/2012    Procedure: HOT HEMOSTASIS (ARGON PLASMA COAGULATION/BICAP);  Surgeon: Petra Kuba, MD;  Location: [facility] ENDOSCOPY;  Service: Endoscopy;  Laterality: N/A;   Prior to Admission medications   Medication Sig Start Date End Date Taking? Authorizing Provider  aspirin 325 MG tablet Take 325 mg by mouth daily.     Yes Historical Provider, MD  chlorthalidone (HYGROTON) 25 MG tablet Take 25 mg by mouth daily.   Yes Historical Provider, MD  cloNIDine (CATAPRES) 0.2 MG tablet Take 0.1 mg by mouth 2 (two) times daily as needed (for blood pressure greater than 160/100).   Yes Historical Provider, MD  fish oil-omega-3 fatty acids 1000 MG capsule Take 1 g by mouth daily.    Yes Historical Provider, MD  GLUCOSAMINE PO Take 1 tablet by mouth daily.    Yes Historical  Provider, MD  labetalol (NORMODYNE) 100 MG tablet Take 100 mg by mouth 2 (two) times daily.     Yes Historical Provider, MD  levothyroxine (SYNTHROID, LEVOTHROID) 100 MCG tablet Take 100 mcg by mouth daily.     Yes Historical Provider, MD  lovastatin (MEVACOR) 40 MG tablet Take 80 mg by mouth at bedtime.    Yes Historical Provider, MD  Multiple Vitamin (MULITIVITAMIN WITH MINERALS) TABS Take 1 tablet by mouth daily.     Yes Historical Provider, MD  olmesartan (BENICAR) 20 MG tablet Take 20 mg by mouth at bedtime.   Yes Historical Provider, MD  testosterone cypionate (DEPOTESTOTERONE CYPIONATE) 200 MG/ML injection Inject 200 mg into the muscle every 28  (twenty-eight) days.  07/08/12  Yes Historical Provider, MD  topiramate (TOPAMAX) 25 MG tablet Take 25-50 mg by mouth at bedtime. Take 1 tablet for 1 week starting 11/08/12 then increase to 2 tablets every night   Yes Historical Provider, MD   No Known Allergies  FAMILY HISTORY:  Family History  Problem Relation Age of Onset  . Heart failure Father   . Heart attack Brother   . Anemia Brother 60    mi   SOCIAL HISTORY:  reports that he quit smoking about 22 years ago. His smoking use included Cigarettes. He smoked 0.00 packs per day. He has never used smokeless tobacco. He reports that he does not drink alcohol or use illicit drugs.  REVIEW OF SYSTEMS:  Unable to provide.  INTERVAL HISTORY:  VITAL SIGNS: Temp:  [97.7 F (36.5 C)] 97.7 F (36.5 C) (08/25 1529) Pulse Rate:  [77-95] 95 (08/25 1830) Resp:  [13-23] 23 (08/25 1830) BP: (165-214)/(117-131) 195/125 mmHg (08/25 1830) SpO2:  [97 %-100 %] 100 % (08/25 1830) FiO2 (%):  [40 %] 40 % (08/25 1946) Weight:  [83.915 kg (185 lb)] 83.915 kg (185 lb) (08/25 1946)  HEMODYNAMICS:   VENTILATOR SETTINGS: Vent Mode:  [-] PRVC FiO2 (%):  [40 %] 40 % Set Rate:  [15 bmp] 15 bmp Vt Set:  [500 mL] 500 mL PEEP:  [5 cmH20] 5 cmH20  INTAKE / OUTPUT: Intake/Output   None    PHYSICAL EXAMINATION: General:  Mechanically ventilated, synchronous Neuro:  Encephalopathic, nonfocal, cough / gag absent HEENT:  PERRL, edentulous, OETT / OGT Cardiovascular:  RRR, no m/r/g Lungs:  Bilateral diminished air entry, no w/r/r Abdomen:  Soft, nontender, bowel sounds diminished Musculoskeletal:  Moves all extremities, no edema Skin:  Intact  LABS:  Recent Labs Lab 11/11/12 1546 11/11/12 1556  HGB 17.0 16.7  WBC 7.2  --   PLT 170  --   NA 141 143  K 3.4* 3.5  CL 105 105  CO2 27  --   GLUCOSE 119* 123*  BUN 23 23  CREATININE 1.17 1.30  CALCIUM 9.4  --   AST 18  --   ALT 15  --   ALKPHOS 68  --   BILITOT 0.6  --   PROT 7.3  --    ALBUMIN 3.9  --   APTT 36  --   INR 1.05  --   TROPONINI <0.30  --     Recent Labs Lab 11/11/12 1600  GLUCAP 123*   CXR:  8/25 >>>  ASSESSMENT / PLAN:  PULMONARY A:  Acute respiratory failure in setting of acute encephalopathy.  Intubated for airway protection. P:   Gaol SpO2>92, pH>7.30 Full mechanical support Daily SBT Trend ABG / CXR  CARDIOVASCULAR A: HTN.  Hypertensive emergency (  not clear if primary or in setting of seizures). P:  Goal SBP = 160 Labetalol / Hydralazine PRN  RENAL A:  No active issues. P:   Trend BMP NS@75   GASTROINTESTINAL A:  Small amount of coffee ground emesis (possible tongue biting with seizures / epistaxis related to hypertension. P:   NPO as intubated TF if remains intubated > 24 hours Protonix q12h  HEMATOLOGIC A:  No active issues. P:  Trend CBC SCDs for DVT Px  INFECTIOUS A:  No active issues. P:   Defer cultures / antibiotics  ENDOCRINE  A:  Hypothyroidism.  P:   Synthroid as preadmission  NEUROLOGIC A:  Acute encephalopathy (hypertensive). Possible seizures. No evidence of CNS hemorrhage.  History of TIA, SDH. P:   Per Neurology (seen in ED) Carotid Doppler Brain MRI Goal RASS 0 to -1 Propofol gtt ASA / Zocor Keppra load and maintenance EEG  I have personally obtained a history, examined the patient, evaluated laboratory and imaging results, formulated the assessment and plan and placed orders.  CRITICAL CARE:  The patient is critically ill with multiple organ systems failure and requires high complexity decision making for assessment and support, frequent evaluation and titration of therapies, application of advanced monitoring technologies and extensive interpretation of multiple databases. Critical Care Time devoted to patient care services described in this note is 60 minutes.   Lonia Farber, MD Pulmonary and Critical Care Medicine [facility] Pager: [phone]  11/11/2012,  7:55 PM

## 2012-11-11 NOTE — ED Provider Notes (Signed)
CSN: [id number]     Arrival date & time 11/11/12  1519 History     First MD Initiated Contact with Patient 11/11/12 1541     Chief Complaint  Patient presents with  . Code Stroke   (Consider location/radiation/quality/duration/timing/severity/associated sxs/prior Treatment) HPI  Pt present from his PMD's office for acute onset speech changes and confusion. Last seen normal by wife at 1130 today. Pt is unable to contribute to history due to AMS. Level V caveat applies. Per wife has had multiple similar episodes in the past which she states is associated with increased BP. Pt with SAH in 2011 Past Medical History  Diagnosis Date  . Hypertension   . Hx of transient ischemic attack (TIA)   . Hyperlipidemia   . Coronary artery disease   . Throat cancer     radiation therapy  . Hypertensive encephalopathy   . ED (erectile dysfunction)   . Personal history of kidney stones   . Subarachnoid hemorrhage   . Sleep apnea     on CPAP  . Stroke   . Myocardial infarction     12/1991  . Headache(784.0)   . Dysphagia   . Hypokalemia   . AKI (acute kidney injury) 02/23/2011  . Hypothyroidism   . Unspecified cerebral artery occlusion with cerebral infarction 11/08/2012   Past Surgical History  Procedure Laterality Date  . Angioplasty  1993    LAD  . Cardiac catheterization    . Coronary angioplasty    . Basal cell cancer      head  . Hernia repair    . Appendectomy    . Patent foramen ovale closure  2008  . Eye surgery    . Colonoscopy N/A 06/18/2012    Procedure: COLONOSCOPY;  Surgeon: Petra Kuba, MD;  Location: [facility] ENDOSCOPY;  Service: Endoscopy;  Laterality: N/A;  h&p in file-[facility]   . Hot hemostasis N/A 06/18/2012    Procedure: HOT HEMOSTASIS (ARGON PLASMA COAGULATION/BICAP);  Surgeon: Petra Kuba, MD;  Location: [facility] ENDOSCOPY;  Service: Endoscopy;  Laterality: N/A;   Family History  Problem Relation Age of Onset  . Heart failure Father   . Heart attack Brother   . Anemia  Brother 75    mi   History  Substance Use Topics  . Smoking status: Former Smoker    Types: Cigarettes    Quit date: 09/21/1990  . Smokeless tobacco: Never Used  . Alcohol Use: No    Review of Systems  Unable to perform ROS   Allergies  Review of patient's allergies indicates no known allergies.  Home Medications   Current Outpatient Rx  Name  Route  Sig  Dispense  Refill  . aspirin 325 MG tablet   Oral   Take 325 mg by mouth daily.           Marland Kitchen BENICAR 20 MG tablet   Oral   Take 20 mg by mouth daily.         . chlorthalidone (HYGROTON) 25 MG tablet   Oral   Take 25 mg by mouth daily.         . cloNIDine (CATAPRES) 0.2 MG tablet   Oral   Take 0.5 tablets (0.1 mg total) by mouth 2 (two) times daily as needed. For blood pressure greater than 160/100   30 tablet   0   . fish oil-omega-3 fatty acids 1000 MG capsule   Oral   Take 1 g by mouth daily.          Marland Kitchen  food thickener (THICK IT) POWD   Oral   Take 227 g by mouth as needed (honey thick liquids).   30 Can   0   . GLUCOSAMINE PO   Oral   Take 1 tablet by mouth daily.          Marland Kitchen labetalol (NORMODYNE) 100 MG tablet   Oral   Take 100 mg by mouth 2 (two) times daily.           Marland Kitchen levothyroxine (SYNTHROID, LEVOTHROID) 100 MCG tablet   Oral   Take 100 mcg by mouth daily.           Marland Kitchen lovastatin (MEVACOR) 40 MG tablet   Oral   Take 80 mg by mouth at bedtime.          . Multiple Vitamin (MULITIVITAMIN WITH MINERALS) TABS   Oral   Take 1 tablet by mouth daily.           Marland Kitchen nystatin (MYCOSTATIN) 100000 UNIT/ML suspension               . testosterone cypionate (DEPOTESTOTERONE CYPIONATE) 200 MG/ML injection               . topiramate (TOPAMAX) 25 MG tablet      1 po hs for 1 week then 2 at hs   60 tablet   3    BP 165/120  Pulse 91  Temp(Src) 97.7 F (36.5 C) (Oral)  Resp 18  SpO2 100% Physical Exam  Nursing note and vitals reviewed. Constitutional: He is oriented to  person, place, and time. He appears well-developed and well-nourished. No distress.  HENT:  Head: Normocephalic and atraumatic.  Mouth/Throat: Oropharynx is clear and moist.  Eyes: EOM are normal. Pupils are equal, round, and reactive to light.  Neck: Normal range of motion. Neck supple.  Cardiovascular: Normal rate and regular rhythm.   Pulmonary/Chest: Effort normal and breath sounds normal. No respiratory distress. He has no wheezes. He has no rales.  Abdominal: Soft. Bowel sounds are normal. He exhibits no distension and no mass. There is no tenderness. There is no rebound and no guarding.  Musculoskeletal: Normal range of motion. He exhibits no edema and no tenderness.  Neurological: He is alert and oriented to person, place, and time.  Moving all ext without deficit and intermittently following command. Nonsensical and limited speech. No slurring.   Skin: Skin is warm and dry. No rash noted. No erythema.    ED Course   Procedures (including critical care time)  Labs Reviewed  CBC - Abnormal; Notable for the following:    MCH 34.4 (*)    MCHC 36.8 (*)    All other components within normal limits  DIFFERENTIAL - Abnormal; Notable for the following:    Neutrophils Relative % 78 (*)    Lymphocytes Relative 11 (*)    All other components within normal limits  GLUCOSE, CAPILLARY - Abnormal; Notable for the following:    Glucose-Capillary 123 (*)    All other components within normal limits  POCT I-STAT, CHEM 8 - Abnormal; Notable for the following:    Glucose, Bld 123 (*)    All other components within normal limits  ETHANOL  PROTIME-INR  APTT  COMPREHENSIVE METABOLIC PANEL  TROPONIN I  URINE RAPID DRUG SCREEN (HOSP PERFORMED)  URINALYSIS, ROUTINE W REFLEX MICROSCOPIC   Ct Head Wo Contrast  11/11/2012   *RADIOLOGY REPORT*  Clinical Data: Severe headache, confusion and slurred speech  CT HEAD WITHOUT  CONTRAST  Technique:  Contiguous axial images were obtained from the base  of the skull through the vertex without contrast.  Comparison: Most recent prior head CT 04/09/2012  Findings: No acute intracranial hemorrhage, acute infarction, mass lesion, mass effect, midline shift or hydrocephalus.  Gray-white differentiation is preserved throughout.  Stable appearance of advanced cerebral and cerebellar volume loss and mild - moderate chronic microvascular ischemic white matter changes.  No focal soft tissue abnormality or calvarial abnormality.  Globes and orbits are intact and unremarkable.  Debris layer within the left maxillary sinus.  Mastoid air cells are normally aerated.  IMPRESSION:  1.  No acute intracranial abnormality. 2.  Stable age advanced atrophy and mild chronic ischemic microvascular white matter disease. 3.  Left maxillary paranasal sinus disease.  These results were called by telephone on 11/11/2012 at 4:00 p.m. to Dr. Roseanne Reno, who verbally acknowledged these results.   Original Report Authenticated By: Malachy Moan, M.D.   No diagnosis found.  MDM  Code stroke called due to speech changes. Neuro at bedside who state they believe is having seizures. Recommend Keppra, ativan and BP control. Admit to medicine for further workup.   Discussed with [facility]. Will admit to stepdown bed.   Loren Racer, MD 11/11/12 1640

## 2012-11-11 NOTE — H&P (Addendum)
[facility] History and Physical  Benjamin Park IEP:[id number] DOB: December 08, 1940 DOA: 11/11/2012  Referring physician:Dr Ranae Palms PCP: Lorenda Peck, MD   Chief Complaint:  Sent from PCP office for elevated BP and AMS  HX obtained from ED notes and patient's wife.  HPI:  72 year old male with a history of labile hypertension with episodes of confusions, history of TIA in 2011, history of subarachnoid hemorrhage in 2011, hypothyroidism, obstructive sleep apnea, CAD, history of MI in 1993 who went to see his PCP for an annual physical when he was found to be quite confused. He was sent to the ED. On arrival to the ED he was quite confused and unable to follow any commands. His blood pressure was markedly elevated to 160/120 mmhg and then further to 203/131 mmHg. A code stroke was called. Patient had a head CT done which was negative except for old vascular infarcts. He was given 2 mg of IV labetalol and 1 mg of IV Ativan. Neurology was consulted who evaluated the patient in the ED and given his recurring symptoms since  2003 with episodic confusions recommended to rule out for acute seizures. This and given a bolus of IV Keppra 1000 mg in the ED. As per neurology recommendation he is being admitted to rule out stroke and seizure. [facility] consulted for admission.  As per wife patient has had these symptoms since 2003 with multiple episodes of confusion associated with headache and markedly elevated blood pressure. Patient takes labetalol and Benicar for his labile blood pressure and okays that he takes clonidine which helps to reduce his blood pressure. However at times it does reduce down below 100 systolic rapidly. Wife reports that his confusion resolves after  blood pressure is controlled.  He had a closure of his foramen ovale 4-5  years ago for this symptoms which seemed to stabilize his blood pressure for 2 years however became worse again. On evaluation, patient is  sleepy but arousable and gets restless trying to pull out lines and leads. He is poorly communicative and incoherent.  As per wife he was completely normal when he went to his PCP office. He denied any chest pain, palpitations, blurred vision, nausea, vomiting, shortness of breath, abdominal pain, bowel or urinary symptoms. No witnessed seizure-like activity. No jerky movements or bowel/urinary incontinence noted.   Review of Systems:  As outlined in history of present illness. Review of systems limited due to patient's confusion.   Past Medical History  Diagnosis Date  . Hypertension   . Hx of transient ischemic attack (TIA)   . Hyperlipidemia   . Coronary artery disease   . Throat cancer     radiation therapy  . Hypertensive encephalopathy   . ED (erectile dysfunction)   . Personal history of kidney stones   . Subarachnoid hemorrhage   . Sleep apnea     on CPAP  . Stroke   . Myocardial infarction     12/1991  . Headache(784.0)   . Dysphagia   . Hypokalemia   . AKI (acute kidney injury) 02/23/2011  . Hypothyroidism   . Unspecified cerebral artery occlusion with cerebral infarction 11/08/2012   Past Surgical History  Procedure Laterality Date  . Angioplasty  1993    LAD  . Cardiac catheterization    . Coronary angioplasty    . Basal cell cancer      head  . Hernia repair    . Appendectomy    . Patent foramen ovale closure  2008  .  Eye surgery    . Colonoscopy N/A 06/18/2012    Procedure: COLONOSCOPY;  Surgeon: Petra Kuba, MD;  Location: [facility] ENDOSCOPY;  Service: Endoscopy;  Laterality: N/A;  h&p in file-[facility]   . Hot hemostasis N/A 06/18/2012    Procedure: HOT HEMOSTASIS (ARGON PLASMA COAGULATION/BICAP);  Surgeon: Petra Kuba, MD;  Location: [facility] ENDOSCOPY;  Service: Endoscopy;  Laterality: N/A;   Social History:  reports that he quit smoking about 22 years ago. His smoking use included Cigarettes. He smoked 0.00 packs per day. He has never used smokeless tobacco. He reports  that he does not drink alcohol or use illicit drugs.  No Known Allergies  Family History  Problem Relation Age of Onset  . Heart failure Father   . Heart attack Brother   . Anemia Brother 42    mi    Prior to Admission medications   Medication Sig Start Date End Date Taking? Authorizing Provider  aspirin 325 MG tablet Take 325 mg by mouth daily.     Yes Historical Provider, MD  chlorthalidone (HYGROTON) 25 MG tablet Take 25 mg by mouth daily.   Yes Historical Provider, MD  cloNIDine (CATAPRES) 0.2 MG tablet Take 0.1 mg by mouth 2 (two) times daily as needed (for blood pressure greater than 160/100).   Yes Historical Provider, MD  fish oil-omega-3 fatty acids 1000 MG capsule Take 1 g by mouth daily.    Yes Historical Provider, MD  GLUCOSAMINE PO Take 1 tablet by mouth daily.    Yes Historical Provider, MD  labetalol (NORMODYNE) 100 MG tablet Take 100 mg by mouth 2 (two) times daily.     Yes Historical Provider, MD  levothyroxine (SYNTHROID, LEVOTHROID) 100 MCG tablet Take 100 mcg by mouth daily.     Yes Historical Provider, MD  lovastatin (MEVACOR) 40 MG tablet Take 80 mg by mouth at bedtime.    Yes Historical Provider, MD  Multiple Vitamin (MULITIVITAMIN WITH MINERALS) TABS Take 1 tablet by mouth daily.     Yes Historical Provider, MD  olmesartan (BENICAR) 20 MG tablet Take 20 mg by mouth at bedtime.   Yes Historical Provider, MD  testosterone cypionate (DEPOTESTOTERONE CYPIONATE) 200 MG/ML injection Inject 200 mg into the muscle every 28 (twenty-eight) days.  07/08/12  Yes Historical Provider, MD  topiramate (TOPAMAX) 25 MG tablet Take 25-50 mg by mouth at bedtime. Take 1 tablet for 1 week starting 11/08/12 then increase to 2 tablets every night   Yes Historical Provider, MD    Physical Exam:  Filed Vitals:   11/11/12 1529 11/11/12 1600 11/11/12 1615  BP: 165/120 203/131   Pulse: 91 93 77  Temp: 97.7 F (36.5 C)    TempSrc: Oral    Resp: 18 13 15   SpO2: 100% 100% 97%     Constitutional: Vital signs reviewed. Elderly male lying in bed arousable to commands but poorly responding with Garbled speech . HEENT: Pupils reactive equally bilaterally, no pallor, moist oral mucosa Pulmonary/Chest: CTAB, no wheezes, rales, or rhonchi CVS: Normal S1 and S2, no murmurs rub or gallop Abdominal: Soft. Non-tender, non-distended, bowel sounds are normal, no masses, organomegaly, or guarding present.   extremities: Warm, no edema CNS: AAO x0, poorly responding to commands, very confused with slurred speech and restless moving all extremities,  Neurological exam limited due to patient''s confusion.  Labs on Admission:  Basic Metabolic Panel:  Recent Labs Lab 11/11/12 1546 11/11/12 1556  NA 141 143  K 3.4* 3.5  CL 105 105  CO2 27  --   GLUCOSE 119* 123*  BUN 23 23  CREATININE 1.17 1.30  CALCIUM 9.4  --    Liver Function Tests:  Recent Labs Lab 11/11/12 1546  AST 18  ALT 15  ALKPHOS 68  BILITOT 0.6  PROT 7.3  ALBUMIN 3.9   No results found for this basename: LIPASE, AMYLASE,  in the last 168 hours No results found for this basename: AMMONIA,  in the last 168 hours CBC:  Recent Labs Lab 11/11/12 1546 11/11/12 1556  WBC 7.2  --   NEUTROABS 5.6  --   HGB 17.0 16.7  HCT 46.2 49.0  MCV 93.5  --   PLT 170  --    Cardiac Enzymes:  Recent Labs Lab 11/11/12 1546  TROPONINI <0.30   BNP: No components found with this basename: POCBNP,  CBG:  Recent Labs Lab 11/11/12 1600  GLUCAP 123*    Radiological Exams on Admission: Ct Head Wo Contrast  11/11/2012   *RADIOLOGY REPORT*  Clinical Data: Severe headache, confusion and slurred speech  CT HEAD WITHOUT CONTRAST  Technique:  Contiguous axial images were obtained from the base of the skull through the vertex without contrast.  Comparison: Most recent prior head CT 04/09/2012  Findings: No acute intracranial hemorrhage, acute infarction, mass lesion, mass effect, midline shift or hydrocephalus.   Gray-white differentiation is preserved throughout.  Stable appearance of advanced cerebral and cerebellar volume loss and mild - moderate chronic microvascular ischemic white matter changes.  No focal soft tissue abnormality or calvarial abnormality.  Globes and orbits are intact and unremarkable.  Debris layer within the left maxillary sinus.  Mastoid air cells are normally aerated.  IMPRESSION:  1.  No acute intracranial abnormality. 2.  Stable age advanced atrophy and mild chronic ischemic microvascular white matter disease. 3.  Left maxillary paranasal sinus disease.  These results were called by telephone on 11/11/2012 at 4:00 p.m. to Dr. Roseanne Reno, who verbally acknowledged these results.   Original Report Authenticated By: Malachy Moan, M.D.    EKG:NSR no ST- changes  Assessment/Plan Principal Problem:   Hypertensive encephalopathy Admit to step down for closer monitoring Neuro checks  and seizure precautions. Has history of TIA and subarachnoid hemorrhage likely secondary to this. When necessary hydralazine and labetalol with parameters to improve elevated BP. Needs close monitoring to avoid rapid drop in BP.  Plan on ruling out CVA and seizures. Head CT negative for acute findings. MRI brain, 2D echo and carotid doppler ordered.swallow eval, PT/ OT. Check lipid panel and hemoglobin A1c -asa 300 mg suppository. -Keep n.p.o. for now until confusion resolves and cleared by swallow eval. -Patient given a bolus of IV Keppra in the ED. Started on Keppra 500 mg twice a day. Ordered for EEG. Prn ativan for seizures.    Active problem Labile hypertension As outlined above. Follows with [name] for his hypertension and CAD.  Coronary artery disease History of MI in 8. Had PTCA in 2003 and a stress test in 2007 On pravastatin and ASA      Sleep apnea On CPAP at bedtime which we'll continue once his mentation is better.  hypothyroidism  continue synthroid. Check TSH  Chronic  aspirations  as seen on previous swallow eval . uses thick it at home. will get swallow eval  DVT prophylaxis: sq lovenox  Code Status: full code Family Communication:wife at bedside Disposition Plan: home once improved  Eddie North [facility] Pager [phone]  If 7PM-7AM, please contact night-coverage  [email or web address] Password [email or web address] 11/11/2012, 5:24 PM   Total time spent: 70 minutes

## 2012-11-11 NOTE — Progress Notes (Signed)
At 1900 hrs, Dr. Windy Canny of [facility], reported pt to this NP stating he was in the ED on a Cardene gtt and being transferred to ICU/SDU waiting on bed placement. This NP spoke to ED nurse who said pt had vomited now and BP still high. This NP was in call room and Dr. Herma Carson of [facility] overheard me say "cardene" and asked about the pt.    Dr. Herma Carson and this NP to bedside in ED. Orders given for Hydralazine by Dr. Herma Carson.      Pt moans to tactile stimulation only. Pt vomited bloody emesis and it was decided by Dr.Z to intubate pt secondary to vomitus and concern over pt not being able to protect his airway due to unresponsiveness. Pt intubated in ED and Dr. Herma Carson took over pt's care. Wife aware of plan and gave permission for intubation.  Jimmye Norman, NP [facility]

## 2012-11-11 NOTE — Procedures (Signed)
Name: [name] MRN: [id number] DOB: April 08, 1940   PROCEDURE NOTE  Procedure:  Endotracheal intubation.  Indication:  Acute respiratory failure  Consent:  Consent was implied due to the emergency nature of the procedure.  Anesthesia:  A total of 10 mg of Etomidate was given intravenously.  Procedure summary:  Appropriate equipment was assembled. The patient was identified as Benjamin Park and safety timeout was performed. The patient was placed supine, with head in sniffing position. After adequate level of anesthesia was achieved, a Mac 3 blade was inserted into the oropharynx and the vocal cords were visualized. A 8.0 endotracheal tube was inserted without difficulty and visualized going through the vocal cords. The stylette was removed and cuff inflated. Colorimetric change was noted on the CO2 meter. Breath sounds were heard over both lung fields equally. ETT was secured at 23 cm lip line.  Post procedure chest xray was ordered.  Complications:  No immediate complications were noted.  Hemodynamic parameters and oxygenation remained stable throughout the procedure.    Orlean Bradford, M.D. Pulmonary and Critical Care Medicine [facility] Pager: [phone]  11/11/2012, 8:17 PM

## 2012-11-12 ENCOUNTER — Inpatient Hospital Stay (HOSPITAL_COMMUNITY): Payer: Medicare Other

## 2012-11-12 LAB — CBC
MCHC: 35 g/dL (ref 30.0–36.0)
Platelets: 171 10*3/uL (ref 150–400)
RDW: 13 % (ref 11.5–15.5)
WBC: 14.2 10*3/uL — ABNORMAL HIGH (ref 4.0–10.5)

## 2012-11-12 LAB — URINALYSIS, ROUTINE W REFLEX MICROSCOPIC
Glucose, UA: 100 mg/dL — AB
Leukocytes, UA: NEGATIVE
Nitrite: NEGATIVE
Specific Gravity, Urine: 1.014 (ref 1.005–1.030)
pH: 8 (ref 5.0–8.0)

## 2012-11-12 LAB — CBC WITH DIFFERENTIAL/PLATELET
Eosinophils Absolute: 0 10*3/uL (ref 0.0–0.7)
Hemoglobin: 16 g/dL (ref 13.0–17.0)
Lymphocytes Relative: 5 % — ABNORMAL LOW (ref 12–46)
Lymphs Abs: 0.7 10*3/uL (ref 0.7–4.0)
Monocytes Relative: 9 % (ref 3–12)
Neutro Abs: 12.5 10*3/uL — ABNORMAL HIGH (ref 1.7–7.7)
Neutrophils Relative %: 87 % — ABNORMAL HIGH (ref 43–77)
Platelets: 165 10*3/uL (ref 150–400)
RBC: 4.68 MIL/uL (ref 4.22–5.81)
WBC: 14.5 10*3/uL — ABNORMAL HIGH (ref 4.0–10.5)

## 2012-11-12 LAB — BASIC METABOLIC PANEL
BUN: 26 mg/dL — ABNORMAL HIGH (ref 6–23)
Calcium: 8.8 mg/dL (ref 8.4–10.5)
Creatinine, Ser: 1.24 mg/dL (ref 0.50–1.35)
GFR calc Af Amer: 66 mL/min — ABNORMAL LOW (ref >90)

## 2012-11-12 LAB — HEMOGLOBIN A1C: Hgb A1c MFr Bld: 5.5 % (ref <5.7)

## 2012-11-12 LAB — LIPID PANEL
HDL: 35 mg/dL — ABNORMAL LOW (ref >39)
Total CHOL/HDL Ratio: 4.4 RATIO
Triglycerides: 104 mg/dL (ref <150)

## 2012-11-12 LAB — LACTIC ACID, PLASMA: Lactic Acid, Venous: 1.2 mmol/L (ref 0.5–2.2)

## 2012-11-12 LAB — URINE MICROSCOPIC-ADD ON

## 2012-11-12 LAB — CORTISOL: Cortisol, Plasma: 21.3 ug/dL

## 2012-11-12 MED ORDER — SODIUM CHLORIDE 0.9 % IV BOLUS (SEPSIS)
1000.0000 mL | Freq: Once | INTRAVENOUS | Status: AC
  Administered 2012-11-12: 1000 mL via INTRAVENOUS

## 2012-11-12 MED ORDER — POTASSIUM CHLORIDE 20 MEQ/15ML (10%) PO LIQD
ORAL | Status: AC
  Filled 2012-11-12: qty 15

## 2012-11-12 MED ORDER — NOREPINEPHRINE BITARTRATE 1 MG/ML IJ SOLN
2.0000 ug/min | INTRAVENOUS | Status: DC
  Administered 2012-11-12: 20 ug/min via INTRAVENOUS
  Filled 2012-11-12: qty 16

## 2012-11-12 MED ORDER — VANCOMYCIN HCL IN DEXTROSE 1-5 GM/200ML-% IV SOLN
1000.0000 mg | Freq: Once | INTRAVENOUS | Status: AC
  Administered 2012-11-12: 1000 mg via INTRAVENOUS
  Filled 2012-11-12: qty 200

## 2012-11-12 MED ORDER — PIPERACILLIN-TAZOBACTAM 3.375 G IVPB
3.3750 g | Freq: Three times a day (TID) | INTRAVENOUS | Status: DC
  Administered 2012-11-12 – 2012-11-14 (×7): 3.375 g via INTRAVENOUS
  Filled 2012-11-12 (×9): qty 50

## 2012-11-12 MED ORDER — LEVOTHYROXINE SODIUM 100 MCG PO TABS
100.0000 ug | ORAL_TABLET | Freq: Every day | ORAL | Status: DC
  Administered 2012-11-13: 100 ug
  Filled 2012-11-12 (×2): qty 1

## 2012-11-12 MED ORDER — INSULIN ASPART 100 UNIT/ML ~~LOC~~ SOLN
0.0000 [IU] | SUBCUTANEOUS | Status: DC
  Administered 2012-11-12: 2 [IU] via SUBCUTANEOUS
  Administered 2012-11-12: 3 [IU] via SUBCUTANEOUS

## 2012-11-12 MED ORDER — PHENYLEPHRINE HCL 10 MG/ML IJ SOLN
30.0000 ug/min | INTRAVENOUS | Status: DC
  Administered 2012-11-12: 50 ug/min via INTRAVENOUS
  Filled 2012-11-12: qty 1

## 2012-11-12 MED ORDER — CHLORHEXIDINE GLUCONATE 0.12 % MT SOLN
15.0000 mL | Freq: Two times a day (BID) | OROMUCOSAL | Status: DC
  Administered 2012-11-12 – 2012-11-14 (×5): 15 mL via OROMUCOSAL
  Filled 2012-11-12 (×7): qty 15

## 2012-11-12 MED ORDER — ACETAMINOPHEN 160 MG/5ML PO SOLN
650.0000 mg | Freq: Four times a day (QID) | ORAL | Status: DC | PRN
  Administered 2012-11-17: 650 mg
  Filled 2012-11-12: qty 20.3

## 2012-11-12 MED ORDER — POTASSIUM CHLORIDE 20 MEQ/15ML (10%) PO LIQD
20.0000 meq | ORAL | Status: AC
  Administered 2012-11-12 (×2): 20 meq
  Filled 2012-11-12: qty 15

## 2012-11-12 MED ORDER — PROPOFOL 10 MG/ML IV EMUL
5.0000 ug/kg/min | INTRAVENOUS | Status: DC
  Administered 2012-11-13: 15 ug/kg/min via INTRAVENOUS
  Filled 2012-11-12 (×2): qty 100

## 2012-11-12 MED ORDER — VANCOMYCIN HCL IN DEXTROSE 750-5 MG/150ML-% IV SOLN
750.0000 mg | Freq: Two times a day (BID) | INTRAVENOUS | Status: DC
  Administered 2012-11-12 – 2012-11-13 (×3): 750 mg via INTRAVENOUS
  Filled 2012-11-12 (×4): qty 150

## 2012-11-12 MED ORDER — NOREPINEPHRINE BITARTRATE 1 MG/ML IJ SOLN: 2.0000 ug/min | INTRAVENOUS | Status: DC

## 2012-11-12 NOTE — Progress Notes (Addendum)
NEURO HOSPITALIST PROGRESS NOTE   SUBJECTIVE:                                                                                                                        No complaints, intubated and breathing over the vent. No further seizure activity noted while on Keppra. Patient did have a period of hypotension between 0900 and 1100 hours.   OBJECTIVE:                                                                                                                           Vital signs in last 24 hours: Temp:  [99.3 F (37.4 C)-101.2 F (38.4 C)] 99.3 F (37.4 C) (08/26 1215) Pulse Rate:  [64-116] 66 (08/26 1415) Resp:  [12-25] 12 (08/26 1415) BP: (59-214)/(36-131) 99/63 mmHg (08/26 1415) SpO2:  [80 %-100 %] 100 % (08/26 1415) Arterial Line BP: (54-247)/(34-121) 103/57 mmHg (08/26 1415) FiO2 (%):  [40 %] 40 % (08/26 1211) Weight:  [78.5 kg (173 lb 1 oz)-83.915 kg (185 lb)] 78.5 kg (173 lb 1 oz) (08/26 0500)  Intake/Output from previous day: 08/25 0701 - 08/26 0700 In: 2862.6 [I.V.:757.6; IV Piggyback:2105] Out: 650 [Urine:650] Intake/Output this shift: Total I/O In: 846.3 [I.V.:528.8; NG/GT:150; IV Piggyback:167.5] Out: 1130 [Urine:730; Emesis/NG output:400] Nutritional status: NPO  Past Medical History  Diagnosis Date  . Hypertension   . Hx of transient ischemic attack (TIA)   . Hyperlipidemia   . Coronary artery disease   . Throat cancer     radiation therapy  . Hypertensive encephalopathy   . ED (erectile dysfunction)   . Personal history of kidney stones   . Subarachnoid hemorrhage   . Sleep apnea     on CPAP  . Stroke   . Myocardial infarction     12/1991  . Headache(784.0)   . Dysphagia   . Hypokalemia   . AKI (acute kidney injury) 02/23/2011  . Hypothyroidism   . Unspecified cerebral artery occlusion with cerebral infarction 11/08/2012      Neurologic Exam:   Mental Status: Intubated breathing over vent, looks toward  my voice, does not follow my commands.  Cranial Nerves: II:  Blinks to threat bilaterally and closes his eyes tightly when light used  to check pupils, pupils equal, round, reactive to light and accommodation III,IV, VI: ptosis not present, extra-ocular motions intact --looking left and right V,VII: sface symmetric, facial light touch sensation normal bilaterally VIII: looks toward voice IX,X: gag reflex present Motor: Withdraws to noxious stimuli in bilateral UE 4/5 and bilateral LE 5/5.   Tone and bulk:normal tone throughout; no atrophy noted Sensory: withdraws to pain bilaterally Deep Tendon Reflexes:  Right: Upper Extremity   Left: Upper extremity   biceps (C-5 to C-6) 2/4   biceps (C-5 to C-6) 2/4 tricep (C7) 2/4    triceps (C7) 2/4 Brachioradialis (C6) 2/4  Brachioradialis (C6) 2/4  Lower Extremity Lower Extremity  quadriceps (L-2 to L-4) 2/4   quadriceps (L-2 to L-4) 2/4 Achilles (S1) 2/4   Achilles (S1) 2/4  Plantars: Right: downgoing   Left: downgoing CV: pulses palpable throughout   Lab Results: Lab Results  Component Value Date/Time   CHOL 155 11/12/2012  4:10 AM   Lipid Panel  Recent Labs  11/12/12 0410  CHOL 155  TRIG 104  HDL 35*  CHOLHDL 4.4  VLDL 21  LDLCALC 99    Studies/Results: Ct Head Wo Contrast  11/11/2012   *RADIOLOGY REPORT*  Clinical Data: Severe headache, confusion and slurred speech  CT HEAD WITHOUT CONTRAST  Technique:  Contiguous axial images were obtained from the base of the skull through the vertex without contrast.  Comparison: Most recent prior head CT 04/09/2012  Findings: No acute intracranial hemorrhage, acute infarction, mass lesion, mass effect, midline shift or hydrocephalus.  Gray-white differentiation is preserved throughout.  Stable appearance of advanced cerebral and cerebellar volume loss and mild - moderate chronic microvascular ischemic white matter changes.  No focal soft tissue abnormality or calvarial abnormality.  Globes  and orbits are intact and unremarkable.  Debris layer within the left maxillary sinus.  Mastoid air cells are normally aerated.  IMPRESSION:  1.  No acute intracranial abnormality. 2.  Stable age advanced atrophy and mild chronic ischemic microvascular white matter disease. 3.  Left maxillary paranasal sinus disease.  These results were called by telephone on 11/11/2012 at 4:00 p.m. to Dr. Roseanne Reno, who verbally acknowledged these results.   Original Report Authenticated By: Malachy Moan, M.D.   Dg Chest Port 1 View  11/12/2012   *RADIOLOGY REPORT*  Clinical Data: Right CVL placement  PORTABLE CHEST - 1 VIEW  Comparison: 11/12/2012 at 0733 hours  Findings: Endotracheal tube terminates 5.5 cm above the carina.  Enteric tube courses below the diaphragm.  Interval placement of a right IJ venous catheter which terminates at the cavoatrial junction.  Mild left basilar opacity, likely reflecting a trace left pleural effusion with associated atelectasis.  No pneumothorax.  The heart is normal in size.  Atrial closure device.  Additional line/leads overlying the heart.  IMPRESSION: Interval placement of a right IJ venous catheter which terminates at the cavoatrial junction.  No pneumothorax.   Original Report Authenticated By: Charline Bills, M.D.   Dg Chest Port 1 View  11/12/2012   *RADIOLOGY REPORT*  Clinical Data: To evaluate ET tube position, lung fields.  PORTABLE CHEST - 1 VIEW  Comparison: 11/11/2012  Findings: Endotracheal tube and NG tube are unchanged.  Heart is upper limits normal in size.  Slight increasing interstitial prominence within the lungs, right greater than left.  Question interstitial edema.  No confluent opacities.  No effusions or acute bony abnormality.  IMPRESSION: Slight increasing interstitial prominence, right slightly greater than left.  Question early interstitial  edema.   Original Report Authenticated By: Charlett Nose, M.D.   Dg Chest Port 1 View  11/11/2012   CLINICAL DATA:   Post intubation.  EXAM: PORTABLE CHEST - 1 VIEW  COMPARISON:  04/09/2012  FINDINGS: Endotracheal tube is 4.6 cm above the chronic. NG tube enters the stomach. Lungs are clear. Heart is normal size. No effusions.  IMPRESSION: Endotracheal tube 4.6 cm above the chronic. No active disease.   Electronically Signed   By: Charlett Nose   On: 11/11/2012 20:25   EEG: pattern of generalized slowing consistent with normal sleep. No evidence of epileptic activity   MEDICATIONS                                                                                                                        Scheduled: . antiseptic oral rinse  1 application Mouth Rinse QID  . aspirin  325 mg Oral Daily  . chlorhexidine  15 mL Mouth/Throat BID  . insulin aspart  0-15 Units Subcutaneous Q4H  . levETIRAcetam  500 mg Intravenous Q12H  . [START ON 11/13/2012] levothyroxine  100 mcg Per Tube QAC breakfast  . pantoprazole (PROTONIX) IV  40 mg Intravenous Q12H  . piperacillin-tazobactam (ZOSYN)  IV  3.375 g Intravenous Q8H  . sodium chloride  3 mL Intravenous Q12H  . vancomycin  750 mg Intravenous Q12H    ASSESSMENT/PLAN:                                                                                                             72 y.o. male presenting to ED with AMS and receptive aphasia. 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 During admission patient had emesis and was intubated due to possible airway compromise. Since patient has been on Keppra no seizure activity noted.  EEG showed no epileptiform activity. Of consideration as well, although less likely, symptoms may be manifestations of hypertensive encephalopathy. MRI brain to evaluate for signs of hypertensive encephalopathy such as PRES, as well as to rule out low likelihood of acute stroke pending.    Recommend: 1) Continue Keppra 500 mg BID IV 2) Will follow MRI--patient in transit now.      Assessment and plan discussed with with attending physician and they are in agreement.    Felicie Morn PA-C [facility] [phone]  11/12/2012, 3:36 PM

## 2012-11-12 NOTE — Procedures (Signed)
L [place] CVL attempted - unsuccessfull. L IJ CVL attempted under US guidance x 2.  Both times can visualize the vein well, cannulated without difficulty, blood drawn back but cannot pass wire.  Attempts abandoned.  Now normotensive without need for vasopressors.  CXR pending.

## 2012-11-12 NOTE — Progress Notes (Signed)
eLink Physician-Brief Progress Note Patient Name: Benjamin Park DOB: 1941/03/20 MRN: [id number]  Date of Service  11/12/2012   HPI/Events of Note  Through the night patient with gradual decrease in BP now 77/44 verified by aline as patient making urine.     eICU Interventions  Plan: 1 liter NS bolus for BP support   Intervention Category Intermediate Interventions: Hypotension - evaluation and management  Ailany Koren 11/12/2012, 6:11 AM

## 2012-11-12 NOTE — Procedures (Signed)
Arterial Catheter Insertion Procedure Note Benjamin Park [id number] May 03, 1940  Procedure: Insertion of Arterial Catheter  Indications: Blood pressure monitoring and Frequent blood sampling  Procedure Details Consent: Unable to obtain consent because of emergent medical necessity. Time Out: Verified patient identification, verified procedure, site/side was marked, verified correct patient position, special equipment/implants available, medications/allergies/relevent history reviewed, required imaging and test results available.  Performed  Maximum sterile technique was used including antiseptics, cap, gloves, gown, hand hygiene, mask and sheet. Skin prep: Chlorhexidine; local anesthetic administered 20 gauge catheter was inserted into left radial artery using the Seldinger technique.  Evaluation Blood flow good; BP tracing good. Complications: No apparent complications.   Malachi Paradise 11/12/2012

## 2012-11-12 NOTE — Care Management Note (Addendum)
    Page 1 of 1   11/14/2012     10:06:03 AM   CARE MANAGEMENT NOTE 11/14/2012  Patient:  Benjamin Park, Benjamin Park   Account Number:  [id number]  Date Initiated:  11/12/2012  Documentation initiated by:  Junius Creamer  Subjective/Objective Assessment:   adm w htn encepalopathy,vent     Action/Plan:   lives w wife, pcp dr Zenaida Deed   Anticipated DC Date:  11/14/2012   Anticipated DC Plan:  IP REHAB FACILITY      DC Planning Services  CM consult      Choice offered to / List presented to:             Status of service:  In process, will continue to follow Medicare Important Message given?   (If response is "NO", the following Medicare IM given date fields will be blank) Date Medicare IM given:   Date Additional Medicare IM given:    Discharge Disposition:    Per UR Regulation:  Reviewed for med. necessity/level of care/duration of stay  If discussed at Long Length of Stay Meetings, dates discussed:    Comments:  ContactDeondrae, [place] [phone]   [phone]  11-14-12 10am Avie Arenas, RNBSN [phone] Talked with wife at bedside.  Patient down for swallowing test. 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 PT/OT ordered - ?? CIR candidate. CM will continue to follow for discharge needs.

## 2012-11-12 NOTE — Procedures (Signed)
Central Venous Catheter Insertion Procedure Note YUL DIANA [id number] 06/19/1940  Procedure: Insertion of Central Venous Catheter Indications: Assessment of intravascular volume, Drug and/or fluid administration and Frequent blood sampling  Procedure Details Consent: Risks of procedure as well as the alternatives and risks of each were explained to the (patient/caregiver).  Consent for procedure obtained. Time Out: Verified patient identification, verified procedure, site/side was marked, verified correct patient position, special equipment/implants available, medications/allergies/relevent history reviewed, required imaging and test results available.  Performed Real time UD used to ID and cannulate the vessel Was difficult to pass Guidewire. Had to access more proximal to clavicle.  Maximum sterile technique was used including antiseptics, cap, gloves, gown, hand hygiene, mask and sheet. Skin prep: Chlorhexidine; local anesthetic administered A antimicrobial bonded/coated triple lumen catheter was placed in the right internal jugular vein using the Seldinger technique.  Evaluation Blood flow good Complications: No apparent complications Patient did tolerate procedure well. Chest X-ray ordered to verify placement.  CXR: pending.  BABCOCK,PETE 11/12/2012, 11:31 AM  I was present for procedure.  Coralyn Helling, MD [facility] Pulmonary/Critical Care 11/12/2012, 12:01 PM Pager:  [phone] After 3pm call: [phone]

## 2012-11-12 NOTE — Progress Notes (Signed)
*  PRELIMINARY RESULTS* Vascular Ultrasound Carotid Duplex (Doppler) has been completed.  Preliminary findings: Bilateral:  1-39% ICA stenosis.  Bilateral moderate ECA stenosis. Vertebral artery flow is antegrade.      Farrel Demark, RDMS, RVT  11/12/2012, 10:21 AM

## 2012-11-12 NOTE — Clinical Documentation Improvement (Signed)
THIS DOCUMENT IS NOT A PERMANENT PART OF THE MEDICAL RECORD  Please update your documentation with the medical record to reflect your response to this query. If you need help knowing how to do this please call [phone].  11/12/12   Dear Dr. Craige Cotta,  In a better effort to capture your patient's severity of illness, reflect appropriate length of stay and utilization of resources, a review of the patient medical record has revealed the following indicators.    Based on your clinical judgment, please clarify and document in a progress note and/or discharge summary the clinical condition associated with the following supporting information: In the Progress Note from 11/12/12, you documented "CKD" in the Assessment/Plan section.    In responding to this query please exercise your independent judgment.  The fact that a query is asked, does not imply that any particular answer is desired or expected.  Possible Clinical Conditions?   _______CKD Stage I -  GFR > OR = 90 _______CKD Stage II - GFR 60-80 _______CKD Stage III - GFR 30-59 _______CKD Stage IV - GFR 15-29 _______CKD Stage V - GFR < 15 _______ESRD (End Stage Renal Disease) _______Cannot Clinically determine    Lab:  Creatinine level (baseline and current): 11/11/12 Creatinine  1.17 mg/dL 0/9/81  Creatinine  1.91 mg/dL 06/24/80  Creatinine  9.56 mg/dL 05/02/06 Creatinine  6.57 mg/dL  Calculated GFR:  8/46/96 GFR   61 mL/min   03/21/11  GFR   54 mL/min 09/23/10  GFR  >60 mL/min 05/11/09 GFR  >60 mL/min     You may use possible, probable, or suspect with inpatient documentation.  Possible, probable, suspected diagnoses MUST be documented at the time of discharge.  Reviewed: additional documentation in the medical record  Thank You,  Darla Lesches, RN, BSN, CCRN Clinical Documentation Improvement Specialist Office [phone] HIM department--[facility]

## 2012-11-12 NOTE — Procedures (Signed)
ELECTROENCEPHALOGRAM REPORT   Patient: Benjamin Park       Room #: 2N56 EEG No. [id number] Age: 72 y.o.        Sex: male Referring Physician: Zubelevitskiy Report Date:  11/12/2012        Interpreting Physician: Aline Brochure  History: Benjamin Park is an 72 y.o. male history of recurrent spells of confusion with speech output difficulty associated with elevated blood pressure. Spells began in 2003 and follow a typical clinical course. Patient has no memory for events during the spells. He is currently intubated for airway protection. There is a history of subarachnoid hemorrhage 2001.  Indications for study:  Rule out focal seizure disorder.  Technique: This is an 18 channel routine scalp EEG performed at the bedside with bipolar and monopolar montages arranged in accordance to the international 10/20 system of electrode placement.   Description: EEG was recorded at patient's bedside in the intensive care unit. Patient was intubated and on mechanical ventilation. Patient was on no sedating medications at the time of this recording. Background activity consisted of low amplitude diffuse continuous mixed irregular delta and theta activity with symmetrical sleep spindles and arousal responses. Photic stimulation was not performed. No epileptiform discharges recorded. Muscle artifact involving the right temporal region was noted including single muscle discharges at times.  Interpretation: This EEG shows a pattern of generalized slowing consistent with normal sleep. No evidence of epileptic activity was recorded.   Venetia Maxon M.D. [facility] [phone]

## 2012-11-12 NOTE — Progress Notes (Signed)
Portable EEG completed

## 2012-11-12 NOTE — Progress Notes (Addendum)
PULMONARY  / CRITICAL CARE MEDICINE  Name: Benjamin Park MRN: [id number] DOB: 12/10/1940    ADMISSION DATE:  11/11/2012 CONSULTATION DATE:  11/11/2012  REFERRING MD :  [facility] PRIMARY SERVICE:  PCCM  CHIEF COMPLAINT:  Acute encephalopathy  BRIEF PATIENT DESCRIPTION: 72 yo with past medical history of TIA (2011) and SAH (2011) as well as intermittent episodes of encephalopathy associated with hypertension since 2003  brought to ED with altered mental status and hypertension.  In ED: developed emesis - intubated for airway protection.  CT revealed no acute findings.  Seizures were suspected and Keppra given.  PCCM was consulted.  SIGNIFICANT EVENTS: 8/25 Admit with TME, seizure, VDRF, difficulty placing Lt IJ CVL 8/26 Fever, hypotension  STUDIES:  8/25  Head CT >>> atrophy, chronic ischemic changes, Lt maxillary sinus disease  LINES / TUBES: ETT 8/25 >>> A line 8/26 >>> CVL 8/26 >>>  CULTURES: Sputum 8/26 >> Urine 8/26 >> Blood 8/26 >>  ANTIBIOTICS: Vancomycin 8/26 >> Zosyn 8/26 >>  INTERVAL HISTORY: Febrile and hypotension overnight.  VITAL SIGNS: Temp:  [97.7 F (36.5 C)-101.2 F (38.4 C)] 100.3 F (37.9 C) (08/26 0828) Pulse Rate:  [77-116] 82 (08/26 0745) Resp:  [12-25] 12 (08/26 0745) BP: (77-214)/(44-131) 99/63 mmHg (08/26 0700) SpO2:  [95 %-100 %] 100 % (08/26 0745) FiO2 (%):  [40 %] 40 % (08/26 0745) Weight:  [173 lb 1 oz (78.5 kg)-185 lb (83.915 kg)] 173 lb 1 oz (78.5 kg) (08/26 0500)  HEMODYNAMICS:   VENTILATOR SETTINGS: Vent Mode:  [-] PRVC FiO2 (%):  [40 %] 40 % Set Rate:  [12 bmp-15 bmp] 12 bmp Vt Set:  [500 mL-630 mL] 630 mL PEEP:  [5 cmH20] 5 cmH20 Plateau Pressure:  [14 cmH20-15 cmH20] 15 cmH20  INTAKE / OUTPUT: Intake/Output     08/25 0701 - 08/26 0700 08/26 0701 - 08/27 0700   I.V. (mL/kg) 757.6 (9.7)    IV Piggyback 2105 12.5   Total Intake(mL/kg) 2862.6 (36.5) 12.5 (0.2)   Urine (mL/kg/hr) 650    Total Output 650     Net +2212.6 +12.5          PHYSICAL EXAMINATION: General: No distress Neuro: Obtunded, pupils reactive HEENT: ETT in place Cardiovascular: regular Lungs: decreased breath sounds, no wheeze Abdomen:  Soft, nontender, bowel sounds diminished Musculoskeletal: no edema Skin: no rashes  LABS: CBC Recent Labs     11/11/12  1546  11/11/12  1556  11/12/12  0410  WBC  7.2   --   14.2*  HGB  17.0  16.7  15.7  HCT  46.2  49.0  44.8  PLT  170   --   171    Coag's Recent Labs     11/11/12  1546  APTT  36  INR  1.05    BMET Recent Labs     11/11/12  1546  11/11/12  1556  11/12/12  0410  NA  141  143  140  K  3.4*  3.5  3.2*  CL  105  105  104  CO2  27   --   24  BUN  23  23  26*  CREATININE  1.17  1.30  1.24  GLUCOSE  119*  123*  146*    Electrolytes Recent Labs     11/11/12  1546  11/12/12  0410  CALCIUM  9.4  8.8    Sepsis Markers No results found for this basename: LACTICACIDVEN, PROCALCITON, O2SATVEN,  in the last  72 hours  ABG Recent Labs     11/11/12  2008  PHART  7.374  PCO2ART  40.2  PO2ART  124.0*    Liver Enzymes Recent Labs     11/11/12  1546  AST  18  ALT  15  ALKPHOS  68  BILITOT  0.6  ALBUMIN  3.9    Cardiac Enzymes Recent Labs     11/11/12  1546  TROPONINI  <0.30    Glucose Recent Labs     11/11/12  1600  11/11/12  2040  11/11/12  2321  11/12/12  0815  GLUCAP  123*  149*  128*  109*    Imaging Ct Head Wo Contrast  11/11/2012   *RADIOLOGY REPORT*  Clinical Data: Severe headache, confusion and slurred speech  CT HEAD WITHOUT CONTRAST  Technique:  Contiguous axial images were obtained from the base of the skull through the vertex without contrast.  Comparison: Most recent prior head CT 04/09/2012  Findings: No acute intracranial hemorrhage, acute infarction, mass lesion, mass effect, midline shift or hydrocephalus.  Gray-white differentiation is preserved throughout.  Stable appearance of advanced cerebral and cerebellar volume loss and  mild - moderate chronic microvascular ischemic white matter changes.  No focal soft tissue abnormality or calvarial abnormality.  Globes and orbits are intact and unremarkable.  Debris layer within the left maxillary sinus.  Mastoid air cells are normally aerated.  IMPRESSION:  1.  No acute intracranial abnormality. 2.  Stable age advanced atrophy and mild chronic ischemic microvascular white matter disease. 3.  Left maxillary paranasal sinus disease.  These results were called by telephone on 11/11/2012 at 4:00 p.m. to Dr. Roseanne Reno, who verbally acknowledged these results.   Original Report Authenticated By: Malachy Moan, M.D.   Dg Chest Port 1 View  11/12/2012   *RADIOLOGY REPORT*  Clinical Data: To evaluate ET tube position, lung fields.  PORTABLE CHEST - 1 VIEW  Comparison: 11/11/2012  Findings: Endotracheal tube and NG tube are unchanged.  Heart is upper limits normal in size.  Slight increasing interstitial prominence within the lungs, right greater than left.  Question interstitial edema.  No confluent opacities.  No effusions or acute bony abnormality.  IMPRESSION: Slight increasing interstitial prominence, right slightly greater than left.  Question early interstitial edema.   Original Report Authenticated By: Charlett Nose, M.D.   Dg Chest Port 1 View  11/11/2012   CLINICAL DATA:  Post intubation.  EXAM: PORTABLE CHEST - 1 VIEW  COMPARISON:  04/09/2012  FINDINGS: Endotracheal tube is 4.6 cm above the chronic. NG tube enters the stomach. Lungs are clear. Heart is normal size. No effusions.  IMPRESSION: Endotracheal tube 4.6 cm above the chronic. No active disease.   Electronically Signed   By: Charlett Nose   On: 11/11/2012 20:25       ASSESSMENT / PLAN:  PULMONARY A: Acute respiratory failure secondary to mental status change. Hx of OSA. P:   -full vent support for now -f/u CXR  CARDIOVASCULAR A: HTN emergency on admission. Developed hypotension 8/26. Hx of hyperlipidemia. P:   -continue IV fluids -place CVL and monitor CVP  -add pressors -check lactic acid, cortisol, procalcitonin -hold zocor, lovaza for now  RENAL A: CKD 2. P:   -monitor renal fx, urine outpt, electrolytes  GASTROINTESTINAL A: Blood from mouth >> ? Related to tongue bite during seizure vs GI bleed. P:   -continue protonix BID -NPO for now  HEMATOLOGIC A: ?oral bleeding on admit >> not noted  since. P:  -SCD's for DVT prevention -f/u CBC  INFECTIOUS A: Fever/hypotension with concern for sepsis ? Source. P:   -f/u cultures -check procalcitonin -D1/x vancomycin/zosyn  ENDOCRINE  A:  Hx of hypothyroidism.  P:   -continue synthroid  NEUROLOGIC A: Acute encephalopathy with new onset seizure >> initial concern for hypertensive encephalopathy. Hx of recurrent TIA's, SAH. P:   -f/u EEG, MRI brain, carotid doppler -keppra per neurology -?if he needs LP -cotninue ASA per neurology  Updated wife at bedside about plan.  CC time 40 minutes.  Coralyn Helling, MD [facility] Pulmonary/Critical Care 11/12/2012, 11:18 AM Pager:  [phone] After 3pm call: [phone]

## 2012-11-12 NOTE — Progress Notes (Signed)
Respiratory specimen collected via ETT and sent to main lab.

## 2012-11-12 NOTE — Progress Notes (Signed)
eLink Nursing ICU Electrolyte Replacement Protocol  Patient Name: Benjamin Park DOB: 1940-07-27 MRN: [id number]  Date of Service  11/12/2012   HPI/Events of Note    Recent Labs Lab 11/11/12 1546 11/11/12 1556 11/12/12 0410  NA 141 143 140  K 3.4* 3.5 3.2*  CL 105 105 104  CO2 27  --  24  GLUCOSE 119* 123* 146*  BUN 23 23 26*  CREATININE 1.17 1.30 1.24  CALCIUM 9.4  --  8.8    The CrCl is unknown because both a height and weight (above a minimum accepted value) are required for this calculation.  Intake/Output     08/25 0701 - 08/26 0700   I.V. (mL/kg) 532.6 (6.8)   IV Piggyback 105   Total Intake(mL/kg) 637.6 (8.1)   Urine (mL/kg/hr) 650   Total Output 650   Net -12.4        - I/O DETAILED x24h    Total I/O In: 637.6 [I.V.:532.6; IV Piggyback:105] Out: 650 [Urine:650] - I/O THIS SHIFT    ASSESSMENT   eICURN Interventions  K+ 3.2 Electrolyte protocol criteria met. Electrolytes replaced per protocol. MD notified.   ASSESSMENT: MAJOR ELECTROLYTE    Merita Norton 11/12/2012, 5:32 AM

## 2012-11-12 NOTE — Progress Notes (Signed)
ANTIBIOTIC CONSULT NOTE - INITIAL  Pharmacy Consult for Vancomycin/Zosyn Indication: suspected pneumonia  No Known Allergies  Patient Measurements: Height: 6' 0.5" (184.2 cm) Weight: 173 lb 1 oz (78.5 kg) IBW/kg (Calculated) : 78.75   Vital Signs: Temp: 100.3 F (37.9 C) (08/26 0828) Temp src: Oral (08/26 0828) BP: 99/63 mmHg (08/26 0700) Pulse Rate: 82 (08/26 0745) Intake/Output from previous day: 08/25 0701 - 08/26 0700 In: 2862.6 [I.V.:757.6; IV Piggyback:2105] Out: 650 [Urine:650] Intake/Output from this shift: Total I/O In: 12.5 [IV Piggyback:12.5] Out: -   Labs:  Recent Labs  11/11/12 1546 11/11/12 1556 11/12/12 0410  WBC 7.2  --  14.2*  HGB 17.0 16.7 15.7  PLT 170  --  171  CREATININE 1.17 1.30 1.24   Estimated Creatinine Clearance: 60.7 ml/min (by C-G formula based on Cr of 1.24). No results found for this basename: VANCOTROUGH, Leodis Binet, VANCORANDOM, GENTTROUGH, GENTPEAK, GENTRANDOM, TOBRATROUGH, TOBRAPEAK, TOBRARND, AMIKACINPEAK, AMIKACINTROU, AMIKACIN,  in the last 72 hours   Microbiology: Recent Results (from the past 720 hour(s))  MRSA PCR SCREENING     Status: None   Collection Time    11/11/12  9:27 PM      Result Value Range Status   MRSA by PCR NEGATIVE  NEGATIVE Final   Comment:            The GeneXpert MRSA Assay (FDA     approved for NASAL specimens     only), is one component of a     comprehensive MRSA colonization     surveillance program. It is not     intended to diagnose MRSA     infection nor to guide or     monitor treatment for     MRSA infections.    Medical History: Past Medical History  Diagnosis Date  . Hypertension   . Hx of transient ischemic attack (TIA)   . Hyperlipidemia   . Coronary artery disease   . Throat cancer     radiation therapy  . Hypertensive encephalopathy   . ED (erectile dysfunction)   . Personal history of kidney stones   . Subarachnoid hemorrhage   . Sleep apnea     on CPAP  . Stroke    . Myocardial infarction     12/1991  . Headache(784.0)   . Dysphagia   . Hypokalemia   . AKI (acute kidney injury) 02/23/2011  . Hypothyroidism   . Unspecified cerebral artery occlusion with cerebral infarction 11/08/2012    Medications:  Scheduled:  . antiseptic oral rinse  1 application Mouth Rinse QID  . aspirin  325 mg Oral Daily  . chlorhexidine  15 mL Mouth/Throat BID  . levETIRAcetam  500 mg Intravenous Q12H  . levothyroxine  100 mcg Oral QAC breakfast  . omega-3 acid ethyl esters  1 g Oral Daily  . pantoprazole (PROTONIX) IV  40 mg Intravenous Q12H  . piperacillin-tazobactam (ZOSYN)  IV  3.375 g Intravenous Q8H  . potassium chloride  20 mEq Per Tube Q4H  . simvastatin  40 mg Oral q1800  . sodium chloride  3 mL Intravenous Q12H  . vancomycin  750 mg Intravenous Q12H   Assessment: 72 yo M brought to ED with AMS and hypertension who developed emesis and was intubated. Starting on vancomycin and zosyn for suspected pneumonia.   Goal of Therapy:  Vancomycin trough level 15-20 mcg/ml  Plan:  -Vancomycin 1g IV this morning, followed by 750 mg IV q12h.starting at 2200. -Zosyn 3.375g IV q 8  hours (4 hour infusion) -Will continue to monitor renal function, culture data, and clinical progression. -Consider checking vancomycin trough at steady state.   Hooper Petteway C. Yareth Kearse, PharmD Clinical Pharmacist-Resident Pager: [phone] Pharmacy: [phone] 11/12/2012 9:25 AM

## 2012-11-13 ENCOUNTER — Inpatient Hospital Stay (HOSPITAL_COMMUNITY): Payer: Medicare Other

## 2012-11-13 ENCOUNTER — Encounter (HOSPITAL_COMMUNITY): Payer: Self-pay | Admitting: Pulmonary Disease

## 2012-11-13 DIAGNOSIS — G4730 Sleep apnea, unspecified: Secondary | ICD-10-CM

## 2012-11-13 DIAGNOSIS — Z5189 Encounter for other specified aftercare: Secondary | ICD-10-CM

## 2012-11-13 LAB — BASIC METABOLIC PANEL
BUN: 17 mg/dL (ref 6–23)
CO2: 20 mEq/L (ref 19–32)
Calcium: 8.1 mg/dL — ABNORMAL LOW (ref 8.4–10.5)
Chloride: 116 mEq/L — ABNORMAL HIGH (ref 96–112)
Creatinine, Ser: 1.06 mg/dL (ref 0.50–1.35)
Glucose, Bld: 122 mg/dL — ABNORMAL HIGH (ref 70–99)

## 2012-11-13 LAB — URINE CULTURE
Colony Count: NO GROWTH
Culture: NO GROWTH

## 2012-11-13 LAB — CBC
HCT: 39 % (ref 39.0–52.0)
MCH: 32.2 pg (ref 26.0–34.0)
MCV: 93.8 fL (ref 78.0–100.0)
Platelets: 128 10*3/uL — ABNORMAL LOW (ref 150–400)
RDW: 13.3 % (ref 11.5–15.5)

## 2012-11-13 LAB — GLUCOSE, CAPILLARY
Glucose-Capillary: 115 mg/dL — ABNORMAL HIGH (ref 70–99)
Glucose-Capillary: 98 mg/dL (ref 70–99)
Glucose-Capillary: 88 mg/dL (ref 70–99)
Glucose-Capillary: 78 mg/dL (ref 70–99)

## 2012-11-13 LAB — POCT I-STAT 3, ART BLOOD GAS (G3+)
Bicarbonate: 18 mEq/L — ABNORMAL LOW (ref 20.0–24.0)
O2 Saturation: 99 %
TCO2: 19 mmol/L (ref 0–100)
pCO2 arterial: 29.6 mmHg — ABNORMAL LOW (ref 35.0–45.0)
pO2, Arterial: 123 mmHg — ABNORMAL HIGH (ref 80.0–100.0)

## 2012-11-13 MED ORDER — LABETALOL HCL 5 MG/ML IV SOLN
10.0000 mg | INTRAVENOUS | Status: DC | PRN
  Administered 2012-11-13 – 2012-11-14 (×3): 20 mg via INTRAVENOUS
  Administered 2012-11-15: 10 mg via INTRAVENOUS
  Filled 2012-11-13 (×4): qty 4

## 2012-11-13 MED ORDER — FENTANYL CITRATE 0.05 MG/ML IJ SOLN
12.5000 ug | INTRAMUSCULAR | Status: DC | PRN
  Filled 2012-11-13: qty 2

## 2012-11-13 MED ORDER — KCL IN DEXTROSE-NACL 20-5-0.45 MEQ/L-%-% IV SOLN
INTRAVENOUS | Status: DC
  Administered 2012-11-13 – 2012-11-17 (×4): via INTRAVENOUS
  Filled 2012-11-13 (×10): qty 1000

## 2012-11-13 MED ORDER — HEPARIN SODIUM (PORCINE) 5000 UNIT/ML IJ SOLN
5000.0000 [IU] | Freq: Three times a day (TID) | INTRAMUSCULAR | Status: DC
  Administered 2012-11-13 – 2012-11-14 (×5): 5000 [IU] via SUBCUTANEOUS
  Filled 2012-11-13 (×7): qty 1

## 2012-11-13 MED ORDER — LEVOTHYROXINE SODIUM 100 MCG IV SOLR
50.0000 ug | Freq: Every day | INTRAVENOUS | Status: DC
  Administered 2012-11-14: 50 ug via INTRAVENOUS
  Filled 2012-11-13 (×3): qty 5

## 2012-11-13 MED ORDER — ASPIRIN 300 MG RE SUPP
300.0000 mg | Freq: Every day | RECTAL | Status: DC
  Administered 2012-11-13: 300 mg via RECTAL
  Filled 2012-11-13 (×2): qty 1

## 2012-11-13 MED ORDER — POTASSIUM CHLORIDE 10 MEQ/50ML IV SOLN
10.0000 meq | INTRAVENOUS | Status: AC
  Administered 2012-11-13 (×4): 10 meq via INTRAVENOUS
  Filled 2012-11-13 (×4): qty 50

## 2012-11-13 NOTE — Progress Notes (Signed)
Left radial Arterial line removed. Unremarkable, catheter intact.   Jacqulyn Cane RN, BSN, CCRN

## 2012-11-13 NOTE — Progress Notes (Signed)
PULMONARY  / CRITICAL CARE MEDICINE  Name: Benjamin Park MRN: [id number] DOB: 1940/08/19    ADMISSION DATE:  11/11/2012 CONSULTATION DATE:  11/11/2012  REFERRING MD :  [facility] PRIMARY SERVICE:  PCCM  CHIEF COMPLAINT:  Acute encephalopathy  BRIEF PATIENT DESCRIPTION: 72 yo with past medical history of TIA (2011) and SAH (2011) as well as intermittent episodes of encephalopathy associated with hypertension since 2003  brought to ED with altered mental status and hypertension.  In ED: developed emesis - intubated for airway protection.  CT revealed no acute findings.  Seizures were suspected and Keppra given.  PCCM was consulted.  SIGNIFICANT EVENTS: 8/25 Admit with TME, seizure, VDRF, difficulty placing Lt IJ CVL 8/26 Fever, hypotension, pressors added, started Abx  STUDIES:  8/25  Head CT >>> atrophy, chronic ischemic changes, Lt maxillary sinus disease 8/26 MRI head >> unusual pattern restricted to corpus callosum 8/26 EEG >> generalized slowing consistent with normal sleep, no epileptiform activity  LINES / TUBES: ETT 8/25 >>> A line 8/26 >>> CVL 8/26 >>>  CULTURES: Sputum 8/26 >> Urine 8/26 >> Blood 8/26 >>  ANTIBIOTICS: Vancomycin 8/26 >> Zosyn 8/26 >>  INTERVAL HISTORY: No fever.  More alert.  Tolerating SBT.  Denies headache, chest pain, abdominal pain.  Off pressors.  VITAL SIGNS: Temp:  [98.3 F (36.8 C)-100.3 F (37.9 C)] 98.6 F (37 C) (08/27 0819) Pulse Rate:  [51-93] 83 (08/27 0805) Resp:  [12-21] 20 (08/27 0805) BP: (59-165)/(36-100) 147/88 mmHg (08/27 0805) SpO2:  [80 %-100 %] 100 % (08/27 0805) Arterial Line BP: (54-247)/(34-121) 156/86 mmHg (08/27 0800) FiO2 (%):  [40 %] 40 % (08/27 0806) Weight:  [179 lb 0.2 oz (81.2 kg)] 179 lb 0.2 oz (81.2 kg) (08/27 0530)  HEMODYNAMICS: CVP:  [1 mmHg-6 mmHg] 5 mmHg  VENTILATOR SETTINGS: Vent Mode:  [-] CPAP;PSV FiO2 (%):  [40 %] 40 % Set Rate:  [12 bmp] 12 bmp Vt Set:  [630 mL] 630 mL PEEP:  [5 cmH20] 5  cmH20 Pressure Support:  [5 cmH20] 5 cmH20 Plateau Pressure:  [16 cmH20-18 cmH20] 18 cmH20  INTAKE / OUTPUT: Intake/Output     08/26 0701 - 08/27 0700 08/27 0701 - 08/28 0700   I.V. (mL/kg) 2478.3 (30.5)    NG/GT 150    IV Piggyback 560    Total Intake(mL/kg) 3188.3 (39.3)    Urine (mL/kg/hr) 2770 (1.4)    Emesis/NG output 550 (0.3)    Total Output 3320     Net -131.7           PHYSICAL EXAMINATION: General: No distress Neuro: Alert, follows commands, moves extremities HEENT: ETT in place Cardiovascular: regular Lungs: decreased breath sounds, faint wheeze Lt base Abdomen:  Soft, nontender, bowel sounds diminished Musculoskeletal: no edema Skin: no rashes  LABS: CBC Recent Labs     11/12/12  0410  11/12/12  0500  11/13/12  0435  WBC  14.2*  14.5*  11.0*  HGB  15.7  16.0  13.4  HCT  44.8  43.6  39.0  PLT  171  165  128*    Coag's Recent Labs     11/11/12  1546  APTT  36  INR  1.05    BMET Recent Labs     11/11/12  1546  11/11/12  1556  11/12/12  0410  11/13/12  0435  NA  141  143  140  144  K  3.4*  3.5  3.2*  3.0*  CL  105  105  104  116*  CO2  27   --   24  20  BUN  23  23  26*  17  CREATININE  1.17  1.30  1.24  1.06  GLUCOSE  119*  123*  146*  122*    Electrolytes Recent Labs     11/11/12  1546  11/12/12  0410  11/13/12  0435  CALCIUM  9.4  8.8  8.1*    Sepsis Markers Recent Labs     11/12/12  1223  PROCALCITON  <0.10    ABG Recent Labs     11/11/12  2008  11/13/12  0433  PHART  7.374  7.392  PCO2ART  40.2  29.6*  PO2ART  124.0*  123.0*    Liver Enzymes Recent Labs     11/11/12  1546  AST  18  ALT  15  ALKPHOS  68  BILITOT  0.6  ALBUMIN  3.9    Cardiac Enzymes Recent Labs     11/11/12  1546  TROPONINI  <0.30    Glucose Recent Labs     11/12/12  1132  11/12/12  1542  11/12/12  2052  11/12/12  2327  11/13/12  0345  11/13/12  0751  GLUCAP  151*  150*  130*  115*  103*  89    Imaging Ct Head Wo  Contrast  11/11/2012   *RADIOLOGY REPORT*  Clinical Data: Severe headache, confusion and slurred speech  CT HEAD WITHOUT CONTRAST  Technique:  Contiguous axial images were obtained from the base of the skull through the vertex without contrast.  Comparison: Most recent prior head CT 04/09/2012  Findings: No acute intracranial hemorrhage, acute infarction, mass lesion, mass effect, midline shift or hydrocephalus.  Gray-white differentiation is preserved throughout.  Stable appearance of advanced cerebral and cerebellar volume loss and mild - moderate chronic microvascular ischemic white matter changes.  No focal soft tissue abnormality or calvarial abnormality.  Globes and orbits are intact and unremarkable.  Debris layer within the left maxillary sinus.  Mastoid air cells are normally aerated.  IMPRESSION:  1.  No acute intracranial abnormality. 2.  Stable age advanced atrophy and mild chronic ischemic microvascular white matter disease. 3.  Left maxillary paranasal sinus disease.  These results were called by telephone on 11/11/2012 at 4:00 p.m. to Dr. Roseanne Reno, who verbally acknowledged these results.   Original Report Authenticated By: Malachy Moan, M.D.   Mr Brain Wo Contrast  11/12/2012   *RADIOLOGY REPORT*  Clinical Data: Altered mental status.  Hypertension.  MRI HEAD WITHOUT CONTRAST  Technique:  Multiplanar, multiecho pulse sequences of the brain and surrounding structures were obtained according to standard protocol without intravenous contrast.  Comparison: 02/24/2011 MRI most recent.  Findings: An unusual and fairly symmetric pattern of restricted diffusion affects the anterior greater than posterior corpus callosum including the splenium, along with a subcentimeter foci of low-level diffusion restriction involving the bilateral frontal and parietal subcortical white matter.  There is no involvement of the cerebral cortex.  A similar pattern can be seen on previous MR from July and December 2012,  and likely  similar involvement of the left splenium in January 2008. 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  Advanced atrophy with chronic microvascular ischemic change.  No hemorrhage.  No mass lesion or hydrocephalus.  No extra-axial fluid.  No evidence for posterior circulation vasogenic edema to  suggest hypertensive encephalopathy.  Flow voids are maintained in the major intracranial vascular structures.  Heavy mineralization of the dentate nuclei is noted.  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  No acute orbital findings.  Bilateral sinus disease most notable on the left maxillary region.  No mastoid fluid.  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  The pattern of diffusion and T2 signal abnormality is not suggestive of an ischemic process.  Advanced atrophy and chronic microvascular ischemic change.  There are no features suggestive of hypertensive encephalopathy.   Original Report Authenticated By: Davonna Belling, M.D.   Dg Chest Port 1 View  11/13/2012   *RADIOLOGY REPORT*  Clinical Data: Respiratory failure.  Atelectasis.  PORTABLE CHEST - 1 VIEW  Comparison: Chest 11/11/2012 and 11/12/2012.  Findings: Support tubes and lines are unchanged.  Left basilar airspace disease shows some worsening.  The right lung remains clear.  No pneumothorax identified.  There is likely a small left effusion.  IMPRESSION:  Some increase in left basilar airspace disease and small effusion. No other change.   Original Report Authenticated By: Holley Dexter, M.D.   Dg Chest Port 1 View  11/12/2012   *RADIOLOGY REPORT*  Clinical Data: Right CVL placement  PORTABLE CHEST - 1 VIEW  Comparison: 11/12/2012 at 0733  hours  Findings: Endotracheal tube terminates 5.5 cm above the carina.  Enteric tube courses below the diaphragm.  Interval placement of a right IJ venous catheter which terminates at the cavoatrial junction.  Mild left basilar opacity, likely reflecting a trace left pleural effusion with associated atelectasis.  No pneumothorax.  The heart is normal in size.  Atrial closure device.  Additional line/leads overlying the heart.  IMPRESSION: Interval placement of a right IJ venous catheter which terminates at the cavoatrial junction.  No pneumothorax.   Original Report Authenticated By: Charline Bills, M.D.   Dg Chest Port 1 View  11/12/2012   *RADIOLOGY REPORT*  Clinical Data: To evaluate ET tube position, lung fields.  PORTABLE CHEST - 1 VIEW  Comparison: 11/11/2012  Findings: Endotracheal tube and NG tube are unchanged.  Heart is upper limits normal in size.  Slight increasing interstitial prominence within the lungs, right greater than left.  Question interstitial edema.  No confluent opacities.  No effusions or acute bony abnormality.  IMPRESSION: Slight increasing interstitial prominence, right slightly greater than left.  Question early interstitial edema.   Original Report Authenticated By: Charlett Nose, M.D.   Dg Chest Port 1 View  11/11/2012   CLINICAL DATA:  Post intubation.  EXAM: PORTABLE CHEST - 1 VIEW  COMPARISON:  04/09/2012  FINDINGS: Endotracheal tube is 4.6 cm above the chronic. NG tube enters the stomach. Lungs are clear. Heart is normal size. No effusions.  IMPRESSION: Endotracheal tube 4.6 cm above the chronic. No active disease.   Electronically Signed   By: Charlett Nose   On: 11/11/2012 20:25       ASSESSMENT / PLAN:  PULMONARY A: Acute respiratory failure secondary to mental status change. Hx of OSA. P:   -proceed with extubation -CPAP qhs after extubation -oxygen to keep SpO2 > 92% -f/u CXR -bronchial hygiene  CARDIOVASCULAR A: HTN emergency on admission >>  resolved. Developed hypotension 8/26 >> likely from hypovolemia >> improved.  Cortisol 21.3 from 8/26. Hx of hyperlipidemia. P:  -continue IV fluids -monitor  CVP qshift for now >> goal CVP > 6 -hold solu cortef since he is off pressors -hold zocor, lovaza for now  RENAL A: CKD 2. Hypokalemia. P:   -monitor renal fx, urine outpt, electrolytes -add KCL to IV fluids  GASTROINTESTINAL A: Blood from mouth >> ? Related to tongue bite during seizure vs GI bleed >> no further bleeding. P:   -change protonix to qdaily -NPO for now -will need swallow assessment after extubation  HEMATOLOGIC A: ?oral bleeding on admit >> not noted since. P:  -add SQ heparin for DVT prevention -f/u CBC  INFECTIOUS A: Fever/hypotension with concern for sepsis ? Source >> most likely from aspiration vs seizures. P:   -f/u cultures -D2/x vancomycin/zosyn  ENDOCRINE  A:  Hx of hypothyroidism.  P:   -continue synthroid  NEUROLOGIC A: Acute encephalopathy with new onset seizure >> initial concern for hypertensive encephalopathy >> improved mental status 8/27. Hx of recurrent TIA's, SAH. P:   -carotid doppler -keppra per neurology -cotninue ASA per neurology  CC time 35 minutes.  Coralyn Helling, MD [facility] Pulmonary/Critical Care 11/13/2012, 8:19 AM Pager:  [phone] After 3pm call: [phone]

## 2012-11-13 NOTE — Progress Notes (Signed)
eLink Nursing ICU Electrolyte Replacement Protocol  Patient Name: KEATH MATERA DOB: [birth year] MRN: [id number]  Date of Service  11/13/2012   HPI/Events of Note    Recent Labs Lab 11/11/12 1546 11/11/12 1556 11/12/12 0410 11/13/12 0435  NA 141 143 140 144  K 3.4* 3.5 3.2* 3.0*  CL 105 105 104 116*  CO2 27  --  24 20  GLUCOSE 119* 123* 146* 122*  BUN 23 23 26* 17  CREATININE 1.17 1.30 1.24 1.06  CALCIUM 9.4  --  8.8 8.1*    Estimated Creatinine Clearance: 71.2 ml/min (by C-G formula based on Cr of 1.06).  Intake/Output     08/26 0701 - 08/27 0700   I.V. (mL/kg) 2367.2 (29.2)   NG/GT 150   IV Piggyback 560   Total Intake(mL/kg) 3077.2 (37.9)   Urine (mL/kg/hr) 2770 (1.4)   Emesis/NG output 550 (0.3)   Total Output 3320   Net -242.8        - I/O DETAILED x24h    Total I/O In: 1590.9 [I.V.:1235.9; IV Piggyback:355] Out: 715 [Urine:615; Emesis/NG output:100] - I/O THIS SHIFT    ASSESSMENT   eICURN Interventions  K+ 3.0 Electrolyte protocol criteria met. Lab replaced per protocol. MD notified    ASSESSMENT: MAJOR ELECTROLYTE    Merita Norton 11/13/2012, 6:22 AM

## 2012-11-13 NOTE — Progress Notes (Deleted)
Subjective: Patient improved today.  Extubated.  Alert.  Confusion improved.    Objective: Current vital signs: BP 157/58  Pulse 78  Temp(Src) 99 F (37.2 C) (Core (Comment))  Resp 20  Ht 6\' 2"  (1.88 m)  Wt 87.3 kg (192 lb 7.4 oz)  BMI 24.7 kg/m2  SpO2 100% Vital signs in last 24 hours: Temp:  [96 F (35.6 C)-99.3 F (37.4 C)] 99 F (37.2 C) (08/27 1200) Pulse Rate:  [67-97] 78 (08/27 1200) Resp:  [8-25] 20 (08/27 1200) BP: (83-263)/(28-126) 157/58 mmHg (08/27 1200) SpO2:  [96 %-100 %] 100 % (08/27 1200) FiO2 (%):  [30 %-50 %] 30 % (08/27 1200) Weight:  [84.9 kg (187 lb 2.7 oz)-87.3 kg (192 lb 7.4 oz)] 87.3 kg (192 lb 7.4 oz) (08/27 0500)  Intake/Output from previous day: 08/26 0701 - 08/27 0700 In: 2299 [I.V.:1015; IV Piggyback:1284] Out: 775 [Urine:775] Intake/Output this shift: Total I/O In: 586.8 [I.V.:479.3; IV Piggyback:107.5] Out: 450 [Urine:450] Nutritional status: NPO  Neurologic Exam: Mental Status: Alert, oriented, thought content appropriate.  Speech fluent without evidence of aphasia.  Able to follow 3 step commands without difficulty. Cranial Nerves: II: Discs flat bilaterally; Visual fields grossly normal, pupils equal, round, reactive to light and accommodation III,IV, VI: ptosis not present, extra-ocular motions intact bilaterally V,VII: smile symmetric, facial light touch sensation normal bilaterally VIII: hearing normal bilaterally IX,X: gag reflex present XI: bilateral shoulder shrug XII: midline tongue extension Motor: Moves all extremities strongly against gravity.  No focality noted Deep Tendon Reflexes: 2+ and symmetric throughout  Lab Results: Basic Metabolic Panel:  Recent Labs Lab 11/12/12 1829 11/13/12 0500  NA 139 143  K 4.0 3.0*  CL 103 108  CO2 23 24  GLUCOSE 231* 140*  BUN 39* 40*  CREATININE 1.69* 1.71*  CALCIUM 8.6 8.1*  MG  --  1.6  PHOS  --  3.5    Liver Function Tests:  Recent Labs Lab 11/12/12 1829  AST  32  ALT 22  ALKPHOS 89  BILITOT 0.4  PROT 7.0  ALBUMIN 3.3*    Recent Labs Lab 11/12/12 1829  LIPASE 21   No results found for this basename: AMMONIA,  in the last 168 hours  CBC:  Recent Labs Lab 11/12/12 1829 11/13/12 0500  WBC 3.5* 3.9*  NEUTROABS 2.6  --   HGB 9.8* 8.1*  HCT 30.4* 24.8*  MCV 95.9 95.0  PLT 114* 92*    Cardiac Enzymes:  Recent Labs Lab 11/12/12 1829  TROPONINI <0.30    Lipid Panel:  Recent Labs Lab 11/13/12 1135  TRIG 82    CBG:  Recent Labs Lab 11/12/12 2345 11/13/12 0357 11/13/12 0802 11/13/12 1153 11/13/12 1531  GLUCAP 213* 153* 130* 129* 109*    Microbiology: Results for orders placed during the hospital encounter of 11/12/12  MRSA PCR SCREENING     Status: None   Collection Time    11/12/12  9:45 PM      Result Value Range Status   MRSA by PCR NEGATIVE  NEGATIVE Final   Comment:            The GeneXpert MRSA Assay (FDA     approved for NASAL specimens     only), is one component of a     comprehensive MRSA colonization     surveillance program. It is not     intended to diagnose MRSA     infection nor to guide or     monitor treatment for  MRSA infections.  MRSA PCR SCREENING     Status: None   Collection Time    11/12/12 10:05 PM      Result Value Range Status   MRSA by PCR NEGATIVE  NEGATIVE Final   Comment:            The GeneXpert MRSA Assay (FDA     approved for NASAL specimens     only), is one component of a     comprehensive MRSA colonization     surveillance program. It is not     intended to diagnose MRSA     infection nor to guide or     monitor treatment for     MRSA infections.    Coagulation Studies:  Recent Labs  11/12/12 1829  LABPROT 15.1  INR 1.22    Imaging: Ct Head Wo Contrast  11/12/2012   *RADIOLOGY REPORT*  Clinical Data: Altered mental status, seizures.  CT HEAD WITHOUT CONTRAST  Technique:  Contiguous axial images were obtained from the base of the skull through  the vertex without contrast.  Comparison: 10/13/2012  Findings: Atherosclerotic and physiologic intracranial calcifications.  Retention cyst or polyp in the left maxillary sinus.  Diffuse parenchymal atrophy. Patchy areas of hypoattenuation in deep and periventricular white matter bilaterally. Negative for acute intracranial hemorrhage, mass lesion, acute infarction, midline shift, or mass-effect. Acute infarct may be inapparent on noncontrast CT. Ventricles and sulci symmetric. Bone windows demonstrate no focal lesion.  IMPRESSION:  1. Negative for bleed or other acute intracranial process.  2. Atrophy and nonspecific white matter changes.   Original Report Authenticated By: D. Andria Rhein, MD   Dg Chest Port 1 View  11/13/2012   *RADIOLOGY REPORT*  Clinical Data: Intubated  PORTABLE CHEST - 1 VIEW  Comparison: Prior chest x-ray 11/12/2012  Findings: The endotracheal tube is 2.9 cm above the carina. Nasogastric tube noted with the tip in the gastric fundus. Decreased aeration of the lung fields secondary to slightly increased pulmonary vascular congestion and right basilar opacity. There is persistent elevation of the right hemidiaphragm and layering right larger than left pleural effusions and associated bibasilar atelectasis versus infiltrates.  Stable cardiomegaly and aortic atherosclerosis.  No pneumothorax or acute osseous abnormality.  IMPRESSION:  1.  Slightly worsened aeration secondary to increased pulmonary vascular congestion and right basilar airspace opacity. 2.  Right greater than left layering pleural effusions and associated atelectasis versus infiltrate. 3.  Stable and satisfactory support apparatus.   Original Report Authenticated By: Malachy Moan, M.D.   Dg Chest Port 1 View  11/12/2012   *RADIOLOGY REPORT*  Clinical Data: Endotracheal tube placement  PORTABLE CHEST - 1 VIEW  Comparison: 10/13/2012  Findings: Cardiomegaly again noted.  Endotracheal tube in place with tip 5 cm above the  carina.  NG tube in place.  No diagnostic pneumothorax.  No pulmonary edema.  Probable small right pleural effusion with right basilar atelectasis or infiltrate.  Minimal left basilar atelectasis.  IMPRESSION:  Endotracheal tube in place with tip 5 cm above the carina.  NG tube in place.  No diagnostic pneumothorax.  No pulmonary edema. Probable small right pleural effusion with right basilar atelectasis or infiltrate.  Minimal left basilar atelectasis.   Original Report Authenticated By: Natasha Mead, M.D.    Medications:  I have reviewed the patient's current medications. Scheduled: . antiseptic oral rinse  1 application Mouth Rinse QID  . chlorhexidine  15 mL Mouth/Throat BID  . doxycycline  100 mg Per Tube  Q12H  . insulin aspart  0-15 Units Subcutaneous Q4H  . levETIRAcetam  750 mg Intravenous Q12H  . ondansetron  4 mg Intravenous Once  . pantoprazole sodium  40 mg Per Tube Q24H  . potassium chloride  40 mEq Per Tube Once    Assessment/Plan: Patient seems to have returned to baseline.  Has had multiple similar events in the past. Currently on Keppra and tolerating well.  MRI reviewed and has changes consistent with seizure.    Recommendations: 1.  Continue Keppra at current dose.  Would change to po once patient able to take po.  Dosing is 1:1 2.  Continue seizure precautions.     LOS: 1 day   Thana Farr, MD [facility] [phone] 11/13/2012  3:48 PM

## 2012-11-13 NOTE — Progress Notes (Signed)
Subjective: Patient improved today. Extubated. Alert. Confusion improved  Objective: Current vital signs: BP 130/85  Pulse 80  Temp(Src) 98.4 F (36.9 C) (Oral)  Resp 17  Ht 6' 0.5" (1.842 m)  Wt 81.2 kg (179 lb 0.2 oz)  BMI 23.93 kg/m2  SpO2 98% Vital signs in last 24 hours: Temp:  [98.2 F (36.8 C)-98.7 F (37.1 C)] 98.4 F (36.9 C) (08/27 1540) Pulse Rate:  [51-89] 80 (08/27 1500) Resp:  [12-21] 17 (08/27 1500) BP: (79-165)/(44-100) 130/85 mmHg (08/27 1500) SpO2:  [84 %-100 %] 98 % (08/27 1500) Arterial Line BP: (63-169)/(41-112) 123/68 mmHg (08/27 1000) FiO2 (%):  [36 %-40 %] 36 % (08/27 0823) Weight:  [81.2 kg (179 lb 0.2 oz)] 81.2 kg (179 lb 0.2 oz) (08/27 0530)  Intake/Output from previous day: 08/26 0701 - 08/27 0700 In: 3188.3 [I.V.:2478.3; NG/GT:150; IV Piggyback:560] Out: 3320 [Urine:2770; Emesis/NG output:550] Intake/Output this shift: Total I/O In: 764.6 [I.V.:279.6; NG/GT:30; IV Piggyback:455] Out: 795 [Urine:795] Nutritional status: NPO  Neurologic Exam: Mental Status:  Alert, oriented, thought content appropriate. Speech fluent without evidence of aphasia. Able to follow 3 step commands without difficulty.  Cranial Nerves:  II: Discs flat bilaterally; Visual fields grossly normal, pupils equal, round, reactive to light and accommodation  III,IV, VI: ptosis not present, extra-ocular motions intact bilaterally  V,VII: smile symmetric, facial light touch sensation normal bilaterally  VIII: hearing normal bilaterally  IX,X: gag reflex present  XI: bilateral shoulder shrug  XII: midline tongue extension  Motor:  Moves all extremities strongly against gravity. No focality noted  Deep Tendon Reflexes: 2+ and symmetric throughout   Lab Results: Results for orders placed during the hospital encounter of 11/11/12 (from the past 48 hour(s))  GLUCOSE, CAPILLARY     Status: Abnormal   Collection Time    11/11/12  4:00 PM      Result Value Range    Glucose-Capillary 123 (*) 70 - 99 mg/dL   Comment 1 Documented in Chart     Comment 2 Notify RN    POCT I-STAT TROPONIN I     Status: None   Collection Time    11/11/12  4:08 PM      Result Value Range   Troponin i, poc 0.00  0.00 - 0.08 ng/mL   Comment 3            Comment: Due to the release kinetics of cTnI,     a negative result within the first hours     of the onset of symptoms does not rule out     myocardial infarction with certainty.     If myocardial infarction is still suspected,     repeat the test at appropriate intervals.  URINE RAPID DRUG SCREEN (HOSP PERFORMED)     Status: None   Collection Time    11/11/12  5:08 PM      Result Value Range   Opiates NONE DETECTED  NONE DETECTED   Cocaine NONE DETECTED  NONE DETECTED   Benzodiazepines NONE DETECTED  NONE DETECTED   Amphetamines NONE DETECTED  NONE DETECTED   Tetrahydrocannabinol NONE DETECTED  NONE DETECTED   Barbiturates NONE DETECTED  NONE DETECTED   Comment:            DRUG SCREEN FOR MEDICAL PURPOSES     ONLY.  IF CONFIRMATION IS NEEDED     FOR ANY PURPOSE, NOTIFY LAB     WITHIN 5 DAYS.  LOWEST DETECTABLE LIMITS     FOR URINE DRUG SCREEN     Drug Class       Cutoff (ng/mL)     Amphetamine      1000     Barbiturate      200     Benzodiazepine   200     Tricyclics       300     Opiates          300     Cocaine          300     THC              50  URINALYSIS, ROUTINE W REFLEX MICROSCOPIC     Status: Abnormal   Collection Time    11/11/12  5:08 PM      Result Value Range   Color, Urine YELLOW  YELLOW   APPearance CLOUDY (*) CLEAR   Specific Gravity, Urine 1.011  1.005 - 1.030   pH 7.5  5.0 - 8.0   Glucose, UA NEGATIVE  NEGATIVE mg/dL   Hgb urine dipstick NEGATIVE  NEGATIVE   Bilirubin Urine NEGATIVE  NEGATIVE   Ketones, ur NEGATIVE  NEGATIVE mg/dL   Protein, ur NEGATIVE  NEGATIVE mg/dL   Urobilinogen, UA 0.2  0.0 - 1.0 mg/dL   Nitrite NEGATIVE  NEGATIVE   Leukocytes, UA NEGATIVE   NEGATIVE   Comment: MICROSCOPIC NOT DONE ON URINES WITH NEGATIVE PROTEIN, BLOOD, LEUKOCYTES, NITRITE, OR GLUCOSE <1000 mg/dL.  POCT I-STAT 3, BLOOD GAS (G3+)     Status: Abnormal   Collection Time    11/11/12  8:08 PM      Result Value Range   pH, Arterial 7.374  7.350 - 7.450   pCO2 arterial 40.2  35.0 - 45.0 mmHg   pO2, Arterial 124.0 (*) 80.0 - 100.0 mmHg   Bicarbonate 23.5  20.0 - 24.0 mEq/L   TCO2 25  0 - 100 mmol/L   O2 Saturation 99.0     Acid-base deficit 2.0  0.0 - 2.0 mmol/L   Patient temperature 98.6 F     Collection site RADIAL, ALLEN'S TEST ACCEPTABLE     Drawn by Operator     Sample type ARTERIAL    GLUCOSE, CAPILLARY     Status: Abnormal   Collection Time    11/11/12  8:40 PM      Result Value Range   Glucose-Capillary 149 (*) 70 - 99 mg/dL  MRSA PCR SCREENING     Status: None   Collection Time    11/11/12  9:27 PM      Result Value Range   MRSA by PCR NEGATIVE  NEGATIVE   Comment:            The GeneXpert MRSA Assay (FDA     approved for NASAL specimens     only), is one component of a     comprehensive MRSA colonization     surveillance program. It is not     intended to diagnose MRSA     infection nor to guide or     monitor treatment for     MRSA infections.  URINALYSIS, ROUTINE W REFLEX MICROSCOPIC     Status: Abnormal   Collection Time    11/11/12  9:27 PM      Result Value Range   Color, Urine YELLOW  YELLOW   APPearance CLOUDY (*) CLEAR   Specific Gravity, Urine 1.014  1.005 - 1.030  pH 8.0  5.0 - 8.0   Glucose, UA 100 (*) NEGATIVE mg/dL   Hgb urine dipstick MODERATE (*) NEGATIVE   Bilirubin Urine NEGATIVE  NEGATIVE   Ketones, ur 15 (*) NEGATIVE mg/dL   Protein, ur >161 (*) NEGATIVE mg/dL   Urobilinogen, UA 0.2  0.0 - 1.0 mg/dL   Nitrite NEGATIVE  NEGATIVE   Leukocytes, UA NEGATIVE  NEGATIVE  URINE MICROSCOPIC-ADD ON     Status: None   Collection Time    11/11/12  9:27 PM      Result Value Range   Squamous Epithelial / LPF RARE  RARE    WBC, UA 0-2  <3 WBC/hpf   RBC / HPF 3-6  <3 RBC/hpf   Bacteria, UA RARE  RARE   Urine-Other AMORPHOUS URATES/PHOSPHATES    URINE CULTURE     Status: None   Collection Time    11/11/12  9:27 PM      Result Value Range   Specimen Description URINE, CATHETERIZED     Special Requests ADDED [id number]     Culture  Setup Time       Value: 11/12/2012 13:02     Performed at [facility] Count       Value: NO GROWTH     Performed at [facility]   Culture       Value: NO GROWTH     Performed at [facility]   Report Status 11/13/2012 FINAL    GLUCOSE, CAPILLARY     Status: Abnormal   Collection Time    11/11/12 11:21 PM      Result Value Range   Glucose-Capillary 128 (*) 70 - 99 mg/dL  BASIC METABOLIC PANEL     Status: Abnormal   Collection Time    11/12/12  4:10 AM      Result Value Range   Sodium 140  135 - 145 mEq/L   Potassium 3.2 (*) 3.5 - 5.1 mEq/L   Chloride 104  96 - 112 mEq/L   CO2 24  19 - 32 mEq/L   Glucose, Bld 146 (*) 70 - 99 mg/dL   BUN 26 (*) 6 - 23 mg/dL   Creatinine, Ser 4.09  0.50 - 1.35 mg/dL   Calcium 8.8  8.4 - 81.1 mg/dL   GFR calc non Af Amer 57 (*) >90 mL/min   GFR calc Af Amer 66 (*) >90 mL/min   Comment: (NOTE)     The eGFR has been calculated using the CKD EPI equation.     This calculation has not been validated in all clinical situations.     eGFR's persistently <90 mL/min signify possible Chronic Kidney     Disease.  CBC     Status: Abnormal   Collection Time    11/12/12  4:10 AM      Result Value Range   WBC 14.2 (*) 4.0 - 10.5 K/uL   RBC 4.79  4.22 - 5.81 MIL/uL   Hemoglobin 15.7  13.0 - 17.0 g/dL   HCT 91.4  78.2 - 95.6 %   MCV 93.5  78.0 - 100.0 fL   MCH 32.8  26.0 - 34.0 pg   MCHC 35.0  30.0 - 36.0 g/dL   RDW 21.3  08.6 - 57.8 %   Platelets 171  150 - 400 K/uL  HEMOGLOBIN A1C     Status: None   Collection Time    11/12/12  4:10 AM  Result Value Range   Hemoglobin A1C 5.5  <5.7 %   Comment:  (NOTE)                                                                               According to the ADA Clinical Practice Recommendations for 2011, when     HbA1c is used as a screening test:      >=6.5%   Diagnostic of Diabetes Mellitus               (if abnormal result is confirmed)     5.7-6.4%   Increased risk of developing Diabetes Mellitus     References:Diagnosis and Classification of Diabetes Mellitus,Diabetes     Care,2011,34(Suppl 1):S62-S69 and Standards of Medical Care in             Diabetes - 2011,Diabetes Care,2011,34 (Suppl 1):S11-S61.   Mean Plasma Glucose 111  <117 mg/dL   Comment: Performed at [facility]  LIPID PANEL     Status: Abnormal   Collection Time    11/12/12  4:10 AM      Result Value Range   Cholesterol 155  0 - 200 mg/dL   Triglycerides 161  <096 mg/dL   HDL 35 (*) >04 mg/dL   Total CHOL/HDL Ratio 4.4     VLDL 21  0 - 40 mg/dL   LDL Cholesterol 99  0 - 99 mg/dL   Comment:            Total Cholesterol/HDL:CHD Risk     Coronary Heart Disease Risk Table                         Men   Women      1/2 Average Risk   3.4   3.3      Average Risk       5.0   4.4      2 X Average Risk   9.6   7.1      3 X Average Risk  23.4   11.0                Use the calculated Patient Ratio     above and the CHD Risk Table     to determine the patient's CHD Risk.                ATP III CLASSIFICATION (LDL):      <100     mg/dL   Optimal      540-981  mg/dL   Near or Above                        Optimal      130-159  mg/dL   Borderline      191-478  mg/dL   High      >295     mg/dL   Very High  CBC WITH DIFFERENTIAL     Status: Abnormal   Collection Time    11/12/12  5:00 AM      Result Value Range   WBC 14.5 (*) 4.0 - 10.5 K/uL   RBC 4.68  4.22 -  5.81 MIL/uL   Hemoglobin 16.0  13.0 - 17.0 g/dL   HCT 40.9  81.1 - 91.4 %   MCV 93.2  78.0 - 100.0 fL   MCH 34.2 (*) 26.0 - 34.0 pg   MCHC 36.7 (*) 30.0 - 36.0 g/dL   RDW 78.2  95.6 - 21.3 %   Platelets  165  150 - 400 K/uL   Neutrophils Relative % 87 (*) 43 - 77 %   Neutro Abs 12.5 (*) 1.7 - 7.7 K/uL   Lymphocytes Relative 5 (*) 12 - 46 %   Lymphs Abs 0.7  0.7 - 4.0 K/uL   Monocytes Relative 9  3 - 12 %   Monocytes Absolute 1.3 (*) 0.1 - 1.0 K/uL   Eosinophils Relative 0  0 - 5 %   Eosinophils Absolute 0.0  0.0 - 0.7 K/uL   Basophils Relative 0  0 - 1 %   Basophils Absolute 0.0  0.0 - 0.1 K/uL  CULTURE, BLOOD (ROUTINE X 2)     Status: None   Collection Time    11/12/12  6:30 AM      Result Value Range   Specimen Description BLOOD RIGHT ANTECUBITAL     Special Requests BOTTLES DRAWN AEROBIC AND ANAEROBIC     Culture  Setup Time       Value: 11/12/2012 11:21     Performed at [facility]   Culture       Value:        BLOOD CULTURE RECEIVED NO GROWTH TO DATE CULTURE WILL BE HELD FOR 5 DAYS BEFORE ISSUING A FINAL NEGATIVE REPORT     Performed at [facility]   Report Status PENDING    LACTIC ACID, PLASMA     Status: None   Collection Time    11/12/12  6:37 AM      Result Value Range   Lactic Acid, Venous 1.2  0.5 - 2.2 mmol/L  CULTURE, BLOOD (ROUTINE X 2)     Status: None   Collection Time    11/12/12  6:50 AM      Result Value Range   Specimen Description BLOOD LEFT HAND     Special Requests BOTTLES DRAWN AEROBIC ONLY 1CC     Culture  Setup Time       Value: 11/12/2012 11:21     Performed at [facility]   Culture       Value:        BLOOD CULTURE RECEIVED NO GROWTH TO DATE CULTURE WILL BE HELD FOR 5 DAYS BEFORE ISSUING A FINAL NEGATIVE REPORT     Performed at [facility]   Report Status PENDING    GLUCOSE, CAPILLARY     Status: Abnormal   Collection Time    11/12/12  8:15 AM      Result Value Range   Glucose-Capillary 109 (*) 70 - 99 mg/dL  GLUCOSE, CAPILLARY     Status: Abnormal   Collection Time    11/12/12 11:32 AM      Result Value Range   Glucose-Capillary 151 (*) 70 - 99 mg/dL  CORTISOL     Status: None   Collection Time     11/12/12 12:23 PM      Result Value Range   Cortisol, Plasma 21.3     Comment: (NOTE)     AM:  4.3 - 22.4 ug/dL     PM:  3.1 - 08.6 ug/dL     Performed  at [facility]  PROCALCITONIN     Status: None   Collection Time    11/12/12 12:23 PM      Result Value Range   Procalcitonin <0.10     Comment:            Interpretation:     PCT (Procalcitonin) <= 0.5 ng/mL:     Systemic infection (sepsis) is not likely.     Local bacterial infection is possible.     (NOTE)             ICU PCT Algorithm               Non ICU PCT Algorithm        ----------------------------     ------------------------------             PCT < 0.25 ng/mL                 PCT < 0.1 ng/mL         Stopping of antibiotics            Stopping of antibiotics           strongly encouraged.               strongly encouraged.        ----------------------------     ------------------------------           PCT level decrease by               PCT < 0.25 ng/mL           >= 80% from peak PCT           OR PCT 0.25 - 0.5 ng/mL          Stopping of antibiotics                                                 encouraged.         Stopping of antibiotics               encouraged.        ----------------------------     ------------------------------           PCT level decrease by              PCT >= 0.25 ng/mL           < 80% from peak PCT            AND PCT >= 0.5 ng/mL            Continuing antibiotics                                                  encouraged.           Continuing antibiotics                encouraged.        ----------------------------     ------------------------------         PCT level increase compared          PCT > 0.5 ng/mL             with peak  PCT AND              PCT >= 0.5 ng/mL             Escalation of antibiotics                                              strongly encouraged.          Escalation of antibiotics            strongly encouraged.  LACTIC ACID, PLASMA     Status:  None   Collection Time    11/12/12 12:23 PM      Result Value Range   Lactic Acid, Venous 1.1  0.5 - 2.2 mmol/L  GLUCOSE, CAPILLARY     Status: Abnormal   Collection Time    11/12/12  3:42 PM      Result Value Range   Glucose-Capillary 150 (*) 70 - 99 mg/dL  CULTURE, RESPIRATORY (NON-EXPECTORATED)     Status: None   Collection Time    11/12/12  7:51 PM      Result Value Range   Specimen Description TRACHEAL ASPIRATE     Special Requests NONE     Gram Stain       Value: MODERATE WBC PRESENT,BOTH PMN AND MONONUCLEAR     NO SQUAMOUS EPITHELIAL CELLS SEEN     RARE GRAM NEGATIVE RODS     RARE YEAST     Performed at [facility]   Culture PENDING     Report Status PENDING    GLUCOSE, CAPILLARY     Status: Abnormal   Collection Time    11/12/12  8:52 PM      Result Value Range   Glucose-Capillary 130 (*) 70 - 99 mg/dL  GLUCOSE, CAPILLARY     Status: Abnormal   Collection Time    11/12/12 11:27 PM      Result Value Range   Glucose-Capillary 115 (*) 70 - 99 mg/dL   Comment 1 Notify RN    GLUCOSE, CAPILLARY     Status: Abnormal   Collection Time    11/13/12  3:45 AM      Result Value Range   Glucose-Capillary 103 (*) 70 - 99 mg/dL   Comment 1 Notify RN    POCT I-STAT 3, BLOOD GAS (G3+)     Status: Abnormal   Collection Time    11/13/12  4:33 AM      Result Value Range   pH, Arterial 7.392  7.350 - 7.450   pCO2 arterial 29.6 (*) 35.0 - 45.0 mmHg   pO2, Arterial 123.0 (*) 80.0 - 100.0 mmHg   Bicarbonate 18.0 (*) 20.0 - 24.0 mEq/L   TCO2 19  0 - 100 mmol/L   O2 Saturation 99.0     Acid-base deficit 6.0 (*) 0.0 - 2.0 mmol/L   Patient temperature 98.3 F     Collection site ARTERIAL LINE     Drawn by Nurse     Sample type ARTERIAL    BASIC METABOLIC PANEL     Status: Abnormal   Collection Time    11/13/12  4:35 AM      Result Value Range   Sodium 144  135 - 145 mEq/L   Potassium 3.0 (*) 3.5 - 5.1 mEq/L   Chloride 116 (*) 96 - 112 mEq/L   CO2  20  19 - 32 mEq/L    Glucose, Bld 122 (*) 70 - 99 mg/dL   BUN 17  6 - 23 mg/dL   Creatinine, Ser 9.60  0.50 - 1.35 mg/dL   Calcium 8.1 (*) 8.4 - 10.5 mg/dL   GFR calc non Af Amer 69 (*) >90 mL/min   GFR calc Af Amer 80 (*) >90 mL/min   Comment: (NOTE)     The eGFR has been calculated using the CKD EPI equation.     This calculation has not been validated in all clinical situations.     eGFR's persistently <90 mL/min signify possible Chronic Kidney     Disease.  CBC     Status: Abnormal   Collection Time    11/13/12  4:35 AM      Result Value Range   WBC 11.0 (*) 4.0 - 10.5 K/uL   RBC 4.16 (*) 4.22 - 5.81 MIL/uL   Hemoglobin 13.4  13.0 - 17.0 g/dL   HCT 45.4  09.8 - 11.9 %   MCV 93.8  78.0 - 100.0 fL   MCH 32.2  26.0 - 34.0 pg   MCHC 34.4  30.0 - 36.0 g/dL   RDW 14.7  82.9 - 56.2 %   Platelets 128 (*) 150 - 400 K/uL  PROCALCITONIN     Status: None   Collection Time    11/13/12  4:35 AM      Result Value Range   Procalcitonin <0.10     Comment:            Interpretation:     PCT (Procalcitonin) <= 0.5 ng/mL:     Systemic infection (sepsis) is not likely.     Local bacterial infection is possible.     (NOTE)             ICU PCT Algorithm               Non ICU PCT Algorithm        ----------------------------     ------------------------------             PCT < 0.25 ng/mL                 PCT < 0.1 ng/mL         Stopping of antibiotics            Stopping of antibiotics           strongly encouraged.               strongly encouraged.        ----------------------------     ------------------------------           PCT level decrease by               PCT < 0.25 ng/mL           >= 80% from peak PCT           OR PCT 0.25 - 0.5 ng/mL          Stopping of antibiotics                                                 encouraged.         Stopping of antibiotics  encouraged.        ----------------------------     ------------------------------           PCT level decrease by               PCT >= 0.25 ng/mL           < 80% from peak PCT            AND PCT >= 0.5 ng/mL            Continuing antibiotics                                                  encouraged.           Continuing antibiotics                encouraged.        ----------------------------     ------------------------------         PCT level increase compared          PCT > 0.5 ng/mL             with peak PCT AND              PCT >= 0.5 ng/mL             Escalation of antibiotics                                              strongly encouraged.          Escalation of antibiotics            strongly encouraged.  GLUCOSE, CAPILLARY     Status: None   Collection Time    11/13/12  7:51 AM      Result Value Range   Glucose-Capillary 89  70 - 99 mg/dL  GLUCOSE, CAPILLARY     Status: None   Collection Time    11/13/12 12:16 PM      Result Value Range   Glucose-Capillary 98  70 - 99 mg/dL  GLUCOSE, CAPILLARY     Status: None   Collection Time    11/13/12  3:23 PM      Result Value Range   Glucose-Capillary 88  70 - 99 mg/dL    Recent Results (from the past 240 hour(s))  MRSA PCR SCREENING     Status: None   Collection Time    11/11/12  9:27 PM      Result Value Range Status   MRSA by PCR NEGATIVE  NEGATIVE Final   Comment:            The GeneXpert MRSA Assay (FDA     approved for NASAL specimens     only), is one component of a     comprehensive MRSA colonization     surveillance program. It is not     intended to diagnose MRSA     infection nor to guide or     monitor treatment for     MRSA infections.  URINE CULTURE     Status: None   Collection Time    11/11/12  9:27 PM      Result Value Range Status   Specimen  Description URINE, CATHETERIZED   Final   Special Requests ADDED [id number]   Final   Culture  Setup Time     Final   Value: 11/12/2012 13:02     Performed at [facility]   Colony Count     Final   Value: NO GROWTH     Performed at [facility]   Culture      Final   Value: NO GROWTH     Performed at [facility]   Report Status 11/13/2012 FINAL   Final  CULTURE, BLOOD (ROUTINE X 2)     Status: None   Collection Time    11/12/12  6:30 AM      Result Value Range Status   Specimen Description BLOOD RIGHT ANTECUBITAL   Final   Special Requests BOTTLES DRAWN AEROBIC AND ANAEROBIC   Final   Culture  Setup Time     Final   Value: 11/12/2012 11:21     Performed at [facility]   Culture     Final   Value:        BLOOD CULTURE RECEIVED NO GROWTH TO DATE CULTURE WILL BE HELD FOR 5 DAYS BEFORE ISSUING A FINAL NEGATIVE REPORT     Performed at [facility]   Report Status PENDING   Incomplete  CULTURE, BLOOD (ROUTINE X 2)     Status: None   Collection Time    11/12/12  6:50 AM      Result Value Range Status   Specimen Description BLOOD LEFT HAND   Final   Special Requests BOTTLES DRAWN AEROBIC ONLY 1CC   Final   Culture  Setup Time     Final   Value: 11/12/2012 11:21     Performed at [facility]   Culture     Final   Value:        BLOOD CULTURE RECEIVED NO GROWTH TO DATE CULTURE WILL BE HELD FOR 5 DAYS BEFORE ISSUING A FINAL NEGATIVE REPORT     Performed at [facility]   Report Status PENDING   Incomplete  CULTURE, RESPIRATORY (NON-EXPECTORATED)     Status: None   Collection Time    11/12/12  7:51 PM      Result Value Range Status   Specimen Description TRACHEAL ASPIRATE   Final   Special Requests NONE   Final   Gram Stain     Final   Value: MODERATE WBC PRESENT,BOTH PMN AND MONONUCLEAR     NO SQUAMOUS EPITHELIAL CELLS SEEN     RARE GRAM NEGATIVE RODS     RARE YEAST     Performed at [facility]   Culture PENDING   Incomplete   Report Status PENDING   Incomplete    Lipid Panel  Recent Labs  11/12/12 0410  CHOL 155  TRIG 104  HDL 35*  CHOLHDL 4.4  VLDL 21  LDLCALC 99    Studies/Results: Mr Brain Wo Contrast  11/12/2012   *RADIOLOGY REPORT*  Clinical Data: Altered mental  status.  Hypertension.  MRI HEAD WITHOUT CONTRAST  Technique:  Multiplanar, multiecho pulse sequences of the brain and surrounding structures were obtained according to standard protocol without intravenous contrast.  Comparison: 02/24/2011 MRI most recent.  Findings: An unusual and fairly symmetric pattern of restricted diffusion affects the anterior greater than posterior corpus callosum including the splenium, along with a subcentimeter foci of low-level diffusion restriction involving the bilateral frontal and parietal subcortical  white matter.  There is no involvement of the cerebral cortex.  A similar pattern can be seen on previous MR from July and December 2012, and likely  similar involvement of the left splenium in January 2008. 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  Advanced atrophy with chronic microvascular ischemic change.  No hemorrhage.  No mass lesion or hydrocephalus.  No extra-axial fluid.  No evidence for posterior circulation vasogenic edema to suggest hypertensive encephalopathy.  Flow voids are maintained in the major intracranial vascular structures.  Heavy mineralization of the dentate nuclei is noted.  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  No acute orbital findings.  Bilateral sinus disease most notable on the left maxillary region.  No mastoid fluid.  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  The pattern of diffusion and T2 signal abnormality is not suggestive of an ischemic process.  Advanced atrophy and chronic microvascular ischemic change.  There are no features suggestive of hypertensive encephalopathy.   Original Report Authenticated By: Davonna Belling, M.D.   Dg Chest  Port 1 View  11/13/2012   *RADIOLOGY REPORT*  Clinical Data: Respiratory failure.  Atelectasis.  PORTABLE CHEST - 1 VIEW  Comparison: Chest 11/11/2012 and 11/12/2012.  Findings: Support tubes and lines are unchanged.  Left basilar airspace disease shows some worsening.  The right lung remains clear.  No pneumothorax identified.  There is likely a small left effusion.  IMPRESSION:  Some increase in left basilar airspace disease and small effusion. No other change.   Original Report Authenticated By: Holley Dexter, M.D.   Dg Chest Port 1 View  11/12/2012   *RADIOLOGY REPORT*  Clinical Data: Right CVL placement  PORTABLE CHEST - 1 VIEW  Comparison: 11/12/2012 at 0733 hours  Findings: Endotracheal tube terminates 5.5 cm above the carina.  Enteric tube courses below the diaphragm.  Interval placement of a right IJ venous catheter which terminates at the cavoatrial junction.  Mild left basilar opacity, likely reflecting a trace left pleural effusion with associated atelectasis.  No pneumothorax.  The heart is normal in size.  Atrial closure device.  Additional line/leads overlying the heart.  IMPRESSION: Interval placement of a right IJ venous catheter which terminates at the cavoatrial junction.  No pneumothorax.   Original Report Authenticated By: Charline Bills, M.D.   Dg Chest Port 1 View  11/12/2012   *RADIOLOGY REPORT*  Clinical Data: To evaluate ET tube position, lung fields.  PORTABLE CHEST - 1 VIEW  Comparison: 11/11/2012  Findings: Endotracheal tube and NG tube are unchanged.  Heart is upper limits normal in size.  Slight increasing interstitial prominence within the lungs, right greater than left.  Question interstitial edema.  No confluent opacities.  No effusions or acute bony abnormality.  IMPRESSION: Slight increasing interstitial prominence, right slightly greater than left.  Question early interstitial edema.   Original Report Authenticated By: Charlett Nose, M.D.   Dg Chest Port 1  View  11/11/2012   CLINICAL DATA:  Post intubation.  EXAM: PORTABLE CHEST - 1 VIEW  COMPARISON:  04/09/2012  FINDINGS: Endotracheal tube is 4.6 cm above the chronic. NG tube enters the stomach. Lungs are clear. Heart is normal size. No effusions.  IMPRESSION: Endotracheal tube 4.6 cm above the chronic.  No active disease.   Electronically Signed   By: Charlett Nose   On: 11/11/2012 20:25    Medications:  I have reviewed the patient's current medications. Scheduled: . antiseptic oral rinse  1 application Mouth Rinse QID  . aspirin  300 mg Rectal Daily  . chlorhexidine  15 mL Mouth/Throat BID  . heparin subcutaneous  5,000 Units Subcutaneous Q8H  . insulin aspart  0-15 Units Subcutaneous Q4H  . levETIRAcetam  500 mg Intravenous Q12H  . levothyroxine  50 mcg Intravenous QAC breakfast  . pantoprazole (PROTONIX) IV  40 mg Intravenous Q12H  . piperacillin-tazobactam (ZOSYN)  IV  3.375 g Intravenous Q8H  . sodium chloride  3 mL Intravenous Q12H  . vancomycin  750 mg Intravenous Q12H    Assessment/Plan: Patient seems to have returned to baseline. Has had multiple similar events in the past. Currently on Keppra and tolerating well. MRI reviewed and has changes consistent with seizure.   Recommendations:  1. Continue Keppra at current dose. Would change to po once patient able to take po. Dosing is 1:1  2. Continue seizure precautions.    LOS: 2 days   Thana Farr, MD [facility] [phone] 11/13/2012  3:58 PM

## 2012-11-13 NOTE — Evaluation (Addendum)
Clinical/Bedside Swallow Evaluation Patient Details  Name: WHEELER INCORVAIA MRN: [id number] Date of Birth: 18-Mar-1941  Today's Date: 11/13/2012 Time: 2130-8657 SLP Time Calculation (min): 1402 min  Past Medical History:  Past Medical History  Diagnosis Date  . Hypertension   . Hx of transient ischemic attack (TIA)   . Hyperlipidemia   . Coronary artery disease   . Throat cancer     radiation therapy  . Hypertensive encephalopathy   . ED (erectile dysfunction)   . Personal history of kidney stones   . Subarachnoid hemorrhage   . Sleep apnea     on CPAP  . Stroke   . Myocardial infarction     12/1991  . Headache(784.0)   . Dysphagia   . Hypokalemia   . AKI (acute kidney injury) 02/23/2011  . Hypothyroidism   . Unspecified cerebral artery occlusion with cerebral infarction 11/08/2012   Past Surgical History:  Past Surgical History  Procedure Laterality Date  . Angioplasty  1993    LAD  . Cardiac catheterization    . Coronary angioplasty    . Basal cell cancer      head  . Hernia repair    . Appendectomy    . Patent foramen ovale closure  2008  . Eye surgery    . Colonoscopy N/A 06/18/2012    Procedure: COLONOSCOPY;  Surgeon: Petra Kuba, MD;  Location: [facility] ENDOSCOPY;  Service: Endoscopy;  Laterality: N/A;  h&p in file-[facility]   . Hot hemostasis N/A 06/18/2012    Procedure: HOT HEMOSTASIS (ARGON PLASMA COAGULATION/BICAP);  Surgeon: Petra Kuba, MD;  Location: [facility] ENDOSCOPY;  Service: Endoscopy;  Laterality: N/A;   HPI:  72 yo with past medical history of TIA (2011) and SAH (2011) as well as intermittent episodes of encephalopathy associated with hypertension since 2003 brought to ED with altered mental status and hypertension. In ED: developed emesis - intubated for airway protection. CT revealed no acute findings.  MRI revealed Unusual pattern of restricted diffusion is most notable in the corpus callosum. 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 The pattern of diffusion  and T2 signal abnormality is not suggestive of an ischemic process. Advanced atrophy and chronic microvascular ischemic change.  CXR Some increase in left basilar airspace disease and small effusion. No other change.  Wife reports pt. did not follow recommendations of honey thick liquid once discharged in 2012 and that he coughs "almost every time he drinks".  He has not had pna in the past two years.   Assessment / Plan / Recommendation Clinical Impression  Pt. with hoarse vocal quality and low intensity s/p ETT x 3 days.  Significant signs of aspiration with thin water due to immediate cough and delayed cough after puree consistency.  Pt. likely experiencing a combination of chronic and acute dysphagia following extubation.  Recommend pt. have an MBS tomorrow (also history of esophageal deficits), remain NPO with oral care.  MD please order MBS if agree.    Aspiration Risk  Severe    Diet Recommendation NPO        Other  Recommendations Recommended Consults: MBS Oral Care Recommendations: Oral care BID   Follow Up Recommendations   (TBD)    Frequency and Duration min 2x/week  2 weeks   Pertinent Vitals/Pain none    SLP Swallow Goals Patient will utilize recommended strategies during swallow to increase swallowing safety with: Minimal cueing   Swallow Study         Oral/Motor/Sensory Function Overall  Oral Motor/Sensory Function: Appears within functional limits for tasks assessed   Ice Chips Ice chips: Not tested   Thin Liquid Thin Liquid: Impaired Presentation: Cup Pharyngeal  Phase Impairments: Cough - Immediate;Wet Vocal Quality    Nectar Thick Nectar Thick Liquid: Not tested   Honey Thick Honey Thick Liquid: Not tested   Puree Puree: Impaired Pharyngeal Phase Impairments: Cough - Delayed   Solid       Solid: Not tested       Royce Macadamia M.Ed [employer] [phone]  11/13/2012

## 2012-11-13 NOTE — Procedures (Signed)
Extubation Procedure Note  Patient Details:   Name: Benjamin Park DOB: April 16, 1940 MRN: [id number]   Airway Documentation:     Evaluation  O2 sats: stable throughout Complications: No apparent complications Patient did tolerate procedure well. Bilateral Breath Sounds: Rhonchi;Diminished Suctioning: Airway Yes  Ave Filter 11/13/2012, 8:49 AM

## 2012-11-14 ENCOUNTER — Inpatient Hospital Stay (HOSPITAL_COMMUNITY): Payer: Medicare Other

## 2012-11-14 DIAGNOSIS — E785 Hyperlipidemia, unspecified: Secondary | ICD-10-CM

## 2012-11-14 LAB — BASIC METABOLIC PANEL
Chloride: 110 mEq/L (ref 96–112)
Creatinine, Ser: 1.02 mg/dL (ref 0.50–1.35)
GFR calc Af Amer: 83 mL/min — ABNORMAL LOW (ref >90)
Sodium: 143 mEq/L (ref 135–145)

## 2012-11-14 LAB — PROCALCITONIN: Procalcitonin: <0.1 ng/mL

## 2012-11-14 LAB — CBC
HCT: 37.9 % — ABNORMAL LOW (ref 39.0–52.0)
Platelets: 122 10*3/uL — ABNORMAL LOW (ref 150–400)
RDW: 13.1 % (ref 11.5–15.5)
WBC: 8.7 10*3/uL (ref 4.0–10.5)

## 2012-11-14 LAB — GLUCOSE, CAPILLARY: Glucose-Capillary: 84 mg/dL (ref 70–99)

## 2012-11-14 MED ORDER — ENSURE PUDDING PO PUDG
1.0000 | Freq: Three times a day (TID) | ORAL | Status: DC
  Administered 2012-11-14 – 2012-11-19 (×14): 1 via ORAL

## 2012-11-14 MED ORDER — FLUCONAZOLE 40 MG/ML PO SUSR
100.0000 mg | Freq: Every day | ORAL | Status: DC
  Filled 2012-11-14: qty 2.5

## 2012-11-14 MED ORDER — ADULT MULTIVITAMIN W/MINERALS CH
1.0000 | ORAL_TABLET | Freq: Every day | ORAL | Status: DC
  Administered 2012-11-14 – 2012-11-19 (×6): 1 via ORAL
  Filled 2012-11-14 (×6): qty 1

## 2012-11-14 MED ORDER — PNEUMOCOCCAL VAC POLYVALENT 25 MCG/0.5ML IJ INJ
0.5000 mL | INJECTION | INTRAMUSCULAR | Status: AC
  Filled 2012-11-14: qty 0.5

## 2012-11-14 MED ORDER — ASPIRIN 325 MG PO TABS
325.0000 mg | ORAL_TABLET | Freq: Every day | ORAL | Status: DC
  Administered 2012-11-14 – 2012-11-19 (×6): 325 mg via ORAL
  Filled 2012-11-14 (×6): qty 1

## 2012-11-14 MED ORDER — STARCH (THICKENING) PO POWD
ORAL | Status: DC | PRN
  Filled 2012-11-14 (×2): qty 227

## 2012-11-14 MED ORDER — LABETALOL HCL 100 MG PO TABS
100.0000 mg | ORAL_TABLET | Freq: Two times a day (BID) | ORAL | Status: DC
  Administered 2012-11-14 – 2012-11-15 (×3): 100 mg via ORAL
  Filled 2012-11-14 (×4): qty 1

## 2012-11-14 MED ORDER — VANCOMYCIN HCL IN DEXTROSE 1-5 GM/200ML-% IV SOLN
1000.0000 mg | Freq: Two times a day (BID) | INTRAVENOUS | Status: DC
  Filled 2012-11-14 (×2): qty 200

## 2012-11-14 MED ORDER — LEVOTHYROXINE SODIUM 100 MCG PO TABS
100.0000 ug | ORAL_TABLET | Freq: Every day | ORAL | Status: DC
  Administered 2012-11-15 – 2012-11-19 (×5): 100 ug via ORAL
  Filled 2012-11-14 (×6): qty 1

## 2012-11-14 MED ORDER — POTASSIUM CHLORIDE 10 MEQ/50ML IV SOLN
10.0000 meq | INTRAVENOUS | Status: AC
Start: 2012-11-14 — End: 2012-11-14
  Administered 2012-11-14 (×4): 10 meq via INTRAVENOUS
  Filled 2012-11-14: qty 200

## 2012-11-14 MED ORDER — SIMVASTATIN 40 MG PO TABS
40.0000 mg | ORAL_TABLET | Freq: Every day | ORAL | Status: DC
  Administered 2012-11-14 – 2012-11-18 (×5): 40 mg via ORAL
  Filled 2012-11-14 (×6): qty 1

## 2012-11-14 MED ORDER — FLUCONAZOLE 40 MG/ML PO SUSR
100.0000 mg | Freq: Every day | ORAL | Status: DC
  Administered 2012-11-14 – 2012-11-19 (×6): 100 mg via ORAL
  Filled 2012-11-14 (×6): qty 2.5

## 2012-11-14 MED ORDER — IRBESARTAN 150 MG PO TABS
150.0000 mg | ORAL_TABLET | Freq: Every day | ORAL | Status: DC
  Administered 2012-11-14 – 2012-11-19 (×6): 150 mg via ORAL
  Filled 2012-11-14 (×6): qty 1

## 2012-11-14 NOTE — Progress Notes (Signed)
Called at 1835 by ICU RN to check patient he had transferred to [facility] earlier in the shift.  Patient was admitted with HTN encephalopathy with word finding issues.  This resolved with BP control.  Wife of patient called the ICU RN to report her husband was again having the word finding issues.  ICU RN Thayer Ohm spoke with RN on Coralie Keens and suggested she give the prn Labetalol as his BP was 180/105.  On my arrival patient was supine in bed - alert w/d- speaking clearly - answering most questions without problem - did have few episodes of difficulty word finding but wife and RN Inetta Fermo states it is much better now with BP back down to 145/78.  RN TIna states Thayer Ohm RN from 2100 was calling Dr. Vassie Loll - i advised Inetta Fermo to call Thayer Ohm to see if he spoke with Dr. Vassie Loll and any recommendations - to make sure that Dr. Vassie Loll was alerted.  This RN went to another RRT call - on follow up with night shift - patient back to baseline.  BP stable.

## 2012-11-14 NOTE — Progress Notes (Signed)
eLink Nursing ICU Electrolyte Replacement Protocol  Patient Name: Benjamin Park DOB: 12-31-1940 MRN: [id number]  Date of Service  11/14/2012   HPI/Events of Note    Recent Labs Lab 11/11/12 1546 11/11/12 1556 11/12/12 0410 11/13/12 0435 11/14/12 0500  NA 141 143 140 144 143  K 3.4* 3.5 3.2* 3.0* 3.1*  CL 105 105 104 116* 110  CO2 27  --  24 20 24   GLUCOSE 119* 123* 146* 122* 95  BUN 23 23 26* 17 10  CREATININE 1.17 1.30 1.24 1.06 1.02  CALCIUM 9.4  --  8.8 8.1* 8.5    Estimated Creatinine Clearance: 74 ml/min (by C-G formula based on Cr of 1.02).  Intake/Output     08/27 0701 - 08/28 0700   I.V. (mL/kg) 979.6 (12.1)   NG/GT 30   IV Piggyback 822.5   Total Intake(mL/kg) 1832.1 (22.6)   Urine (mL/kg/hr) 2520 (1.3)   Total Output 2520   Net -687.9        - I/O DETAILED x24h    Total I/O In: 817.5 [I.V.:500; IV Piggyback:317.5] Out: 1525 [Urine:1525] - I/O THIS SHIFT    ASSESSMENT   eICURN Interventions  K+ Electrolyte protocol criteria met. Electrolytes replaced per protocol. MD notified.    ASSESSMENT: MAJOR ELECTROLYTE    Merita Norton 11/14/2012, 6:08 AM

## 2012-11-14 NOTE — Procedures (Signed)
Objective Swallowing Evaluation: Modified Barium Swallowing Study  Patient Details  Name: Benjamin Park MRN: [id number] Date of Birth: [birth year]  Today's Date: 11/14/2012 Time: 9629-5284 SLP Time Calculation (min): 15 min  Past Medical History:  Past Medical History  Diagnosis Date  . Hypertension   . Hx of transient ischemic attack (TIA)   . Hyperlipidemia   . Coronary artery disease   . Throat cancer     radiation therapy  . Hypertensive encephalopathy   . ED (erectile dysfunction)   . Personal history of kidney stones   . Subarachnoid hemorrhage   . Sleep apnea     on CPAP  . Stroke   . Myocardial infarction     12/1991  . Headache(784.0)   . Dysphagia   . Hypokalemia   . AKI (acute kidney injury) 02/23/2011  . Hypothyroidism   . Unspecified cerebral artery occlusion with cerebral infarction 11/08/2012   Past Surgical History:  Past Surgical History  Procedure Laterality Date  . Angioplasty  1993    LAD  . Cardiac catheterization    . Coronary angioplasty    . Basal cell cancer      head  . Hernia repair    . Appendectomy    . Patent foramen ovale closure  2008  . Eye surgery    . Colonoscopy N/A 06/18/2012    Procedure: COLONOSCOPY;  Surgeon: Petra Kuba, MD;  Location: [facility] ENDOSCOPY;  Service: Endoscopy;  Laterality: N/A;  h&p in file-[facility]   . Hot hemostasis N/A 06/18/2012    Procedure: HOT HEMOSTASIS (ARGON PLASMA COAGULATION/BICAP);  Surgeon: Petra Kuba, MD;  Location: [facility] ENDOSCOPY;  Service: Endoscopy;  Laterality: N/A;   HPI:  72 yo with past medical history of TIA (2011) and SAH (2011) as well as intermittent episodes of encephalopathy associated with hypertension since 2003 brought to ED with altered mental status and hypertension. In ED: developed emesis - intubated for airway protection. CT revealed no acute findings.  MRI revealed Unusual pattern of restricted diffusion is most notable in the corpus callosum. I believe this relates to epileptiform  activity. The pattern of diffusion and T2 signal abnormality is not suggestive of an ischemic process. Advanced atrophy and chronic microvascular ischemic change.  CXR Some increase in left basilar airspace disease and small effusion. No other change.  Wife reports pt. did not follow recommendations of honey thick liquid once discharged in 2012 and that he coughs "almost every time he drinks".  He has not had pna in the past two years.  MBS recommended.     Assessment / Plan / Recommendation Clinical Impression  Dysphagia Diagnosis: Severe pharyngeal phase dysphagia;Mild oral phase dysphagia Clinical impression: Mild-severe oral and pharyngeal dysphagia based on impairments of motor tract (mild sensory involvement) with history of chronic dysphagia exacerbated by 3-4 day intubation.   Poor tongue base retraction and laryngeal elevation led to copious vallecular residue/mild pyriform sinus residue resulting in frank aspiration with tsp honey thick barium (delayed cough).  Therapeutic intervention included chin tuck posture which increased aspiration.  Prognosis for improvement for return to baseline is good as edema and irritation of pharynx/larynx decreases.  Recommend a Dys 1 diet (difficulty masticating and premature spill while masticating) and NO liquids (pudding thick drinks).  SLP will follow.      Treatment Recommendation  Therapy as outlined in treatment plan below    Diet Recommendation Dysphagia 1 (Puree);No liquids (pudding thick drinks)   Liquid Administration via: Spoon Medication Administration: Crushed  with puree Supervision: Patient able to self feed;Intermittent supervision to cue for compensatory strategies Compensations: Slow rate;Small sips/bites;Multiple dry swallows after each bite/sip;Clear throat intermittently Postural Changes and/or Swallow Maneuvers: Seated upright 90 degrees    Other  Recommendations Oral Care Recommendations: Oral care BID   Follow Up  Recommendations   (TBD)    Frequency and Duration min 2x/week  2 weeks   Pertinent Vitals/Pain none    SLP Swallow Goals Patient will utilize recommended strategies during swallow to increase swallowing safety with: Minimal cueing      Reason for Referral Objectively evaluate swallowing function   Oral Phase Oral Preparation/Oral Phase Oral Phase: Impaired Oral - Solids Oral - Regular: Weak lingual manipulation;Delayed oral transit   Pharyngeal Phase Pharyngeal Phase Pharyngeal Phase: Impaired Pharyngeal - Honey Pharyngeal - Honey Teaspoon: Penetration/Aspiration during swallow;Penetration/Aspiration after swallow;Reduced tongue base retraction;Reduced airway/laryngeal closure;Reduced laryngeal elevation;Reduced anterior laryngeal mobility;Pharyngeal residue - valleculae;Pharyngeal residue - pyriform sinuses Penetration/Aspiration details (honey teaspoon): Material enters airway, passes BELOW cords and not ejected out despite cough attempt by patient (delayed cough) Pharyngeal - Solids Pharyngeal - Puree: Reduced anterior laryngeal mobility;Reduced laryngeal elevation;Reduced tongue base retraction;Pharyngeal residue - valleculae;Pharyngeal residue - pyriform sinuses  Cervical Esophageal Phase    GO    Cervical Esophageal Phase Cervical Esophageal Phase: [facility]         Darrow Bussing.Ed [employer] [phone]  11/14/2012

## 2012-11-14 NOTE — Progress Notes (Signed)
Rehab Admissions Coordinator Note:  Patient was screened by Brock Ra for appropriateness for an Inpatient Acute Rehab Consult.  At this time, we are recommending Inpatient Rehab consult.  Brock Ra 11/14/2012, 5:40 PM  I can be reached at [phone].

## 2012-11-14 NOTE — Progress Notes (Signed)
INITIAL NUTRITION ASSESSMENT  DOCUMENTATION CODES Per approved criteria  -Not Applicable   INTERVENTION: Add Ensure Pudding po TID, each supplement provides 170 kcal and 4 grams of protein.  Continue Magic cup TID between meals, each supplement provides 290 kcal and 9 grams of protein. RD to continue to follow nutrition care plan.  NUTRITION DIAGNOSIS: Swallowing difficulty related to acute-on-chronic dysphagia as evidenced by MBSS results.   Goal: Intake to meet >90% of estimated nutrition needs.  Monitor:  weight trends, lab trends, I/O's, PO intake, supplement tolerance  Reason for Assessment: Low Braden Score  72 y.o. male  Admitting Dx: Hypertensive encephalopathy  ASSESSMENT: PMHx significant for HTN, TIA, subarachnoid hemorrhage, CAD, MI. Admitted with confusion and hypertension. Work-up reveals hypertensive encephalopathy.  Pt became more agitated and was found to be vomiting brown secretions on admission with possible seizure. Intubated 8/25. EEG on 8/26 was normal. MRI revealed changes consistent with seizure. Extubated 8/27.  Underwent BSE by SLP on 8/27 with recommendations for MBSS 2/2 pt with acute-on-chronic dysphagia. Underwent MBSS this morning with recommendations for Dysphagia 1 diet with no liquids (Pudding-Thick only.) SLP notes that pt has good prognosis for improvement for return to baseline and edema/irritation of pharynx/larynx decreases.  RD discussed oral intake with patient and wife. Wife reports that pt had a good appetite PTA, and was eating well with no significant weight changes. She states that he consumes a Heart Healthy diet - low sodium and low fat. She states that approximately 3 years ago he was discharged from the hospital with orders for thickened liquids and that he was compliant for approximately 1 week and then he stopped thickening his liquids. She confirms that he "seems to be choking" every time he consumes thin liquids.  Currently  ordered for D5 with 1/2 NS with KCl 20 mEq/L at 50 ml/hr.  Height: Ht Readings from Last 1 Encounters:  11/12/12 6' 0.5" (1.842 m)    Weight: Wt Readings from Last 1 Encounters:  11/13/12 179 lb 0.2 oz (81.2 kg)    Ideal Body Weight: 178 lb  % Ideal Body Weight: 100%  Wt Readings from Last 10 Encounters:  11/13/12 179 lb 0.2 oz (81.2 kg)  11/08/12 185 lb (83.915 kg)  07/15/12 180 lb 12.8 oz (82.01 kg)  06/18/12 177 lb (80.287 kg)  06/18/12 177 lb (80.287 kg)  12/25/11 178 lb 9.6 oz (81.012 kg)  04/10/11 180 lb 6.4 oz (81.829 kg)  03/20/11 172 lb 9.9 oz (78.3 kg)  02/26/11 172 lb 2.9 oz (78.1 kg)  09/26/10 179 lb 3.2 oz (81.285 kg)    Usual Body Weight: 177 - 180 lb  % Usual Body Weight: 100%  BMI:  Body mass index is 23.93 kg/(m^2). WNL  Estimated Nutritional Needs: Kcal: 1650 - 1900 Protein: 80 - 90 g Fluid: 1.7 - 1.9 liters  Skin: intact  Diet Order: NPO  EDUCATION NEEDS: -No education needs identified at this time   Intake/Output Summary (Last 24 hours) at 11/14/12 1048 Last data filed at 11/14/12 1045  Gross per 24 hour  Intake 1906.33 ml  Output   2680 ml  Net -773.67 ml    Last BM: PTA   Labs:   Recent Labs Lab 11/12/12 0410 11/13/12 0435 11/14/12 0500  NA 140 144 143  K 3.2* 3.0* 3.1*  CL 104 116* 110  CO2 24 20 24   BUN 26* 17 10  CREATININE 1.24 1.06 1.02  CALCIUM 8.8 8.1* 8.5  GLUCOSE 146* 122* 95  CBG (last 3)   Recent Labs  11/13/12 1914 11/13/12 2327 11/14/12 0328  GLUCAP 78 93 84    Scheduled Meds: . antiseptic oral rinse  1 application Mouth Rinse QID  . chlorhexidine  15 mL Mouth/Throat BID  . heparin subcutaneous  5,000 Units Subcutaneous Q8H  . levETIRAcetam  500 mg Intravenous Q12H  . levothyroxine  50 mcg Intravenous QAC breakfast  . [START ON 11/15/2012] pneumococcal 23 valent vaccine  0.5 mL Intramuscular Tomorrow-1000  . sodium chloride  3 mL Intravenous Q12H    Continuous Infusions: . dextrose 5 %  and 0.45 % NaCl with KCl 20 mEq/L 50 mL/hr at 11/13/12 1610    Past Medical History  Diagnosis Date  . Hypertension   . Hx of transient ischemic attack (TIA)   . Hyperlipidemia   . Coronary artery disease   . Throat cancer     radiation therapy  . Hypertensive encephalopathy   . ED (erectile dysfunction)   . Personal history of kidney stones   . Subarachnoid hemorrhage   . Sleep apnea     on CPAP  . Stroke   . Myocardial infarction     12/1991  . Headache(784.0)   . Dysphagia   . Hypokalemia   . AKI (acute kidney injury) 02/23/2011  . Hypothyroidism   . Unspecified cerebral artery occlusion with cerebral infarction 11/08/2012    Past Surgical History  Procedure Laterality Date  . Angioplasty  1993    LAD  . Cardiac catheterization    . Coronary angioplasty    . Basal cell cancer      head  . Hernia repair    . Appendectomy    . Patent foramen ovale closure  2008  . Eye surgery    . Colonoscopy N/A 06/18/2012    Procedure: COLONOSCOPY;  Surgeon: Petra Kuba, MD;  Location: [facility] ENDOSCOPY;  Service: Endoscopy;  Laterality: N/A;  h&p in file-[facility]   . Hot hemostasis N/A 06/18/2012    Procedure: HOT HEMOSTASIS (ARGON PLASMA COAGULATION/BICAP);  Surgeon: Petra Kuba, MD;  Location: [facility] ENDOSCOPY;  Service: Endoscopy;  Laterality: N/A;    Jarold Motto MS, RD, LDN Pager: [phone] After-hours pager: [phone]

## 2012-11-14 NOTE — Progress Notes (Signed)
Speech Language Pathology  Patient Details Name: Benjamin Park MRN: [id number] DOB: January 06, 1941 Today's Date: 11/14/2012 Time:  -    Full report will be documented.  Aspiration with honey thick liquids. Recommend:  Dys 1 diet, no liquids (a.k.a. Pudding thick liquids)  Breck Coons Lonell Face.Ed [employer] [phone]  11/14/2012

## 2012-11-14 NOTE — Progress Notes (Signed)
PULMONARY  / CRITICAL CARE MEDICINE  Name: Benjamin Park MRN: [id number] DOB: 1941-01-19    ADMISSION DATE:  11/11/2012 CONSULTATION DATE:  11/11/2012  REFERRING MD :  [facility] PRIMARY SERVICE:  PCCM  CHIEF COMPLAINT:  Acute encephalopathy  BRIEF PATIENT DESCRIPTION: 72 yo with past medical history of TIA (2011) and SAH (2011) as well as intermittent episodes of encephalopathy associated with hypertension since 2003  brought to ED with altered mental status and hypertension.  In ED: developed emesis - intubated for airway protection.  CT revealed no acute findings.  Seizures were suspected and Keppra given.  PCCM was consulted.  SIGNIFICANT EVENTS: 8/25  Head CT >>> atrophy, chronic ischemic changes, Lt maxillary sinus disease 8/26  MRI head >> unusual pattern restricted to corpus callosum 8/26  EEG >> generalized slowing consistent with normal sleep, no epileptiform activity 8/28  Barium swallow >>> Severe pharyngeal phase dysphagia, oral phase dysphagia 8/28  Downgraded to telemetry under TRH  LINES / TUBES: ETT 8/25 >>> 8/27 A line 8/26 >>> 8/27 CVL 8/26 >>>  CULTURES: Sputum 8/26 >>> Urine 8/26 >>> neg Blood 8/26 >>>  ANTIBIOTICS: Vancomycin 8/26 >>> 8/28 Zosyn 8/26 >>> 8/28  INTERVAL HISTORY:  VITAL SIGNS: Temp:  [97.6 F (36.4 C)-98.4 F (36.9 C)] 98.4 F (36.9 C) (08/28 0740) Pulse Rate:  [63-97] 73 (08/28 0800) Resp:  [0-22] 17 (08/28 0800) BP: (111-185)/(73-127) 144/99 mmHg (08/28 0800) SpO2:  [97 %-100 %] 97 % (08/28 0800) Arterial Line BP: (123-146)/(68-82) 123/68 mmHg (08/27 1000)  HEMODYNAMICS: CVP:  [6 mmHg] 6 mmHg  VENTILATOR SETTINGS:    INTAKE / OUTPUT: Intake/Output     08/27 0701 - 08/28 0700 08/28 0701 - 08/29 0700   I.V. (mL/kg) 1079.6 (13.3) 50 (0.6)   NG/GT 30    IV Piggyback 885 50   Total Intake(mL/kg) 1994.6 (24.6) 100 (1.2)   Urine (mL/kg/hr) 2760 (1.4) 120 (0.7)   Emesis/NG output     Total Output 2760 120   Net -765.4 -20         PHYSICAL EXAMINATION: General: Comfortable  Neuro: Awake, alert cooperative HEENT: PERRL, oral thrush Cardiovascular: RRR, no murmurs Lungs: Diminished bilateral air entry  Abdomen:  Soft, nontender, bowel sounds normoactive Musculoskeletal: No edema Skin: No rashes  LABS:  Recent Labs Lab 11/11/12 1546 11/11/12 1556 11/11/12 2008 11/12/12 0410 11/12/12 0500 11/12/12 0637 11/12/12 1223 11/13/12 0433 11/13/12 0435 11/14/12 0500  HGB 17.0 16.7  --  15.7 16.0  --   --   --  13.4 13.8  WBC 7.2  --   --  14.2* 14.5*  --   --   --  11.0* 8.7  PLT 170  --   --  171 165  --   --   --  128* 122*  NA 141 143  --  140  --   --   --   --  144 143  K 3.4* 3.5  --  3.2*  --   --   --   --  3.0* 3.1*  CL 105 105  --  104  --   --   --   --  116* 110  CO2 27  --   --  24  --   --   --   --  20 24  GLUCOSE 119* 123*  --  146*  --   --   --   --  122* 95  BUN 23 23  --  26*  --   --   --   --  17 10  CREATININE 1.17 1.30  --  1.24  --   --   --   --  1.06 1.02  CALCIUM 9.4  --   --  8.8  --   --   --   --  8.1* 8.5  AST 18  --   --   --   --   --   --   --   --   --   ALT 15  --   --   --   --   --   --   --   --   --   ALKPHOS 68  --   --   --   --   --   --   --   --   --   BILITOT 0.6  --   --   --   --   --   --   --   --   --   PROT 7.3  --   --   --   --   --   --   --   --   --   ALBUMIN 3.9  --   --   --   --   --   --   --   --   --   INR 1.05  --   --   --   --   --   --   --   --   --   APTT 36  --   --   --   --   --   --   --   --   --   LATICACIDVEN  --   --   --   --   --  1.2 1.1  --   --   --   TROPONINI <0.30  --   --   --   --   --   --   --   --   --   PHART  --   --  7.374  --   --   --   --  7.392  --   --   PCO2ART  --   --  40.2  --   --   --   --  29.6*  --   --   PO2ART  --   --  124.0*  --   --   --   --  123.0*  --   --   HCO3  --   --  23.5  --   --   --   --  18.0*  --   --   O2SAT  --   --  99.0  --   --   --   --  99.0  --   --    CXR:  8/28 >>>  Persistent LLL atelectasis  ASSESSMENT / PLAN:  PULMONARY A: Acute respiratory failure secondary to mental status change - resolved. LLL atelectasis. OSA. P:   Goal SpO2>92 Supplemental oxygen PRN Refuses CPAP for now  CARDIOVASCULAR A: HTN emergency - resolved.  Dyslipidemia. P:  Restart preadmission Labetalol, Benicar, ASA, Zocor Labetalol PRN  RENAL A: CKD. Hypokalemia.  P:   Trend BMP K replacement per protocol  IVF D5 1/2 NS 20K @ 50  GASTROINTESTINAL A: Dysphagia. P:   D/c Protonix (GI Px is not indicated) Start dysphagia 1 diey  HEMATOLOGIC A: No active issues P:  Trend CBC Heparin for  DVT Px  INFECTIOUS A: No evidence of acute infection. P:   D/c abx Add Diflucan for oral candidiasis x 14 days Follow for fever / leukocytosis  ENDOCRINE  A:  Hypothyroidism.  Hypoglycemia. P:   Synthroid, change to PO D/s SSI Continue CBG q4h  NEUROLOGIC A: Acute encephalopathy, assumed hypertensive - now resolved.  Question of seizure disorder with MRI findings which are consistent with this diagnosis . Hx of recurrent TIA's, SAH. P:   ASA when able Keppra  Transfer to telemetry floor under TRH.  I have personally obtained a history, examined the patient, evaluated laboratory and imaging results, formulated the assessment and plan and placed orders.  Lonia Farber, MD Pulmonary and Critical Care Medicine [facility] Pager: [phone]  11/14/2012, 9:24 AM

## 2012-11-14 NOTE — Progress Notes (Signed)
Speech Language Pathology  Patient Details Name: Benjamin Park MRN: [id number] DOB: 1941/03/10 Today's Date: 11/14/2012 Time:  -     MBS scheduled for this morning at 0930  [facility] [place] M.Ed [employer] [phone]  11/14/2012

## 2012-11-14 NOTE — Evaluation (Signed)
Physical Therapy Evaluation Patient Details Name: Benjamin Park MRN: [id number] DOB: 06/19/1940 Today's Date: 11/14/2012 Time: 6962-9528 PT Time Calculation (min): 14 min  PT Assessment / Plan / Recommendation History of Present Illness  72 y.o. male admitted to [facility] on 11/11/12 with past medical history of TIA (2011) and SAH (2011) as well as intermittent episodes of encephalopathy associated with hypertension since 2003 brought to ED with altered mental status and hypertension. In ED: developed emesis - intubated for airway protection. CT revealed no acute findings.  MRI revealed Unusual pattern of restricted diffusion is most notable in the corpus callosum. 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 The pattern of diffusion and T2 signal abnormality is not suggestive of an ischemic process. Advanced atrophy and chronic microvascular ischemic change.  CXR Some increase in left basilar airspace disease and small effusion.  Pt extubated 11/13/12.    Clinical Impression  Pt is very unsteady on his feet and is at high risk for falls right now without hands-on moderate physical assist.  He is a very good candidate for inpatient rehab and is open to this option despite verbalizing that he wants to go home today.  Per chart, wife reports multiple falls at home.  He would benefit from walking with a RW for support as long as he can demonstrate safe use of the AD.     PT Assessment  Patient needs continued PT services    Follow Up Recommendations  CIR    Does the patient have the potential to tolerate intense rehabilitation     Yes  Barriers to Discharge   None None    Equipment Recommendations  Rolling walker with 5" wheels    Recommendations for Other Services Rehab consult   Frequency Min 3X/week    Precautions / Restrictions Precautions Precautions: Fall Precaution Comments: chart states that wife report frequent fall   Pertinent Vitals/Pain See vitals flow sheet.       Mobility  Bed Mobility Bed Mobility: Not assessed (pt is OOB in chair) Transfers Transfers: Sit to Stand;Stand to Sit Sit to Stand: 4: Min assist;With upper extremity assist;With armrests;From chair/3-in-1 Stand to Sit: 4: Min assist;With upper extremity assist;With armrests;To chair/3-in-1 Details for Transfer Assistance: min assist to steady pt for balance as he comes to standing.  Heavy reliance on upper extremities for support during transitions Ambulation/Gait Ambulation/Gait Assistance: 3: Mod assist Ambulation Distance (Feet): 150 Feet Assistive device: Other (Comment);1 person hand held assist (IV pole) Ambulation/Gait Assistance Details: mod assist to prevent LOB during gait.  Gait Pattern: Step-through pattern;Shuffle;Narrow base of support (staggering gait pattern, almost, but not quite scissoring ) Gait velocity: less than 1.8 ft/sec indicating risk for recurrent falls        PT Diagnosis: Difficulty walking;Abnormality of gait;Generalized weakness;Altered mental status  PT Problem List: Decreased strength;Decreased activity tolerance;Decreased balance;Decreased mobility;Decreased coordination;Decreased cognition;Decreased knowledge of use of DME;Decreased safety awareness;Decreased knowledge of precautions PT Treatment Interventions: DME instruction;Gait training;Stair training;Functional mobility training;Therapeutic activities;Therapeutic exercise;Balance training;Neuromuscular re-education;Cognitive remediation;Patient/family education     PT Goals(Current goals can be found in the care plan section) Acute Rehab PT Goals Patient Stated Goal: to go home today PT Goal Formulation: With patient Time For Goal Achievement: 11/28/12 Potential to Achieve Goals: Good  Visit Information  Last PT Received On: 11/14/12 Assistance Needed: +1 History of Present Illness: 72 y.o. male admitted to [facility] on 11/11/12 with past medical history of TIA (2011) and SAH (2011) as well  as intermittent episodes of encephalopathy associated with  hypertension since 2003 brought to ED with altered mental status and hypertension. In ED: developed emesis - intubated for airway protection. CT revealed no acute findings.  MRI revealed Unusual pattern of restricted diffusion is most notable in the corpus callosum. 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 The pattern of diffusion and T2 signal abnormality is not suggestive of an ischemic process. Advanced atrophy and chronic microvascular ischemic change.  CXR Some increase in left basilar airspace disease and small effusion.  Pt extubated 11/13/12.         Prior Functioning  Home Living Family/patient expects to be discharged to:: Private residence Living Arrangements: Spouse/significant other Available Help at Discharge: Family;Available 24 hours/day Type of Home: House Home Access: Stairs to enter [employer] of Steps: 5 Entrance Stairs-Rails: Right Home Layout: One level Home Equipment: Cane - single point Prior Function Level of Independence: Independent Comments: independent with h/o frequent falls.   Communication Communication: HOH    Cognition  Cognition Arousal/Alertness: Awake/alert Behavior During Therapy: WFL for tasks assessed/performed Overall Cognitive Status: History of cognitive impairments - at baseline (pt is generally oriented, but slow to process questions)    Extremity/Trunk Assessment Upper Extremity Assessment Upper Extremity Assessment: Defer to OT evaluation Lower Extremity Assessment Lower Extremity Assessment: Generalized weakness Cervical / Trunk Assessment Cervical / Trunk Assessment: Normal   Balance Balance Balance Assessed: Yes Static Sitting Balance Static Sitting - Balance Support: Bilateral upper extremity supported;Feet supported Static Sitting - Level of Assistance: 5: Stand by assistance Static Standing Balance Static Standing - Balance Support: Right upper  extremity supported Static Standing - Level of Assistance: 4: Min assist Dynamic Standing Balance Dynamic Standing - Balance Support: Bilateral upper extremity supported Dynamic Standing - Level of Assistance: 3: Mod assist  End of Session PT - End of Session Activity Tolerance: Patient tolerated treatment well Patient left: in chair;with call bell/phone within reach    [place] B. Alixandrea Milleson, PT, DPT [phone]   11/14/2012, 3:48 PM

## 2012-11-15 DIAGNOSIS — R5381 Other malaise: Secondary | ICD-10-CM

## 2012-11-15 DIAGNOSIS — I609 Nontraumatic subarachnoid hemorrhage, unspecified: Secondary | ICD-10-CM

## 2012-11-15 DIAGNOSIS — R569 Unspecified convulsions: Secondary | ICD-10-CM

## 2012-11-15 DIAGNOSIS — R259 Unspecified abnormal involuntary movements: Secondary | ICD-10-CM

## 2012-11-15 DIAGNOSIS — I2510 Atherosclerotic heart disease of native coronary artery without angina pectoris: Secondary | ICD-10-CM

## 2012-11-15 LAB — BASIC METABOLIC PANEL
BUN: 15 mg/dL (ref 6–23)
CO2: 25 mEq/L (ref 19–32)
Chloride: 109 mEq/L (ref 96–112)
Creatinine, Ser: 1.05 mg/dL (ref 0.50–1.35)
Potassium: 3.2 mEq/L — ABNORMAL LOW (ref 3.5–5.1)

## 2012-11-15 LAB — GLUCOSE, CAPILLARY: Glucose-Capillary: 105 mg/dL — ABNORMAL HIGH (ref 70–99)

## 2012-11-15 MED ORDER — POTASSIUM CHLORIDE CRYS ER 20 MEQ PO TBCR
40.0000 meq | EXTENDED_RELEASE_TABLET | Freq: Once | ORAL | Status: AC
  Administered 2012-11-15: 40 meq via ORAL
  Filled 2012-11-15: qty 2

## 2012-11-15 MED ORDER — LABETALOL HCL 200 MG PO TABS
200.0000 mg | ORAL_TABLET | Freq: Two times a day (BID) | ORAL | Status: DC
  Administered 2012-11-15 – 2012-11-19 (×8): 200 mg via ORAL
  Filled 2012-11-15 (×10): qty 1

## 2012-11-15 MED ORDER — LEVETIRACETAM 500 MG PO TABS
500.0000 mg | ORAL_TABLET | Freq: Two times a day (BID) | ORAL | Status: DC
  Administered 2012-11-15 – 2012-11-19 (×9): 500 mg via ORAL
  Filled 2012-11-15 (×10): qty 1

## 2012-11-15 NOTE — Progress Notes (Signed)
Physical Therapy Treatment Patient Details Name: Benjamin Park MRN: [id number] DOB: 1940/07/18 Today's Date: 11/15/2012 Time: 1443-1500 PT Time Calculation (min): 17 min  PT Assessment / Plan / Recommendation  History of Present Illness 72 y.o. male admitted to [facility] on 11/11/12 with past medical history of TIA (2011) and SAH (2011) as well as intermittent episodes of encephalopathy associated with hypertension since 2003 brought to ED with altered mental status and hypertension. In ED: developed emesis - intubated for airway protection. CT revealed no acute findings.  MRI revealed Unusual pattern of restricted diffusion is most notable in the corpus callosum. 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 The pattern of diffusion and T2 signal abnormality is not suggestive of an ischemic process. Advanced atrophy and chronic microvascular ischemic change.  CXR Some increase in left basilar airspace disease and small effusion.  Pt extubated 11/13/12.     PT Comments   Pt is progressing well with mobility, however, even with RW he would need someone with him at all times to keep him from falling at this point.  He scored an 18/56 on his Berg Balance Test putting him at 100% risk of falls.  I continue to believe that he is an excellent inpatient rehab candidate.    Follow Up Recommendations  CIR     Does the patient have the potential to tolerate intense rehabilitation    Yes  Barriers to Discharge   None      Equipment Recommendations  Rolling walker with 5" wheels    Recommendations for Other Services Rehab consult  Frequency Min 3X/week   Progress towards PT Goals Progress towards PT goals: Progressing toward goals  Plan Current plan remains appropriate    Precautions / Restrictions Precautions Precautions: Fall Precaution Comments: chart states that wife report frequent fall   Pertinent Vitals/Pain See vitals flow sheet.     Mobility  Bed Mobility Bed Mobility: Not assessed (pt  sitting up OOB) Transfers Transfers: Sit to Stand;Stand to Sit Sit to Stand: 4: Min assist;With upper extremity assist;Without upper extremity assist;With armrests;From chair/3-in-1 Stand to Sit: 4: Min assist;With upper extremity assist;With armrests;To chair/3-in-1 Details for Transfer Assistance: It took pt several attempts to get to standing from lower chair even using both hands.  He would get to standing and immediately stagger backwards and fall back into his chair.   Ambulation/Gait Ambulation/Gait Assistance: 4: Min assist;3: Mod assist Ambulation Distance (Feet): 200 Feet Assistive device: Rolling walker;Straight cane Ambulation/Gait Assistance Details: mod assist to walk with cane, so quickly switched to RW.  Pt still needed min hands-on assist with RW to prevent LOB while turning, navigating obsticles and needed max verbal cues for safety.  He has a very narrow internally rotated gait pattern.  He is almost scissoring, but not.  He at times is catching his own feet on each other.   Gait Pattern: Step-through pattern;Shuffle;Narrow base of support Gait velocity: better with RW vs. cane      PT Goals (current goals can now be found in the care plan section) Acute Rehab PT Goals Patient Stated Goal: to go home  Visit Information  Last PT Received On: 11/15/12 Assistance Needed: +1 History of Present Illness: 72 y.o. male admitted to [facility] on 11/11/12 with past medical history of TIA (2011) and [facility] (2011) as well as intermittent episodes of encephalopathy associated with hypertension since 2003 brought to ED with altered mental status and hypertension. In ED: developed emesis - intubated for airway protection. CT revealed no  acute findings.  MRI revealed Unusual pattern of restricted diffusion is most notable in the corpus callosum. 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 The pattern of diffusion and T2 signal abnormality is not suggestive of an ischemic process. Advanced atrophy  and chronic microvascular ischemic change.  CXR Some increase in left basilar airspace disease and small effusion.  Pt extubated 11/13/12.      Subjective Data  Subjective: Pt reports that his preference is to go home with RW and HHPT, but that he is open to going to rehab if that is what his wife and the doctors think is best.  Pt has poor insight into his deficits and thinks falling occationally is "ok".   Patient Stated Goal: to go home   Cognition  Cognition Arousal/Alertness: Awake/alert Behavior During Therapy: WFL for tasks assessed/performed Overall Cognitive Status: History of cognitive impairments - at baseline (poor insight into his deficits toady)    Biomedical scientist Test Sit to Stand: Needs minimal aid to stand or to stabilize Standing Unsupported: Able to stand 2 minutes with supervision Sitting with Back Unsupported but Feet Supported on Floor or Stool: Able to sit 2 minutes under supervision Stand to Sit: Sits independently, has uncontrolled descent Transfers: Able to transfer with verbal cueing and /or supervision Standing Unsupported with Eyes Closed: Able to stand 10 seconds with supervision Standing Ubsupported with Feet Together: Needs help to attain position and unable to hold for 15 seconds From Standing, Reach Forward with Outstretched Arm: Reaches forward but needs supervision From Standing Position, Pick up Object from Floor: Able to pick up shoe, needs supervision From Standing Position, Turn to Look Behind Over each Shoulder: Needs supervision when turning Turn 360 Degrees: Needs assistance while turning Standing Unsupported, Alternately Place Feet on Step/Stool: Needs assistance to keep from falling or unable to try Standing Unsupported, One Foot in Front: Loses balance while stepping or standing Standing on One Leg: Unable to try or needs assist to prevent fall Total Score: 18  End of Session PT - End of Session Equipment Utilized During Treatment: Gait  belt Activity Tolerance: Patient tolerated treatment well Patient left: in chair;with call bell/phone within reach     [place] B. Chistopher Mangino, PT, DPT [phone]   11/15/2012, 3:26 PM

## 2012-11-15 NOTE — Evaluation (Signed)
Occupational Therapy Evaluation Patient Details Name: Benjamin Park MRN: [id number] DOB: 10-17-1940 Today's Date: 11/15/2012 Time: 1202-1220 OT Time Calculation (min): 18 min  OT Assessment / Plan / Recommendation History of present illness 72 y.o. male admitted to [facility] on 11/11/12 with past medical history of TIA (2011) and [facility] (2011) as well as intermittent episodes of encephalopathy associated with hypertension since 2003 brought to ED with altered mental status and hypertension. In ED: developed emesis - intubated for airway protection. CT revealed no acute findings.  MRI revealed Unusual pattern of restricted diffusion is most notable in the corpus callosum. 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 The pattern of diffusion and T2 signal abnormality is not suggestive of an ischemic process. Advanced atrophy and chronic microvascular ischemic change.  CXR Some increase in left basilar airspace disease and small effusion.  Pt extubated 11/13/12.     Clinical Impression   Pt will continue to benefit from acute OT services to address below problem list.  Recommending CIR to further maximize safety and independence with ADLs prior to return home.    OT Assessment  Patient needs continued OT Services    Follow Up Recommendations  CIR    Barriers to Discharge      Equipment Recommendations  Tub/shower bench    Recommendations for Other Services Rehab consult  Frequency  Min 2X/week    Precautions / Restrictions Precautions Precautions: Fall Precaution Comments: chart states that wife report frequent fall   Pertinent Vitals/Pain See vitals    ADL  Grooming: Performed;Wash/dry hands;Teeth care;Brushing hair;Min guard Where Assessed - Grooming: Supported standing Upper Body Bathing: Simulated;Set up Where Assessed - Upper Body Bathing: Unsupported sitting Lower Body Bathing: Simulated;Minimal assistance Where Assessed - Lower Body Bathing: Supported sit to stand Upper Body  Dressing: Performed;Set up Where Assessed - Upper Body Dressing: Unsupported sitting Lower Body Dressing: Performed;Minimal assistance Where Assessed - Lower Body Dressing: Supported sit to stand Toilet Transfer: Simulated;Minimal assistance Toilet Transfer Method: Sit to Barista:  (chair) Equipment Used: Gait belt Transfers/Ambulation Related to ADLs: Min assist without device. Pt reaching for furniture to hold onto for steadying. ADL Comments: Assist for all standing components of ADLs.    OT Diagnosis: Generalized weakness  OT Problem List: Decreased strength;Decreased activity tolerance;Impaired balance (sitting and/or standing);Decreased knowledge of use of DME or AE OT Treatment Interventions: Self-care/ADL training;DME and/or AE instruction;Therapeutic activities;Patient/family education;Balance training   OT Goals(Current goals can be found in the care plan section) Acute Rehab OT Goals Patient Stated Goal: to go home OT Goal Formulation: With patient Time For Goal Achievement: 11/29/12 Potential to Achieve Goals: Good  Visit Information  Last OT Received On: 11/15/12 Assistance Needed: +1 History of Present Illness: 72 y.o. male admitted to [facility] on 11/11/12 with past medical history of TIA (2011) and SAH (2011) as well as intermittent episodes of encephalopathy associated with hypertension since 2003 brought to ED with altered mental status and hypertension. In ED: developed emesis - intubated for airway protection. CT revealed no acute findings.  MRI revealed Unusual pattern of restricted diffusion is most notable in the corpus callosum. 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 The pattern of diffusion and T2 signal abnormality is not suggestive of an ischemic process. Advanced atrophy and chronic microvascular ischemic change.  CXR Some increase in left basilar airspace disease and small effusion.  Pt extubated 11/13/12.         Prior  Functioning     Home Living Family/patient expects  to be discharged to:: Private residence Living Arrangements: Spouse/significant other Available Help at Discharge: Family;Available 24 hours/day Type of Home: House Home Access: Stairs to enter [employer] of Steps: 5 Entrance Stairs-Rails: Right Home Layout: One level Home Equipment: Cane - single point Prior Function Level of Independence: Independent Comments: independent with h/o frequent falls.   Communication Communication: HOH         Vision/Perception Vision - History Baseline Vision: Wears glasses all the time   Cognition  Cognition Arousal/Alertness: Awake/alert Behavior During Therapy: WFL for tasks assessed/performed Overall Cognitive Status: History of cognitive impairments - at baseline    Extremity/Trunk Assessment Upper Extremity Assessment Upper Extremity Assessment: Overall WFL for tasks assessed     Mobility Bed Mobility Bed Mobility: Not assessed (pt up in chair) Transfers Transfers: Sit to Stand;Stand to Sit Sit to Stand: 4: Min assist;From chair/3-in-1 Stand to Sit: 4: Min assist;To chair/3-in-1 Details for Transfer Assistance: Assist for steadying and balance due to initial posterior lean upon standing.     Exercise     Balance Static Standing Balance Static Standing - Balance Support: During functional activity;No upper extremity supported Static Standing - Level of Assistance: 5: Stand by assistance;4: Min assist Static Standing - Comment/# of Minutes: Min guard for safety while pt stood at sink for grooming. Pt leaning anteriorly against sink countertop for support. Berg Balance Test Sit to Stand: Needs minimal aid to stand or to stabilize Standing Unsupported: Able to stand 2 minutes with supervision Sitting with Back Unsupported but Feet Supported on Floor or Stool: Able to sit 2 minutes under supervision Stand to Sit: Sits independently, has uncontrolled  descent Transfers: Able to transfer with verbal cueing and /or supervision Standing Unsupported with Eyes Closed: Able to stand 10 seconds with supervision Standing Ubsupported with Feet Together: Needs help to attain position and unable to hold for 15 seconds From Standing, Reach Forward with Outstretched Arm: Reaches forward but needs supervision From Standing Position, Pick up Object from Floor: Able to pick up shoe, needs supervision From Standing Position, Turn to Look Behind Over each Shoulder: Needs supervision when turning Turn 360 Degrees: Needs assistance while turning Standing Unsupported, Alternately Place Feet on Step/Stool: Needs assistance to keep from falling or unable to try Standing Unsupported, One Foot in Front: Loses balance while stepping or standing Standing on One Leg: Unable to try or needs assist to prevent fall Total Score: 18   End of Session OT - End of Session Equipment Utilized During Treatment: Gait belt Activity Tolerance: Patient tolerated treatment well Patient left: in chair;with call bell/phone within reach Nurse Communication: Mobility status  GO   11/15/2012 Cipriano Mile OTR/L Pager [phone] Office [phone]   Cipriano Mile 11/15/2012, 4:25 PM

## 2012-11-15 NOTE — Progress Notes (Addendum)
[facility] PROGRESS NOTE  Benjamin Park ZOX:[id number] DOB: 03-27-1940 DOA: 11/11/2012 PCP: Lorenda Peck, MD  Assessment/Plan: Principal Problem:   Hypertensive encephalopathy Active Problems:   Hx of transient ischemic attack (TIA)   Hyperlipidemia   Sleep apnea   Hypertension   Labile hypertension   Hypertensive urgency   Acute respiratory failure   Hypertensive emergency     1. Malignant HTN/Acute encephalopathy: Patient has known history of recurrent episodes of hypertensive encephalopathy. He presented with AMS, in the setting of markedly elevated BP of 195/125, consistent with this diagnosis. Managed i the intensive care unit, with aggressive BP control. Patient has now been transitioned to pre-admission oral antihypertensive. Mental status is close to baseline at this time.  2. 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 Patient was commenced on Keppra, accordingly. Brain MRI revealed an unusual pattern of restricted diffusion is most notable in the  corpus callosum, possibly related to epileptiform activity. EEG showed generalized slowing consistent with normal sleep, no epileptiform activity  3. Acute respiratory failure: Patient went into acute hypoxic respiratory failure, due to emesis, AMS/inability to protect airway, in the setting of known OSA, and had to be intubated/Ventilated.  CXR revealed LLL atelectasis. Successfully extubated on 11/13/12, and is now saturating at 97% on RA. Respiratory status has remained stable since. Patient declined nocturnal CPAP. Patient was initially treated with iv vancomycin/Zosyn, but these were discontinued on 11/13/12.  4. Dysphagia: Known prior history of  dysphagia. Patient was evaluated by SLP with MBS, which revealed severe pharyngeal phase dysphagia, oral phase dysphagia. Recommended D1/Pudding-thick. No liquids.  5. Oral Candidiasis: An incidental finding on physical examination. On day #2/14 Diflucan. 6. Hypothyroidism: On thyroxine replacement therapy.  7. History of CAD: Stable/No clinical evidence of ACS. 8. Deconditioning: Patient appears significantly deconditioned, due to acute medical issues. Rehab consult requested for possible CIR.    Code Status: Full Code.  Family Communication:  Disposition Plan: To be determined.    Brief narrative: 72 yo with past medical history of TIA (2011) and SAH (2011) as well as intermittent episodes of encephalopathy associated with hypertension since 2003, s/p CVA, CAD, s/p MI, s/p PCI, PFO, OSA on CPAP, throat cancer, s/p XRT, dysphagia, hypothyroidism, brought to ED with altered mental status and hypertension. In ED: developed emesis, and was intubated for airway protection. CT revealed no acute findings. Seizures were suspected and Keppra given. PCCM was consulted. Transferred to [facility] service, effective 11/15/12.   Consultants:  Dr Noel Christmas, neurologist.   Procedures:  See Notes.   Antibiotics:  Vancomycin 11/12/12-11/13/12.  Zosyn 11/12/12-11/13/12.   Diflucan 11/14/12>>>  HPI/Subjective: No new issues.   Objective: Vital signs in last 24 hours: Temp:  [97.4 F (36.3 C)-99.2 F (37.3 C)] 98.1 F (36.7 C) (08/29 0421) Pulse Rate:  [69-78] 73 (08/29 0655) Resp:  [13-21] 16 (08/28 1816) BP: (119-180)/(74-103) 148/90 mmHg (08/29 0421) SpO2:  [97 %-100 %] 97 % (08/29 0421) Weight:  [78.291 kg (172 lb 9.6 oz)] 78.291 kg (172 lb 9.6 oz) (08/29 0421) Weight change:  Last BM Date: 11/11/12  Intake/Output from previous day: 08/28 0701 - 08/29 0700 In: 608 [I.V.:403; IV Piggyback:205] Out: 270 [Urine:270]     Physical Exam: General: Comfortable, alert, communicative,  oriented, not short of  breath at rest.  HEENT:  No clinical pallor, no jaundice, no conjunctival injection or discharge. Mild oral thrush.  NECK:  Supple, JVP not seen, no carotid bruits, no palpable lymphadenopathy, no palpable goiter. CHEST:  Clinically clear to auscultation, no wheezes, no crackles. HEART:  Sounds 1 and 2 heard, normal, regular, no murmurs. ABDOMEN:  Full, soft, non-tender, no palpable organomegaly, no palpable masses, normal bowel sounds. GENITALIA:  Not examined. LOWER EXTREMITIES:  No pitting edema, palpable peripheral pulses. MUSCULOSKELETAL SYSTEM:  Generalized osteoarthritic changes, otherwise, normal. CENTRAL NERVOUS SYSTEM:  No focal neurologic deficit on gross examination.  Lab Results:  Recent Labs  11/13/12 0435 11/14/12 0500  WBC 11.0* 8.7  HGB 13.4 13.8  HCT 39.0 37.9*  PLT 128* 122*    Recent Labs  11/14/12 0500 11/15/12 0559  NA 143 143  K 3.1* 3.2*  CL 110 109  CO2 24 25  GLUCOSE 95 106*  BUN 10 15  CREATININE 1.02 1.05  CALCIUM 8.5 8.8   Recent Results (from the past 240 hour(s))  MRSA PCR SCREENING     Status: None   Collection Time    11/11/12  9:27 PM      Result Value Range Status   MRSA by PCR NEGATIVE  NEGATIVE Final   Comment:            The GeneXpert MRSA Assay (FDA     approved for NASAL specimens     only), is one component of a     comprehensive MRSA colonization     surveillance program. It is not     intended to diagnose MRSA     infection nor to guide or     monitor treatment for     MRSA infections.  URINE CULTURE     Status: None   Collection Time    11/11/12  9:27 PM      Result Value Range Status   Specimen Description URINE, CATHETERIZED   Final   Special Requests ADDED [id number]   Final   Culture  Setup Time     Final   Value: 11/12/2012 13:02     Performed at [facility]   Colony Count     Final   Value: NO GROWTH     Performed at [facility]   Culture     Final   Value: NO  GROWTH     Performed at [facility]   Report Status 11/13/2012 FINAL   Final  CULTURE, BLOOD (ROUTINE X 2)     Status: None   Collection Time    11/12/12  6:30 AM      Result Value Range Status   Specimen Description BLOOD RIGHT ANTECUBITAL   Final   Special Requests BOTTLES DRAWN AEROBIC AND ANAEROBIC   Final   Culture  Setup Time     Final   Value: 11/12/2012 11:21     Performed at [facility]   Culture     Final   Value:        BLOOD CULTURE RECEIVED NO GROWTH TO DATE CULTURE WILL BE HELD FOR 5 DAYS BEFORE ISSUING A FINAL NEGATIVE REPORT     Performed at [facility]   Report Status PENDING   Incomplete  CULTURE, BLOOD (ROUTINE X 2)     Status: None   Collection Time    11/12/12  6:50 AM      Result Value Range Status   Specimen  Description BLOOD LEFT HAND   Final   Special Requests BOTTLES DRAWN AEROBIC ONLY 1CC   Final   Culture  Setup Time     Final   Value: 11/12/2012 11:21     Performed at [facility]   Culture     Final   Value:        BLOOD CULTURE RECEIVED NO GROWTH TO DATE CULTURE WILL BE HELD FOR 5 DAYS BEFORE ISSUING A FINAL NEGATIVE REPORT     Performed at [facility]   Report Status PENDING   Incomplete  CULTURE, RESPIRATORY (NON-EXPECTORATED)     Status: None   Collection Time    11/12/12  7:51 PM      Result Value Range Status   Specimen Description TRACHEAL ASPIRATE   Final   Special Requests NONE   Final   Gram Stain     Final   Value: MODERATE WBC PRESENT,BOTH PMN AND MONONUCLEAR     NO SQUAMOUS EPITHELIAL CELLS SEEN     RARE GRAM NEGATIVE RODS     RARE YEAST     Performed at [facility]   Culture     Final   Value: Culture reincubated for better growth     Performed at [facility]   Report Status PENDING   Incomplete     Studies/Results: Dg Chest Port 1 View  11/14/2012   *RADIOLOGY REPORT*  Clinical Data: Congestion and atelectasis.  PORTABLE CHEST - 1 VIEW  Comparison: 11/13/2012   Findings: Jugular central line tip is in the lower SVC region.  The endotracheal tube and nasogastric tube have been removed.  There are persistent densities at the left lung base which may represent atelectasis and cannot exclude pleural fluid.  Heart size is grossly stable.  Upper lungs are clear.  IMPRESSION: Persistent densities at the left lung base as described.   Original Report Authenticated By: Richarda Overlie, M.D.   Dg Swallowing Func-speech Pathology  11/14/2012   Breck Coons [place], CCC-SLP     11/14/2012 10:31 AM Objective Swallowing Evaluation: Modified Barium Swallowing Study   Patient Details  Name: Benjamin Park MRN: [id number] Date of Birth: [birth year]  Today's Date: 11/14/2012 Time: 0102-7253 SLP Time Calculation (min): 15 min  Past Medical History:  Past Medical History  Diagnosis Date  . Hypertension   . Hx of transient ischemic attack (TIA)   . Hyperlipidemia   . Coronary artery disease   . Throat cancer     radiation therapy  . Hypertensive encephalopathy   . ED (erectile dysfunction)   . Personal history of kidney stones   . Subarachnoid hemorrhage   . Sleep apnea     on CPAP  . Stroke   . Myocardial infarction     12/1991  . Headache(784.0)   . Dysphagia   . Hypokalemia   . AKI (acute kidney injury) 02/23/2011  . Hypothyroidism   . Unspecified cerebral artery occlusion with cerebral infarction  11/08/2012   Past Surgical History:  Past Surgical History  Procedure Laterality Date  . Angioplasty  1993    LAD  . Cardiac catheterization    . Coronary angioplasty    . Basal cell cancer      head  . Hernia repair    . Appendectomy    . Patent foramen ovale closure  2008  . Eye surgery    . Colonoscopy N/A 06/18/2012    Procedure: COLONOSCOPY;  Surgeon: Petra Kuba, MD;  Location:  [facility] ENDOSCOPY;  Service: Endoscopy;  Laterality: N/A;  h&p in  file-hope   . Hot hemostasis N/A 06/18/2012    Procedure: HOT HEMOSTASIS (ARGON PLASMA COAGULATION/BICAP);   Surgeon: Petra Kuba, MD;  Location: [facility] ENDOSCOPY;   Service:  Endoscopy;  Laterality: N/A;   HPI:  72 yo with past medical history of TIA (2011) and SAH (2011) as  well as intermittent episodes of encephalopathy associated with  hypertension since 2003 brought to ED with altered mental status  and hypertension. In ED: developed emesis - intubated for airway  protection. CT revealed no acute findings.  MRI revealed Unusual  pattern of restricted diffusion is most notable in the corpus  callosum. 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 The  pattern of diffusion and T2 signal abnormality is not suggestive  of an ischemic process. Advanced atrophy and chronic  microvascular ischemic change.  CXR Some increase in left basilar  airspace disease and small effusion. No other change.  Wife  reports pt. did not follow recommendations of honey thick liquid  once discharged in 2012 and that he coughs "almost every time he  drinks".  He has not had pna in the past two years.  MBS  recommended.     Assessment / Plan / Recommendation Clinical Impression  Dysphagia Diagnosis: Severe pharyngeal phase dysphagia;Mild oral  phase dysphagia Clinical impression: Mild-severe oral and pharyngeal dysphagia  based on impairments of motor tract (mild sensory involvement)  with history of chronic dysphagia exacerbated by 3-4 day  intubation.   Poor tongue base retraction and laryngeal elevation  led to copious vallecular residue/mild pyriform sinus residue  resulting in frank aspiration with tsp honey thick barium  (delayed cough).  Therapeutic intervention included chin tuck  posture which increased aspiration.  Prognosis for improvement  for return to baseline is good as edema and irritation of  pharynx/larynx decreases.  Recommend a Dys 1 diet (difficulty  masticating and premature spill while masticating) and NO liquids  (pudding thick drinks).  SLP will follow.      Treatment Recommendation  Therapy as outlined in treatment plan below    Diet Recommendation Dysphagia 1 (Puree);No  liquids (pudding thick  drinks)   Liquid Administration via: Spoon Medication Administration: Crushed with puree Supervision: Patient able to self feed;Intermittent supervision  to cue for compensatory strategies Compensations: Slow rate;Small sips/bites;Multiple dry swallows  after each bite/sip;Clear throat intermittently Postural Changes and/or Swallow Maneuvers: Seated upright 90  degrees    Other  Recommendations Oral Care Recommendations: Oral care BID   Follow Up Recommendations   (TBD)    Frequency and Duration min 2x/week  2 weeks   Pertinent Vitals/Pain none    SLP Swallow Goals Patient will utilize recommended strategies during swallow to  increase swallowing safety with: Minimal cueing      Reason for Referral Objectively evaluate swallowing function   Oral Phase Oral Preparation/Oral Phase Oral Phase: Impaired Oral - Solids Oral - Regular: Weak lingual manipulation;Delayed oral transit   Pharyngeal Phase Pharyngeal Phase Pharyngeal Phase: Impaired Pharyngeal - Honey Pharyngeal - Honey Teaspoon: Penetration/Aspiration during  swallow;Penetration/Aspiration after swallow;Reduced tongue base  retraction;Reduced airway/laryngeal closure;Reduced laryngeal  elevation;Reduced anterior laryngeal mobility;Pharyngeal residue  - valleculae;Pharyngeal residue - pyriform sinuses Penetration/Aspiration details (honey teaspoon): Material enters  airway, passes BELOW cords and not ejected out despite cough  attempt by patient (delayed cough) Pharyngeal - Solids Pharyngeal - Puree: Reduced anterior laryngeal mobility;Reduced  laryngeal elevation;Reduced tongue base retraction;Pharyngeal  residue - valleculae;Pharyngeal  residue - pyriform sinuses  Cervical Esophageal Phase    GO    Cervical Esophageal Phase Cervical Esophageal Phase: [facility]         Darrow Bussing.Ed CCC-SLP Pager [phone]  11/14/2012    Medications: Scheduled Meds: . aspirin  325 mg Oral Daily  . feeding supplement  1 Container Oral TID BM  .  fluconazole  100 mg Oral Daily  . irbesartan  150 mg Oral Daily  . labetalol  100 mg Oral BID  . levETIRAcetam  500 mg Intravenous Q12H  . levothyroxine  100 mcg Oral QAC breakfast  . multivitamin with minerals  1 tablet Oral Daily  . pneumococcal 23 valent vaccine  0.5 mL Intramuscular Tomorrow-1000  . simvastatin  40 mg Oral q1800  . sodium chloride  3 mL Intravenous Q12H   Continuous Infusions: . dextrose 5 % and 0.45 % NaCl with KCl 20 mEq/L 50 mL/hr at 11/13/12 0938   PRN Meds:.acetaminophen (TYLENOL) oral liquid 160 mg/5 mL, food thickener, labetalol    LOS: 4 days   Remington Highbaugh,CHRISTOPHER  [facility] Pager [phone]. If 8PM-8AM, please contact night-coverage at [email or web address], password [facility] 11/15/2012, 7:46 AM  LOS: 4 days

## 2012-11-15 NOTE — Progress Notes (Addendum)
NEURO HOSPITALIST PROGRESS NOTE   SUBJECTIVE:                                                                                                                        Patient is alert and oriented, on the phone talking to family.  He has no confusion and feeling back to his baseline.   OBJECTIVE:                                                                                                                           Vital signs in last 24 hours: Temp:  [97.4 F (36.3 C)-99.2 F (37.3 C)] 98.1 F (36.7 C) (08/29 0421) Pulse Rate:  [69-77] 73 (08/29 0655) Resp:  [13-16] 16 (08/28 1816) BP: (119-180)/(74-103) 148/90 mmHg (08/29 0421) SpO2:  [97 %-100 %] 97 % (08/29 0421) Weight:  [78.291 kg (172 lb 9.6 oz)] 78.291 kg (172 lb 9.6 oz) (08/29 0421)  Intake/Output from previous day: 08/28 0701 - 08/29 0700 In: 608 [I.V.:403; IV Piggyback:205] Out: 270 [Urine:270] Intake/Output this shift: Total I/O In: 240 [P.O.:240] Out: -  Nutritional status: Dysphagia  Past Medical History  Diagnosis Date  . Hypertension   . Hx of transient ischemic attack (TIA)   . Hyperlipidemia   . Coronary artery disease   . Throat cancer     radiation therapy  . Hypertensive encephalopathy   . ED (erectile dysfunction)   . Personal history of kidney stones   . Subarachnoid hemorrhage   . Sleep apnea     on CPAP  . Stroke   . Myocardial infarction     12/1991  . Headache(784.0)   . Dysphagia   . Hypokalemia   . AKI (acute kidney injury) 02/23/2011  . Hypothyroidism   . Unspecified cerebral artery occlusion with cerebral infarction 11/08/2012     Neurologic Exam:  Mental Status: Alert, oriented, thought content appropriate.  Speech fluent without evidence of aphasia.  Able to follow 3 step commands without difficulty. Cranial Nerves: II: Discs flat bilaterally; Visual fields grossly normal, pupils equal, round, reactive to light and accommodation III,IV,  VI: ptosis not present, extra-ocular motions intact bilaterally V,VII: smile symmetric, facial light touch sensation normal bilaterally VIII: hearing normal bilaterally IX,X: gag  reflex present XI: bilateral shoulder shrug XII: midline tongue extension Motor: Right : Upper extremity   5/5    Left:     Upper extremity   5/5  Lower extremity   5/5     Lower extremity   5/5 Tone and bulk:normal tone throughout; no atrophy noted Sensory: Pinprick and light touch intact throughout, bilaterally Deep Tendon Reflexes:  Right: Upper Extremity   Left: Upper extremity   biceps (C-5 to C-6) 2/4   biceps (C-5 to C-6) 2/4 tricep (C7) 2/4    triceps (C7) 2/4 Brachioradialis (C6) 2/4  Brachioradialis (C6) 2/4  Lower Extremity Lower Extremity  quadriceps (L-2 to L-4) 1/4   quadriceps (L-2 to L-4) 1/4 Achilles (S1) 1/4   Achilles (S1) 1/4  Plantars: Right: downgoing   Left: downgoing Cerebellar: normal finger-to-nose,  normal heel-to-shin test    Lab Results: Lab Results  Component Value Date/Time   CHOL 155 11/12/2012  4:10 AM   Lipid Panel No results found for this basename: CHOL, TRIG, HDL, CHOLHDL, VLDL, LDLCALC,  in the last 72 hours  Studies/Results: Dg Chest Port 1 View  11/14/2012   *RADIOLOGY REPORT*  Clinical Data: Congestion and atelectasis.  PORTABLE CHEST - 1 VIEW  Comparison: 11/13/2012  Findings: Jugular central line tip is in the lower SVC region.  The endotracheal tube and nasogastric tube have been removed.  There are persistent densities at the left lung base which may represent atelectasis and cannot exclude pleural fluid.  Heart size is grossly stable.  Upper lungs are clear.  IMPRESSION: Persistent densities at the left lung base as described.   Original Report Authenticated By: Richarda Overlie, M.D.   Dg Swallowing Func-speech Pathology  11/14/2012   Breck Coons [place], CCC-SLP     11/14/2012 10:31 AM Objective Swallowing Evaluation: Modified Barium Swallowing Study   Patient  Details  Name: Benjamin Park MRN: [id number] Date of Birth: March 22, 1940  Today's Date: 11/14/2012 Time: 1308-6578 SLP Time Calculation (min): 15 min  Past Medical History:  Past Medical History  Diagnosis Date  . Hypertension   . Hx of transient ischemic attack (TIA)   . Hyperlipidemia   . Coronary artery disease   . Throat cancer     radiation therapy  . Hypertensive encephalopathy   . ED (erectile dysfunction)   . Personal history of kidney stones   . Subarachnoid hemorrhage   . Sleep apnea     on CPAP  . Stroke   . Myocardial infarction     12/1991  . Headache(784.0)   . Dysphagia   . Hypokalemia   . AKI (acute kidney injury) 02/23/2011  . Hypothyroidism   . Unspecified cerebral artery occlusion with cerebral infarction  11/08/2012   Past Surgical History:  Past Surgical History  Procedure Laterality Date  . Angioplasty  1993    LAD  . Cardiac catheterization    . Coronary angioplasty    . Basal cell cancer      head  . Hernia repair    . Appendectomy    . Patent foramen ovale closure  2008  . Eye surgery    . Colonoscopy N/A 06/18/2012    Procedure: COLONOSCOPY;  Surgeon: Petra Kuba, MD;  Location:  [facility] ENDOSCOPY;  Service: Endoscopy;  Laterality: N/A;  h&p in  file-[place]   . Hot hemostasis N/A 06/18/2012    Procedure: HOT HEMOSTASIS (ARGON PLASMA COAGULATION/BICAP);   Surgeon: Petra Kuba, MD;  Location: [facility] ENDOSCOPY;  Service:  Endoscopy;  Laterality: N/A;   HPI:  72 yo with past medical history of TIA (2011) and SAH (2011) as  well as intermittent episodes of encephalopathy associated with  hypertension since 2003 brought to ED with altered mental status  and hypertension. In ED: developed emesis - intubated for airway  protection. CT revealed no acute findings.  MRI revealed Unusual  pattern of restricted diffusion is most notable in the corpus  callosum. 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 The  pattern of diffusion and T2 signal abnormality is not suggestive  of an ischemic process. Advanced atrophy  and chronic  microvascular ischemic change.  CXR Some increase in left basilar  airspace disease and small effusion. No other change.  Wife  reports pt. did not follow recommendations of honey thick liquid  once discharged in 2012 and that he coughs "almost every time he  drinks".  He has not had pna in the past two years.  MBS  recommended.     Assessment / Plan / Recommendation Clinical Impression  Dysphagia Diagnosis: Severe pharyngeal phase dysphagia;Mild oral  phase dysphagia Clinical impression: Mild-severe oral and pharyngeal dysphagia  based on impairments of motor tract (mild sensory involvement)  with history of chronic dysphagia exacerbated by 3-4 day  intubation.   Poor tongue base retraction and laryngeal elevation  led to copious vallecular residue/mild pyriform sinus residue  resulting in frank aspiration with tsp honey thick barium  (delayed cough).  Therapeutic intervention included chin tuck  posture which increased aspiration.  Prognosis for improvement  for return to baseline is good as edema and irritation of  pharynx/larynx decreases.  Recommend a Dys 1 diet (difficulty  masticating and premature spill while masticating) and NO liquids  (pudding thick drinks).  SLP will follow.      Treatment Recommendation  Therapy as outlined in treatment plan below    Diet Recommendation Dysphagia 1 (Puree);No liquids (pudding thick  drinks)   Liquid Administration via: Spoon Medication Administration: Crushed with puree Supervision: Patient able to self feed;Intermittent supervision  to cue for compensatory strategies Compensations: Slow rate;Small sips/bites;Multiple dry swallows  after each bite/sip;Clear throat intermittently Postural Changes and/or Swallow Maneuvers: Seated upright 90  degrees    Other  Recommendations Oral Care Recommendations: Oral care BID   Follow Up Recommendations   (TBD)    Frequency and Duration min 2x/week  2 weeks   Pertinent Vitals/Pain none    SLP Swallow Goals Patient will  utilize recommended strategies during swallow to  increase swallowing safety with: Minimal cueing      Reason for Referral Objectively evaluate swallowing function   Oral Phase Oral Preparation/Oral Phase Oral Phase: Impaired Oral - Solids Oral - Regular: Weak lingual manipulation;Delayed oral transit   Pharyngeal Phase Pharyngeal Phase Pharyngeal Phase: Impaired Pharyngeal - Honey Pharyngeal - Honey Teaspoon: Penetration/Aspiration during  swallow;Penetration/Aspiration after swallow;Reduced tongue base  retraction;Reduced airway/laryngeal closure;Reduced laryngeal  elevation;Reduced anterior laryngeal mobility;Pharyngeal residue  - valleculae;Pharyngeal residue - pyriform sinuses Penetration/Aspiration details (honey teaspoon): Material enters  airway, passes BELOW cords and not ejected out despite cough  attempt by patient (delayed cough) Pharyngeal - Solids Pharyngeal - Puree: Reduced anterior laryngeal mobility;Reduced  laryngeal elevation;Reduced tongue base retraction;Pharyngeal  residue - valleculae;Pharyngeal residue - pyriform sinuses  Cervical Esophageal Phase    GO    Cervical Esophageal Phase Cervical Esophageal Phase: [facility]         Darrow Bussing.Ed CCC-SLP Pager [phone]  11/14/2012    MEDICATIONS  Scheduled: . aspirin  325 mg Oral Daily  . feeding supplement  1 Container Oral TID BM  . fluconazole  100 mg Oral Daily  . irbesartan  150 mg Oral Daily  . labetalol  200 mg Oral BID  . levETIRAcetam  500 mg Oral BID  . levothyroxine  100 mcg Oral QAC breakfast  . multivitamin with minerals  1 tablet Oral Daily  . pneumococcal 23 valent vaccine  0.5 mL Intramuscular Tomorrow-1000  . simvastatin  40 mg Oral q1800  . sodium chloride  3 mL Intravenous Q12H    ASSESSMENT/PLAN:                                                                                                             Patient seems to have returned to baseline. Has had multiple similar events in the past. Currently on Keppra and tolerating well. MRI reviewed and has changes consistent with seizure.  Recommendations:  1. Continue Keppra at current PO dose.   2. Continue seizure precautions. 3. Follow up with neurology out patient.   No further recommendations. Neurology S/O  Assessment and plan discussed with with attending physician and they are in agreement.    Felicie Morn PA-C [facility] [phone]  11/15/2012, 11:52 AM

## 2012-11-15 NOTE — Consult Note (Signed)
Physical Medicine and Rehabilitation Consult  Reason for Consult: Seizures, gait disorder Referring Physician:  Dr. Brien Few   HPI: Benjamin Park is a 72 y.o. male with history of SAH, gait disorder, HTN with multiple episodes of encephalopathy with BP elevations. Family reported lack of memory of event followed by extreme fatigue and sleepiness.  Patient admitted on 11/11/12 with confusion with difficulty following commands, and lethargy. BP 203/131/ at admission. He was treated with IV labetalol and had improvement in mentation with improvement in mentation. 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 Patient with agitation followed by vomiting episode with concern of aspiration and patient intubated for airway support.   MRI brain done showing unusual diffusion pattern likely related to epileptiform activity. He was extubated without difficulty on 11/13/12 and MBS done with severed dysphagia with recommendations of Dysphagia 1 diet with no liquids. Diflucan added for oral candidiasis. Patient with recurrent episode last pm due to elevated BP 180/105--resolved past IV labetalol.  PT evaluation done and CIR recommended by MD as well as therapy team.  ROS: Decreased balance, confusion. A 12 point review of systems has been performed and if not noted above is otherwise negative.  Past Medical History  Diagnosis Date  . Hypertension   . Hx of transient ischemic attack (TIA)   . Hyperlipidemia   . Coronary artery disease   . Throat cancer     radiation therapy  . Hypertensive encephalopathy   . ED (erectile dysfunction)   . Personal history of kidney stones   . Subarachnoid hemorrhage   . Sleep apnea     on CPAP  . Stroke   . Myocardial infarction     12/1991  . Headache(784.0)   . Dysphagia   . Hypokalemia   . AKI (acute kidney injury) 02/23/2011  . Hypothyroidism   . Unspecified cerebral artery  occlusion with cerebral infarction 11/08/2012   Past Surgical History  Procedure Laterality Date  . Angioplasty  1993    LAD  . Cardiac catheterization    . Coronary angioplasty    . Basal cell cancer      head  . Hernia repair    . Appendectomy    . Patent foramen ovale closure  2008  . Eye surgery    . Colonoscopy N/A 06/18/2012    Procedure: COLONOSCOPY;  Surgeon: Petra Kuba, MD;  Location: [facility] ENDOSCOPY;  Service: Endoscopy;  Laterality: N/A;  h&p in file-[place]   . Hot hemostasis N/A 06/18/2012    Procedure: HOT HEMOSTASIS (ARGON PLASMA COAGULATION/BICAP);  Surgeon: Petra Kuba, MD;  Location: [facility] ENDOSCOPY;  Service: Endoscopy;  Laterality: N/A;   Family History  Problem Relation Age of Onset  . Heart failure Father   . Heart attack Brother   . Anemia Brother 39    mi   Social History:  reports that he quit smoking about 22 years ago. His smoking use included Cigarettes. He smoked 0.00 packs per day. He has never used smokeless tobacco. He reports that he does not drink alcohol or use illicit drugs.  Allergies: No Known Allergies  Medications Prior to Admission  Medication Sig Dispense Refill  . aspirin 325 MG tablet Take 325 mg by mouth daily.        . chlorthalidone (HYGROTON) 25 MG tablet Take 25 mg by mouth daily.      . cloNIDine (CATAPRES) 0.2 MG tablet Take  0.1 mg by mouth 2 (two) times daily as needed (for blood pressure greater than 160/100).      . fish oil-omega-3 fatty acids 1000 MG capsule Take 1 g by mouth daily.       Marland Kitchen GLUCOSAMINE PO Take 1 tablet by mouth daily.       Marland Kitchen labetalol (NORMODYNE) 100 MG tablet Take 100 mg by mouth 2 (two) times daily.        Marland Kitchen levothyroxine (SYNTHROID, LEVOTHROID) 100 MCG tablet Take 100 mcg by mouth daily.        Marland Kitchen lovastatin (MEVACOR) 40 MG tablet Take 80 mg by mouth at bedtime.       . Multiple Vitamin (MULITIVITAMIN WITH MINERALS) TABS Take 1 tablet by mouth daily.        Marland Kitchen olmesartan (BENICAR) 20 MG tablet Take 20 mg by mouth  at bedtime.      Marland Kitchen testosterone cypionate (DEPOTESTOTERONE CYPIONATE) 200 MG/ML injection Inject 200 mg into the muscle every 28 (twenty-eight) days.       Marland Kitchen topiramate (TOPAMAX) 25 MG tablet Take 25-50 mg by mouth at bedtime. Take 1 tablet for 1 week starting 11/08/12 then increase to 2 tablets every night        Home: Home Living Family/patient expects to be discharged to:: Private residence Living Arrangements: Spouse/significant other Available Help at Discharge: Family;Available 24 hours/day Type of Home: House Home Access: Stairs to enter [employer] of Steps: 5 Entrance Stairs-Rails: Right Home Layout: One level Home Equipment: Cane - single point  Functional History: Prior Function Comments: independent with h/o frequent falls.   Functional Status:  Mobility: Bed Mobility Bed Mobility: Not assessed (pt is OOB in chair) Transfers Transfers: Sit to Stand;Stand to Sit Sit to Stand: 4: Min assist;With upper extremity assist;With armrests;From chair/3-in-1 Stand to Sit: 4: Min assist;With upper extremity assist;With armrests;To chair/3-in-1 Ambulation/Gait Ambulation/Gait Assistance: 3: Mod assist Ambulation Distance (Feet): 150 Feet Assistive device: Other (Comment);1 person hand held assist (IV pole) Ambulation/Gait Assistance Details: mod assist to prevent LOB during gait.  Gait Pattern: Step-through pattern;Shuffle;Narrow base of support (staggering gait pattern, almost, but not quite scissoring ) Gait velocity: less than 1.8 ft/sec indicating risk for recurrent falls    ADL:    Cognition: Cognition Overall Cognitive Status: History of cognitive impairments - at baseline (pt is generally oriented, but slow to process questions) Orientation Level: Oriented X4 Cognition Arousal/Alertness: Awake/alert Behavior During Therapy: WFL for tasks assessed/performed Overall Cognitive Status: History of cognitive impairments - at baseline (pt is generally oriented,  but slow to process questions)  Blood pressure 148/90, pulse 73, temperature 98.1 F (36.7 C), temperature source Oral, resp. rate 16, height 6' 0.5" (1.842 m), weight 78.291 kg (172 lb 9.6 oz), SpO2 97.00%.   General: Alert and oriented x 3, No apparent distress HEENT: Head is normocephalic, atraumatic, PERRLA, EOMI, sclera anicteric, oral mucosa pink and moist, dentition intact, ext ear canals clear,  Neck: Supple without JVD or lymphadenopathy Heart: Reg rate and rhythm. No murmurs rubs or gallops Chest: CTA bilaterally without wheezes, rales, or rhonchi; no distress Abdomen: Soft, non-tender, non-distended, bowel sounds positive. Extremities: No clubbing, cyanosis, or edema. Pulses are 2+ Skin: Clean and intact without signs of breakdown Neuro: Pt is cognitively alert, delayed in processing. stm deficits. Poor insight and awareness.  Cranial nerves 2-12 are intact. Sensory exam is normal. Reflexes are 2+ in all 4's. Fine motor coordination is slightly impaired. No gross limb ataxia.  No tremors. Motor function  is grossly 4-5/5.  Musculoskeletal: Full ROM, No pain with AROM or PROM in the neck, trunk, or extremities. Posture appropriate Psych: Pt's affect is appropriate. Pt is cooperative    Results for orders placed during the hospital encounter of 11/11/12 (from the past 24 hour(s))  BASIC METABOLIC PANEL     Status: Abnormal   Collection Time    11/15/12  5:59 AM      Result Value Range   Sodium 143  135 - 145 mEq/L   Potassium 3.2 (*) 3.5 - 5.1 mEq/L   Chloride 109  96 - 112 mEq/L   CO2 25  19 - 32 mEq/L   Glucose, Bld 106 (*) 70 - 99 mg/dL   BUN 15  6 - 23 mg/dL   Creatinine, Ser 1.61  0.50 - 1.35 mg/dL   Calcium 8.8  8.4 - 09.6 mg/dL   GFR calc non Af Amer 69 (*) >90 mL/min   GFR calc Af Amer 80 (*) >90 mL/min   Dg Chest Port 1 View  11/14/2012   *RADIOLOGY REPORT*  Clinical Data: Congestion and atelectasis.  PORTABLE CHEST - 1 VIEW  Comparison: 11/13/2012  Findings:  Jugular central line tip is in the lower SVC region.  The endotracheal tube and nasogastric tube have been removed.  There are persistent densities at the left lung base which may represent atelectasis and cannot exclude pleural fluid.  Heart size is grossly stable.  Upper lungs are clear.  IMPRESSION: Persistent densities at the left lung base as described.   Original Report Authenticated By: Richarda Overlie, M.D.     Assessment/Plan: Diagnosis: hypertensive encephalopathy 1. Does the need for close, 24 hr/day medical supervision in concert with the patient's rehab needs make it unreasonable for this patient to be served in a less intensive setting? Potentially 2. Co-Morbidities requiring supervision/potential complications: htn, CAD, SAH,  3. Due to bladder management, bowel management, safety, skin/wound care, disease management, medication administration, pain management and patient education, does the patient require 24 hr/day rehab nursing? Yes 4. Does the patient require coordinated care of a physician, rehab nurse, PT (1-2 hrs/day, 5 days/week), OT (1-2 hrs/day, 5 days/week) and SLP (1-2 hrs/day, 5 days/week) to address physical and functional deficits in the context of the above medical diagnosis(es)? Yes Addressing deficits in the following areas: balance, endurance, locomotion, strength, transferring, bowel/bladder control, bathing, dressing, feeding, grooming, toileting, cognition and psychosocial support 5. Can the patient actively participate in an intensive therapy program of at least 3 hrs of therapy per day at least 5 days per week? Yes 6. The potential for patient to make measurable gains while on inpatient rehab is good 7. Anticipated functional outcomes upon discharge from inpatient rehab are supervision with PT, supervision with OT, supervision with SLP. 8. Estimated rehab length of stay to reach the above functional goals is: 10 days+ 9. Does the patient have adequate social supports  to accommodate these discharge functional goals? Potentially 10. Anticipated D/C setting: Home 11. Anticipated post D/C treatments: HH therapy 12. Overall Rehab/Functional Prognosis: good  RECOMMENDATIONS: This patient's condition is appropriate for continued rehabilitative care in the following setting: CIR Patient has agreed to participate in recommended program. Yes and Potentially Note that insurance prior authorization may be required for reimbursement for recommended care.  Comment: Ranelle Oyster, MD, [facility] Physical Medicine & Rehabilitation     11/15/2012

## 2012-11-15 NOTE — Progress Notes (Addendum)
Clinical Social Work Department BRIEF PSYCHOSOCIAL ASSESSMENT 11/15/2012  Patient:  Benjamin Park, Benjamin Park     Account Number:  [id number]     Admit date:  11/11/2012  Clinical Social Worker:  Harless Nakayama  Date/Time:  11/15/2012 10:30 AM  Referred by:  Physician  Date Referred:  11/15/2012 Referred for  SNF Placement   Other Referral:   Interview type:  Patient Other interview type:   CSW spoke with pt and pt wife    PSYCHOSOCIAL DATA Living Status:  WIFE Admitted from facility:   Level of care:   Primary support name:  Saman Umstead [phone] Primary support relationship to patient:  SPOUSE Degree of support available:   Pt has good support from wife.    CURRENT CONCERNS Current Concerns  Post-Acute Placement   Other Concerns:    SOCIAL WORK ASSESSMENT / PLAN CSW informed that pt is being considered for CIR. CSW initiated assessment for possible back up SNF placement. Pt and pt wife do not believe SNF will be necessary at dc. Pt wife is Agricultural engineer and believes she should be able to help with most care if pt does return home instead of dc to CIR. Pt and pt wife did ask about possibility of home health. CSW provided with brief descrition and informed that RN CM would be in touch to discuss further if necessary at dc. CSW notified RN CM. CSW signing off, please reconsult if new needs arise.   Assessment/plan status:  No Further Intervention Required Other assessment/ plan:   Information/referral to community resources:   None needed at this time    PATIENT'S/FAMILY'S RESPONSE TO PLAN OF CARE: Pt and pt wife not agreeable to SNF at dc.      Christien Frankl, LCSWA [phone]

## 2012-11-15 NOTE — Progress Notes (Signed)
Insurance reviewer says: since pt's encephalopathy has cleared, his insurance will not cover CIR.  SNF will need to be the alternative to d/c home.  Discussed w/ pt's CM, Brenda.  CIR will sign off.  [phone]

## 2012-11-16 DIAGNOSIS — B370 Candidal stomatitis: Secondary | ICD-10-CM | POA: Diagnosis present

## 2012-11-16 LAB — BASIC METABOLIC PANEL
CO2: 26 mEq/L (ref 19–32)
Chloride: 111 mEq/L (ref 96–112)
Creatinine, Ser: 1 mg/dL (ref 0.50–1.35)

## 2012-11-16 LAB — CBC
MCV: 94.2 fL (ref 78.0–100.0)
Platelets: 147 10*3/uL — ABNORMAL LOW (ref 150–400)
RBC: 3.8 MIL/uL — ABNORMAL LOW (ref 4.22–5.81)
WBC: 6.3 10*3/uL (ref 4.0–10.5)

## 2012-11-16 LAB — CULTURE, RESPIRATORY W GRAM STAIN

## 2012-11-16 MED ORDER — SODIUM CHLORIDE 0.9 % IJ SOLN
10.0000 mL | INTRAMUSCULAR | Status: DC | PRN
  Administered 2012-11-16: 10 mL
  Administered 2012-11-17: 20 mL
  Administered 2012-11-17 – 2012-11-18 (×3): 10 mL
  Administered 2012-11-18: 30 mL
  Administered 2012-11-18: 20 mL
  Administered 2012-11-19: 10 mL

## 2012-11-16 MED ORDER — CIPROFLOXACIN HCL 500 MG PO TABS
500.0000 mg | ORAL_TABLET | Freq: Two times a day (BID) | ORAL | Status: DC
  Administered 2012-11-16 – 2012-11-19 (×6): 500 mg via ORAL
  Filled 2012-11-16 (×8): qty 1

## 2012-11-16 NOTE — Clinical Social Work Note (Signed)
CSW received call from patient's nurse indicating that patient has changed his mind and is willing to go to a SNF if CIR unable to take him. Patient clinical information will be faxed out to facilities in [place].  Genelle Bal, MSW, LCSW [phone]

## 2012-11-16 NOTE — Progress Notes (Signed)
[facility] PROGRESS NOTE  Benjamin Park ZOX:[id number] DOB: [birth year] DOA: 11/11/2012 PCP: Lorenda Peck, MD  Assessment/Plan: Principal Problem:   Hypertensive encephalopathy Active Problems:   Hx of transient ischemic attack (TIA)   Hyperlipidemia   Sleep apnea   Hypertension   Labile hypertension   Hypertensive urgency   Acute respiratory failure   Hypertensive emergency     1. Malignant HTN/Acute encephalopathy: Patient has known history of recurrent episodes of hypertensive encephalopathy. He presented with AMS, in the setting of markedly elevated BP of 195/125, consistent with this diagnosis. Managed in the intensive care unit, with aggressive BP control. Patient has now been transitioned to pre-admission oral antihypertensive. Mental status is close to baseline at this time, and BP is reasonably controlled.  2. 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 Patient was commenced on Keppra, accordingly. Brain MRI revealed an unusual pattern of restricted diffusion is most notable in the corpus callosum, possibly related to epileptiform activity. EEG showed generalized slowing consistent with normal sleep, no epileptiform activity.  3. Acute respiratory failure: Patient went into acute hypoxic respiratory failure, due to emesis, AMS/inability to protect airway, in the setting of known OSA, and had to be intubated/Ventilated.  CXR revealed LLL atelectasis. Successfully extubated on 11/13/12, and is now saturating at 97% on RA. Respiratory status has remained stable since. Patient declined nocturnal CPAP. Patient was initially treated with iv Vancomycin/Zosyn, but these were discontinued on 11/13/12. Culture  of tracheal aspirate grew moderate E.Coli and Moderate yeast, consistent with Candida spp. Patient is afebrile, wcc is normal. Have commenced a 7-day course of oral Ciprofloxacin, to be concluded on 11/23/12.  4. Dysphagia: Known prior history of dysphagia. Patient was evaluated by SLP with MBS, which revealed severe pharyngeal phase dysphagia, oral phase dysphagia. Recommended D1/Pudding-thick. No liquids.  5. Oral Candidiasis: An incidental finding on physical examination. As described in # 3 above, culture of tracheal aspirate, grew moderate Candida spp. On day #3/14 Diflucan, to be concluded on 11/27/12. 6. Hypothyroidism: On thyroxine replacement therapy.  7. History of CAD: Stable/No clinical evidence of ACS. 8. Deconditioning: Patient appears significantly deconditioned, due to acute medical issues. Rehab consult requested for possible CIR. Dr Faith Rogue provided consultation, and deemed patient a suitable candidate. However, as insurance has declined, patient will go to [facility], as an alternative option.    Code Status: Full Code.  Family Communication:  Disposition Plan: To be determined.    Brief narrative: 72 yo with past medical history of TIA (2011) and SAH (2011) as well as intermittent episodes of encephalopathy associated with hypertension since 2003, s/p CVA, CAD, s/p MI, s/p PCI, PFO, OSA on CPAP, throat cancer, s/p XRT, dysphagia, hypothyroidism, brought to ED with altered mental status and hypertension. In ED: developed emesis, and was intubated for airway protection. CT revealed no acute findings. Seizures were suspected and Keppra given. PCCM was consulted. Transferred to [facility] service, effective 11/15/12.   Consultants:  Dr Noel Christmas, neurologist.   Procedures:  See Notes.   Antibiotics:  Vancomycin 11/12/12-11/13/12.  Zosyn 11/12/12-11/13/12.   Diflucan 11/14/12>>>  HPI/Subjective: Calm, interactive. No new issues.   Objective: Vital signs in last 24  hours: Temp:  [98.1 F (36.7 C)-98.4 F (36.9 C)] 98.2 F (36.8 C) (08/30 1444) Pulse  Rate:  [73-80] 73 (08/30 1444) Resp:  [18] 18 (08/30 1444) BP: (113-165)/(70-102) 135/86 mmHg (08/30 1444) SpO2:  [99 %] 99 % (08/30 1444) Weight change:  Last BM Date: 11/16/12  Intake/Output from previous day: 08/29 0701 - 08/30 0700 In: 120 [P.O.:120] Out: 200 [Urine:200]     Physical Exam: General: Sitting in chair, comfortable, alert, communicative, oriented, not short of breath at rest.  HEENT:  No clinical pallor, no jaundice, no conjunctival injection or discharge. Mild oral thrush.  NECK:  Supple, JVP not seen, no carotid bruits, no palpable lymphadenopathy, no palpable goiter. CHEST:  Clinically clear to auscultation, no wheezes, no crackles. HEART:  Sounds 1 and 2 heard, normal, regular, no murmurs. ABDOMEN:  Full, soft, non-tender, no palpable organomegaly, no palpable masses, normal bowel sounds. GENITALIA:  Not examined. LOWER EXTREMITIES:  No pitting edema, palpable peripheral pulses. MUSCULOSKELETAL SYSTEM:  Generalized osteoarthritic changes, otherwise, normal. CENTRAL NERVOUS SYSTEM:  No focal neurologic deficit on gross examination.  Lab Results:  Recent Labs  11/14/12 0500 11/16/12 0550  WBC 8.7 6.3  HGB 13.8 12.6*  HCT 37.9* 35.8*  PLT 122* 147*    Recent Labs  11/15/12 0559 11/16/12 0550  NA 143 145  K 3.2* 3.3*  CL 109 111  CO2 25 26  GLUCOSE 106* 105*  BUN 15 21  CREATININE 1.05 1.00  CALCIUM 8.8 8.5   Recent Results (from the past 240 hour(s))  MRSA PCR SCREENING     Status: None   Collection Time    11/11/12  9:27 PM      Result Value Range Status   MRSA by PCR NEGATIVE  NEGATIVE Final   Comment:            The GeneXpert MRSA Assay (FDA     approved for NASAL specimens     only), is one component of a     comprehensive MRSA colonization     surveillance program. It is not     intended to diagnose MRSA     infection nor to guide or      monitor treatment for     MRSA infections.  URINE CULTURE     Status: None   Collection Time    11/11/12  9:27 PM      Result Value Range Status   Specimen Description URINE, CATHETERIZED   Final   Special Requests ADDED [id number]   Final   Culture  Setup Time     Final   Value: 11/12/2012 13:02     Performed at [facility]   Colony Count     Final   Value: NO GROWTH     Performed at [facility]   Culture     Final   Value: NO GROWTH     Performed at [facility]   Report Status 11/13/2012 FINAL   Final  CULTURE, BLOOD (ROUTINE X 2)     Status: None   Collection Time    11/12/12  6:30 AM      Result Value Range Status   Specimen Description BLOOD RIGHT ANTECUBITAL   Final   Special Requests BOTTLES DRAWN AEROBIC AND ANAEROBIC   Final   Culture  Setup Time     Final   Value: 11/12/2012 11:21     Performed at [facility]   Culture     Final   Value:        BLOOD CULTURE RECEIVED NO GROWTH TO  DATE CULTURE WILL BE HELD FOR 5 DAYS BEFORE ISSUING A FINAL NEGATIVE REPORT     Performed at [facility]   Report Status PENDING   Incomplete  CULTURE, BLOOD (ROUTINE X 2)     Status: None   Collection Time    11/12/12  6:50 AM      Result Value Range Status   Specimen Description BLOOD LEFT HAND   Final   Special Requests BOTTLES DRAWN AEROBIC ONLY 1CC   Final   Culture  Setup Time     Final   Value: 11/12/2012 11:21     Performed at [facility]   Culture     Final   Value:        BLOOD CULTURE RECEIVED NO GROWTH TO DATE CULTURE WILL BE HELD FOR 5 DAYS BEFORE ISSUING A FINAL NEGATIVE REPORT     Performed at [facility]   Report Status PENDING   Incomplete  CULTURE, RESPIRATORY (NON-EXPECTORATED)     Status: None   Collection Time    11/12/12  7:51 PM      Result Value Range Status   Specimen Description TRACHEAL ASPIRATE   Final   Special Requests NONE   Final   Gram Stain     Final   Value: MODERATE WBC  PRESENT,BOTH PMN AND MONONUCLEAR     NO SQUAMOUS EPITHELIAL CELLS SEEN     RARE GRAM NEGATIVE RODS     RARE YEAST     Performed at [facility]   Culture     Final   Value: MODERATE ESCHERICHIA COLI     MODERATE YEAST CONSISTENT WITH CANDIDA SPECIES     Performed at [facility]   Report Status 11/16/2012 FINAL   Final   Organism ID, Bacteria ESCHERICHIA COLI   Final     Studies/Results: No results found.  Medications: Scheduled Meds: . aspirin  325 mg Oral Daily  . ciprofloxacin  500 mg Oral BID  . feeding supplement  1 Container Oral TID BM  . fluconazole  100 mg Oral Daily  . irbesartan  150 mg Oral Daily  . labetalol  200 mg Oral BID  . levETIRAcetam  500 mg Oral BID  . levothyroxine  100 mcg Oral QAC breakfast  . multivitamin with minerals  1 tablet Oral Daily  . simvastatin  40 mg Oral q1800  . sodium chloride  3 mL Intravenous Q12H   Continuous Infusions: . dextrose 5 % and 0.45 % NaCl with KCl 20 mEq/L 50 mL/hr at 11/15/12 1254   PRN Meds:.acetaminophen (TYLENOL) oral liquid 160 mg/5 mL, food thickener, labetalol, sodium chloride    LOS: 5 days   Summerlyn Park,Benjamin  [facility] Pager [phone]. If 8PM-8AM, please contact night-coverage at [email or web address], password [facility] 11/16/2012, 4:48 PM  LOS: 5 days

## 2012-11-16 NOTE — Progress Notes (Addendum)
Speech Language Pathology Dysphagia Treatment Patient Details Name: Benjamin Park MRN: [id number] DOB: Nov 24, 1940 Today's Date: 11/16/2012 Time: 1300-1330 SLP Time Calculation (min): 30 min  Assessment / Plan / Recommendation Clinical Impression  F/u for diet tolerance of dysphagia 1 (puree) consistency and pudding thick liquids as recommended from MBS completed on 11/15/12.  Observed directly with puree trials.  Moderate verbal cues required for patient to complete recommended strategy of double swallow per bite.  No outward s/s of aspiration noted with trials.   Recommend to continue current diet consistency with full supervision to cue patient as needed.  ST to follow on 11/17/12  for diet tolerance and possible diet advancement.      Diet Recommendation  Continue with Current Diet: Dysphagia 1 (puree);Pudding-thick liquid    SLP Plan Continue with current plan of care      Swallowing Goals  SLP Swallowing Goals Swallow Study Goal #2 - Progress: Progressing toward goal  General Respiratory Status: Room air Behavior/Cognition: Alert;Cooperative;Pleasant mood;Requires cueing Oral Cavity - Dentition: Edentulous Patient Positioning: Upright in chair  Oral Cavity - Oral Hygiene Does patient have any of the following "at risk" factors?: Other - dysphagia Patient is HIGH RISK - Oral Care Protocol followed (see row info): Yes Patient is AT RISK - Oral Care Protocol followed (see row info): Yes   Dysphagia Treatment Treatment focused on: Skilled observation of diet tolerance;Facilitation of oral phase;Facilitation of pharyngeal phase;Utilization of compensatory strategies;Patient/family/caregiver education Family/Caregiver Educated: Spouse  Treatment Methods/Modalities: Skilled observation;Differential diagnosis Patient observed directly with PO's: Yes Type of PO's observed: Dysphagia 1 (puree) Feeding: Able to feed self Type of cueing: Verbal Amount of cueing: Moderate   GO     Moreen Fowler MS, CCC-SLP [phone] [facility], [place] 11/16/2012, 1:54 PM

## 2012-11-17 LAB — BASIC METABOLIC PANEL
BUN: 19 mg/dL (ref 6–23)
CO2: 26 mEq/L (ref 19–32)
Chloride: 107 mEq/L (ref 96–112)
Creatinine, Ser: 0.95 mg/dL (ref 0.50–1.35)
Glucose, Bld: 102 mg/dL — ABNORMAL HIGH (ref 70–99)

## 2012-11-17 LAB — CBC
HCT: 35.8 % — ABNORMAL LOW (ref 39.0–52.0)
Hemoglobin: 12.5 g/dL — ABNORMAL LOW (ref 13.0–17.0)
MCV: 94.2 fL (ref 78.0–100.0)
RBC: 3.8 MIL/uL — ABNORMAL LOW (ref 4.22–5.81)
RDW: 13.1 % (ref 11.5–15.5)
WBC: 6.6 10*3/uL (ref 4.0–10.5)

## 2012-11-17 MED ORDER — ENOXAPARIN SODIUM 40 MG/0.4ML ~~LOC~~ SOLN
40.0000 mg | SUBCUTANEOUS | Status: DC
  Administered 2012-11-17 – 2012-11-18 (×2): 40 mg via SUBCUTANEOUS
  Filled 2012-11-17 (×3): qty 0.4

## 2012-11-17 MED ORDER — POTASSIUM CHLORIDE CRYS ER 20 MEQ PO TBCR
EXTENDED_RELEASE_TABLET | ORAL | Status: AC
  Administered 2012-11-17: 40 meq via ORAL
  Filled 2012-11-17: qty 2

## 2012-11-17 MED ORDER — POTASSIUM CHLORIDE CRYS ER 20 MEQ PO TBCR
40.0000 meq | EXTENDED_RELEASE_TABLET | Freq: Once | ORAL | Status: AC
  Administered 2012-11-17: 40 meq via ORAL

## 2012-11-17 NOTE — Progress Notes (Signed)
SLP Cancellation Note  Patient Details Name: BENEDETTO RYDER MRN: [id number] DOB: 10-19-1940   Cancelled treatment:   ST to f/u on 11/18/12 for diet tolerance and possible advancement.  Moreen Fowler MS, CCC-SLP [facility] 11/17/2012, 6:47 PM

## 2012-11-17 NOTE — Progress Notes (Signed)
[facility] PROGRESS NOTE  [name]:[id number] DOB: Dec 17, 1940 DOA: 11/11/2012 PCP: Lorenda Peck, MD  Assessment/Plan: Principal Problem:   Hypertensive encephalopathy Active Problems:   Hx of transient ischemic attack (TIA)   Hyperlipidemia   Sleep apnea   Hypertension   Labile hypertension   Hypertensive urgency   Acute respiratory failure   Hypertensive emergency   Oral thrush     1. Malignant HTN/Acute encephalopathy: Patient has known history of recurrent episodes of hypertensive encephalopathy. He presented with AMS, in the setting of markedly elevated BP of 195/125, consistent with this diagnosis. Managed in the intensive care unit, with aggressive BP control. Patient has now been transitioned to pre-admission oral antihypertensives, adjusted as indicated. Mental status is close to baseline at this time, and BP is reasonably controlled.  2. 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 Patient was commenced on Keppra, accordingly. Brain MRI revealed an unusual pattern of restricted diffusion is most notable in the corpus callosum, possibly related to epileptiform activity. EEG showed generalized slowing consistent with normal sleep, no epileptiform activity.  3. Acute respiratory failure: Patient went into acute hypoxic respiratory failure, due to emesis, AMS/inability to protect airway, in the setting of known OSA, and had to be intubated/Ventilated.  CXR revealed LLL atelectasis. Successfully extubated on 11/13/12, and is now saturating at 97% on RA. Respiratory status has remained stable since. Patient declined nocturnal CPAP. Patient was initially treated with iv Vancomycin/Zosyn, but these  were discontinued on 11/13/12. Culture of tracheal aspirate grew moderate E.Coli and Moderate yeast, consistent with Candida spp. Patient is afebrile, wcc is normal. Have commenced a 7-day course of oral Ciprofloxacin, to be concluded on 11/23/12. Now day #2.  4. Dysphagia: Known prior history of dysphagia. Patient was evaluated by SLP with MBS, which revealed severe pharyngeal phase dysphagia, oral phase dysphagia. Recommended D1/Pudding-thick. No liquids.  5. Oral Candidiasis: An incidental finding on physical examination. As described in # 3 above, culture of tracheal aspirate, grew moderate Candida spp. On day #4/14 Diflucan, to be concluded on 11/27/12. 6. Hypothyroidism: On thyroxine replacement therapy.  7. History of CAD: Stable/No clinical evidence of ACS. 8. Deconditioning: Patient appears significantly deconditioned, due to acute medical issues. Rehab consult requested for possible CIR. Dr Faith Rogue provided consultation, and deemed patient a suitable candidate. However, as insurance has declined, patient will go to [facility], as an alternative option.    Code Status: Full Code.  Family Communication:  Disposition Plan: To be determined.    Brief narrative: 72 yo with past medical history of TIA (2011) and SAH (2011) as well as intermittent episodes of encephalopathy associated with hypertension since 2003, s/p CVA, CAD, s/p MI, s/p PCI, PFO, OSA on CPAP, throat cancer, s/p XRT, dysphagia, hypothyroidism, brought to ED with altered mental status and hypertension. In ED: developed emesis, and was intubated for airway protection. CT revealed no acute findings. Seizures were suspected and Keppra given. PCCM was consulted. Transferred to [facility] service, effective 11/15/12.   Consultants:  Dr Noel Christmas, neurologist.   Procedures:  See Notes.   Antibiotics:  Vancomycin 11/12/12-11/13/12.  Zosyn 11/12/12-11/13/12.   Diflucan 11/14/12>>>  Ciprofloxacin 11/16/12>>>  HPI/Subjective: No  new issues.   Objective: Vital signs in last 24 hours: Temp:  [97.8 F (36.6  C)-98.2 F (36.8 C)] 97.8 F (36.6 C) (08/31 0539) Pulse Rate:  [67-73] 67 (08/31 0952) Resp:  [16-18] 18 (08/31 0539) BP: (120-178)/(70-101) 120/70 mmHg (08/31 0952) SpO2:  [96 %-100 %] 96 % (08/31 0539) Weight change:  Last BM Date: 11/16/12  Intake/Output from previous day: 08/30 0701 - 08/31 0700 In: -  Out: 200 [Urine:200]     Physical Exam: General: Comfortable, alert, communicative, oriented, not short of breath at rest.  HEENT:  No clinical pallor, no jaundice, no conjunctival injection or discharge. Mild oral thrush.  NECK:  Supple, JVP not seen, no carotid bruits, no palpable lymphadenopathy, no palpable goiter. CHEST:  Clinically clear to auscultation, no wheezes, no crackles. HEART:  Sounds 1 and 2 heard, normal, regular, no murmurs. ABDOMEN:  Full, soft, non-tender, no palpable organomegaly, no palpable masses, normal bowel sounds. GENITALIA:  Not examined. LOWER EXTREMITIES:  No pitting edema, palpable peripheral pulses. MUSCULOSKELETAL SYSTEM:  Generalized osteoarthritic changes, otherwise, normal. CENTRAL NERVOUS SYSTEM:  No focal neurologic deficit on gross examination.  Lab Results:  Recent Labs  11/16/12 0550 11/17/12 0545  WBC 6.3 6.6  HGB 12.6* 12.5*  HCT 35.8* 35.8*  PLT 147* 152    Recent Labs  11/16/12 0550 11/17/12 0545  NA 145 140  K 3.3* 3.3*  CL 111 107  CO2 26 26  GLUCOSE 105* 102*  BUN 21 19  CREATININE 1.00 0.95  CALCIUM 8.5 8.6   Recent Results (from the past 240 hour(s))  MRSA PCR SCREENING     Status: None   Collection Time    11/11/12  9:27 PM      Result Value Range Status   MRSA by PCR NEGATIVE  NEGATIVE Final   Comment:            The GeneXpert MRSA Assay (FDA     approved for NASAL specimens     only), is one component of a     comprehensive MRSA colonization     surveillance program. It is not     intended to diagnose MRSA      infection nor to guide or     monitor treatment for     MRSA infections.  URINE CULTURE     Status: None   Collection Time    11/11/12  9:27 PM      Result Value Range Status   Specimen Description URINE, CATHETERIZED   Final   Special Requests ADDED [id number]   Final   Culture  Setup Time     Final   Value: 11/12/2012 13:02     Performed at [facility]   Colony Count     Final   Value: NO GROWTH     Performed at [facility]   Culture     Final   Value: NO GROWTH     Performed at [facility]   Report Status 11/13/2012 FINAL   Final  CULTURE, BLOOD (ROUTINE X 2)     Status: None   Collection Time    11/12/12  6:30 AM      Result Value Range Status   Specimen Description BLOOD RIGHT ANTECUBITAL   Final   Special Requests BOTTLES DRAWN AEROBIC AND ANAEROBIC   Final   Culture  Setup Time     Final   Value: 11/12/2012 11:21     Performed at [facility]   Culture     Final   Value:  BLOOD CULTURE RECEIVED NO GROWTH TO DATE CULTURE WILL BE HELD FOR 5 DAYS BEFORE ISSUING A FINAL NEGATIVE REPORT     Performed at [facility]   Report Status PENDING   Incomplete  CULTURE, BLOOD (ROUTINE X 2)     Status: None   Collection Time    11/12/12  6:50 AM      Result Value Range Status   Specimen Description BLOOD LEFT HAND   Final   Special Requests BOTTLES DRAWN AEROBIC ONLY 1CC   Final   Culture  Setup Time     Final   Value: 11/12/2012 11:21     Performed at [facility]   Culture     Final   Value:        BLOOD CULTURE RECEIVED NO GROWTH TO DATE CULTURE WILL BE HELD FOR 5 DAYS BEFORE ISSUING A FINAL NEGATIVE REPORT     Performed at [facility]   Report Status PENDING   Incomplete  CULTURE, RESPIRATORY (NON-EXPECTORATED)     Status: None   Collection Time    11/12/12  7:51 PM      Result Value Range Status   Specimen Description TRACHEAL ASPIRATE   Final   Special Requests NONE   Final   Gram Stain     Final    Value: MODERATE WBC PRESENT,BOTH PMN AND MONONUCLEAR     NO SQUAMOUS EPITHELIAL CELLS SEEN     RARE GRAM NEGATIVE RODS     RARE YEAST     Performed at [facility]   Culture     Final   Value: MODERATE ESCHERICHIA COLI     MODERATE YEAST CONSISTENT WITH CANDIDA SPECIES     Performed at [facility]   Report Status 11/16/2012 FINAL   Final   Organism ID, Bacteria ESCHERICHIA COLI   Final     Studies/Results: No results found.  Medications: Scheduled Meds: . aspirin  325 mg Oral Daily  . ciprofloxacin  500 mg Oral BID  . feeding supplement  1 Container Oral TID BM  . fluconazole  100 mg Oral Daily  . irbesartan  150 mg Oral Daily  . labetalol  200 mg Oral BID  . levETIRAcetam  500 mg Oral BID  . levothyroxine  100 mcg Oral QAC breakfast  . multivitamin with minerals  1 tablet Oral Daily  . simvastatin  40 mg Oral q1800  . sodium chloride  3 mL Intravenous Q12H   Continuous Infusions: . dextrose 5 % and 0.45 % NaCl with KCl 20 mEq/L 50 mL/hr at 11/17/12 0505   PRN Meds:.acetaminophen (TYLENOL) oral liquid 160 mg/5 mL, food thickener, labetalol, sodium chloride    LOS: 6 days   Chaos Carlile,CHRISTOPHER  [facility] Pager [phone]. If 8PM-8AM, please contact night-coverage at [email or web address], password [facility] 11/17/2012, 1:02 PM  LOS: 6 days

## 2012-11-17 NOTE — Progress Notes (Signed)
Pt has no VTE prophylaxis. Pt also has a k level at 3.3

## 2012-11-17 NOTE — Progress Notes (Signed)
ANTICOAGULATION CONSULT NOTE - Initial Consult  Pharmacy Consult for Enoxaparin Indication: VTE prophylaxis  No Known Allergies  Patient Measurements: Height: 6' (182.9 cm) Weight: 172 lb 9.6 oz (78.291 kg) IBW/kg (Calculated) : 77.6   Vital Signs: Temp: 97.8 F (36.6 C) (08/31 0539) Temp src: Oral (08/31 0539) BP: 120/70 mmHg (08/31 0952) Pulse Rate: 67 (08/31 0952)  Labs:  Recent Labs  11/15/12 0559 11/16/12 0550 11/17/12 0545  HGB  --  12.6* 12.5*  HCT  --  35.8* 35.8*  PLT  --  147* 152  CREATININE 1.05 1.00 0.95    Estimated Creatinine Clearance: 78.3 ml/min (by C-G formula based on Cr of 0.95).   Medical History: Past Medical History  Diagnosis Date  . Hypertension   . Hx of transient ischemic attack (TIA)   . Hyperlipidemia   . Coronary artery disease   . Throat cancer     radiation therapy  . Hypertensive encephalopathy   . ED (erectile dysfunction)   . Personal history of kidney stones   . Subarachnoid hemorrhage   . Sleep apnea     on CPAP  . Stroke   . Myocardial infarction     12/1991  . Headache(784.0)   . Dysphagia   . Hypokalemia   . AKI (acute kidney injury) 02/23/2011  . Hypothyroidism   . Unspecified cerebral artery occlusion with cerebral infarction 11/08/2012    Medications:  Prescriptions prior to admission  Medication Sig Dispense Refill  . aspirin 325 MG tablet Take 325 mg by mouth daily.        . chlorthalidone (HYGROTON) 25 MG tablet Take 25 mg by mouth daily.      . cloNIDine (CATAPRES) 0.2 MG tablet Take 0.1 mg by mouth 2 (two) times daily as needed (for blood pressure greater than 160/100).      . fish oil-omega-3 fatty acids 1000 MG capsule Take 1 g by mouth daily.       Marland Kitchen GLUCOSAMINE PO Take 1 tablet by mouth daily.       Marland Kitchen labetalol (NORMODYNE) 100 MG tablet Take 100 mg by mouth 2 (two) times daily.        Marland Kitchen levothyroxine (SYNTHROID, LEVOTHROID) 100 MCG tablet Take 100 mcg by mouth daily.        Marland Kitchen lovastatin  (MEVACOR) 40 MG tablet Take 80 mg by mouth at bedtime.       . Multiple Vitamin (MULITIVITAMIN WITH MINERALS) TABS Take 1 tablet by mouth daily.        Marland Kitchen olmesartan (BENICAR) 20 MG tablet Take 20 mg by mouth at bedtime.      Marland Kitchen testosterone cypionate (DEPOTESTOTERONE CYPIONATE) 200 MG/ML injection Inject 200 mg into the muscle every 28 (twenty-eight) days.       Marland Kitchen topiramate (TOPAMAX) 25 MG tablet Take 25-50 mg by mouth at bedtime. Take 1 tablet for 1 week starting 11/08/12 then increase to 2 tablets every night        Assessment: 72 yo m brought to ED with AMS and HTN. Developed emesis in ED and intubated with possible seizures suspected.  LMWH was started for VTE ppx for pharmacy dosing.  Goal of Therapy:  Anti-Xa level 0.6-1.2 units/ml 4hrs after LMWH dose given Monitor platelets by anticoagulation protocol: Yes   Plan:  -Start Enoxaparin 40 mg q24h -Monitor Scr, Crcl, CBC, and s/sx of bleeding  Anabel Bene 11/17/2012,1:24 PM  Anabel Bene, PharmD Clinical Pharmacist Pager: [phone]

## 2012-11-18 LAB — CULTURE, BLOOD (ROUTINE X 2): Culture: NO GROWTH

## 2012-11-18 LAB — CBC
MCH: 32.7 pg (ref 26.0–34.0)
MCV: 95.2 fL (ref 78.0–100.0)
Platelets: 149 10*3/uL — ABNORMAL LOW (ref 150–400)
RDW: 13.3 % (ref 11.5–15.5)

## 2012-11-18 LAB — BASIC METABOLIC PANEL
BUN: 18 mg/dL (ref 6–23)
CO2: 26 mEq/L (ref 19–32)
Calcium: 8.6 mg/dL (ref 8.4–10.5)
Creatinine, Ser: 1 mg/dL (ref 0.50–1.35)
Glucose, Bld: 101 mg/dL — ABNORMAL HIGH (ref 70–99)

## 2012-11-18 MED ORDER — STROKE: EARLY STAGES OF RECOVERY BOOK
Freq: Once | Status: AC
  Administered 2012-11-19: 08:00:00
  Filled 2012-11-18: qty 1

## 2012-11-18 NOTE — Progress Notes (Addendum)
Clinical Social Work Department CLINICAL SOCIAL WORK PLACEMENT NOTE 11/18/2012  Patient:  Benjamin Park, Benjamin Park  Account Number:  [id number] Admit date:  11/11/2012  Clinical Social Worker:  Sharol Harness, Theresia Majors  Date/time:  11/18/2012 02:15 PM  Clinical Social Work is seeking post-discharge placement for this patient at the following level of care:   SKILLED NURSING   (*CSW will update this form in Epic as items are completed)   11/16/2012  Patient/family provided with [facility] Department of Clinical Social Work's list of facilities offering this level of care within the geographic area requested by the patient (or if unable, by the patient's family).  11/16/2012  Patient/family informed of their freedom to choose among providers that offer the needed level of care, that participate in Medicare, Medicaid or managed care program needed by the patient, have an available bed and are willing to accept the patient.  11/16/2012  Patient/family informed of [facility]' ownership interest in [facility], as well as of the fact that they are under no obligation to receive care at this facility.  PASARR submitted to EDS on 11/16/2012 PASARR number received from EDS on 11/16/2012  FL2 transmitted to all facilities in geographic area requested by pt/family on  11/16/2012 FL2 transmitted to all facilities within larger geographic area on 11/16/2012  Patient informed that his/her managed care company has contracts with or will negotiate with  certain facilities, including the following:     Patient/family informed of bed offers received:  11/18/2012 Patient chooses bed at [facility] Physician recommends and patient chooses bed at    Patient to be transferred to [facility] on 11/19/2012   Patient to be transferred to facility by family  The following physician request were entered in Epic:   Additional Comments:  Dorion Petillo, LCSWA [phone]

## 2012-11-18 NOTE — Progress Notes (Addendum)
Speech Language Pathology Dysphagia Treatment Patient Details Name: Benjamin Park MRN: [id number] DOB: Dec 21, 1940 Today's Date: 11/18/2012 Time: 4098-1191 SLP Time Calculation (min): 26 min  Assessment / Plan / Recommendation Clinical Impression  Pt demonstrated improved vocal quality compared to previous report. Pt did not demonstrate s/s of aspiration with trials of nectar thick liquids or regular solids. Did demonstrate s/s of aspiration (wet vocal quality and delayed cough) with thin. Suspect mildly delayed swallow. Pt does have history of dysphagia; reported that he was supposed to be using thickener but usually didn't at home. Given history and current status, pt at high risk of aspiration of thin liquids, but this risk is decreased with nectar-thick liquids and use of strategies. Rx regular diet with nectar thick liquids; meds whole with puree; cont with multiple swallow strategy and small sips. Will continue to follow for diet tolerance/ advancement.    Diet Recommendation  Initiate / Change Diet: Regular;Nectar-thick liquid    SLP Plan Goals updated   Pertinent Vitals/Pain n/a   Swallowing Goals  SLP Swallowing Goals Patient will consume recommended diet without observed clinical signs of aspiration with: Moderate assistance Swallow Study Goal #1 - Progress: Progressing toward goal Patient will utilize recommended strategies during swallow to increase swallowing safety with: Moderate assistance Swallow Study Goal #2 - Progress: Progressing toward goal  General Temperature Spikes Noted: No Respiratory Status: Room air Behavior/Cognition: Alert;Cooperative;Pleasant mood Oral Cavity - Dentition: Dentures, top;Dentures, bottom Patient Positioning: Upright in chair  Oral Cavity - Oral Hygiene Does patient have any of the following "at risk" factors?: Other - dysphagia Brush patient's teeth BID with toothbrush (using toothpaste with fluoride): Yes Patient is AT RISK - Oral Care  Protocol followed (see row info): Yes   Dysphagia Treatment Treatment focused on: Skilled observation of diet tolerance;Upgraded PO texture trials;Patient/family/caregiver education Family/Caregiver Educated: Spouse  Treatment Methods/Modalities: Skilled observation;Differential diagnosis Patient observed directly with PO's: Yes Type of PO's observed: Regular;Dysphagia 1 (puree);Thin liquids;Nectar-thick liquids Feeding: Able to feed self Liquids provided via: Cup;No straw Pharyngeal Phase Signs & Symptoms: Suspected delayed swallow initiation;Multiple swallows;Delayed cough Type of cueing: Verbal Amount of cueing: Minimal   GO     Metro Kung, MA, CCC-SLP 11/18/2012, 5:10 PM

## 2012-11-18 NOTE — Progress Notes (Signed)
[facility] PROGRESS NOTE  YOUCEF KLAS ZOX:[id number] DOB: March 11, 1941 DOA: 11/11/2012 PCP: Lorenda Peck, MD  Assessment/Plan: Principal Problem:   Hypertensive encephalopathy Active Problems:   Hx of transient ischemic attack (TIA)   Hyperlipidemia   Sleep apnea   Hypertension   Labile hypertension   Hypertensive urgency   Acute respiratory failure   Hypertensive emergency   Oral thrush     1. Malignant HTN/Acute encephalopathy: Patient has known history of recurrent episodes of hypertensive encephalopathy. He presented with AMS, in the setting of markedly elevated BP of 195/125, consistent with this diagnosis. Managed in the intensive care unit, with aggressive BP control. Patient has now been transitioned to pre-admission oral antihypertensives, adjusted as indicated. Mental status is close to baseline at this time, and BP is controlled.  2. 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 Patient was commenced on Keppra, accordingly. Brain MRI revealed an unusual pattern of restricted diffusion is most notable in the corpus callosum, possibly related to epileptiform activity. EEG showed generalized slowing consistent with normal sleep, no epileptiform activity. Stable.  3. Acute respiratory failure: Patient went into acute hypoxic respiratory failure, due to emesis, AMS/inability to protect airway, in the setting of known OSA, and had to be intubated/Ventilated.  CXR revealed LLL atelectasis. Successfully extubated on 11/13/12, and is now saturating at 97% on RA. Respiratory status has remained stable since. Patient declined nocturnal CPAP. Patient was initially treated with iv Vancomycin/Zosyn, but these  were discontinued on 11/13/12. Culture of tracheal aspirate grew moderate E.Coli and Moderate yeast, consistent with Candida spp. Patient is afebrile, wcc is normal. Placed on a 7-day course of oral Ciprofloxacin, to be concluded on 11/23/12. Now day #3.  4. Dysphagia: Known prior history of dysphagia. Patient was evaluated by SLP with MBS, which revealed severe pharyngeal phase dysphagia, oral phase dysphagia. Recommended D1/Pudding-thick, no liquids initially, but following repeat SLP evaluation on 11/18/12, has been upgraded to Nectar-thick liquids.  5. Oral Candidiasis: An incidental finding on physical examination. As described in # 3 above, culture of tracheal aspirate, grew moderate Candida spp. On day #5/14 Diflucan, to be concluded on 11/27/12. 6. Hypothyroidism: On thyroxine replacement therapy.  7. History of CAD: Stable/No clinical evidence of ACS. 8. Deconditioning: Patient appears significantly deconditioned, due to acute medical issues. Rehab consult requested for possible CIR. Dr Faith Rogue provided consultation, and deemed patient a suitable candidate. However, as insurance has declined, patient will go to [facility], as an alternative option.    Code Status: Full Code.  Family Communication:  Disposition Plan: To be determined. Stable for discharge, possibly on 11/19/12.    Brief narrative: 72 yo with past medical history of TIA (2011) and SAH (2011) as well as intermittent episodes of encephalopathy associated with hypertension since 2003, s/p CVA, CAD, s/p MI, s/p PCI, PFO, OSA on CPAP, throat cancer, s/p XRT, dysphagia, hypothyroidism, brought to ED with altered mental status and hypertension. In ED: developed emesis, and was intubated for airway protection. CT revealed no acute findings. Seizures were suspected and Keppra given. PCCM was consulted. Transferred to [facility] service, effective 11/15/12.   Consultants:  Dr Noel Christmas, neurologist.   Procedures:  See Notes.    Antibiotics:  Vancomycin 11/12/12-11/13/12.  Zosyn 11/12/12-11/13/12.   Diflucan 11/14/12>>>  Ciprofloxacin  11/16/12>>>  HPI/Subjective: No new issues.   Objective: Vital signs in last 24 hours: Temp:  [98 F (36.7 C)-98.4 F (36.9 C)] 98.4 F (36.9 C) (09/01 0517) Pulse Rate:  [72-79] 73 (09/01 1027) Resp:  [16] 16 (09/01 0517) BP: (106-136)/(70-96) 120/70 mmHg (09/01 1027) SpO2:  [98 %-100 %] 98 % (09/01 0517) Weight change:  Last BM Date: 11/17/12  Intake/Output from previous day: 08/31 0701 - 09/01 0700 In: 240 [P.O.:240] Out: 500 [Urine:500] Total I/O In: 500 [P.O.:500] Out: -    Physical Exam: General: Comfortable, alert, communicative, oriented, not short of breath at rest.  HEENT:  No clinical pallor, no jaundice, no conjunctival injection or discharge. Mild oral thrush.  NECK:  Supple, JVP not seen, no carotid bruits, no palpable lymphadenopathy, no palpable goiter. CHEST:  Clinically clear to auscultation, no wheezes, no crackles. HEART:  Sounds 1 and 2 heard, normal, regular, no murmurs. ABDOMEN:  Full, soft, non-tender, no palpable organomegaly, no palpable masses, normal bowel sounds. GENITALIA:  Not examined. LOWER EXTREMITIES:  No pitting edema, palpable peripheral pulses. MUSCULOSKELETAL SYSTEM:  Generalized osteoarthritic changes, otherwise, normal. CENTRAL NERVOUS SYSTEM:  No focal neurologic deficit on gross examination.  Lab Results:  Recent Labs  11/17/12 0545 11/18/12 0455  WBC 6.6 6.9  HGB 12.5* 12.2*  HCT 35.8* 35.5*  PLT 152 149*    Recent Labs  11/17/12 0545 11/18/12 0455  NA 140 143  K 3.3* 3.8  CL 107 109  CO2 26 26  GLUCOSE 102* 101*  BUN 19 18  CREATININE 0.95 1.00  CALCIUM 8.6 8.6   Recent Results (from the past 240 hour(s))  MRSA PCR SCREENING     Status: None   Collection Time    11/11/12  9:27 PM      Result Value Range Status   MRSA by PCR NEGATIVE  NEGATIVE Final   Comment:            The GeneXpert MRSA  Assay (FDA     approved for NASAL specimens     only), is one component of a     comprehensive MRSA colonization     surveillance program. It is not     intended to diagnose MRSA     infection nor to guide or     monitor treatment for     MRSA infections.  URINE CULTURE     Status: None   Collection Time    11/11/12  9:27 PM      Result Value Range Status   Specimen Description URINE, CATHETERIZED   Final   Special Requests ADDED [id number]   Final   Culture  Setup Time     Final   Value: 11/12/2012 13:02     Performed at [facility]   Colony Count     Final   Value: NO GROWTH     Performed at [facility]   Culture     Final   Value: NO GROWTH     Performed at [facility]   Report Status 11/13/2012 FINAL   Final  CULTURE, BLOOD (ROUTINE X 2)     Status: None   Collection Time    11/12/12  6:30 AM      Result Value Range Status   Specimen Description BLOOD RIGHT ANTECUBITAL   Final   Special Requests BOTTLES DRAWN AEROBIC AND ANAEROBIC   Final   Culture  Setup Time     Final   Value:  11/12/2012 11:21     Performed at [facility]   Culture     Final   Value: NO GROWTH 5 DAYS     Performed at [facility]   Report Status 11/18/2012 FINAL   Final  CULTURE, BLOOD (ROUTINE X 2)     Status: None   Collection Time    11/12/12  6:50 AM      Result Value Range Status   Specimen Description BLOOD LEFT HAND   Final   Special Requests BOTTLES DRAWN AEROBIC ONLY 1CC   Final   Culture  Setup Time     Final   Value: 11/12/2012 11:21     Performed at [facility]   Culture     Final   Value: NO GROWTH 5 DAYS     Performed at [facility]   Report Status 11/18/2012 FINAL   Final  CULTURE, RESPIRATORY (NON-EXPECTORATED)     Status: None   Collection Time    11/12/12  7:51 PM      Result Value Range Status   Specimen Description TRACHEAL ASPIRATE   Final   Special Requests NONE   Final   Gram Stain     Final    Value: MODERATE WBC PRESENT,BOTH PMN AND MONONUCLEAR     NO SQUAMOUS EPITHELIAL CELLS SEEN     RARE GRAM NEGATIVE RODS     RARE YEAST     Performed at [facility]   Culture     Final   Value: MODERATE ESCHERICHIA COLI     MODERATE YEAST CONSISTENT WITH CANDIDA SPECIES     Performed at [facility]   Report Status 11/16/2012 FINAL   Final   Organism ID, Bacteria ESCHERICHIA COLI   Final     Studies/Results: No results found.  Medications: Scheduled Meds: . aspirin  325 mg Oral Daily  . ciprofloxacin  500 mg Oral BID  . enoxaparin (LOVENOX) injection  40 mg Subcutaneous Q24H  . feeding supplement  1 Container Oral TID BM  . fluconazole  100 mg Oral Daily  . irbesartan  150 mg Oral Daily  . labetalol  200 mg Oral BID  . levETIRAcetam  500 mg Oral BID  . levothyroxine  100 mcg Oral QAC breakfast  . multivitamin with minerals  1 tablet Oral Daily  . simvastatin  40 mg Oral q1800  . sodium chloride  3 mL Intravenous Q12H   Continuous Infusions: . dextrose 5 % and 0.45 % NaCl with KCl 20 mEq/L 50 mL/hr at 11/17/12 2338   PRN Meds:.acetaminophen (TYLENOL) oral liquid 160 mg/5 mL, food thickener, labetalol, sodium chloride    LOS: 7 days   Ronelle Michie,CHRISTOPHER  [facility] Pager [phone]. If 8PM-8AM, please contact night-coverage at [email or web address], password [facility] 11/18/2012, 5:43 PM  LOS: 7 days

## 2012-11-18 NOTE — Progress Notes (Signed)
Physical Therapy Treatment Patient Details Name: Benjamin Park MRN: [id number] DOB: 05/23/1940 Today's Date: 11/18/2012 Time: 4098-1191 PT Time Calculation (min): 23 min  PT Assessment / Plan / Recommendation  History of Present Illness 72 y.o. male admitted to [facility] on 11/11/12 with past medical history of TIA (2011) and SAH (2011) as well as intermittent episodes of encephalopathy associated with hypertension since 2003 brought to ED with altered mental status and hypertension. In ED: developed emesis - intubated for airway protection. CT revealed no acute findings.  MRI revealed Unusual pattern of restricted diffusion is most notable in the corpus callosum. 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 The pattern of diffusion and T2 signal abnormality is not suggestive of an ischemic process. Advanced atrophy and chronic microvascular ischemic change.  CXR Some increase in left basilar airspace disease and small effusion.  Pt extubated 11/13/12.     PT Comments   The pt continues to be a very high risk of falls without someone with him to cue him for safety and help stabilize him for balance, especially when turning corners or turning 180-360 degrees.  Wife is present and observing session today.  She reports up to 2 falls per day.  He continues to be appropriate for rehab.  Follow Up Recommendations  CIR     Does the patient have the potential to tolerate intense rehabilitation    Yes  Barriers to Discharge   None      Equipment Recommendations  Rolling walker with 5" wheels    Recommendations for Other Services Rehab consult  Frequency Min 3X/week   Progress towards PT Goals Progress towards PT goals: Progressing toward goals  Plan Current plan remains appropriate    Precautions / Restrictions Precautions Precautions: Fall Precaution Comments: wife reports very frequent falls at home, sometimes twice a day   Pertinent Vitals/Pain See vitals flow sheet.    Mobility  Bed  Mobility Bed Mobility: Not assessed (pt seated in chair ) Transfers Sit to Stand: 5: Supervision;With upper extremity assist;With armrests;From chair/3-in-1 Stand to Sit: 5: Supervision;With upper extremity assist;With armrests;To chair/3-in-1 Details for Transfer Assistance: supervision for safety today as he is still relying heavily on armrests and pushes back on chair (chiar moves) with his legs to stand.  Ambulation/Gait Ambulation/Gait Assistance: 4: Min guard Ambulation Distance (Feet): 200 Feet Assistive device: Rolling walker Ambulation/Gait Assistance Details: min assist to steady pt for balanc especially while turning and max verbal cues for RW safety (keep feet wider, stay closer to RW, and slow down gait speed).   Gait Pattern: Step-through pattern;Narrow base of support (left foot internally rotated and tends to catch on right) Gait velocity: pt is now going too fast for safety with RW    Exercises Other Exercises Other Exercises: tandem, semi tandem, feet together stands to work on balance bil feet multiple trials.      PT Goals (current goals can now be found in the care plan section) Acute Rehab PT Goals Patient Stated Goal: to go home  Visit Information  Last PT Received On: 11/18/12 Assistance Needed: +1 History of Present Illness: 72 y.o. male admitted to [facility] on 11/11/12 with past medical history of TIA (2011) and [facility] (2011) as well as intermittent episodes of encephalopathy associated with hypertension since 2003 brought to ED with altered mental status and hypertension. In ED: developed emesis - intubated for airway protection. CT revealed no acute findings.  MRI revealed Unusual pattern of restricted diffusion is most notable in the corpus  callosum. 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 The pattern of diffusion and T2 signal abnormality is not suggestive of an ischemic process. Advanced atrophy and chronic microvascular ischemic change.  CXR Some increase in  left basilar airspace disease and small effusion.  Pt extubated 11/13/12.      Subjective Data  Subjective: Wife present for session today reports she used to walk with him a mile several times a week and they would go to the aquatic center and get in the pool and walk and do exercises (she is an [employer]) and she stated that it really seemed to help his balance, but he stopped going because it was too cold in the changing room in the winter months.  I encouraged both of them to continue this after he discharged from rehab.   Patient Stated Goal: to go home   Cognition  Cognition Arousal/Alertness: Awake/alert Behavior During Therapy: WFL for tasks assessed/performed Overall Cognitive Status: History of cognitive impairments - at baseline       End of Session PT - End of Session Equipment Utilized During Treatment: Gait belt Activity Tolerance: Patient tolerated treatment well Patient left: in chair;with call bell/phone within reach     [place] B. Ashely Goosby, PT, DPT [phone]   11/18/2012, 4:48 PM

## 2012-11-19 DIAGNOSIS — B370 Candidal stomatitis: Secondary | ICD-10-CM

## 2012-11-19 LAB — BASIC METABOLIC PANEL
BUN: 15 mg/dL (ref 6–23)
Calcium: 8.8 mg/dL (ref 8.4–10.5)
GFR calc Af Amer: 80 mL/min — ABNORMAL LOW (ref >90)
GFR calc non Af Amer: 69 mL/min — ABNORMAL LOW (ref >90)
Potassium: 3.7 mEq/L (ref 3.5–5.1)
Sodium: 141 mEq/L (ref 135–145)

## 2012-11-19 LAB — CBC
MCH: 33.3 pg (ref 26.0–34.0)
MCHC: 35.2 g/dL (ref 30.0–36.0)
RDW: 13.2 % (ref 11.5–15.5)

## 2012-11-19 MED ORDER — LABETALOL HCL 200 MG PO TABS: 200.0000 mg | ORAL_TABLET | Freq: Two times a day (BID) | ORAL | Status: DC

## 2012-11-19 MED ORDER — FLUCONAZOLE 40 MG/ML PO SUSR: 100.0000 mg | Freq: Every day | ORAL | Status: DC

## 2012-11-19 MED ORDER — CIPROFLOXACIN HCL 500 MG PO TABS: 500.0000 mg | ORAL_TABLET | Freq: Two times a day (BID) | ORAL | Status: AC

## 2012-11-19 MED ORDER — STARCH (THICKENING) PO POWD: ORAL | Status: DC

## 2012-11-19 MED ORDER — ENSURE PUDDING PO PUDG: 1.0000 | Freq: Three times a day (TID) | ORAL | Status: DC

## 2012-11-19 MED ORDER — LEVETIRACETAM 500 MG PO TABS: 500.0000 mg | ORAL_TABLET | Freq: Two times a day (BID) | ORAL | Status: DC

## 2012-11-19 NOTE — Discharge Summary (Signed)
Physician Discharge Summary  Benjamin Park ION:[id number] DOB: [birth year] DOA: 11/11/2012  PCP: Lorenda Peck, MD  Admit date: 11/11/2012 Discharge date: 11/19/2012  Time spent: 40 minutes  Recommendations for Outpatient Follow-up:  1. Follow up with primary MD.   Discharge Diagnoses:  Principal Problem:   Hypertensive encephalopathy Active Problems:   Hx of transient ischemic attack (TIA)   Hyperlipidemia   Sleep apnea   Hypertension   Labile hypertension   Hypertensive urgency   Acute respiratory failure   Hypertensive emergency   Oral thrush   Discharge Condition: Satisfactory.   Diet recommendation: Heart-Healthy/Dysphagia-1/Nectar-Thick Liquids.Ceasar Mons Weights   11/12/12 0500 11/13/12 0530 11/15/12 0421  Weight: 78.5 kg (173 lb 1 oz) 81.2 kg (179 lb 0.2 oz) 78.291 kg (172 lb 9.6 oz)    History of present illness:  72 yo with past medical history of TIA (2011) and SAH (2011) as well as intermittent episodes of encephalopathy associated with hypertension since 2003, s/p CVA, CAD, s/p MI, s/p PCI, PFO, OSA on CPAP, throat cancer, s/p XRT, dysphagia, hypothyroidism, brought to ED with altered mental status and hypertension. In ED: developed emesis, and was intubated for airway protection. CT revealed no acute findings. Seizures were suspected and Keppra given. PCCM was consulted. Transferred to [facility] service, effective 11/15/12.    Hospital Course:  1. Malignant HTN/Acute encephalopathy: Patient has known history of recurrent episodes of hypertensive encephalopathy. He presented with AMS, in the setting of markedly elevated BP of 195/125, consistent with this diagnosis. Managed in the intensive care unit, with aggressive BP control. Patient has now been transitioned to pre-admission oral antihypertensives, adjusted as indicated. Mental status now at baseline, and BP is controlled.  2. 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 Patient was commenced on Keppra, accordingly. Brain MRI revealed an unusual pattern of restricted diffusion is most notable in the corpus callosum, possibly related to epileptiform activity. EEG showed generalized slowing consistent with normal sleep, no epileptiform activity. Stable.  3. Acute respiratory failure: Patient went into acute hypoxic respiratory failure, due to emesis, AMS/inability to protect airway, in the setting of known OSA, and had to be intubated/Ventilated. CXR revealed LLL atelectasis. Successfully extubated on 11/13/12, and is now saturating at 97% on RA. Respiratory status has remained stable since. Patient declined nocturnal CPAP. Patient was initially treated with iv Vancomycin/Zosyn, but these were discontinued on 11/13/12. Culture of tracheal aspirate grew moderate E.Coli and Moderate yeast, consistent with Candida spp. Patient is afebrile, wcc is normal. Placed on a 7-day course of oral Ciprofloxacin, to be concluded on 11/23/12.  4. Dysphagia: Known prior history of dysphagia. Patient was evaluated by SLP with MBS, which revealed severe pharyngeal phase dysphagia, oral phase dysphagia. Recommended D1/Pudding-thick, no liquids initially, but following repeat SLP evaluation on 11/18/12, has been upgraded to Nectar-thick liquids. Tolerated well.  5. Oral Candidiasis: An incidental finding on physical examination. As described in # 3 above, culture of tracheal aspirate, grew moderate Candida spp. On a 14-day course of Diflucan, to be concluded on 11/27/12.  6. Hypothyroidism: On thyroxine replacement therapy.  7. History of CAD: Stable/No clinical evidence of ACS.  8. Deconditioning: Patient is significantly deconditioned, due to acute medical issues.  Rehab consult was requested for possible CIR. Dr Earna Coder  Riley Kill provided consultation, and deemed patient a suitable candidate. However, as insurance has declined, patient will go to SNF, as an alternative option.      Procedures:  See Below.   Consultations: Dr Noel Christmas, neurologist.  Dr Faith Rogue, Rehab MD.    Discharge Exam: Filed Vitals:   11/19/12 0826  BP: 113/73  Pulse: 75  Temp: 97.5 F (36.4 C)  Resp:     General: Comfortable, alert, communicative, oriented, not short of breath at rest.  HEENT: No clinical pallor, no jaundice, no conjunctival injection or discharge. Mild oral thrush.  NECK: Supple, JVP not seen, no carotid bruits, no palpable lymphadenopathy, no palpable goiter.  CHEST: Clinically clear to auscultation, no wheezes, no crackles.  HEART: Sounds 1 and 2 heard, normal, regular, no murmurs.  ABDOMEN: Full, soft, non-tender, no palpable organomegaly, no palpable masses, normal bowel sounds.  GENITALIA: Not examined.  LOWER EXTREMITIES: No pitting edema, palpable peripheral pulses.  MUSCULOSKELETAL SYSTEM: Generalized osteoarthritic changes, otherwise, normal.  CENTRAL NERVOUS SYSTEM: No focal neurologic deficit on gross examination.  Discharge Instructions      Discharge Orders   Future Appointments Provider Department Dept Phone   01/13/2013 10:00 AM Peter M [place], MD [facility] Main Office [place]) [phone]   01/20/2013 3:00 PM Nilda Riggs, NP [facility] [phone]   Future Orders Complete By Expires   Diet - low sodium heart healthy  As directed    Increase activity slowly  As directed        Medication List    STOP taking these medications       chlorthalidone 25 MG tablet  Commonly known as:  HYGROTON     cloNIDine 0.2 MG tablet  Commonly known as:  CATAPRES     topiramate 25 MG tablet  Commonly known as:  TOPAMAX      TAKE these medications       aspirin 325 MG tablet   Take 325 mg by mouth daily.     ciprofloxacin 500 MG tablet  Commonly known as:  CIPRO  Take 1 tablet (500 mg total) by mouth 2 (two) times daily.     feeding supplement Pudg  Take 1 Container by mouth 3 (three) times daily between meals.     fish oil-omega-3 fatty acids 1000 MG capsule  Take 1 g by mouth daily.     fluconazole 40 MG/ML suspension  Commonly known as:  DIFLUCAN  Take 2.5 mLs (100 mg total) by mouth daily.     food thickener Powd  Commonly known as:  THICK IT  As needed.     GLUCOSAMINE PO  Take 1 tablet by mouth daily.     labetalol 200 MG tablet  Commonly known as:  NORMODYNE  Take 1 tablet (200 mg total) by mouth 2 (two) times daily.     levETIRAcetam 500 MG tablet  Commonly known as:  KEPPRA  Take 1 tablet (500 mg total) by mouth 2 (two) times daily.     levothyroxine 100 MCG tablet  Commonly known as:  SYNTHROID, LEVOTHROID  Take 100 mcg by mouth daily.     lovastatin 40 MG tablet  Commonly known as:  MEVACOR  Take 80 mg by mouth at bedtime.     multivitamin with minerals Tabs tablet  Take 1 tablet by mouth daily.     olmesartan 20 MG tablet  Commonly known as:  BENICAR  Take 20 mg by mouth at bedtime.     testosterone  cypionate 200 MG/ML injection  Commonly known as:  DEPOTESTOTERONE CYPIONATE  Inject 200 mg into the muscle every 28 (twenty-eight) days.       No Known Allergies Follow-up Information   Follow up with ROBERTS, Vernie Ammons, MD.   Specialty:  Internal Medicine   Contact information:   [address] [place] [address] [phone]        The results of significant diagnostics from this hospitalization (including imaging, microbiology, ancillary and laboratory) are listed below for reference.    Significant Diagnostic Studies: Ct Head Wo Contrast  11/11/2012   *RADIOLOGY REPORT*  Clinical Data: Severe headache, confusion and slurred speech  CT HEAD WITHOUT CONTRAST  Technique:  Contiguous axial images  were obtained from the base of the skull through the vertex without contrast.  Comparison: Most recent prior head CT 04/09/2012  Findings: No acute intracranial hemorrhage, acute infarction, mass lesion, mass effect, midline shift or hydrocephalus.  Gray-white differentiation is preserved throughout.  Stable appearance of advanced cerebral and cerebellar volume loss and mild - moderate chronic microvascular ischemic white matter changes.  No focal soft tissue abnormality or calvarial abnormality.  Globes and orbits are intact and unremarkable.  Debris layer within the left maxillary sinus.  Mastoid air cells are normally aerated.  IMPRESSION:  1.  No acute intracranial abnormality. 2.  Stable age advanced atrophy and mild chronic ischemic microvascular white matter disease. 3.  Left maxillary paranasal sinus disease.  These results were called by telephone on 11/11/2012 at 4:00 p.m. to Dr. Roseanne Reno, who verbally acknowledged these results.   Original Report Authenticated By: Malachy Moan, M.D.   Mr Brain Wo Contrast  11/12/2012   *RADIOLOGY REPORT*  Clinical Data: Altered mental status.  Hypertension.  MRI HEAD WITHOUT CONTRAST  Technique:  Multiplanar, multiecho pulse sequences of the brain and surrounding structures were obtained according to standard protocol without intravenous contrast.  Comparison: 02/24/2011 MRI most recent.  Findings: An unusual and fairly symmetric pattern of restricted diffusion affects the anterior greater than posterior corpus callosum including the splenium, along with a subcentimeter foci of low-level diffusion restriction involving the bilateral frontal and parietal subcortical white matter.  There is no involvement of the cerebral cortex.  A similar pattern can be seen on previous MR from July and December 2012, and likely  similar involvement of the left splenium in January 2008. This DWI appearance has been described in patients with epilepsy, and this patient has had  multiple similar clinical events dating as far back as 2003.  Advanced atrophy with chronic microvascular ischemic change.  No hemorrhage.  No mass lesion or hydrocephalus.  No extra-axial fluid.  No evidence for posterior circulation vasogenic edema to suggest hypertensive encephalopathy.  Flow voids are maintained in the major intracranial vascular structures.  Heavy mineralization of the dentate nuclei is noted.  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  No acute orbital findings.  Bilateral sinus disease most notable on the left maxillary region.  No mastoid fluid.  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  The pattern of diffusion and T2 signal abnormality is not suggestive of an ischemic process.  Advanced atrophy and chronic microvascular ischemic change.  There are no features suggestive of hypertensive encephalopathy.   Original Report Authenticated By: Davonna Belling, M.D.   Dg Chest [facility]  11/14/2012   *RADIOLOGY REPORT*  Clinical Data: Congestion and atelectasis.  PORTABLE CHEST - 1 VIEW  Comparison: 11/13/2012  Findings: Jugular central line tip is in the lower SVC region.  The endotracheal tube and nasogastric tube have been removed.  There are persistent densities at the left lung base which may represent atelectasis and cannot exclude pleural fluid.  Heart size is grossly stable.  Upper lungs are clear.  IMPRESSION: Persistent densities at the left lung base as described.   Original Report Authenticated By: Richarda Overlie, M.D.   Dg Chest Port 1 View  11/13/2012   *RADIOLOGY REPORT*  Clinical Data: Respiratory failure.  Atelectasis.  PORTABLE CHEST - 1 VIEW  Comparison: Chest 11/11/2012 and 11/12/2012.  Findings: Support tubes and lines are unchanged.  Left basilar airspace disease shows some  worsening.  The right lung remains clear.  No pneumothorax identified.  There is likely a small left effusion.  IMPRESSION:  Some increase in left basilar airspace disease and small effusion. No other change.   Original Report Authenticated By: Holley Dexter, M.D.   Dg Chest Port 1 View  11/12/2012   *RADIOLOGY REPORT*  Clinical Data: Right CVL placement  PORTABLE CHEST - 1 VIEW  Comparison: 11/12/2012 at 0733 hours  Findings: Endotracheal tube terminates 5.5 cm above the carina.  Enteric tube courses below the diaphragm.  Interval placement of a right IJ venous catheter which terminates at the cavoatrial junction.  Mild left basilar opacity, likely reflecting a trace left pleural effusion with associated atelectasis.  No pneumothorax.  The heart is normal in size.  Atrial closure device.  Additional line/leads overlying the heart.  IMPRESSION: Interval placement of a right IJ venous catheter which terminates at the cavoatrial junction.  No pneumothorax.   Original Report Authenticated By: Charline Bills, M.D.   Dg Chest Port 1 View  11/12/2012   *RADIOLOGY REPORT*  Clinical Data: To evaluate ET tube position, lung fields.  PORTABLE CHEST - 1 VIEW  Comparison: 11/11/2012  Findings: Endotracheal tube and NG tube are unchanged.  Heart is upper limits normal in size.  Slight increasing interstitial prominence within the lungs, right greater than left.  Question interstitial edema.  No confluent opacities.  No effusions or acute bony abnormality.  IMPRESSION: Slight increasing interstitial prominence, right slightly greater than left.  Question early interstitial edema.   Original Report Authenticated By: Charlett Nose, M.D.   Dg Chest Port 1 View  11/11/2012   CLINICAL DATA:  Post intubation.  EXAM: PORTABLE CHEST - 1 VIEW  COMPARISON:  04/09/2012  FINDINGS: Endotracheal tube is 4.6 cm above the chronic. NG tube enters the stomach. Lungs are clear. Heart is normal size. No effusions.  IMPRESSION:  Endotracheal tube 4.6 cm above the chronic. No active disease.   Electronically Signed   By: Charlett Nose   On: 11/11/2012 20:25   Dg Swallowing Func-speech Pathology  11/14/2012   Breck Coons Rives, CCC-SLP     11/14/2012 10:31 AM Objective Swallowing Evaluation: Modified Barium Swallowing Study   Patient Details  Name: SOSTENES KAUFFMANN MRN: [id number] Date of Birth: 02-11-1941  Today's Date: 11/14/2012 Time: 2952-8413 SLP Time Calculation (min): 15 min  Past Medical History:  Past Medical History  Diagnosis Date  . Hypertension   . Hx of transient ischemic attack (TIA)   . Hyperlipidemia   . Coronary artery disease   . Throat cancer     radiation therapy  . Hypertensive encephalopathy   . ED (erectile dysfunction)   .  Personal history of kidney stones   . Subarachnoid hemorrhage   . Sleep apnea     on CPAP  . Stroke   . Myocardial infarction     12/1991  . Headache(784.0)   . Dysphagia   . Hypokalemia   . AKI (acute kidney injury) 02/23/2011  . Hypothyroidism   . Unspecified cerebral artery occlusion with cerebral infarction  11/08/2012   Past Surgical History:  Past Surgical History  Procedure Laterality Date  . Angioplasty  1993    LAD  . Cardiac catheterization    . Coronary angioplasty    . Basal cell cancer      head  . Hernia repair    . Appendectomy    . Patent foramen ovale closure  2008  . Eye surgery    . Colonoscopy N/A 06/18/2012    Procedure: COLONOSCOPY;  Surgeon: Petra Kuba, MD;  Location:  [facility] ENDOSCOPY;  Service: Endoscopy;  Laterality: N/A;  h&p in  file-hope   . Hot hemostasis N/A 06/18/2012    Procedure: HOT HEMOSTASIS (ARGON PLASMA COAGULATION/BICAP);   Surgeon: Petra Kuba, MD;  Location: [facility] ENDOSCOPY;  Service:  Endoscopy;  Laterality: N/A;   HPI:  72 yo with past medical history of TIA (2011) and SAH (2011) as  well as intermittent episodes of encephalopathy associated with  hypertension since 2003 brought to ED with altered mental status  and hypertension. In ED: developed emesis - intubated  for airway  protection. CT revealed no acute findings.  MRI revealed Unusual  pattern of restricted diffusion is most notable in the corpus  callosum. 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 The  pattern of diffusion and T2 signal abnormality is not suggestive  of an ischemic process. Advanced atrophy and chronic  microvascular ischemic change.  CXR Some increase in left basilar  airspace disease and small effusion. No other change.  Wife  reports pt. did not follow recommendations of honey thick liquid  once discharged in 2012 and that he coughs "almost every time he  drinks".  He has not had pna in the past two years.  MBS  recommended.     Assessment / Plan / Recommendation Clinical Impression  Dysphagia Diagnosis: Severe pharyngeal phase dysphagia;Mild oral  phase dysphagia Clinical impression: Mild-severe oral and pharyngeal dysphagia  based on impairments of motor tract (mild sensory involvement)  with history of chronic dysphagia exacerbated by 3-4 day  intubation.   Poor tongue base retraction and laryngeal elevation  led to copious vallecular residue/mild pyriform sinus residue  resulting in frank aspiration with tsp honey thick barium  (delayed cough).  Therapeutic intervention included chin tuck  posture which increased aspiration.  Prognosis for improvement  for return to baseline is good as edema and irritation of  pharynx/larynx decreases.  Recommend a Dys 1 diet (difficulty  masticating and premature spill while masticating) and NO liquids  (pudding thick drinks).  SLP will follow.      Treatment Recommendation  Therapy as outlined in treatment plan below    Diet Recommendation Dysphagia 1 (Puree);No liquids (pudding thick  drinks)   Liquid Administration via: Spoon Medication Administration: Crushed with puree Supervision: Patient able to self feed;Intermittent supervision  to cue for compensatory strategies Compensations: Slow rate;Small sips/bites;Multiple dry swallows  after each  bite/sip;Clear throat intermittently Postural Changes and/or Swallow Maneuvers: Seated upright 90  degrees    Other  Recommendations Oral Care Recommendations: Oral care BID   Follow Up Recommendations   (TBD)  Frequency and Duration min 2x/week  2 weeks   Pertinent Vitals/Pain none    SLP Swallow Goals Patient will utilize recommended strategies during swallow to  increase swallowing safety with: Minimal cueing      Reason for Referral Objectively evaluate swallowing function   Oral Phase Oral Preparation/Oral Phase Oral Phase: Impaired Oral - Solids Oral - Regular: Weak lingual manipulation;Delayed oral transit   Pharyngeal Phase Pharyngeal Phase Pharyngeal Phase: Impaired Pharyngeal - Honey Pharyngeal - Honey Teaspoon: Penetration/Aspiration during  swallow;Penetration/Aspiration after swallow;Reduced tongue base  retraction;Reduced airway/laryngeal closure;Reduced laryngeal  elevation;Reduced anterior laryngeal mobility;Pharyngeal residue  - valleculae;Pharyngeal residue - pyriform sinuses Penetration/Aspiration details (honey teaspoon): Material enters  airway, passes BELOW cords and not ejected out despite cough  attempt by patient (delayed cough) Pharyngeal - Solids Pharyngeal - Puree: Reduced anterior laryngeal mobility;Reduced  laryngeal elevation;Reduced tongue base retraction;Pharyngeal  residue - valleculae;Pharyngeal residue - pyriform sinuses  Cervical Esophageal Phase    GO    Cervical Esophageal Phase Cervical Esophageal Phase: [facility]         Darrow Bussing.Ed CCC-SLP Pager [phone]  11/14/2012    Microbiology: Recent Results (from the past 240 hour(s))  MRSA PCR SCREENING     Status: None   Collection Time    11/11/12  9:27 PM      Result Value Range Status   MRSA by PCR NEGATIVE  NEGATIVE Final   Comment:            The GeneXpert MRSA Assay (FDA     approved for NASAL specimens     only), is one component of a     comprehensive MRSA colonization     surveillance program. It is  not     intended to diagnose MRSA     infection nor to guide or     monitor treatment for     MRSA infections.  URINE CULTURE     Status: None   Collection Time    11/11/12  9:27 PM      Result Value Range Status   Specimen Description URINE, CATHETERIZED   Final   Special Requests ADDED [id number]   Final   Culture  Setup Time     Final   Value: 11/12/2012 13:02     Performed at [facility]   Colony Count     Final   Value: NO GROWTH     Performed at [facility]   Culture     Final   Value: NO GROWTH     Performed at [facility]   Report Status 11/13/2012 FINAL   Final  CULTURE, BLOOD (ROUTINE X 2)     Status: None   Collection Time    11/12/12  6:30 AM      Result Value Range Status   Specimen Description BLOOD RIGHT ANTECUBITAL   Final   Special Requests BOTTLES DRAWN AEROBIC AND ANAEROBIC   Final   Culture  Setup Time     Final   Value: 11/12/2012 11:21     Performed at [facility]   Culture     Final   Value: NO GROWTH 5 DAYS     Performed at [facility]   Report Status 11/18/2012 FINAL   Final  CULTURE, BLOOD (ROUTINE X 2)     Status: None   Collection Time    11/12/12  6:50 AM      Result Value Range Status   Specimen  Description BLOOD LEFT HAND   Final   Special Requests BOTTLES DRAWN AEROBIC ONLY 1CC   Final   Culture  Setup Time     Final   Value: 11/12/2012 11:21     Performed at [facility]   Culture     Final   Value: NO GROWTH 5 DAYS     Performed at [facility]   Report Status 11/18/2012 FINAL   Final  CULTURE, RESPIRATORY (NON-EXPECTORATED)     Status: None   Collection Time    11/12/12  7:51 PM      Result Value Range Status   Specimen Description TRACHEAL ASPIRATE   Final   Special Requests NONE   Final   Gram Stain     Final   Value: MODERATE WBC PRESENT,BOTH PMN AND MONONUCLEAR     NO SQUAMOUS EPITHELIAL CELLS SEEN     RARE GRAM NEGATIVE RODS     RARE YEAST     Performed  at [facility]   Culture     Final   Value: MODERATE ESCHERICHIA COLI     MODERATE YEAST CONSISTENT WITH CANDIDA SPECIES     Performed at [facility]   Report Status 11/16/2012 FINAL   Final   Organism ID, Bacteria ESCHERICHIA COLI   Final     Labs: Basic Metabolic Panel:  Recent Labs Lab 11/15/12 0559 11/16/12 0550 11/17/12 0545 11/18/12 0455 11/19/12 0500  NA 143 145 140 143 141  K 3.2* 3.3* 3.3* 3.8 3.7  CL 109 111 107 109 108  CO2 25 26 26 26 27   GLUCOSE 106* 105* 102* 101* 91  BUN 15 21 19 18 15   CREATININE 1.05 1.00 0.95 1.00 1.05  CALCIUM 8.8 8.5 8.6 8.6 8.8   Liver Function Tests: No results found for this basename: AST, ALT, ALKPHOS, BILITOT, PROT, ALBUMIN,  in the last 168 hours No results found for this basename: LIPASE, AMYLASE,  in the last 168 hours No results found for this basename: AMMONIA,  in the last 168 hours CBC:  Recent Labs Lab 11/14/12 0500 11/16/12 0550 11/17/12 0545 11/18/12 0455 11/19/12 0500  WBC 8.7 6.3 6.6 6.9 6.4  HGB 13.8 12.6* 12.5* 12.2* 12.5*  HCT 37.9* 35.8* 35.8* 35.5* 35.5*  MCV 93.3 94.2 94.2 95.2 94.7  PLT 122* 147* 152 149* 167   Cardiac Enzymes: No results found for this basename: CKTOTAL, CKMB, CKMBINDEX, TROPONINI,  in the last 168 hours BNP: BNP (last 3 results) No results found for this basename: PROBNP,  in the last 8760 hours CBG:  Recent Labs Lab 11/13/12 1523 11/13/12 1914 11/13/12 2327 11/14/12 0328 11/14/12 0739  GLUCAP 88 78 93 84 105*       Signed:  Ineze Serrao,CHRISTOPHER  [facility] 11/19/2012, 10:22 AM

## 2012-11-19 NOTE — Progress Notes (Signed)
Occupational Therapy Treatment Patient Details Name: Benjamin Park MRN: [id number] DOB: 06/29/1940 Today's Date: 11/19/2012 Time: 1308-6578 OT Time Calculation (min): 31 min  OT Assessment / Plan / Recommendation  History of present illness 72 y.o. male admitted to [facility] on 11/11/12 with past medical history of TIA (2011) and SAH (2011) as well as intermittent episodes of encephalopathy associated with hypertension since 2003 brought to ED with altered mental status and hypertension. In ED: developed emesis - intubated for airway protection. CT revealed no acute findings.  MRI revealed Unusual pattern of restricted diffusion is most notable in the corpus callosum. 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 The pattern of diffusion and T2 signal abnormality is not suggestive of an ischemic process. Advanced atrophy and chronic microvascular ischemic change.  CXR Some increase in left basilar airspace disease and small effusion.  Pt extubated 11/13/12.     OT comments  Pt completed bathing, shaving and dressing at sink level. Pt demonstrates balance deficits with activity. Pt with posterior lean with buttoning shirt. Pt with min (A) to correct. OT asked "what happen? Did you get dizzy?" Pt states "what that, I do that a lot"  Follow Up Recommendations  SNF    Barriers to Discharge       Equipment Recommendations  Tub/shower bench    Recommendations for Other Services    Frequency Min 2X/week   Progress towards OT Goals Progress towards OT goals: Progressing toward goals  Plan Discharge plan remains appropriate    Precautions / Restrictions Precautions Precautions: Fall Precaution Comments: wife reports very frequent falls at home, sometimes twice a day   Pertinent Vitals/Pain None reported    ADL  Grooming: Wash/dry hands;Wash/dry face;Teeth care;Shaving;Supervision/safety Where Assessed - Grooming: Unsupported standing Upper Body Bathing: Chest;Right arm;Left arm;Abdomen;Min  guard Where Assessed - Upper Body Bathing: Unsupported sit to stand Lower Body Bathing: Min guard Where Assessed - Lower Body Bathing: Unsupported sit to stand Upper Body Dressing: Supervision/safety Where Assessed - Upper Body Dressing: Unsupported sitting Lower Body Dressing: Min guard Where Assessed - Lower Body Dressing: Unsupported sit to stand Toilet Transfer: Supervision/safety Toilet Transfer Method: Sit to Barista: Raised toilet seat with arms (or 3-in-1 over toilet) Equipment Used: Rolling walker Transfers/Ambulation Related to ADLs: pt needed cues for rw safety. pt abandoned rw several times during session ADL Comments: Pt completed full Adl at sink level with wife present. Pt reports incorrect location of barber shop. Pt s wife states "i am surprised by that" Pt with poor long term history. Pt required cues for safety with LB. pt attempting to static stand for LB dressing . pt educated on sitting for LB dressing.     OT Diagnosis:    OT Problem List:   OT Treatment Interventions:     OT Goals(current goals can now be found in the care plan section) Acute Rehab OT Goals Patient Stated Goal: to go home OT Goal Formulation: With patient Time For Goal Achievement: 11/29/12 Potential to Achieve Goals: Good ADL Goals Pt Will Perform Grooming: with modified independence;standing Pt Will Perform Upper Body Bathing: with modified independence;sitting Pt Will Perform Lower Body Bathing: with modified independence;sit to/from stand Pt Will Perform Upper Body Dressing: with modified independence;sitting Pt Will Perform Lower Body Dressing: with modified independence;sit to/from stand Pt Will Transfer to Toilet: with modified independence;ambulating;regular height toilet Pt Will Perform Toileting - Clothing Manipulation and hygiene: with modified independence;sit to/from stand Pt Will Perform Tub/Shower Transfer: Tub transfer;with modified independence;tub  bench;rolling walker;with transfer board  Visit Information  Last OT Received On: 11/19/12 Assistance Needed: +1 History of Present Illness: 72 y.o. male admitted to [facility] on 11/11/12 with past medical history of TIA (2011) and SAH (2011) as well as intermittent episodes of encephalopathy associated with hypertension since 2003 brought to ED with altered mental status and hypertension. In ED: developed emesis - intubated for airway protection. CT revealed no acute findings.  MRI revealed Unusual pattern of restricted diffusion is most notable in the corpus callosum. 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 The pattern of diffusion and T2 signal abnormality is not suggestive of an ischemic process. Advanced atrophy and chronic microvascular ischemic change.  CXR Some increase in left basilar airspace disease and small effusion.  Pt extubated 11/13/12.      Subjective Data      Prior Functioning       Cognition  Cognition Arousal/Alertness: Awake/alert Behavior During Therapy: WFL for tasks assessed/performed Overall Cognitive Status: History of cognitive impairments - at baseline    Mobility  Bed Mobility Bed Mobility: Supine to Sit;Sitting - Scoot to Edge of Bed Supine to Sit: 6: Modified independent (Device/Increase time) Sitting - Scoot to Edge of Bed: 6: Modified independent (Device/Increase time) Transfers Sit to Stand: 5: Supervision Stand to Sit: 5: Supervision Details for Transfer Assistance: supervision and v/c for safety with RW    Exercises      Balance     End of Session OT - End of Session Activity Tolerance: Patient tolerated treatment well Patient left: in chair;with call bell/phone within reach;with family/visitor present Nurse Communication: Mobility status;Precautions  GO     Benjamin Park, Benjamin Park 11/19/2012, 3:07 PM Pager: [phone]

## 2012-11-19 NOTE — Progress Notes (Signed)
CSW Proofreader) spoke with pt and pt wife. Pt will be discharging to [facility]. Facility has been notified and pt has an available bed. Pt wife to provide transportation. CSW placed pt dc packet with shadow chart and notified RN. CSW signing off.  Samar Venneman, LCSWA [phone]

## 2012-11-19 NOTE — Progress Notes (Signed)
Pt discharged to [facility] per MD order. Pt and wife received and reviewed all discharge instructions and medication information including follow-up appointments and prescription information.  Pt and wife also reviewed stroke education prior to discharge. Pt and wife verbalized understanding.  Pt alert and oriented at discharge with no complaints of pain. Pt escorted to private vehicle via wheelchair by guest services volunteer.  All personal belongings sent with pt. Attempted to call report to receiving RN at [facility]. Joylene Grapes

## 2012-11-19 NOTE — Progress Notes (Signed)
Speech Language Pathology Dysphagia Treatment Patient Details Name: Benjamin Park MRN: [id number] DOB: 03/23/1940 Today's Date: 11/19/2012 Time: 4098-1191 SLP Time Calculation (min): 8 min  Assessment / Plan / Recommendation Clinical Impression  Pt. seen for dysphagia treatment after diet/liquids upgraded yesterday.  Pt. stated he received thin liquids on tray this morning after asked by this SLP.  There is no option for thickened liquids with a regular consistency diet texture, reason for receiving thin liquids (SLP corrected in orders).  Dys 3 is similar to regular and only restricts salads and raw vegetables.  He consumed nectar juice with suspected delayed swallow initiation without evidence of aspiration and required min verbal reminders for intermittent throat clear strategy.  Pt. states he may be leaving for rehab at SNF soon.  Recommend he continue Dys 3 diet and nectar thick liquids.  He has a history of non-compliance with thickener, however recommend he have a repeat MBS as outpatient to determine ability to upgrade to thin.    Diet Recommendation  Initiate / Change Diet: Dysphagia 3 (mechanical soft);Nectar-thick liquid    SLP Plan Continue with current plan of care   Pertinent Vitals/Pain none   Swallowing Goals  SLP Swallowing Goals Patient will consume recommended diet without observed clinical signs of aspiration with: Moderate assistance Swallow Study Goal #1 - Progress: Progressing toward goal Patient will utilize recommended strategies during swallow to increase swallowing safety with: Moderate assistance Swallow Study Goal #2 - Progress: Progressing toward goal  General Temperature Spikes Noted: No Respiratory Status: Room air Behavior/Cognition: Alert;Cooperative;Pleasant mood Oral Cavity - Dentition:  (doesn't have denture cream, not wearing dentures) Patient Positioning: Upright in bed  Oral Cavity - Oral Hygiene Does patient have any of the following "at risk"  factors?: Saliva - thick, dry mouth;Diet - patient on thickened liquids;Other - dysphagia Brush patient's teeth BID with toothbrush (using toothpaste with fluoride): Yes Patient is HIGH RISK - Oral Care Protocol followed (see row info): Yes   Dysphagia Treatment Treatment focused on: Skilled observation of diet tolerance;Patient/family/caregiver education;Utilization of compensatory strategies Treatment Methods/Modalities: Skilled observation Patient observed directly with PO's: Yes Type of PO's observed: Nectar-thick liquids Feeding: Able to feed self Liquids provided via: Cup;No straw Pharyngeal Phase Signs & Symptoms: Suspected delayed swallow initiation Type of cueing: Verbal Amount of cueing: Minimal   GO     Royce Macadamia M.Ed [employer] [phone]  11/19/2012

## 2012-11-20 ENCOUNTER — Non-Acute Institutional Stay (SKILLED_NURSING_FACILITY): Payer: Medicare Other | Admitting: Internal Medicine

## 2012-11-20 ENCOUNTER — Other Ambulatory Visit: Payer: Self-pay

## 2012-11-20 ENCOUNTER — Encounter: Payer: Self-pay | Admitting: Internal Medicine

## 2012-11-20 DIAGNOSIS — G40909 Epilepsy, unspecified, not intractable, without status epilepticus: Secondary | ICD-10-CM

## 2012-11-20 DIAGNOSIS — N529 Male erectile dysfunction, unspecified: Secondary | ICD-10-CM

## 2012-11-20 DIAGNOSIS — I10 Essential (primary) hypertension: Secondary | ICD-10-CM

## 2012-11-20 DIAGNOSIS — E785 Hyperlipidemia, unspecified: Secondary | ICD-10-CM

## 2012-11-20 DIAGNOSIS — J9600 Acute respiratory failure, unspecified whether with hypoxia or hypercapnia: Secondary | ICD-10-CM

## 2012-11-20 DIAGNOSIS — R0989 Other specified symptoms and signs involving the circulatory and respiratory systems: Secondary | ICD-10-CM

## 2012-11-20 DIAGNOSIS — B370 Candidal stomatitis: Secondary | ICD-10-CM

## 2012-11-20 DIAGNOSIS — I2510 Atherosclerotic heart disease of native coronary artery without angina pectoris: Secondary | ICD-10-CM

## 2012-11-20 DIAGNOSIS — Z8673 Personal history of transient ischemic attack (TIA), and cerebral infarction without residual deficits: Secondary | ICD-10-CM

## 2012-11-20 MED ORDER — TESTOSTERONE CYPIONATE 200 MG/ML IM SOLN: 200.0000 mg | INTRAMUSCULAR | Status: DC

## 2012-11-20 NOTE — Assessment & Plan Note (Signed)
Will continue mevacor

## 2012-11-20 NOTE — Progress Notes (Signed)
MRN: [id number] Name: Benjamin Park  Sex: male Age: 72 y.o. DOB: [birth year]  PSC #: Sonny Dandy Facility/Room:  116 Level Of Care: SNF Provider: Merrilee Seashore D Emergency Contacts: Extended Emergency Contact Information Primary Emergency Contact: Barfoot,Barbara Address: [address]          [place], [place] [address] [place] Home Phone: [phone] Mobile Phone: [phone] Relation: Spouse  Code Status: FULL  Allergies: Review of patient's allergies indicates no known allergies.  Chief Complaint  Patient presents with  . nursing home admission    HPI: Patient is 72 y.o. male who is admitted for PT/OT/ST s/p HTN emergency with encephalopathy, now resolved.  Past Medical History  Diagnosis Date  . Hypertension   . Hx of transient ischemic attack (TIA)   . Hyperlipidemia   . Coronary artery disease   . Throat cancer     radiation therapy  . Hypertensive encephalopathy   . ED (erectile dysfunction)   . Personal history of kidney stones   . Subarachnoid hemorrhage   . Sleep apnea     on CPAP  . Myocardial infarction     12/1991  . Headache(784.0)   . Dysphagia   . Hypokalemia   . AKI (acute kidney injury) 02/23/2011  . Hypothyroidism   . Stroke   . Unspecified cerebral artery occlusion with cerebral infarction 11/08/2012    Past Surgical History  Procedure Laterality Date  . Angioplasty  1993    LAD  . Cardiac catheterization    . Coronary angioplasty    . Basal cell cancer      head  . Hernia repair    . Appendectomy    . Patent foramen ovale closure  2008  . Eye surgery    . Colonoscopy N/A 06/18/2012    Procedure: COLONOSCOPY;  Surgeon: Petra Kuba, MD;  Location: [facility] ENDOSCOPY;  Service: Endoscopy;  Laterality: N/A;  h&p in file-hope   . Hot hemostasis N/A 06/18/2012    Procedure: HOT HEMOSTASIS (ARGON PLASMA COAGULATION/BICAP);  Surgeon: Petra Kuba, MD;  Location: [facility] ENDOSCOPY;  Service: Endoscopy;  Laterality: N/A;      Medication  List       This list is accurate as of: 11/20/12 12:13 PM.  Always use your most recent med list.               aspirin 325 MG tablet  Take 325 mg by mouth daily.     ciprofloxacin 500 MG tablet  Commonly known as:  CIPRO  Take 1 tablet (500 mg total) by mouth 2 (two) times daily.     feeding supplement Pudg  Take 1 Container by mouth 3 (three) times daily between meals.     fish oil-omega-3 fatty acids 1000 MG capsule  Take 1 g by mouth daily.     fluconazole 40 MG/ML suspension  Commonly known as:  DIFLUCAN  Take 2.5 mLs (100 mg total) by mouth daily.     GLUCOSAMINE PO  Take 1 tablet by mouth daily.     labetalol 200 MG tablet  Commonly known as:  NORMODYNE  Take 1 tablet (200 mg total) by mouth 2 (two) times daily.     levETIRAcetam 500 MG tablet  Commonly known as:  KEPPRA  Take 1 tablet (500 mg total) by mouth 2 (two) times daily.     levothyroxine 100 MCG tablet  Commonly known as:  SYNTHROID, LEVOTHROID  Take 100 mcg by mouth daily.  lovastatin 40 MG tablet  Commonly known as:  MEVACOR  Take 80 mg by mouth at bedtime.     multivitamin with minerals Tabs tablet  Take 1 tablet by mouth daily.     olmesartan 20 MG tablet  Commonly known as:  BENICAR  Take 20 mg by mouth at bedtime.     testosterone cypionate 200 MG/ML injection  Commonly known as:  DEPOTESTOTERONE CYPIONATE  Inject 1 mL (200 mg total) into the muscle every 28 (twenty-eight) days.        No orders of the defined types were placed in this encounter.    There is no immunization history for the selected administration types on file for this patient.  History  Substance Use Topics  . Smoking status: Former Smoker    Types: Cigarettes    Quit date: 09/21/1990  . Smokeless tobacco: Never Used  . 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 GENERAL: Feels well no fevers, fatigue, appetite changes SKIN: No itching, rash or wounds EYES: No eye pain, redness, discharge EARS: No earache, tinnitus, change in hearing NOSE: No congestion, drainage or bleeding  MOUTH/THROAT: mouth pain much improved RESPIRATORY: No cough, wheezing, SOB CARDIAC: No chest pain, palpitations, lower extremity edema  GI: No abdominal pain, No N/V/D or constipation, No heartburn or reflux  GU: No dysuria, frequency or urgency, or incontinence  MUSCULOSKELETAL: No unrelieved bone/joint pain NEUROLOGIC: Awake, alert, appropriate to situation, No change in mental status. Moves all four, no focal deficits PSYCHIATRIC: No overt anxiety or sadness. Sleeps well. No behavior issue.     Filed Vitals:   11/20/12 1059  BP: 116/70  Pulse: 75  Temp: 97.1 F (36.2 C)  Resp: 20    Physical Exam  GENERAL APPEARANCE: Alert, conversant. Appropriately groomed. No acute distress.  SKIN: No diaphoresis rash, or wounds HEAD: Normocephalic, atraumatic  EYES: Conjunctiva/lids clear. Pupils round, reactive. EOMs intact.  EARS: External exam WNL, canals clear. Hearing grossly normal.  NOSE: No deformity or discharge.  MOUTH/THROAT: Lips w/o lesions. Mouth and throat normal.  RESPIRATORY: Breathing is even, unlabored. Lung sounds are clear   CARDIOVASCULAR: Heart RRR no murmurs, rubs or gallops. No peripheral edema.  ARTERIAL: radial pulse 2+, DP pulse 1+  VENOUS: No varicosities. No venous stasis skin changes  GASTROINTESTINAL: Abdomen is soft, non-tender, not distended w/ normal bowel sounds. GENITOURINARY: Bladder non tender, not distended  MUSCULOSKELETAL: No abnormal joints or musculature NEUROLOGIC: Oriented X3. Cranial nerves 2-12 grossly intact. Moves all extremities no tremor. PSYCHIATRIC: Mood and affect  appropriate to situation, no behavioral issues  Patient Active Problem List   Diagnosis Date Noted  . Epilepsy without status epilepticus, not intractable 11/20/2012  . Oral thrush 11/16/2012  . Labile hypertension 11/11/2012  . Hypertensive urgency 11/11/2012  . Acute respiratory failure 11/11/2012  . Hypertensive emergency 11/11/2012  . Abnormal involuntary movements(781.0) 11/08/2012  . Unspecified cerebral artery occlusion with cerebral infarction 11/08/2012  . Chronic pulmonary aspiration 07/15/2012  . Syncope and collapse 03/19/2011  . Mild dehydration 03/19/2011  . AKI (acute kidney injury) 02/23/2011  . S/P patent foramen ovale closure 09/27/2010  . Hypertension   . Subarachnoid hemorrhage   .  Coronary artery disease   . Sleep apnea   . Hx of transient ischemic attack (TIA)   . Hyperlipidemia   . Throat cancer   . Hypertensive encephalopathy   . ED (erectile dysfunction)   . Personal history of kidney stones     CBC    Component Value Date/Time   WBC 6.4 11/19/2012 0500   RBC 3.75* 11/19/2012 0500   HGB 12.5* 11/19/2012 0500   HCT 35.5* 11/19/2012 0500   PLT 167 11/19/2012 0500   MCV 94.7 11/19/2012 0500   LYMPHSABS 0.7 11/12/2012 0500   MONOABS 1.3* 11/12/2012 0500   EOSABS 0.0 11/12/2012 0500   BASOSABS 0.0 11/12/2012 0500    CMP     Component Value Date/Time   NA 141 11/19/2012 0500   K 3.7 11/19/2012 0500   CL 108 11/19/2012 0500   CO2 27 11/19/2012 0500   GLUCOSE 91 11/19/2012 0500   BUN 15 11/19/2012 0500   CREATININE 1.05 11/19/2012 0500   CALCIUM 8.8 11/19/2012 0500   PROT 7.3 11/11/2012 1546   ALBUMIN 3.9 11/11/2012 1546   AST 18 11/11/2012 1546   ALT 15 11/11/2012 1546   ALKPHOS 68 11/11/2012 1546   BILITOT 0.6 11/11/2012 1546   GFRNONAA 69* 11/19/2012 0500   GFRAA 80* 11/19/2012 0500    Assessment and Plan  Labile hypertension Pt has had numerous episode of BP spiking suddenly. While pt was in hospital he had a recurrent spike.pt's BP meds were changed. We will continue  the labetolol, now increased to 200 mg BID and new drug, benicar 40 mg daily; pt has appt with [facility] 01/13/2013 for follow up.  Hx of transient ischemic attack (TIA) It was felt pt had a CVA during this HTN episode.Balance being a little worse than usual per wife only deficit and it is getting better.  Epilepsy without status epilepticus, not intractable Pt was started on Keppra BID and we will continue it. I have spoken to patient the importance of not driving.pt has follow up with [facility] 01/20/2013 at 3:00 pm  Oral thrush On diflucan for same. Discussed with his wife, she thinks he has been on it for 4 days. I told her we would just keep him on it until 9/14 just ot be sure. He needs to ne on it for longer than 14 days.  Acute respiratory failure Was on a vent at hospital for same; felt sec to emesis/h/o dysphagia; after IV antibiotics pt was started on Cipro to continue to 9/6  Coronary artery disease Stable - no problems this hospitalization  Hyperlipidemia Will continue mevacor  ED (erectile dysfunction) Pt has known low testosterone-gets a shot monthly    Lyn Hollingshead, Randon Goldsmith, MD

## 2012-11-20 NOTE — Assessment & Plan Note (Addendum)
Pt was started on Keppra BID and we will continue it. I have spoken to patient the importance of not driving.pt has follow up with [facility] 01/20/2013 at 3:00 pm

## 2012-11-20 NOTE — Telephone Encounter (Signed)
Verified instruction the same as on manually fax received

## 2012-11-20 NOTE — Assessment & Plan Note (Signed)
Was on a vent at hospital for same; felt sec to emesis/h/o dysphagia; after IV antibiotics pt was started on Cipro to continue to 9/6

## 2012-11-20 NOTE — Assessment & Plan Note (Signed)
Stable - no problems this hospitalization

## 2012-11-20 NOTE — Assessment & Plan Note (Addendum)
Pt has had numerous episode of BP spiking suddenly. While pt was in hospital he had a recurrent spike.pt's BP meds were changed. We will continue the labetolol, now increased to 200 mg BID and new drug, benicar 40 mg daily; pt has appt with [facility] 01/13/2013 for follow up.

## 2012-11-20 NOTE — Assessment & Plan Note (Signed)
On diflucan for same. Discussed with his wife, she thinks he has been on it for 4 days. I told her we would just keep him on it until 9/14 just ot be sure. He needs to ne on it for longer than 14 days.

## 2012-11-20 NOTE — Progress Notes (Signed)
MRN: [id number] Name: Benjamin Park  Sex: male Age: 72 y.o. DOB: Jun 22, 1940  PSC #: Sonny Dandy Facility/Room: 116 Level Of Care: SNF Provider: Merrilee Seashore D Emergency Contacts: Extended Emergency Contact Information Primary Emergency Contact: Sheeley,Barbara Address: [address]          [place], [place] [address] [place] Home Phone: [phone] Mobile Phone: [phone] Relation: Spouse  Code Status: FULL  Allergies: Review of patient's allergies indicates no known allergies.  Chief Complaint  Patient presents with  . Discharge Note    HPI: Patient is 72 y.o. male who was admitted yesterday for PT/OIT/ST who feels, along with his wife, that he can do therapy at home. Pt 's wife is a Patent examiner and had has PT at home for him before so she is very knowledgable.  Past Medical History  Diagnosis Date  . Hypertension   . Hx of transient ischemic attack (TIA)   . Hyperlipidemia   . Coronary artery disease   . Throat cancer     radiation therapy  . Hypertensive encephalopathy   . ED (erectile dysfunction)   . Personal history of kidney stones   . Subarachnoid hemorrhage   . Sleep apnea     on CPAP  . Myocardial infarction     12/1991  . Headache(784.0)   . Dysphagia   . Hypokalemia   . AKI (acute kidney injury) 02/23/2011  . Hypothyroidism   . Stroke   . Unspecified cerebral artery occlusion with cerebral infarction 11/08/2012    Past Surgical History  Procedure Laterality Date  . Angioplasty  1993    LAD  . Cardiac catheterization    . Coronary angioplasty    . Basal cell cancer      head  . Hernia repair    . Appendectomy    . Patent foramen ovale closure  2008  . Eye surgery    . Colonoscopy N/A 06/18/2012    Procedure: COLONOSCOPY;  Surgeon: Petra Kuba, MD;  Location: [facility] ENDOSCOPY;  Service: Endoscopy;  Laterality: N/A;  h&p in file-[facility]   . Hot hemostasis N/A 06/18/2012    Procedure: HOT HEMOSTASIS (ARGON PLASMA  COAGULATION/BICAP);  Surgeon: Petra Kuba, MD;  Location: [facility] ENDOSCOPY;  Service: Endoscopy;  Laterality: N/A;      Medication List       This list is accurate as of: 11/20/12 12:25 PM.  Always use your most recent med list.               aspirin 325 MG tablet  Take 325 mg by mouth daily.     ciprofloxacin 500 MG tablet  Commonly known as:  CIPRO  Take 1 tablet (500 mg total) by mouth 2 (two) times daily.     feeding supplement Pudg  Take 1 Container by mouth 3 (three) times daily between meals.     fish oil-omega-3 fatty acids 1000 MG capsule  Take 1 g by mouth daily.     fluconazole 40 MG/ML suspension  Commonly known as:  DIFLUCAN  Take 100 mg by mouth daily.     GLUCOSAMINE PO  Take 1 tablet by mouth daily.     labetalol 200 MG tablet  Commonly known as:  NORMODYNE  Take 1 tablet (200 mg total) by mouth 2 (two) times daily.     levETIRAcetam 500 MG tablet  Commonly known as:  KEPPRA  Take 1 tablet (500 mg total) by mouth 2 (two) times daily.  levothyroxine 100 MCG tablet  Commonly known as:  SYNTHROID, LEVOTHROID  Take 100 mcg by mouth daily.     lovastatin 40 MG tablet  Commonly known as:  MEVACOR  Take 80 mg by mouth at bedtime.     multivitamin with minerals Tabs tablet  Take 1 tablet by mouth daily.     olmesartan 20 MG tablet  Commonly known as:  BENICAR  Take 20 mg by mouth at bedtime.     testosterone cypionate 200 MG/ML injection  Commonly known as:  DEPOTESTOTERONE CYPIONATE  Inject 1 mL (200 mg total) into the muscle every 28 (twenty-eight) days.        Meds ordered this encounter  Medications  . fluconazole (DIFLUCAN) 40 MG/ML suspension    Sig: Take 100 mg by mouth daily.    There is no immunization history for the selected administration types on file for this patient.  History  Substance Use Topics  . Smoking status: Former Smoker    Types: Cigarettes    Quit date: 09/21/1990  . Smokeless tobacco: Never Used  . Alcohol  Use: No    Family history is noncontributory    Review of Systems  DATA OBTAINED: from patient and wife - no c/o;ready to go  Filed Vitals:   11/20/12 1222  BP: 116/70  Pulse: 75  Temp: 97.1 F (36.2 C)  Resp: 20    Physical Exam  GENERAL APPEARANCE: Alert, conversant. Appropriately groomed. No acute distress.  SKIN: No diaphoresis rash, or wounds HENT- unremarkable RESPIRATORY: Breathing is even, unlabored. Lung sounds are clear   CARDIOVASCULAR: Heart RRR no murmurs, rubs or gallops. No peripheral edema.   GASTROINTESTINAL: Abdomen is soft, non-tender, not distended w/ normal bowel sounds MUSCULOSKELETAL: No abnormal joints or musculature NEUROLOGIC: Oriented X3. Cranial nerves 2-12 grossly intact. Moves all extremities no tremor. PSYCHIATRIC: Mood and affect appropriate to situation, no behavioral issues  Patient Active Problem List   Diagnosis Date Noted  . Epilepsy without status epilepticus, not intractable 11/20/2012  . Oral thrush 11/16/2012  . Labile hypertension 11/11/2012  . Hypertensive urgency 11/11/2012  . Acute respiratory failure 11/11/2012  . Hypertensive emergency 11/11/2012  . Abnormal involuntary movements(781.0) 11/08/2012  . Unspecified cerebral artery occlusion with cerebral infarction 11/08/2012  . Chronic pulmonary aspiration 07/15/2012  . Syncope and collapse 03/19/2011  . Mild dehydration 03/19/2011  . AKI (acute kidney injury) 02/23/2011  . S/P patent foramen ovale closure 09/27/2010  . Hypertension   . Subarachnoid hemorrhage   . Coronary artery disease   . Sleep apnea   . Hx of transient ischemic attack (TIA)   . Hyperlipidemia   . Throat cancer   . Hypertensive encephalopathy   . ED (erectile dysfunction)   . Personal history of kidney stones      CBC    Component Value Date/Time   WBC 6.4 11/19/2012 0500   RBC 3.75* 11/19/2012 0500   HGB 12.5* 11/19/2012 0500   HCT 35.5* 11/19/2012 0500   PLT 167 11/19/2012 0500   MCV 94.7  11/19/2012 0500   LYMPHSABS 0.7 11/12/2012 0500   MONOABS 1.3* 11/12/2012 0500   EOSABS 0.0 11/12/2012 0500   BASOSABS 0.0 11/12/2012 0500    CMP     Component Value Date/Time   NA 141 11/19/2012 0500   K 3.7 11/19/2012 0500   CL 108 11/19/2012 0500   CO2 27 11/19/2012 0500   GLUCOSE 91 11/19/2012 0500   BUN 15 11/19/2012 0500   CREATININE 1.05 11/19/2012  0500   CALCIUM 8.8 11/19/2012 0500   PROT 7.3 11/11/2012 1546   ALBUMIN 3.9 11/11/2012 1546   AST 18 11/11/2012 1546   ALT 15 11/11/2012 1546   ALKPHOS 68 11/11/2012 1546   BILITOT 0.6 11/11/2012 1546   GFRNONAA 69* 11/19/2012 0500   GFRAA 80* 11/19/2012 0500    Assessment and Plan:  D/C home today with wife on the medications he was admitted with to SNF yesterday. There have been no changes He is d/c to home with home health for PT Pt needs to make a follow up appt with Lorenda Peck MD, Internal medicine , [phone] Already scheduled are 2 appt  #1 01/13/2013 at 10:00am with Peter Swaziland, MD , [facility] [phone]                                                       #2  01/20/2013 at 3pm  With [facility] [phone]  Margit Hanks, MD

## 2012-11-20 NOTE — Assessment & Plan Note (Signed)
It was felt pt had a CVA during this HTN episode.Balance being a little worse than usual per wife only deficit and it is getting better.

## 2012-11-20 NOTE — Assessment & Plan Note (Signed)
Pt has known low testosterone-gets a shot monthly

## 2013-01-13 ENCOUNTER — Ambulatory Visit: Payer: Medicare Other | Admitting: Cardiology

## 2013-01-15 ENCOUNTER — Encounter: Payer: Self-pay | Admitting: Physician Assistant

## 2013-01-15 ENCOUNTER — Ambulatory Visit (INDEPENDENT_AMBULATORY_CARE_PROVIDER_SITE_OTHER): Payer: Medicare Other | Admitting: Physician Assistant

## 2013-01-15 VITALS — BP 122/84 | HR 64 | Ht 72.0 in | Wt 176.0 lb

## 2013-01-15 DIAGNOSIS — I674 Hypertensive encephalopathy: Secondary | ICD-10-CM

## 2013-01-15 DIAGNOSIS — I10 Essential (primary) hypertension: Secondary | ICD-10-CM

## 2013-01-15 DIAGNOSIS — I160 Hypertensive urgency: Secondary | ICD-10-CM

## 2013-01-15 DIAGNOSIS — I2510 Atherosclerotic heart disease of native coronary artery without angina pectoris: Secondary | ICD-10-CM

## 2013-01-15 NOTE — Assessment & Plan Note (Signed)
Patient had another admission for hypertensive encephalopathy in August 2014 which required intubation. 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 He would like a second opinion on this diagnosis. He is upset that he is not able to drive. He is going to [place] in November. He has had no further symptoms.

## 2013-01-15 NOTE — Patient Instructions (Signed)
Your physician recommends that you schedule a follow-up appointment in: 2-3 MONTHS WITH [name]  Your physician recommends that you continue on your current medications as directed. Please refer to the Current Medication list given to you today.

## 2013-01-15 NOTE — Assessment & Plan Note (Signed)
Patient has no cardiac complaints

## 2013-01-15 NOTE — Progress Notes (Signed)
HPI:  This is a very pleasant but complicated 72 [place] of [place] who has a history of labile hypertension and hypertensive encephalopathy. About every 6 months he has a severely elevated blood pressure associated with confusion. He was recently admitted in August of 2014 when he was sitting in his primary care's office and developed confusion and garbled speech. His blood pressure spiked and he was transferred to the emergency room. He ended up being intubated and was diagnosed by a neurologist as having complex partial seizures treated with Keppra. The patient and his wife are having a hard time with this diagnosis and are having a second opinion at [facility] November 28. They would also like Benjamin Park to give his opinion as far as this is concerned. Since his hospitalization he has done very well without any more spikes in his blood pressure. He has no cardiac complaints. He denies chest pain, palpitations, dyspnea, dyspnea on exertion, dizziness, or presyncope.  He does have a history of coronary artery disease status post remote PTCA in 2003. His last stress test was in 2007. He also has a history of a subarachnoid hemorrhage, previous TIAs and stroke and chronic aspiration.  No Known Allergies  Current Outpatient Prescriptions on File Prior to Visit: aspirin 325 MG tablet, Take 325 mg by mouth daily.  , Disp: , Rfl:  fish oil-omega-3 fatty acids 1000 MG capsule, Take 1 g by mouth daily. , Disp: , Rfl:  GLUCOSAMINE PO, Take 1 tablet by mouth daily. , Disp: , Rfl:  labetalol (NORMODYNE) 200 MG tablet, Take 1 tablet (200 mg total) by mouth 2 (two) times daily., Disp: 60 tablet, Rfl: 0 levETIRAcetam (KEPPRA) 500 MG tablet, Take 1 tablet (500 mg total) by mouth 2 (two) times daily., Disp: 60 tablet, Rfl: 0 levothyroxine (SYNTHROID, LEVOTHROID) 100 MCG tablet, Take 100 mcg by mouth daily.  , Disp: , Rfl:  lovastatin (MEVACOR) 40 MG tablet, Take 80 mg by mouth at bedtime. , Disp: , Rfl:   Multiple Vitamin (MULITIVITAMIN WITH MINERALS) TABS, Take 1 tablet by mouth daily.  , Disp: , Rfl:  olmesartan (BENICAR) 20 MG tablet, Take 20 mg by mouth at bedtime., Disp: , Rfl:  testosterone cypionate (DEPOTESTOTERONE CYPIONATE) 200 MG/ML injection, Inject 1 mL (200 mg total) into the muscle every 28 (twenty-eight) days., Disp: 10 mL, Rfl: 5 feeding supplement (ENSURE) PUDG, Take 1 Container by mouth 3 (three) times daily between meals., Disp: 90 Container, Rfl: 0  No current facility-administered medications on file prior to visit.   Past Medical History:   Hypertension                                                 Hx of transient ischemic attack (TIA)                        Hyperlipidemia                                               Coronary artery disease  Throat cancer                                                  Comment:radiation therapy   Hypertensive encephalopathy                                  ED (erectile dysfunction)                                    Personal history of kidney stones                            Subarachnoid hemorrhage                                      Sleep apnea                                                    Comment:on CPAP   Myocardial infarction                                          Comment:12/1991   Headache(784.0)                                              Dysphagia                                                    Hypokalemia                                                  AKI (acute kidney injury)                       02/23/2011    Hypothyroidism                                               Stroke                                                       Unspecified cerebral artery occlusion with cer* 11/08/2012   Past Surgical History:   ANGIOPLASTY  1993           Comment:LAD   CARDIAC CATHETERIZATION                                       CORONARY  ANGIOPLASTY                                          basal cell cancer                                               Comment:head   HERNIA REPAIR                                                 APPENDECTOMY                                                  PATENT FORAMEN OVALE CLOSURE                     2008         EYE SURGERY                                                   COLONOSCOPY                                     N/A 06/18/2012       Comment:Procedure: COLONOSCOPY;  Surgeon: Petra Kuba,              MD;  Location: [facility] ENDOSCOPY;  Service:               Endoscopy;  Laterality: N/A;  h&p in file-hope    HOT HEMOSTASIS                                  N/A 06/18/2012       Comment:Procedure: HOT HEMOSTASIS (ARGON PLASMA               COAGULATION/BICAP);  Surgeon: Petra Kuba, MD;              Location: [facility] ENDOSCOPY;  Service: Endoscopy;                Laterality: N/A;  Review of patient's family history indicates:   Heart failure                  Father                   Heart attack  Brother                  Anemia                         Brother                    Comment: mi   Social History   Marital Status: Married             Spouse Name:                      Years of Education:                 Number of children: 4           Occupational History Occupation          Catering manager                                    Social History Main Topics   Smoking Status: Former Smoker                   Packs/Day: 0.00  Years:           Types: Cigarettes     Quit date: 09/21/1990   Smokeless Status: Never Used                       Alcohol Use: No             Drug Use: No             Sexual Activity: Not on file        Other Topics            Concern   None on file  Social History Narrative   None on file    ROS: See history of present illness otherwise negative   PHYSICAL EXAM: Well-nournished, in no acute  distress. Neck: No JVD, HJR, Bruit, or thyroid enlargement  Lungs: No tachypnea, clear without wheezing, rales, or rhonchi  Cardiovascular: RRR, PMI not displaced, heart sounds normal, no murmurs, gallops, bruit, thrill, or heave.  Abdomen: BS normal. Soft without organomegaly, masses, lesions or tenderness.  Extremities: without cyanosis, clubbing or edema. Good distal pulses bilateral  SKin: Warm, no lesions or rashes   Musculoskeletal: No deformities  Neuro: no focal signs  BP 122/84  Pulse 64  Ht 6' (1.829 m)  Wt 176 lb (79.833 kg)  BMI 23.86 kg/m2     2Decho 02/2011: Study Conclusions  Left ventricle: The cavity size was normal. Systolic function was normal. The estimated ejection fraction was in the range of 60% to 65%. Wall motion was normal; there were no regional wall motion abnormalities. Transthoracic echocardiography

## 2013-01-15 NOTE — Assessment & Plan Note (Signed)
Patient had another admission for hypertensive encephalopathy in August 2014 which required intubation. 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 He would like a second opinion on this diagnosis. He is upset that he is not able to drive. He is going to [facility] in November. He has had no further symptoms.  Patient's blood pressure is stable today

## 2013-01-20 ENCOUNTER — Ambulatory Visit: Payer: Medicare Other | Admitting: Nurse Practitioner

## 2013-02-27 ENCOUNTER — Encounter (HOSPITAL_COMMUNITY): Payer: Self-pay | Admitting: *Deleted

## 2013-02-27 DIAGNOSIS — Z87442 Personal history of urinary calculi: Secondary | ICD-10-CM

## 2013-02-27 DIAGNOSIS — R410 Disorientation, unspecified: Secondary | ICD-10-CM

## 2013-02-27 HISTORY — DX: Personal history of urinary calculi: Z87.442

## 2013-02-27 HISTORY — DX: Disorientation, unspecified: R41.0

## 2013-03-03 ENCOUNTER — Encounter (HOSPITAL_COMMUNITY): Payer: Self-pay | Admitting: Pharmacy Technician

## 2013-03-03 ENCOUNTER — Other Ambulatory Visit: Payer: Self-pay | Admitting: Gastroenterology

## 2013-03-05 NOTE — Addendum Note (Signed)
Addended by: Neveah Bang on: 03/05/2013 11:22 AM   Modules accepted: Orders  

## 2013-03-07 ENCOUNTER — Encounter (HOSPITAL_COMMUNITY): Payer: Self-pay | Admitting: Anesthesiology

## 2013-03-07 ENCOUNTER — Telehealth: Payer: Self-pay | Admitting: Cardiology

## 2013-03-07 ENCOUNTER — Ambulatory Visit (HOSPITAL_COMMUNITY): Payer: Medicare Other | Admitting: Anesthesiology

## 2013-03-07 ENCOUNTER — Encounter (HOSPITAL_COMMUNITY): Payer: Medicare Other | Admitting: Anesthesiology

## 2013-03-07 ENCOUNTER — Ambulatory Visit (HOSPITAL_COMMUNITY)
Admission: RE | Admit: 2013-03-07 | Discharge: 2013-03-07 | Disposition: A | Discharge status: Home / Self Care | Payer: Medicare Other | Source: Ambulatory Visit | Attending: Gastroenterology | Admitting: Gastroenterology

## 2013-03-07 ENCOUNTER — Encounter (HOSPITAL_COMMUNITY)
Admission: RE | Disposition: A | Discharge status: Home / Self Care | Payer: Self-pay | Source: Ambulatory Visit | Attending: Gastroenterology

## 2013-03-07 DIAGNOSIS — K5730 Diverticulosis of large intestine without perforation or abscess without bleeding: Secondary | ICD-10-CM | POA: Insufficient documentation

## 2013-03-07 DIAGNOSIS — R569 Unspecified convulsions: Secondary | ICD-10-CM | POA: Insufficient documentation

## 2013-03-07 DIAGNOSIS — K648 Other hemorrhoids: Secondary | ICD-10-CM | POA: Insufficient documentation

## 2013-03-07 DIAGNOSIS — K644 Residual hemorrhoidal skin tags: Secondary | ICD-10-CM | POA: Insufficient documentation

## 2013-03-07 DIAGNOSIS — Z85819 Personal history of malignant neoplasm of unspecified site of lip, oral cavity, and pharynx: Secondary | ICD-10-CM | POA: Insufficient documentation

## 2013-03-07 DIAGNOSIS — D126 Benign neoplasm of colon, unspecified: Secondary | ICD-10-CM | POA: Insufficient documentation

## 2013-03-07 DIAGNOSIS — Z860100 Personal history of colon polyps, unspecified: Secondary | ICD-10-CM | POA: Insufficient documentation

## 2013-03-07 DIAGNOSIS — I2510 Atherosclerotic heart disease of native coronary artery without angina pectoris: Secondary | ICD-10-CM | POA: Insufficient documentation

## 2013-03-07 DIAGNOSIS — Z8673 Personal history of transient ischemic attack (TIA), and cerebral infarction without residual deficits: Secondary | ICD-10-CM | POA: Insufficient documentation

## 2013-03-07 DIAGNOSIS — I10 Essential (primary) hypertension: Secondary | ICD-10-CM | POA: Insufficient documentation

## 2013-03-07 DIAGNOSIS — Z87891 Personal history of nicotine dependence: Secondary | ICD-10-CM | POA: Insufficient documentation

## 2013-03-07 DIAGNOSIS — I252 Old myocardial infarction: Secondary | ICD-10-CM | POA: Insufficient documentation

## 2013-03-07 DIAGNOSIS — G4730 Sleep apnea, unspecified: Secondary | ICD-10-CM | POA: Insufficient documentation

## 2013-03-07 HISTORY — DX: Personal history of urinary calculi: Z87.442

## 2013-03-07 HISTORY — DX: Disorientation, unspecified: R41.0

## 2013-03-07 HISTORY — PX: COLONOSCOPY WITH PROPOFOL: SHX5780

## 2013-03-07 HISTORY — DX: Myoneural disorder, unspecified: G70.9

## 2013-03-07 SURGERY — COLONOSCOPY WITH PROPOFOL
Anesthesia: Monitor Anesthesia Care

## 2013-03-07 MED ORDER — PROPOFOL 10 MG/ML IV BOLUS
INTRAVENOUS | Status: AC
  Filled 2013-03-07: qty 20

## 2013-03-07 MED ORDER — SODIUM CHLORIDE 0.9 % IV SOLN: INTRAVENOUS | Status: DC

## 2013-03-07 MED ORDER — PHENYLEPHRINE HCL 10 MG/ML IJ SOLN
INTRAMUSCULAR | Status: DC | PRN
  Administered 2013-03-07 (×2): 40 ug via INTRAVENOUS

## 2013-03-07 MED ORDER — LIDOCAINE HCL (CARDIAC) 20 MG/ML IV SOLN
INTRAVENOUS | Status: AC
  Filled 2013-03-07: qty 5

## 2013-03-07 MED ORDER — PROPOFOL INFUSION 10 MG/ML OPTIME
INTRAVENOUS | Status: DC | PRN
  Administered 2013-03-07: 140 ug/kg/min via INTRAVENOUS

## 2013-03-07 MED ORDER — LIDOCAINE HCL (CARDIAC) 20 MG/ML IV SOLN
INTRAVENOUS | Status: DC | PRN
  Administered 2013-03-07: 80 mg via INTRAVENOUS

## 2013-03-07 MED ORDER — LACTATED RINGERS IV SOLN
INTRAVENOUS | Status: DC | PRN
  Administered 2013-03-07: 13:00:00 via INTRAVENOUS

## 2013-03-07 MED ORDER — PHENYLEPHRINE 40 MCG/ML (10ML) SYRINGE FOR IV PUSH (FOR BLOOD PRESSURE SUPPORT)
PREFILLED_SYRINGE | INTRAVENOUS | Status: AC
  Filled 2013-03-07: qty 10

## 2013-03-07 SURGICAL SUPPLY — 22 items

## 2013-03-07 NOTE — Anesthesia Preprocedure Evaluation (Signed)
Anesthesia Evaluation  Patient identified by MRN, date of birth, ID band Patient awake    Reviewed: Allergy & Precautions, H&P , NPO status , Patient's Chart, lab work & pertinent test results, reviewed documented beta blocker date and time   Airway Mallampati: II TM Distance: >3 FB Neck ROM: full    Dental  (+) Edentulous Upper and Edentulous Lower   Pulmonary sleep apnea and Continuous Positive Airway Pressure Ventilation , former smoker,  Chronic pulmonary aspiration. History throat cancer breath sounds clear to auscultation  Pulmonary exam normal       Cardiovascular Exercise Tolerance: Good hypertension, Pt. on medications and Pt. on home beta blockers + CAD and + Past MI Rhythm:regular Rate:Normal  MI 1993. S/p angioplasty   Neuro/Psych  Headaches, Seizures -, Well Controlled,  Subarachnoid hemorrhage - no residual.  Epilepsy.  Hypertensive encephalopathy.  Possible TIAs TIAnegative psych ROS   GI/Hepatic negative GI ROS, Neg liver ROS,   Endo/Other  negative endocrine ROSHypothyroidism   Renal/GU negative Renal ROS  negative genitourinary   Musculoskeletal   Abdominal   Peds  Hematology negative hematology ROS (+)   Anesthesia Other Findings   Reproductive/Obstetrics negative OB ROS                           Anesthesia Physical Anesthesia Plan  ASA: III  Anesthesia Plan: MAC   Post-op Pain Management:    Induction:   Airway Management Planned: Simple Face Mask  Additional Equipment:   Intra-op Plan:   Post-operative Plan:   Informed Consent: I have reviewed the patients History and Physical, chart, labs and discussed the procedure including the risks, benefits and alternatives for the proposed anesthesia with the patient or authorized representative who has indicated his/her understanding and acceptance.   Dental Advisory Given  Plan Discussed with: CRNA and  Surgeon  Anesthesia Plan Comments:         Anesthesia Quick Evaluation

## 2013-03-07 NOTE — Telephone Encounter (Signed)
Returned call to patient's wife she stated husband had colonoscopy today.Stated Dr.Magod removed a polyp and wanted to check with Dr.Jordan to see if ok to hold aspirin for 5 to 7 days.Message sent to Dr.Jordan.

## 2013-03-07 NOTE — Progress Notes (Signed)
Benjamin Park 12:54 PM  Subjective: Patient without any GI problems or problems with the prep and no new medical problems since I seen him  Objective: Vital signs stable afebrile no acute distress exam please see pre-assessment evaluation  Assessment: Difficult to remove colon polyp Plan: Okay to proceed with colonoscopy and anesthesia today  Benjamin Park E

## 2013-03-07 NOTE — Telephone Encounter (Signed)
New message    Had colonscopy today---can he stop aspirin starting today for 5-7days?

## 2013-03-07 NOTE — Anesthesia Postprocedure Evaluation (Signed)
  Anesthesia Post-op Note  Patient: Benjamin Park  Procedure(s) Performed: Procedure(s) (LRB): COLONOSCOPY WITH PROPOFOL (N/A)  Patient Location: PACU  Anesthesia Type: MAC  Level of Consciousness: awake and alert   Airway and Oxygen Therapy: Patient Spontanous Breathing  Post-op Pain: mild  Post-op Assessment: Post-op Vital signs reviewed, Patient's Cardiovascular Status Stable, Respiratory Function Stable, Patent Airway and No signs of Nausea or vomiting  Last Vitals:  Filed Vitals:   03/07/13 1440  BP: 125/69  Pulse:   Temp:   Resp: 14    Post-op Vital Signs: stable   Complications: No apparent anesthesia complications

## 2013-03-07 NOTE — Telephone Encounter (Signed)
Returned call to patient's wife Dr.Jordan advised ok to hold aspirin 5 to 7 days.

## 2013-03-07 NOTE — Transfer of Care (Signed)
Immediate Anesthesia Transfer of Care Note  Patient: Benjamin Park  Procedure(s) Performed: Procedure(s): COLONOSCOPY WITH PROPOFOL (N/A)  Patient Location: PACU  Anesthesia Type:MAC  Level of Consciousness: awake and alert   Airway & Oxygen Therapy: Patient Spontanous Breathing and Patient connected to face mask oxygen  Post-op Assessment: Report given to PACU RN and Post -op Vital signs reviewed and stable  Post vital signs: Reviewed and stable  Complications: No apparent anesthesia complications

## 2013-03-07 NOTE — Op Note (Signed)
[facility] [address] [place], [address]   COLONOSCOPY PROCEDURE REPORT  PATIENT: [name], [name]  MR#: [id number] BIRTHDATE: 09/02/1940 , 71  yrs. old GENDER: Male ENDOSCOPIST: Vida Rigger, MD REFERRED XB:[id number] Su Hilt, M.D. PROCEDURE DATE:  03/07/2013 PROCEDURE:   Colonoscopy with tissue ablation using APC and Colonoscopy with biopsy and snare piecemeal polypectomy ASA CLASS:   Class II INDICATIONS:Patient's personal history of adenomatous colon polyp difficult to remove. MEDICATIONS: propofol (Diprivan) 350mg  IV  DESCRIPTION OF PROCEDURE:   After the risks benefits and alternatives of the procedure were thoroughly explained, informed consent was obtained.  The Pentax Colonoscope [id number] endoscope-adult colonoscope was introduced through the anus and advanced to the cecum, which was identified by both the appendix and ileocecal valve , limited by No adverse events experienced. the advancement did not require any abdominal pressure or any position changes and other than some scattered left-sided diverticuli no other abnormalities were seen except for the residual polyp andin the tattoo in the hepatic fleck The quality of the prep was adequate. .  The instrument was then slowly withdrawn as the colon was fully examined.he had a tiny cecal polyp which was cold biopsied and put in the first container and then we withdrew back to the residual polyp and in the customary piecemeal fashion using the stiffer snare multiple hot and cold snares were done and then we will ahead using the APC and coagulated any possible residual polypoid that was seen and none was visible when we were through prior to using the APC the scope was slowly withdrawn back to the rectum and other than the left-sided diverticuli no additional findings were seen anal rectal pull-through and retroflexion was done in the rectum which confirmed some small hemorrhoids and then the scope was  readvanced to the hepatic flexure and the APC was done air and water was suctioned the scope was removed the patient tolerated the procedure well there was no obvious immediate complication       FINDINGS:  1 internal/external small hemorrhoid 2. A few left-sided diverticuli 3 residual hepatic flexure polyp status post multiple piecemeal snares hot and cold and APC 4. Tiny cecal polyp cold biopsied 5. Otherwise within normal limits to the cecum  COMPLICATIONS: none  IMPRESSION:  above  RECOMMENDATIONS: await pathology call when necessary hold aspirin for 3 days followup when necessary repeat colonoscopy in one to 3 years pending pathology but if just adenomatous tissue probably 2   _______________________________ eSigned:  Vida Rigger, MD 03/07/2013 2:27 PM   FA:[id number] Su Hilt, MD  PATIENT NAME:  Benjamin Park, Benjamin Park MR#: [id number]

## 2013-03-07 NOTE — Telephone Encounter (Signed)
OK to hold ASA for 5-7 days.  Peter Swaziland MD, [facility]

## 2013-03-10 ENCOUNTER — Encounter (HOSPITAL_COMMUNITY): Payer: Self-pay | Admitting: Gastroenterology

## 2013-09-01 ENCOUNTER — Ambulatory Visit: Payer: Medicare Other | Admitting: Cardiology

## 2013-09-05 ENCOUNTER — Ambulatory Visit: Payer: Medicare Other | Admitting: Cardiology

## 2013-11-05 ENCOUNTER — Ambulatory Visit (INDEPENDENT_AMBULATORY_CARE_PROVIDER_SITE_OTHER): Payer: Medicare HMO | Admitting: Cardiology

## 2013-11-05 ENCOUNTER — Encounter: Payer: Self-pay | Admitting: Cardiology

## 2013-11-05 VITALS — BP 104/60 | HR 67 | Ht 72.0 in | Wt 165.0 lb

## 2013-11-05 DIAGNOSIS — I10 Essential (primary) hypertension: Secondary | ICD-10-CM

## 2013-11-05 DIAGNOSIS — I674 Hypertensive encephalopathy: Secondary | ICD-10-CM

## 2013-11-05 DIAGNOSIS — R0989 Other specified symptoms and signs involving the circulatory and respiratory systems: Secondary | ICD-10-CM

## 2013-11-05 MED ORDER — CLONIDINE HCL 0.1 MG PO TABS: 0.1000 mg | ORAL_TABLET | ORAL | Status: DC | PRN

## 2013-11-05 NOTE — Patient Instructions (Signed)
Continue labetolol. Stop Benicar  Take one half Clonidine as needed for Blood pressure greater than 160. May repeat in one hour if Blood pressure remains high.  You should not drive.

## 2013-11-06 ENCOUNTER — Other Ambulatory Visit: Payer: Self-pay

## 2013-11-06 DIAGNOSIS — I10 Essential (primary) hypertension: Secondary | ICD-10-CM

## 2013-11-06 MED ORDER — CLONIDINE HCL 0.1 MG PO TABS: ORAL_TABLET | ORAL | Status: DC

## 2013-11-06 NOTE — Progress Notes (Signed)
Benjamin Park Date of Birth: 08/19/1940   History of Present Illness: Benjamin Park is seen today for followup. He he has a history of labile hypertension and hypertensive encephalopathy. He has intermittent severely elevated blood pressure associated with confusion.  He also reports that at times his blood pressure will get too low and he gets very fatigued at those times as well. He does have chronic aspiration and coughs with every meal. He does not use his Thick-it. He denies any chest pain or shortness of breath. He is being treated for Parkinson's symptoms now. He has not been taking Benicar since January due to low BP. Family is concerned that BP issues are getting worse. In July BP went up to 213/104 associated with confusion, nausea and vomiting, incontinence. He takes 0.1 mg of Clonidine that brings his BP down rapidly and often results in hypotension. Family notes increase memory deficits and weakness with stumbling and falls.    Current Outpatient Prescriptions on File Prior to Visit  Medication Sig Dispense Refill  . aspirin 325 MG tablet Take 325 mg by mouth daily.        . carbidopa-levodopa (SINEMET IR) 25-100 MG per tablet Take 1 tablet by mouth 3 (three) times daily.      . fish oil-omega-3 fatty acids 1000 MG capsule Take 1 g by mouth daily.       Marland Kitchen GLUCOSAMINE HCL PO Take 1 tablet by mouth daily.      Marland Kitchen labetalol (NORMODYNE) 200 MG tablet Take 1 tablet (200 mg total) by mouth 2 (two) times daily.  60 tablet  0  . levothyroxine (SYNTHROID, LEVOTHROID) 100 MCG tablet Take 100 mcg by mouth at bedtime.       . lovastatin (MEVACOR) 40 MG tablet Take 80 mg by mouth at bedtime.       . Multiple Vitamin (MULITIVITAMIN WITH MINERALS) TABS Take 1 tablet by mouth daily.        Marland Kitchen testosterone cypionate (DEPOTESTOTERONE CYPIONATE) 200 MG/ML injection Inject 1 mL (200 mg total) into the muscle every 28 (twenty-eight) days.  10 mL  5   No current facility-administered medications on file  prior to visit.    No Known Allergies  Past Medical History  Diagnosis Date  . Hx of transient ischemic attack (TIA)   . Hyperlipidemia   . ED (erectile dysfunction)   . Personal history of kidney stones   . Subarachnoid hemorrhage     '11- hospitalized -3 to 4 days(passed out-awoke in hospital)- no residual affects.  . Myocardial infarction     12/1991  . Dysphagia   . Hypokalemia   . Hypothyroidism   . Sleep apnea     on CPAP-settings 6  . History of kidney stones 02-27-13    x5- none recent  . Throat cancer     '00-radiation therapy  . Stroke   . Unspecified cerebral artery occlusion with cerebral infarction 11/08/2012  . Episodic confusion 02-27-13    hx. of, not currently(multiple episodes- ? TIA's)-always saw MD to be evaluated.  Marland Kitchen AKI (acute kidney injury) 02/23/2011    pt. denies any problems 02-27-13  . Hypertension     hx. labile - with spikes every 3-6 mos.-last 8'14 ED visit.  Marland Kitchen Hypertensive encephalopathy   . Headache(784.0)     episodic with periods of confusion -multiple  . Neuromuscular disorder     "parkinson's" -no tremor-sees neurology MD at [facility]  . Coronary artery disease     Dr.  Martinique    Past Surgical History  Procedure Laterality Date  . Angioplasty  1993    LAD  . Cardiac catheterization    . Coronary angioplasty    . Basal cell cancer      head  . Hernia repair    . Appendectomy    . Patent foramen ovale closure  2008  . Eye surgery    . Colonoscopy N/A 06/18/2012    Procedure: COLONOSCOPY;  Surgeon: Jeryl Columbia, MD;  Location: [facility] ENDOSCOPY;  Service: Endoscopy;  Laterality: N/A;  h&p in file-[facility]   . Hot hemostasis N/A 06/18/2012    Procedure: HOT HEMOSTASIS (ARGON PLASMA COAGULATION/BICAP);  Surgeon: Jeryl Columbia, MD;  Location: [facility] ENDOSCOPY;  Service: Endoscopy;  Laterality: N/A;  . Colonoscopy with propofol N/A 03/07/2013    Procedure: COLONOSCOPY WITH PROPOFOL;  Surgeon: Jeryl Columbia, MD;  Location: [facility] ENDOSCOPY;  Service:  Endoscopy;  Laterality: N/A;    History  Smoking status  . Former Smoker  . Types: Cigarettes  . Quit date: 09/21/1990  Smokeless tobacco  . Never Used    History  Alcohol Use No    Family History  Problem Relation Age of Onset  . Heart failure Father   . Heart attack Brother   . Anemia Brother 26    mi    Review of Systems: As noted in history of present illness.  All other systems were reviewed and are negative.  Physical Exam: BP 104/60  Pulse 67  Ht 6' (1.829 m)  Wt 165 lb (74.844 kg)  BMI 22.37 kg/m2 He is a pleasant white male in no acute distress. He is normocephalic, atraumatic. Pupils are equal round and reactive. Oropharynx is clear. Sclera clear. Neck is supple without JVD, adenopathy, thyromegaly, or bruits. Lungs are clear. Cardiac exam reveals a regular rate and rhythm without gallop, murmur, or click. Abdomen is soft and nontender without masses or bruits. He has no edema. Pedal pulses are good. He is alert oriented x3. Cranial nerves II through XII are intact. His exam is nonfocal. Memory is poor.  LABORATORY DATA: Ecg: NSR with normal Ecg.  Assessment / Plan: 1. Labile hypertension with history of hypertensive encephalopathy. It appears his blood pressure is well controlled now on labetolol only.  He takes clonidine as needed for elevation. I recommend he only take 0.05 mg as needed to try and avoid hypotension. 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 I have recommended that he not drive.   2. Coronary disease with remote PTCA in 2003. He is asymptomatic. His last stress test was in 2007.  3. History of subarachnoid hemorrhage.  4. History of TIA/stroke.  5. Chronic aspiration. This has clearly been documented on prior swallowing evaluation. I've encouraged him to use his thick it to minimize the risk for aspiration pneumonia.

## 2013-11-06 NOTE — Telephone Encounter (Signed)
Rx was sent to pharmacy electronically. 

## 2013-12-22 ENCOUNTER — Encounter (HOSPITAL_COMMUNITY): Payer: Self-pay | Admitting: Emergency Medicine

## 2013-12-22 ENCOUNTER — Emergency Department (HOSPITAL_COMMUNITY)
Admission: EM | Admit: 2013-12-22 | Discharge: 2013-12-22 | Disposition: A | Discharge status: Home / Self Care | Payer: Medicare HMO | Attending: Emergency Medicine | Admitting: Emergency Medicine

## 2013-12-22 ENCOUNTER — Emergency Department (HOSPITAL_COMMUNITY): Payer: Medicare HMO

## 2013-12-22 DIAGNOSIS — N529 Male erectile dysfunction, unspecified: Secondary | ICD-10-CM | POA: Insufficient documentation

## 2013-12-22 DIAGNOSIS — I2510 Atherosclerotic heart disease of native coronary artery without angina pectoris: Secondary | ICD-10-CM | POA: Diagnosis not present

## 2013-12-22 DIAGNOSIS — Z87442 Personal history of urinary calculi: Secondary | ICD-10-CM | POA: Insufficient documentation

## 2013-12-22 DIAGNOSIS — E039 Hypothyroidism, unspecified: Secondary | ICD-10-CM | POA: Insufficient documentation

## 2013-12-22 DIAGNOSIS — Z982 Presence of cerebrospinal fluid drainage device: Secondary | ICD-10-CM | POA: Insufficient documentation

## 2013-12-22 DIAGNOSIS — E785 Hyperlipidemia, unspecified: Secondary | ICD-10-CM | POA: Insufficient documentation

## 2013-12-22 DIAGNOSIS — G4730 Sleep apnea, unspecified: Secondary | ICD-10-CM | POA: Diagnosis not present

## 2013-12-22 DIAGNOSIS — Z7982 Long term (current) use of aspirin: Secondary | ICD-10-CM | POA: Insufficient documentation

## 2013-12-22 DIAGNOSIS — Z9889 Other specified postprocedural states: Secondary | ICD-10-CM | POA: Diagnosis not present

## 2013-12-22 DIAGNOSIS — R338 Other retention of urine: Secondary | ICD-10-CM | POA: Diagnosis not present

## 2013-12-22 DIAGNOSIS — Z9861 Coronary angioplasty status: Secondary | ICD-10-CM | POA: Insufficient documentation

## 2013-12-22 DIAGNOSIS — Z87891 Personal history of nicotine dependence: Secondary | ICD-10-CM | POA: Insufficient documentation

## 2013-12-22 DIAGNOSIS — K5909 Other constipation: Secondary | ICD-10-CM | POA: Diagnosis not present

## 2013-12-22 DIAGNOSIS — Z8673 Personal history of transient ischemic attack (TIA), and cerebral infarction without residual deficits: Secondary | ICD-10-CM | POA: Insufficient documentation

## 2013-12-22 DIAGNOSIS — I252 Old myocardial infarction: Secondary | ICD-10-CM | POA: Diagnosis not present

## 2013-12-22 DIAGNOSIS — Z79899 Other long term (current) drug therapy: Secondary | ICD-10-CM | POA: Diagnosis not present

## 2013-12-22 DIAGNOSIS — K5904 Chronic idiopathic constipation: Secondary | ICD-10-CM

## 2013-12-22 DIAGNOSIS — Z923 Personal history of irradiation: Secondary | ICD-10-CM | POA: Diagnosis not present

## 2013-12-22 DIAGNOSIS — Z8669 Personal history of other diseases of the nervous system and sense organs: Secondary | ICD-10-CM | POA: Diagnosis not present

## 2013-12-22 DIAGNOSIS — Z85818 Personal history of malignant neoplasm of other sites of lip, oral cavity, and pharynx: Secondary | ICD-10-CM | POA: Insufficient documentation

## 2013-12-22 LAB — URINALYSIS, ROUTINE W REFLEX MICROSCOPIC
BILIRUBIN URINE: NEGATIVE
Glucose, UA: NEGATIVE mg/dL
Hgb urine dipstick: NEGATIVE
KETONES UR: 15 mg/dL — AB
Leukocytes, UA: NEGATIVE
Nitrite: NEGATIVE
PH: 6 (ref 5.0–8.0)
Protein, ur: NEGATIVE mg/dL
Specific Gravity, Urine: 1.02 (ref 1.005–1.030)
Urobilinogen, UA: 0.2 mg/dL (ref 0.0–1.0)

## 2013-12-22 NOTE — ED Provider Notes (Signed)
CSN: [id number]     Arrival date & time 12/22/13  2038 History   First MD Initiated Contact with Patient 12/22/13 2202     Chief Complaint  Patient presents with  . Urinary Retention     (Consider location/radiation/quality/duration/timing/severity/associated sxs/prior Treatment) HPI Comments: Since getting up this morning had been unable to urinate nor have BM  Has not tied any medications to alleviate symptoms On arrival to ED patient was able to urinate without difficulty but still feels as though he needs to have a BM.  Denies pain, N/V, Hx of obstructions--had normal colonoscopy 12/14.   The history is provided by the patient.    Past Medical History  Diagnosis Date  . Hx of transient ischemic attack (TIA)   . Hyperlipidemia   . ED (erectile dysfunction)   . Personal history of kidney stones   . Subarachnoid hemorrhage     '11- hospitalized -3 to 4 days(passed out-awoke in hospital)- no residual affects.  . Myocardial infarction     12/1991  . Dysphagia   . Hypokalemia   . Hypothyroidism   . Sleep apnea     on CPAP-settings 6  . History of kidney stones 02-27-13    x5- none recent  . Throat cancer     '00-radiation therapy  . Stroke   . Unspecified cerebral artery occlusion with cerebral infarction 11/08/2012  . Episodic confusion 02-27-13    hx. of, not currently(multiple episodes- ? TIA's)-always saw MD to be evaluated.  Marland Kitchen AKI (acute kidney injury) 02/23/2011    pt. denies any problems 02-27-13  . Hypertension     hx. labile - with spikes every 3-6 mos.-last 8'14 ED visit.  Marland Kitchen Hypertensive encephalopathy   . Headache(784.0)     episodic with periods of confusion -multiple  . Neuromuscular disorder     "parkinson's" -no tremor-sees neurology MD at [facility]  . Coronary artery disease     Dr. Martinique   Past Surgical History  Procedure Laterality Date  . Angioplasty  1993    LAD  . Cardiac catheterization    . Coronary angioplasty    . Basal cell cancer     head  . Hernia repair    . Appendectomy    . Patent foramen ovale closure  2008  . Eye surgery    . Colonoscopy N/A 06/18/2012    Procedure: COLONOSCOPY;  Surgeon: Jeryl Columbia, MD;  Location: [facility] ENDOSCOPY;  Service: Endoscopy;  Laterality: N/A;  h&p in file-[facility]   . Hot hemostasis N/A 06/18/2012    Procedure: HOT HEMOSTASIS (ARGON PLASMA COAGULATION/BICAP);  Surgeon: Jeryl Columbia, MD;  Location: [facility] ENDOSCOPY;  Service: Endoscopy;  Laterality: N/A;  . Colonoscopy with propofol N/A 03/07/2013    Procedure: COLONOSCOPY WITH PROPOFOL;  Surgeon: Jeryl Columbia, MD;  Location: [facility] ENDOSCOPY;  Service: Endoscopy;  Laterality: N/A;   Family History  Problem Relation Age of Onset  . Heart failure Father   . Heart attack Brother   . Anemia Brother 45    mi   History  Substance Use Topics  . Smoking status: Former Smoker    Types: Cigarettes    Quit date: 09/21/1990  . Smokeless tobacco: Never Used  . Alcohol Use: No    Review of Systems  Constitutional: Negative for fever.  Gastrointestinal: Positive for constipation. Negative for nausea, vomiting, diarrhea, abdominal distention, anal bleeding and rectal pain.  Genitourinary: Positive for decreased urine volume and difficulty urinating. Negative for dysuria, frequency and testicular  pain.  Neurological: Negative for headaches.  All other systems reviewed and are negative.     Allergies  Review of patient's allergies indicates no known allergies.  Home Medications   Prior to Admission medications   Medication Sig Start Date End Date Taking? Authorizing Provider  aspirin 325 MG tablet Take 325 mg by mouth daily.      Historical Provider, MD  carbidopa-levodopa (SINEMET IR) 25-100 MG per tablet Take 1 tablet by mouth 3 (three) times daily.    Historical Provider, MD  cloNIDine (CATAPRES) 0.1 MG tablet Take 0.5 tablet by mouth as needed for blood pressure than 160. 11/06/13   Peter M Martinique, MD  fish oil-omega-3 fatty acids 1000 MG capsule  Take 1 g by mouth daily.     Historical Provider, MD  GLUCOSAMINE HCL PO Take 1 tablet by mouth daily.    Historical Provider, MD  labetalol (NORMODYNE) 200 MG tablet Take 1 tablet (200 mg total) by mouth 2 (two) times daily. 11/19/12   Monika Salk, MD  levothyroxine (SYNTHROID, LEVOTHROID) 100 MCG tablet Take 100 mcg by mouth at bedtime.     Historical Provider, MD  lovastatin (MEVACOR) 40 MG tablet Take 80 mg by mouth at bedtime.     Historical Provider, MD  Multiple Vitamin (MULITIVITAMIN WITH MINERALS) TABS Take 1 tablet by mouth daily.      Historical Provider, MD  testosterone cypionate (DEPOTESTOTERONE CYPIONATE) 200 MG/ML injection Inject 1 mL (200 mg total) into the muscle every 28 (twenty-eight) days. 11/20/12   Mahima Pandey, MD   BP 131/93  Pulse 87  Temp(Src) 98.1 F (36.7 C) (Oral)  Resp 16  Ht 6' (1.829 m)  Wt 170 lb (77.111 kg)  BMI 23.05 kg/m2  SpO2 100% Physical Exam  Nursing note and vitals reviewed. Constitutional: He appears well-developed and well-nourished.  HENT:  Head: Normocephalic.  Eyes: Pupils are equal, round, and reactive to light.  Neck: Normal range of motion.  Cardiovascular: Normal rate.   Pulmonary/Chest: Effort normal.  Abdominal: Soft. He exhibits no distension. There is no tenderness.  Musculoskeletal: Normal range of motion.  Neurological: He is alert.  Skin: Skin is warm.    ED Course  Procedures (including critical care time) Labs Review Labs Reviewed  URINALYSIS, ROUTINE W REFLEX MICROSCOPIC - Abnormal; Notable for the following:    Ketones, ur 15 (*)    All other components within normal limits  URINE CULTURE    Imaging Review Dg Abd Acute W/chest  12/22/2013   CLINICAL DATA:  Acute onset of mid abdominal pain and constipation. Initial encounter.  EXAM: ACUTE ABDOMEN SERIES (ABDOMEN 2 VIEW & CHEST 1 VIEW)  COMPARISON:  Chest radiograph performed 11/14/2012, and abdominal radiograph performed 03/02/2006  FINDINGS: The lungs are  well-aerated and clear. There is no evidence of focal opacification, pleural effusion or pneumothorax. The cardiomediastinal silhouette is within normal limits.  The visualized bowel gas pattern is unremarkable. Scattered stool and air are seen within the colon; there is no evidence of small bowel dilatation to suggest obstruction. No free intra-abdominal air is identified on the provided upright view. A mesh is noted along the left hemipelvis. A metallic device is seen overlying the mediastinum, unchanged from 2014.  No acute osseous abnormalities are seen; the sacroiliac joints are unremarkable in appearance.  IMPRESSION: 1. Unremarkable bowel gas pattern; no free intra-abdominal air seen. Small to moderate amount of stool noted in the colon, predominantly along the cecum and ascending colon. 2. No acute  cardiopulmonary process seen.   Electronically Signed   By: Garald Balding M.D.   On: 12/22/2013 23:09     EKG Interpretation None     urinating freeing since arrival  Urine and xar norma  Some stool in colon but no obstruction  Bladder scan prior to urination 198 than patient voided freeing  MDM   Final diagnoses:  Acute urinary retention  Constipation - functional         Garald Balding, NP 12/22/13 2336

## 2013-12-22 NOTE — ED Notes (Signed)
Pt c/o urinary retention starting today. Pt states he was unable to void all day and also having constipation. Pt states while he was waiting in the waiting room pt was able to urinate. Pt reports normal stream, denies abdominal pain, denies dysuria and hematuria. Pt denies ever having urinary retention in the past.

## 2013-12-22 NOTE — ED Notes (Signed)
Declined W/C at D/C and was escorted to lobby by RN. 

## 2013-12-22 NOTE — ED Notes (Signed)
Pt reported having pain in lower stomach and intestine all day. Pt was able to void after arrival to ED and is now pain free.

## 2013-12-22 NOTE — Discharge Instructions (Signed)
Acute Urinary Retention Acute urinary retention is when you are unable to pee (urinate). Acute urinary retention is common in older men. Prostates can get bigger, which blocks the flow of pee.  HOME CARE  Drink enough fluids to keep your pee clear or pale yellow.  If you are sent home with a tube that drains the bladder (catheter), there will be a drainage bag attached to it. There are two types of bags. One is big that you can wear at night without having to empty it. One is smaller and needs to be emptied more often.  Keep the drainage bag empty.  Keep the drainage bag lower than your catheter.  Only take medicine as told by your doctor. GET HELP IF:  You have a low-grade fever.  You have spasms or you are leaking pee when you have spasms. GET HELP RIGHT AWAY IF:   You have chills or a fever.  Your catheter stops draining pee.  Your catheter falls out.  You have increased bleeding that does not stop after you have rested and increased the amount of fluids you had been drinking. MAKE SURE YOU:   Understand these instructions.  Will watch your condition.  Will get help right away if you are not doing well or get worse. Document Released: 08/23/2007 Document Revised: 12/25/2012 Document Reviewed: 08/15/2012 [facility] Patient Information 2015 [place], [place]. This information is not intended to replace advice given to you by your health care provider. Make sure you discuss any questions you have with your health care provider. Your blood pressure in the ED was 131/93 Your urine and xray are normal you have a small amount of stool in your colon but no obstruction. Take Miralax on a regular basis to allow for softer stools Follow up with Urology if you continue to have intermittent episodes of urinary retention  Constipation Constipation is when a person:  Poops (has a bowel movement) less than 3 times a week.  Has a hard time pooping.  Has poop that is dry, hard, or bigger  than normal. HOME CARE   Eat foods with a lot of fiber in them. This includes fruits, vegetables, beans, and whole grains such as brown rice.  Avoid fatty foods and foods with a lot of sugar. This includes french fries, hamburgers, cookies, candy, and soda.  If you are not getting enough fiber from food, take products with added fiber in them (supplements).  Drink enough fluid to keep your pee (urine) clear or pale yellow.  Exercise on a regular basis, or as told by your doctor.  Go to the restroom when you feel like you need to poop. Do not hold it.  Only take medicine as told by your doctor. Do not take medicines that help you poop (laxatives) without talking to your doctor first. GET HELP RIGHT AWAY IF:   You have bright red blood in your poop (stool).  Your constipation lasts more than 4 days or gets worse.  You have belly (abdominal) or butt (rectal) pain.  You have thin poop (as thin as a pencil).  You lose weight, and it cannot be explained. MAKE SURE YOU:   Understand these instructions.  Will watch your condition.  Will get help right away if you are not doing well or get worse. Document Released: 08/23/2007 Document Revised: 03/11/2013 Document Reviewed: 12/16/2012 [facility] Patient Information 2015 [place], [place]. This information is not intended to replace advice given to you by your health care provider. Make sure you  discuss any questions you have with your health care provider. ° °

## 2013-12-23 NOTE — ED Provider Notes (Signed)
Medical screening examination/treatment/procedure(s) were conducted as a shared visit with non-physician practitioner(s) and myself.  I personally evaluated the patient during the encounter.   EKG Interpretation None      Benjamin Park is a 73 y.o. male here with possible urinary retention. Has been constipated, dec urine output since 2pm today. However, was able to urinate in the ED. Denies vomiting or fevers. Per wife, BP has been volatile but took clonidine at home and BP 130s in the ED. No abdominal pain or chest pain. On exam, minimal suprapubic tenderness. Able to urinate several times in the ED. Bladder scan was 198 prior to urination and patient able to urinate afterwards. Xray showed constipation. Recommend miralax daily, urology f/u.    Wandra Arthurs, MD 12/23/13 [phone]

## 2013-12-26 LAB — URINE CULTURE

## 2014-01-31 ENCOUNTER — Encounter (HOSPITAL_COMMUNITY): Payer: Self-pay | Admitting: Cardiology

## 2014-01-31 ENCOUNTER — Inpatient Hospital Stay (HOSPITAL_COMMUNITY): Payer: Medicare HMO

## 2014-01-31 ENCOUNTER — Emergency Department (HOSPITAL_COMMUNITY): Payer: Medicare HMO

## 2014-01-31 ENCOUNTER — Inpatient Hospital Stay (HOSPITAL_COMMUNITY)
Admission: EM | Admit: 2014-01-31 | Discharge: 2014-02-03 | DRG: 305 | Disposition: A | Discharge status: Home Health | Payer: Medicare HMO | Attending: Internal Medicine | Admitting: Internal Medicine

## 2014-01-31 DIAGNOSIS — Z955 Presence of coronary angioplasty implant and graft: Secondary | ICD-10-CM

## 2014-01-31 DIAGNOSIS — Z85819 Personal history of malignant neoplasm of unspecified site of lip, oral cavity, and pharynx: Secondary | ICD-10-CM | POA: Diagnosis not present

## 2014-01-31 DIAGNOSIS — R1310 Dysphagia, unspecified: Secondary | ICD-10-CM | POA: Diagnosis present

## 2014-01-31 DIAGNOSIS — G9340 Encephalopathy, unspecified: Secondary | ICD-10-CM | POA: Insufficient documentation

## 2014-01-31 DIAGNOSIS — Z8673 Personal history of transient ischemic attack (TIA), and cerebral infarction without residual deficits: Secondary | ICD-10-CM

## 2014-01-31 DIAGNOSIS — Z79899 Other long term (current) drug therapy: Secondary | ICD-10-CM

## 2014-01-31 DIAGNOSIS — I6521 Occlusion and stenosis of right carotid artery: Secondary | ICD-10-CM | POA: Diagnosis present

## 2014-01-31 DIAGNOSIS — G4733 Obstructive sleep apnea (adult) (pediatric): Secondary | ICD-10-CM | POA: Diagnosis present

## 2014-01-31 DIAGNOSIS — E785 Hyperlipidemia, unspecified: Secondary | ICD-10-CM | POA: Diagnosis present

## 2014-01-31 DIAGNOSIS — F05 Delirium due to known physiological condition: Secondary | ICD-10-CM | POA: Diagnosis present

## 2014-01-31 DIAGNOSIS — D72829 Elevated white blood cell count, unspecified: Secondary | ICD-10-CM

## 2014-01-31 DIAGNOSIS — Z87891 Personal history of nicotine dependence: Secondary | ICD-10-CM | POA: Diagnosis not present

## 2014-01-31 DIAGNOSIS — I252 Old myocardial infarction: Secondary | ICD-10-CM

## 2014-01-31 DIAGNOSIS — R4182 Altered mental status, unspecified: Secondary | ICD-10-CM

## 2014-01-31 DIAGNOSIS — G20 Parkinson's disease: Secondary | ICD-10-CM | POA: Diagnosis present

## 2014-01-31 DIAGNOSIS — I609 Nontraumatic subarachnoid hemorrhage, unspecified: Secondary | ICD-10-CM

## 2014-01-31 DIAGNOSIS — I6931 Cognitive deficits following cerebral infarction: Secondary | ICD-10-CM

## 2014-01-31 DIAGNOSIS — Z7982 Long term (current) use of aspirin: Secondary | ICD-10-CM

## 2014-01-31 DIAGNOSIS — I674 Hypertensive encephalopathy: Secondary | ICD-10-CM | POA: Diagnosis present

## 2014-01-31 DIAGNOSIS — Z923 Personal history of irradiation: Secondary | ICD-10-CM | POA: Diagnosis not present

## 2014-01-31 DIAGNOSIS — I2510 Atherosclerotic heart disease of native coronary artery without angina pectoris: Secondary | ICD-10-CM | POA: Diagnosis present

## 2014-01-31 DIAGNOSIS — E039 Hypothyroidism, unspecified: Secondary | ICD-10-CM | POA: Diagnosis present

## 2014-01-31 DIAGNOSIS — I10 Essential (primary) hypertension: Secondary | ICD-10-CM | POA: Diagnosis not present

## 2014-01-31 DIAGNOSIS — E876 Hypokalemia: Secondary | ICD-10-CM | POA: Diagnosis present

## 2014-01-31 LAB — I-STAT TROPONIN, ED: TROPONIN I, POC: 0.01 ng/mL (ref 0.00–0.08)

## 2014-01-31 LAB — COMPREHENSIVE METABOLIC PANEL
ALT: 14 U/L (ref 0–53)
AST: 25 U/L (ref 0–37)
Albumin: 3.9 g/dL (ref 3.5–5.2)
Alkaline Phosphatase: 72 U/L (ref 39–117)
Anion gap: 13 (ref 5–15)
BILIRUBIN TOTAL: 0.4 mg/dL (ref 0.3–1.2)
BUN: 25 mg/dL — ABNORMAL HIGH (ref 6–23)
CHLORIDE: 101 meq/L (ref 96–112)
CO2: 28 mEq/L (ref 19–32)
Calcium: 9.6 mg/dL (ref 8.4–10.5)
Creatinine, Ser: 1.12 mg/dL (ref 0.50–1.35)
GFR calc Af Amer: 74 mL/min — ABNORMAL LOW (ref >90)
GFR calc non Af Amer: 64 mL/min — ABNORMAL LOW (ref >90)
Glucose, Bld: 129 mg/dL — ABNORMAL HIGH (ref 70–99)
POTASSIUM: 4.4 meq/L (ref 3.7–5.3)
SODIUM: 142 meq/L (ref 137–147)
Total Protein: 7.4 g/dL (ref 6.0–8.3)

## 2014-01-31 LAB — CBC
HCT: 46 % (ref 39.0–52.0)
Hemoglobin: 15.5 g/dL (ref 13.0–17.0)
MCH: 33.8 pg (ref 26.0–34.0)
MCHC: 33.7 g/dL (ref 30.0–36.0)
MCV: 100.2 fL — ABNORMAL HIGH (ref 78.0–100.0)
PLATELETS: 171 10*3/uL (ref 150–400)
RBC: 4.59 MIL/uL (ref 4.22–5.81)
RDW: 13.8 % (ref 11.5–15.5)
WBC: 9.9 10*3/uL (ref 4.0–10.5)

## 2014-01-31 LAB — BLOOD GAS, ARTERIAL
Acid-Base Excess: 0.9 mmol/L (ref 0.0–2.0)
Bicarbonate: 24.4 mEq/L — ABNORMAL HIGH (ref 20.0–24.0)
Drawn by: 41977
O2 Content: 5 L/min
O2 SAT: 98.8 %
PATIENT TEMPERATURE: 98.6
PH ART: 7.455 — AB (ref 7.350–7.450)
TCO2: 25.5 mmol/L (ref 0–100)
pCO2 arterial: 35.2 mmHg (ref 35.0–45.0)
pO2, Arterial: 141 mmHg — ABNORMAL HIGH (ref 80.0–100.0)

## 2014-01-31 LAB — DIFFERENTIAL
BASOS PCT: 0 % (ref 0–1)
Basophils Absolute: 0 10*3/uL (ref 0.0–0.1)
Eosinophils Absolute: 0.3 10*3/uL (ref 0.0–0.7)
Eosinophils Relative: 3 % (ref 0–5)
Lymphocytes Relative: 9 % — ABNORMAL LOW (ref 12–46)
Lymphs Abs: 0.9 10*3/uL (ref 0.7–4.0)
MONOS PCT: 4 % (ref 3–12)
Monocytes Absolute: 0.4 10*3/uL (ref 0.1–1.0)
NEUTROS PCT: 84 % — AB (ref 43–77)
Neutro Abs: 8.3 10*3/uL — ABNORMAL HIGH (ref 1.7–7.7)

## 2014-01-31 LAB — TROPONIN I: Troponin I: <0.3 ng/mL (ref <0.30)

## 2014-01-31 LAB — I-STAT CHEM 8, ED
BUN: 26 mg/dL — AB (ref 6–23)
CHLORIDE: 103 meq/L (ref 96–112)
Calcium, Ion: 1.19 mmol/L (ref 1.13–1.30)
Creatinine, Ser: 1.2 mg/dL (ref 0.50–1.35)
GLUCOSE: 132 mg/dL — AB (ref 70–99)
HCT: 48 % (ref 39.0–52.0)
Hemoglobin: 16.3 g/dL (ref 13.0–17.0)
Potassium: 4.2 mEq/L (ref 3.7–5.3)
Sodium: 143 mEq/L (ref 137–147)
TCO2: 27 mmol/L (ref 0–100)

## 2014-01-31 LAB — ETHANOL

## 2014-01-31 LAB — PROTIME-INR
INR: 1.08 (ref 0.00–1.49)
Prothrombin Time: 14.2 seconds (ref 11.6–15.2)

## 2014-01-31 LAB — MRSA PCR SCREENING: MRSA BY PCR: NEGATIVE

## 2014-01-31 LAB — APTT: APTT: 35 s (ref 24–37)

## 2014-01-31 LAB — CBG MONITORING, ED: GLUCOSE-CAPILLARY: 129 mg/dL — AB (ref 70–99)

## 2014-01-31 LAB — AMMONIA: Ammonia: 19 umol/L (ref 11–60)

## 2014-01-31 MED ORDER — NICARDIPINE HCL IN NACL 20-0.86 MG/200ML-% IV SOLN
3.0000 mg/h | INTRAVENOUS | Status: DC
  Administered 2014-01-31: 5 mg/h via INTRAVENOUS

## 2014-01-31 MED ORDER — ASPIRIN 300 MG RE SUPP
300.0000 mg | Freq: Every day | RECTAL | Status: DC
  Administered 2014-01-31 – 2014-02-02 (×3): 300 mg via RECTAL
  Filled 2014-01-31 (×4): qty 1

## 2014-01-31 MED ORDER — LORAZEPAM 2 MG/ML IJ SOLN
0.5000 mg | Freq: Once | INTRAMUSCULAR | Status: DC
  Filled 2014-01-31: qty 1

## 2014-01-31 MED ORDER — LABETALOL HCL 5 MG/ML IV SOLN
INTRAVENOUS | Status: AC
  Filled 2014-01-31: qty 4

## 2014-01-31 MED ORDER — HYDRALAZINE HCL 20 MG/ML IJ SOLN
10.0000 mg | Freq: Once | INTRAMUSCULAR | Status: AC
  Administered 2014-01-31: 10 mg via INTRAVENOUS
  Filled 2014-01-31: qty 1

## 2014-01-31 MED ORDER — LABETALOL HCL 5 MG/ML IV SOLN
10.0000 mg | INTRAVENOUS | Status: AC
  Administered 2014-01-31: 10 mg via INTRAVENOUS

## 2014-01-31 MED ORDER — SENNOSIDES-DOCUSATE SODIUM 8.6-50 MG PO TABS: 1.0000 | ORAL_TABLET | Freq: Every evening | ORAL | Status: DC | PRN

## 2014-01-31 MED ORDER — LABETALOL HCL 5 MG/ML IV SOLN
10.0000 mg | Freq: Once | INTRAVENOUS | Status: AC
  Administered 2014-01-31: 10 mg via INTRAVENOUS

## 2014-01-31 MED ORDER — ONDANSETRON HCL 4 MG/2ML IJ SOLN
4.0000 mg | Freq: Once | INTRAMUSCULAR | Status: AC
  Administered 2014-01-31: 4 mg via INTRAVENOUS
  Filled 2014-01-31: qty 2

## 2014-01-31 MED ORDER — LABETALOL HCL 5 MG/ML IV SOLN
10.0000 mg | Freq: Once | INTRAVENOUS | Status: AC
  Administered 2014-01-31: 10 mg via INTRAVENOUS
  Filled 2014-01-31: qty 4

## 2014-01-31 MED ORDER — ASPIRIN 325 MG PO TABS
325.0000 mg | ORAL_TABLET | Freq: Every day | ORAL | Status: DC
  Administered 2014-02-03: 325 mg via ORAL
  Filled 2014-01-31 (×3): qty 1

## 2014-01-31 MED ORDER — ENOXAPARIN SODIUM 40 MG/0.4ML ~~LOC~~ SOLN
40.0000 mg | SUBCUTANEOUS | Status: DC
Start: 2014-01-31 — End: 2014-01-31

## 2014-01-31 MED ORDER — HALOPERIDOL 2 MG PO TABS
2.0000 mg | ORAL_TABLET | Freq: Four times a day (QID) | ORAL | Status: DC | PRN
  Filled 2014-01-31: qty 1

## 2014-01-31 MED ORDER — HYDRALAZINE HCL 20 MG/ML IJ SOLN
10.0000 mg | INTRAMUSCULAR | Status: DC | PRN
  Administered 2014-02-02: 10 mg via INTRAVENOUS
  Filled 2014-01-31: qty 0.5
  Filled 2014-01-31: qty 1

## 2014-01-31 MED ORDER — DEXTROSE-NACL 5-0.45 % IV SOLN
INTRAVENOUS | Status: DC
  Administered 2014-01-31: 23:00:00 via INTRAVENOUS

## 2014-01-31 MED ORDER — HALOPERIDOL LACTATE 5 MG/ML IJ SOLN
2.0000 mg | Freq: Four times a day (QID) | INTRAMUSCULAR | Status: DC | PRN
  Administered 2014-02-01: 2 mg via INTRAMUSCULAR
  Filled 2014-01-31: qty 1

## 2014-01-31 MED ORDER — ENOXAPARIN SODIUM 40 MG/0.4ML ~~LOC~~ SOLN
40.0000 mg | SUBCUTANEOUS | Status: DC
  Administered 2014-01-31 – 2014-02-02 (×3): 40 mg via SUBCUTANEOUS
  Filled 2014-01-31 (×4): qty 0.4

## 2014-01-31 MED ORDER — LORAZEPAM 2 MG/ML IJ SOLN
2.0000 mg | Freq: Once | INTRAMUSCULAR | Status: AC
  Administered 2014-01-31: 2 mg via INTRAVENOUS

## 2014-01-31 MED ORDER — STROKE: EARLY STAGES OF RECOVERY BOOK
Freq: Once | Status: AC
  Administered 2014-02-01: 01:00:00
  Filled 2014-01-31: qty 1

## 2014-01-31 MED ORDER — LABETALOL HCL 5 MG/ML IV SOLN
10.0000 mg | INTRAVENOUS | Status: DC | PRN
  Filled 2014-01-31: qty 4

## 2014-01-31 MED ORDER — LORAZEPAM 2 MG/ML IJ SOLN
INTRAMUSCULAR | Status: AC
  Filled 2014-01-31: qty 1

## 2014-01-31 MED ORDER — HALOPERIDOL LACTATE 5 MG/ML IJ SOLN
2.0000 mg | Freq: Once | INTRAMUSCULAR | Status: AC
  Administered 2014-01-31: 2 mg via INTRAVENOUS
  Filled 2014-01-31: qty 1

## 2014-01-31 MED ORDER — NICARDIPINE HCL IN NACL 20-0.86 MG/200ML-% IV SOLN
3.0000 mg/h | INTRAVENOUS | Status: DC
  Administered 2014-01-31: 5 mg/h via INTRAVENOUS
  Filled 2014-01-31: qty 200

## 2014-01-31 NOTE — ED Provider Notes (Signed)
CSN: [id number]     Arrival date & time 01/31/14  1737 History   First MD Initiated Contact with Patient 01/31/14 1748     Chief Complaint  Patient presents with  . Code Stroke     (Consider location/radiation/quality/duration/timing/severity/associated sxs/prior Treatment) HPI  73 year old male presents with acute altered mental status and confusion. Last seen normal at 12 PM by daughter. Daughter is providing history as the patient is not following commands and appears agitated. He apparently saw her around 89 and was normal. He was supposed to bring her lunch but she did not find him until around 3 PM and he was confused and sitting in a van. Patient is had similar symptoms to this when these had a stroke in the past. She acknowledges that he does not know who she is which is an acute and sudden change. She has not noticed any focal weakness.  Past Medical History  Diagnosis Date  . Hx of transient ischemic attack (TIA)   . Hyperlipidemia   . ED (erectile dysfunction)   . Personal history of kidney stones   . Subarachnoid hemorrhage     '11- hospitalized -3 to 4 days(passed out-awoke in hospital)- no residual affects.  . Myocardial infarction     12/1991  . Dysphagia   . Hypokalemia   . Hypothyroidism   . Sleep apnea     on CPAP-settings 6  . History of kidney stones 02-27-13    x5- none recent  . Throat cancer     '00-radiation therapy  . Stroke   . Unspecified cerebral artery occlusion with cerebral infarction 11/08/2012  . Episodic confusion 02-27-13    hx. of, not currently(multiple episodes- ? TIA's)-always saw MD to be evaluated.  Marland Kitchen AKI (acute kidney injury) 02/23/2011    pt. denies any problems 02-27-13  . Hypertension     hx. labile - with spikes every 3-6 mos.-last 8'14 ED visit.  Marland Kitchen Hypertensive encephalopathy   . Headache(784.0)     episodic with periods of confusion -multiple  . Neuromuscular disorder     "parkinson's" -no tremor-sees neurology MD at [facility]   . Coronary artery disease     Dr. Martinique   Past Surgical History  Procedure Laterality Date  . Angioplasty  1993    LAD  . Cardiac catheterization    . Coronary angioplasty    . Basal cell cancer      head  . Hernia repair    . Appendectomy    . Patent foramen ovale closure  2008  . Eye surgery    . Colonoscopy N/A 06/18/2012    Procedure: COLONOSCOPY;  Surgeon: Jeryl Columbia, MD;  Location: [facility] ENDOSCOPY;  Service: Endoscopy;  Laterality: N/A;  h&p in file-[place]   . Hot hemostasis N/A 06/18/2012    Procedure: HOT HEMOSTASIS (ARGON PLASMA COAGULATION/BICAP);  Surgeon: Jeryl Columbia, MD;  Location: [facility] ENDOSCOPY;  Service: Endoscopy;  Laterality: N/A;  . Colonoscopy with propofol N/A 03/07/2013    Procedure: COLONOSCOPY WITH PROPOFOL;  Surgeon: Jeryl Columbia, MD;  Location: [facility] ENDOSCOPY;  Service: Endoscopy;  Laterality: N/A;   Family History  Problem Relation Age of Onset  . Heart failure Father   . Heart attack Brother   . Anemia Brother 69    mi   History  Substance Use Topics  . Smoking status: Former Smoker    Types: Cigarettes    Quit date: 09/21/1990  . Smokeless tobacco: Never Used  . Alcohol Use: No  Review of Systems  Unable to perform ROS: Mental status change      Allergies  Review of patient's allergies indicates no known allergies.  Home Medications   Prior to Admission medications   Medication Sig Start Date End Date Taking? Authorizing Provider  aspirin 325 MG tablet Take 325 mg by mouth daily.      Historical Provider, MD  carbidopa-levodopa (SINEMET IR) 25-100 MG per tablet Take 1 tablet by mouth 3 (three) times daily.    Historical Provider, MD  cloNIDine (CATAPRES) 0.1 MG tablet Take 0.5 tablet by mouth as needed for blood pressure than 160. 11/06/13   Peter M Martinique, MD  fish oil-omega-3 fatty acids 1000 MG capsule Take 1 g by mouth daily.     Historical Provider, MD  GLUCOSAMINE HCL PO Take 1 tablet by mouth daily.    Historical Provider, MD   labetalol (NORMODYNE) 200 MG tablet Take 1 tablet (200 mg total) by mouth 2 (two) times daily. 11/19/12   Monika Salk, MD  levothyroxine (SYNTHROID, LEVOTHROID) 100 MCG tablet Take 100 mcg by mouth at bedtime.     Historical Provider, MD  lovastatin (MEVACOR) 40 MG tablet Take 80 mg by mouth at bedtime.     Historical Provider, MD  Multiple Vitamin (MULITIVITAMIN WITH MINERALS) TABS Take 1 tablet by mouth daily.      Historical Provider, MD  testosterone cypionate (DEPOTESTOTERONE CYPIONATE) 200 MG/ML injection Inject 1 mL (200 mg total) into the muscle every 28 (twenty-eight) days. 11/20/12   Blanchie Serve, MD   There were no vitals taken for this visit. Physical Exam  Constitutional: He appears well-developed and well-nourished.  HENT:  Head: Normocephalic and atraumatic.  Right Ear: External ear normal.  Left Ear: External ear normal.  Nose: Nose normal.  Eyes: Right eye exhibits no discharge. Left eye exhibits no discharge.  Neck: Neck supple.  Cardiovascular: Normal rate, regular rhythm, normal heart sounds and intact distal pulses.   Pulmonary/Chest: Effort normal.  Abdominal: Soft. There is no tenderness.  Musculoskeletal: He exhibits no edema.  Neurological: He is alert. He is disoriented. GCS eye subscore is 4. GCS verbal subscore is 3. GCS motor subscore is 5.  Patient is awake and alert but does not follow commands. Grossly moves all 4 extremities while moving around in bed  Skin: Skin is warm and dry. He is not diaphoretic.  Psychiatric: He is agitated.  Nursing note and vitals reviewed.   ED Course  Procedures (including critical care time) Labs Review Labs Reviewed  CBC - Abnormal; Notable for the following:    MCV 100.2 (*)    All other components within normal limits  DIFFERENTIAL - Abnormal; Notable for the following:    Neutrophils Relative % 84 (*)    Neutro Abs 8.3 (*)    Lymphocytes Relative 9 (*)    All other components within normal limits  COMPREHENSIVE  METABOLIC PANEL - Abnormal; Notable for the following:    Glucose, Bld 129 (*)    BUN 25 (*)    GFR calc non Af Amer 64 (*)    GFR calc Af Amer 74 (*)    All other components within normal limits  BLOOD GAS, ARTERIAL - Abnormal; Notable for the following:    pH, Arterial 7.455 (*)    pO2, Arterial 141.0 (*)    Bicarbonate 24.4 (*)    All other components within normal limits  I-STAT CHEM 8, ED - Abnormal; Notable for the following:  BUN 26 (*)    Glucose, Bld 132 (*)    All other components within normal limits  CBG MONITORING, ED - Abnormal; Notable for the following:    Glucose-Capillary 129 (*)    All other components within normal limits  MRSA PCR SCREENING  ETHANOL  PROTIME-INR  APTT  TROPONIN I  AMMONIA  URINE RAPID DRUG SCREEN (HOSP PERFORMED)  URINALYSIS, ROUTINE W REFLEX MICROSCOPIC  HEMOGLOBIN A1C  LIPID PANEL  TROPONIN I  TROPONIN I  URINALYSIS, ROUTINE W REFLEX MICROSCOPIC  BLOOD GAS, ARTERIAL  I-STAT TROPOININ, ED  I-STAT TROPOININ, ED    Imaging Review Ct Head Wo Contrast  01/31/2014   CLINICAL DATA:  Code stroke, history of old hemorrhagic stroke, altered mental status currently, initial evaluation  EXAM: CT HEAD WITHOUT CONTRAST  TECHNIQUE: Contiguous axial images were obtained from the base of the skull through the vertex without intravenous contrast.  COMPARISON:  11/12/2012, 11/11/2012  FINDINGS: Severe diffuse atrophy. Moderate low attenuation in the deep white matter. No hydrocephalus. No hemorrhage or extra-axial fluid. No acute vascular territory infarct. It is retention cyst left maxillary sinus not significantly different from prior study. Calvarium intact.  IMPRESSION: No acute intracranial abnormalities. Critical Value/emergent results were called by telephone at the time of interpretation on 01/31/2014 at 6:23 pm to Dr. Doy Mince, who verbally acknowledged these results.   Electronically Signed   By: Skipper Cliche M.D.   On: 01/31/2014 18:24    Dg Chest Port 1 View  01/31/2014   CLINICAL DATA:  Code stroke. History of hypertension, coronary artery disease, subarachnoid hemorrhage  EXAM: PORTABLE CHEST - 1 VIEW  COMPARISON:  11/14/2012  FINDINGS: There are bilateral chronic thickened interstitial markings. There are sudden increased interstitial markings at the left lung base. There is no other focal parenchymal opacity, pleural effusion, or pneumothorax. The heart and mediastinal contours are unremarkable.  The osseous structures are unremarkable.  IMPRESSION: Bilateral chronic interstitial thickening. Slightly increased interstitial disease of the left lung base concerning for superimposed acute infectious or inflammatory etiology.   Electronically Signed   By: Kathreen Devoid   On: 01/31/2014 21:19     EKG Interpretation   Date/Time:  Saturday January 31 2014 17:56:21 EST Ventricular Rate:  87 PR Interval:  188 QRS Duration: 101 QT Interval:  366 QTC Calculation: 440 R Axis:   82 Text Interpretation:  Sinus rhythm Borderline right axis deviation  Baseline wander in lead(s) V1 V6 No significant change since last tracing  Confirmed by Kirill Chatterjee  MD, Avianah Pellman ([id number]) on 01/31/2014 7:22:15 PM      CRITICAL CARE Performed by: Sherwood Gambler T   Total critical care time: 30 minutes  Critical care time was exclusive of separately billable procedures and treating other patients.  Critical care was necessary to treat or prevent imminent or life-threatening deterioration.  Critical care was time spent personally by me on the following activities: development of treatment plan with patient and/or surrogate as well as nursing, discussions with consultants, evaluation of patient's response to treatment, examination of patient, obtaining history from patient or surrogate, ordering and performing treatments and interventions, ordering and review of laboratory studies, ordering and review of radiographic studies, pulse oximetry and  re-evaluation of patient's condition.  MDM   Final diagnoses:  Altered mental status    5:50 PM Code stroke called from triage. Patient grossly moves all extremities but does not follow commands. Dr. Doy Mince is at bedside and has given 2 mg Ativan and 10 mg labetalol  IV and will sent to CT scanner. Outside of TPA window.  Patient's agitation did significantly improve with Ativan. He did start to become more agitated and was given low-dose Haldol as well. He has remained quite hypertensive and given a negative CT there is concern that this is a hypertensive encephalopathy. Not much blood pressure control with labetalol and hydralazine. Given this he was started on a Cardene drip and will be admitted to the ICU for close monitoring. Patient is afebrile with a normal white blood cell count. Given this happen before I have low suspicion this is an acute meningitis. At this point patient will be admitted to the ICU.   Ephraim Hamburger, MD 01/31/14 [phone]

## 2014-01-31 NOTE — ED Notes (Signed)
Attempted report 

## 2014-01-31 NOTE — ED Notes (Signed)
Stroke Team at the bedside.

## 2014-01-31 NOTE — ED Notes (Signed)
Pt hypertensive and still combative & hypertensive. Dr. Doy Mince requesting 10mg  IV labetalol & 2mg  more of IV ativan for aggitation.

## 2014-01-31 NOTE — Consult Note (Signed)
PULMONARY / CRITICAL CARE MEDICINE   Name: Benjamin Park MRN: [id number] DOB: [birth year]    ADMISSION DATE:  01/31/2014 CONSULTATION DATE:  01/31/14  REFERRING MD :  TRH  CHIEF COMPLAINT:  Hypertensive emergency  INITIAL PRESENTATION: Pt with hypertensive encephalopathy   STUDIES:  Head CT - no acute findings CXR - no acute infiltrate  SIGNIFICANT EVENTS: Started on cardene gtt 01/31/14   HISTORY OF PRESENT ILLNESS:  73 y/o man with extensive past medical history listed below. Has a history of hypertensive encephalopathy with rapid swings in blood pressure but often hypotensive with medication and therefore very difficult to treat.  He had an episode of confusion yesterday that resolved after a couple of hours.  Today his wife was trying to reach him starting around 2pm and could not get in touch with him.  She sent the patient's son to check on him around 4pm and he found him in his [employer] Lei in his driveway, confused and frightened.  They brought him to the ED where he was found to be hypertensive but with no acute findings on head CT.  He was given labetolol and hydralizine to try to control his BP which was not successful and was therefore started on a cardene gtt and admitted to the ICU.  When I saw him his BP was 258 systolic with the cardene drip at 5.    In the ED prior to admission he was apparently combative and was given ativan and haldol after which he became more somnolent.   PAST MEDICAL HISTORY :   has a past medical history of transient ischemic attack (TIA); Hyperlipidemia; ED (erectile dysfunction); Personal history of kidney stones; Subarachnoid hemorrhage; Myocardial infarction; Dysphagia; Hypokalemia; Hypothyroidism; Sleep apnea; History of kidney stones (02-27-13); Throat cancer; Stroke; Unspecified cerebral artery occlusion with cerebral infarction (11/08/2012); Episodic confusion (02-27-13); AKI (acute kidney injury) (02/23/2011); Hypertension; Hypertensive  encephalopathy; Headache(784.0); Neuromuscular disorder; and Coronary artery disease.  has past surgical history that includes Angioplasty (1993); Cardiac catheterization; Coronary angioplasty; basal cell cancer; Hernia repair; Appendectomy; Patent foramen ovale closure (2008); Eye surgery; Colonoscopy (N/A, 06/18/2012); Hot hemostasis (N/A, 06/18/2012); and Colonoscopy with propofol (N/A, 03/07/2013). Prior to Admission medications   Medication Sig Start Date End Date Taking? Authorizing Provider  aspirin 325 MG tablet Take 325 mg by mouth daily.     Yes Historical Provider, MD  cloNIDine (CATAPRES) 0.1 MG tablet Take 0.5 tablet by mouth as needed for blood pressure than 160. 11/06/13  Yes Peter M Martinique, MD  fish oil-omega-3 fatty acids 1000 MG capsule Take 1 g by mouth daily.    Yes Historical Provider, MD  GLUCOSAMINE HCL PO Take 1 tablet by mouth daily.   Yes Historical Provider, MD  labetalol (NORMODYNE) 200 MG tablet Take 1 tablet (200 mg total) by mouth 2 (two) times daily. 11/19/12  Yes Monika Salk, MD  levothyroxine (SYNTHROID, LEVOTHROID) 100 MCG tablet Take 100 mcg by mouth at bedtime.    Yes Historical Provider, MD  lovastatin (MEVACOR) 40 MG tablet Take 80 mg by mouth at bedtime.    Yes Historical Provider, MD  Multiple Vitamin (MULITIVITAMIN WITH MINERALS) TABS Take 1 tablet by mouth daily.     Yes Historical Provider, MD  testosterone cypionate (DEPOTESTOTERONE CYPIONATE) 200 MG/ML injection Inject 1 mL (200 mg total) into the muscle every 28 (twenty-eight) days. 11/20/12  Yes Blanchie Serve, MD   No Known Allergies  FAMILY HISTORY:  indicated that his mother is deceased. He indicated that his  father is deceased. He indicated that his sister is deceased. He indicated that his brother is deceased.  SOCIAL HISTORY:  reports that he quit smoking about 23 years ago. His smoking use included Cigarettes. He smoked 0.00 packs per day. He has never used smokeless tobacco. He reports that he does not  drink alcohol or use illicit drugs.  REVIEW OF SYSTEMS:  Could not be obtained as pt was somnolent and not able to answer questions.   SUBJECTIVE:   VITAL SIGNS: Temp:  [97.4 F (36.3 C)-99 F (37.2 C)] 98.8 F (37.1 C) (11/14 2151) Pulse Rate:  [79-110] 110 (11/14 2151) Resp:  [14-26] 20 (11/14 2115) BP: (146-194)/(77-113) 146/77 mmHg (11/14 2151) SpO2:  [90 %-100 %] 100 % (11/14 2151) Weight:  [71.1 kg (156 lb 12 oz)] 71.1 kg (156 lb 12 oz) (11/14 2129) HEMODYNAMICS:   VENTILATOR SETTINGS:   INTAKE / OUTPUT:  Intake/Output Summary (Last 24 hours) at 01/31/14 2242 Last data filed at 01/31/14 2200  Gross per 24 hour  Intake     50 ml  Output      0 ml  Net     50 ml    PHYSICAL EXAMINATION: General:  Laying in bed, no acute distress Neuro:  Does not awaken to voice, or physical stimuli, Saw flexion of both arms and left leg, did not see flexion of right leg.  Did see fasciculations in calf on right. HEENT:  PERRL, EOMI, OP clear, Dentures in place Cardiovascular:   NRRR, no MRG Lungs:  CTAB, no wrr Abdomen:  Soft, NTND, +BS, no HSM Musculoskeletal:  No joint abnormalities Skin:  No bruises, rashes or other lesions.   LABS:  CBC  Recent Labs Lab 01/31/14 1749 01/31/14 1808  WBC 9.9  --   HGB 15.5 16.3  HCT 46.0 48.0  PLT 171  --    Coag's  Recent Labs Lab 01/31/14 1749  APTT 35  INR 1.08   BMET  Recent Labs Lab 01/31/14 1749 01/31/14 1808  NA 142 143  K 4.4 4.2  CL 101 103  CO2 28  --   BUN 25* 26*  CREATININE 1.12 1.20  GLUCOSE 129* 132*   Electrolytes  Recent Labs Lab 01/31/14 1749  CALCIUM 9.6   Sepsis Markers No results for input(s): LATICACIDVEN, PROCALCITON, O2SATVEN in the last 168 hours. ABG  Recent Labs Lab 01/31/14 2215  PHART 7.455*  PCO2ART 35.2  PO2ART 141.0*   Liver Enzymes  Recent Labs Lab 01/31/14 1749  AST 25  ALT 14  ALKPHOS 72  BILITOT 0.4  ALBUMIN 3.9   Cardiac Enzymes No results for input(s):  TROPONINI, PROBNP in the last 168 hours. Glucose  Recent Labs Lab 01/31/14 1751  GLUCAP 129*    Imaging No results found.   ASSESSMENT / PLAN:  PULMONARY A:h/o of OSA P:   Wears CPAP at home.  Will hold of on CPAP given history of emesis with these episodes in the past.  O2 to maintain sats >88%. ABG done and no hypercarbia.   CARDIOVASCULAR A: HTN P:  Wean cardene drip to maintain SBP ~150  RENAL A:  H/x of hypokalemia P:   Potassium normal on admission Will follow  GASTROINTESTINAL A:  Hx of aspiration  P:   Per cardiology note does not use his thick-it as prescribed.  Should likely have repeat MBSS prior to PO intake.     NEUROLOGIC A:  Hypertensive Encephalopathy vs. TIA vs. Ischemic stroke P:   RASS  goal: 0 Management per neurology   FAMILY  - Updates: Spoke with wife on admission and updated to pt condition and plan.   - Inter-disciplinary family meet or Palliative Care meeting due by:  11/21    TODAY'S SUMMARY: 73 y/o admitted with hypertensive encephalopathy, required cardene gtt for BP control.        Pulmonary and [place] Pager: [phone]  01/31/2014, 10:42 PM

## 2014-01-31 NOTE — Progress Notes (Signed)
[place] Progress Note Patient Name: Benjamin Park DOB: 06/26/1940 MRN: [id number]   Date of Service  01/31/2014  HPI/Events of Note   Received call from [facility] team. Patient with [facility] emergency/urgency not responding to IV meds and unable to take po 2/2 AMS.   eICU Interventions   TRH to start nicardipine drip Bedside MD to admit      Intervention Category Minor Interventions: Communication with other healthcare providers and/or family  Alishah Schulte R. 01/31/2014, 9:00 PM

## 2014-01-31 NOTE — ED Notes (Signed)
[facility] contacted to call Code Stroke 

## 2014-01-31 NOTE — ED Notes (Signed)
Order to not start Cardene gtt from hospitalist gotten, Hydrolyzing 10 mg IV given, pt pending to be transfer to stepdown, per MD order. Pt still very combative and confuse, family member at the bedside.

## 2014-01-31 NOTE — Consult Note (Signed)
Referring Physician: Regenia Skeeter    Chief Complaint: Confusion  HPI: Benjamin Park is an 73 y.o. male who was at his baseline when his wife left for work today.  She tried to contact him today about 1400 and he did not answer the phone.  He was to bring her lunch today and did not show.  When she could not reach him she called his som who found him confused in his car.  She and her daughter-in-law were able to coax him out of the car and he was brought here for evaluation.  She reports that he has had episodes of confusion in th past that were usually related to elevated BP.    Date last known well: Date: 01/31/2014 Time last known well: Time: 12:00 tPA Given: No: Outside time window, h/o [facility]  Past Medical History  Diagnosis Date  . Hx of transient ischemic attack (TIA)   . Hyperlipidemia   . ED (erectile dysfunction)   . Personal history of kidney stones   . Subarachnoid hemorrhage     '11- hospitalized -3 to 4 days(passed out-awoke in hospital)- no residual affects.  . Myocardial infarction     12/1991  . Dysphagia   . Hypokalemia   . Hypothyroidism   . Sleep apnea     on CPAP-settings 6  . History of kidney stones 02-27-13    x5- none recent  . Throat cancer     '00-radiation therapy  . Stroke   . Unspecified cerebral artery occlusion with cerebral infarction 11/08/2012  . Episodic confusion 02-27-13    hx. of, not currently(multiple episodes- ? TIA's)-always saw MD to be evaluated.  Marland Kitchen AKI (acute kidney injury) 02/23/2011    pt. denies any problems 02-27-13  . Hypertension     hx. labile - with spikes every 3-6 mos.-last 8'14 ED visit.  Marland Kitchen Hypertensive encephalopathy   . Headache(784.0)     episodic with periods of confusion -multiple  . Neuromuscular disorder     "parkinson's" -no tremor-sees neurology MD at [facility]  . Coronary artery disease     Dr. Martinique    Past Surgical History  Procedure Laterality Date  . Angioplasty  1993    LAD  . Cardiac catheterization    .  Coronary angioplasty    . Basal cell cancer      head  . Hernia repair    . Appendectomy    . Patent foramen ovale closure  2008  . Eye surgery    . Colonoscopy N/A 06/18/2012    Procedure: COLONOSCOPY;  Surgeon: Jeryl Columbia, MD;  Location: [facility] ENDOSCOPY;  Service: Endoscopy;  Laterality: N/A;  h&p in file-[facility]   . Hot hemostasis N/A 06/18/2012    Procedure: HOT HEMOSTASIS (ARGON PLASMA COAGULATION/BICAP);  Surgeon: Jeryl Columbia, MD;  Location: [facility] ENDOSCOPY;  Service: Endoscopy;  Laterality: N/A;  . Colonoscopy with propofol N/A 03/07/2013    Procedure: COLONOSCOPY WITH PROPOFOL;  Surgeon: Jeryl Columbia, MD;  Location: [facility] ENDOSCOPY;  Service: Endoscopy;  Laterality: N/A;    Family History  Problem Relation Age of Onset  . Heart failure Father   . Heart attack Brother   . Anemia Brother 22    mi   Social History:  reports that he quit smoking about 23 years ago. His smoking use included Cigarettes. He smoked 0.00 packs per day. He has never used smokeless tobacco. He reports that he does not drink alcohol or use illicit drugs.  Allergies: No Known  Allergies  Medications: I have reviewed the patient's current medications. Prior to Admission:  Current outpatient prescriptions: aspirin 325 MG tablet, Take 325 mg by mouth daily.  , Disp: , Rfl: ;  cloNIDine (CATAPRES) 0.1 MG tablet, Take 0.5 tablet by mouth as needed for blood pressure than 160., Disp: 30 tablet, Rfl: 6;  fish oil-omega-3 fatty acids 1000 MG capsule, Take 1 g by mouth daily. , Disp: , Rfl: ;  GLUCOSAMINE HCL PO, Take 1 tablet by mouth daily., Disp: , Rfl:  labetalol (NORMODYNE) 200 MG tablet, Take 1 tablet (200 mg total) by mouth 2 (two) times daily., Disp: 60 tablet, Rfl: 0;  levothyroxine (SYNTHROID, LEVOTHROID) 100 MCG tablet, Take 100 mcg by mouth at bedtime. , Disp: , Rfl: ;  lovastatin (MEVACOR) 40 MG tablet, Take 80 mg by mouth at bedtime. , Disp: , Rfl: ;  Multiple Vitamin (MULITIVITAMIN WITH MINERALS) TABS, Take 1 tablet  by mouth daily.  , Disp: , Rfl:  testosterone cypionate (DEPOTESTOTERONE CYPIONATE) 200 MG/ML injection, Inject 1 mL (200 mg total) into the muscle every 28 (twenty-eight) days., Disp: 10 mL, Rfl: 5  ROS: Unable to provide  Physical Examination: Blood pressure 184/113, pulse 79, temperature 97.4 F (36.3 C), temperature source Axillary, resp. rate 17, SpO2 95 %.  Neurologic Examination: Mental Status: Alert.  Expressive aphasia.  The only word able to be understood is his wife's name.  Does not follow commands.  Agitated.  Combative. Cranial Nerves: II: Discs flat bilaterally; Blinks to bilateral confrontation, pupils equal, round, reactive to light and accommodation III,IV, VI: ptosis not present, extra-ocular motions grossly intact bilaterally V,VII: smile symmetric VIII: hearing unable to be tested IX,X: gag reflex unable to be tested XI: bilateral shoulder shrug XII: tongue extension unable to be tested Motor: Moves all extremities strongly.  No focal weakness noted.   Sensory: Responds to noxious stimuli throughout Deep Tendon Reflexes: Unable to test Plantars: Right: downgoing   Left: downgoing Cerebellar: Unable to test formally but has no difficulty reaching for objects Gait: Unable to test safely.   CV: pulses palpable throughout     Laboratory Studies:  Basic Metabolic Panel:  Recent Labs Lab 01/31/14 1749 01/31/14 1808  NA 142 143  K 4.4 4.2  CL 101 103  CO2 28  --   GLUCOSE 129* 132*  BUN 25* 26*  CREATININE 1.12 1.20  CALCIUM 9.6  --     Liver Function Tests:  Recent Labs Lab 01/31/14 1749  AST 25  ALT 14  ALKPHOS 72  BILITOT 0.4  PROT 7.4  ALBUMIN 3.9   No results for input(s): LIPASE, AMYLASE in the last 168 hours. No results for input(s): AMMONIA in the last 168 hours.  CBC:  Recent Labs Lab 01/31/14 1749 01/31/14 1808  WBC 9.9  --   NEUTROABS 8.3*  --   HGB 15.5 16.3  HCT 46.0 48.0  MCV 100.2*  --   PLT 171  --      Cardiac Enzymes: No results for input(s): CKTOTAL, CKMB, CKMBINDEX, TROPONINI in the last 168 hours.  BNP: Invalid input(s): POCBNP  CBG:  Recent Labs Lab 01/31/14 1751  GLUCAP 129*    Microbiology: Results for orders placed or performed during the hospital encounter of 12/22/13  Urine culture     Status: None   Collection Time: 12/22/13 10:17 PM  Result Value Ref Range Status   Specimen Description URINE, RANDOM  Final   Special Requests NONE  Final   Culture  Setup Time   Final    12/23/2013 09:00 Performed at [place]   Final    20,OOO COLONIES/ML Performed at [facility]   Culture   Final    STAPHYLOCOCCUS SPECIES (COAGULASE NEGATIVE) Note: RIFAMPIN AND GENTAMICIN SHOULD NOT BE USED AS SINGLE DRUGS FOR TREATMENT OF STAPH INFECTIONS. Performed at [facility]   Report Status 12/26/2013 FINAL  Final   Organism ID, Bacteria STAPHYLOCOCCUS SPECIES (COAGULASE NEGATIVE)  Final      Susceptibility   Staphylococcus species (coagulase negative) - MIC*    GENTAMICIN <=0.5 SENSITIVE Sensitive     LEVOFLOXACIN <=0.12 SENSITIVE Sensitive     NITROFURANTOIN <=16 SENSITIVE Sensitive     OXACILLIN <=0.25 SENSITIVE Sensitive     PENICILLIN <=0.03 SENSITIVE Sensitive     RIFAMPIN <=0.5 SENSITIVE Sensitive     TRIMETH/SULFA <=10 SENSITIVE Sensitive     VANCOMYCIN 1 SENSITIVE Sensitive     TETRACYCLINE <=1 SENSITIVE Sensitive     * STAPHYLOCOCCUS SPECIES (COAGULASE NEGATIVE)    Coagulation Studies: No results for input(s): LABPROT, INR in the last 72 hours.  Urinalysis: No results for input(s): COLORURINE, LABSPEC, PHURINE, GLUCOSEU, HGBUR, BILIRUBINUR, KETONESUR, PROTEINUR, UROBILINOGEN, NITRITE, LEUKOCYTESUR in the last 168 hours.  Invalid input(s): APPERANCEUR  Lipid Panel:    Component Value Date/Time   CHOL 155 11/12/2012 0410   TRIG 104 11/12/2012 0410   HDL 35* 11/12/2012 0410   CHOLHDL 4.4 11/12/2012 0410   VLDL 21  11/12/2012 0410   LDLCALC 99 11/12/2012 0410    HgbA1C:  Lab Results  Component Value Date   HGBA1C 5.5 11/12/2012    Urine Drug Screen:     Component Value Date/Time   LABOPIA NONE DETECTED 11/11/2012 1708   LABOPIA NEGATIVE 02/24/2011 1007   COCAINSCRNUR NONE DETECTED 11/11/2012 1708   COCAINSCRNUR NEGATIVE 02/24/2011 1007   LABBENZ NONE DETECTED 11/11/2012 1708   LABBENZ NEGATIVE 02/24/2011 1007   AMPHETMU NONE DETECTED 11/11/2012 1708   AMPHETMU NEGATIVE 02/24/2011 1007   THCU NONE DETECTED 11/11/2012 1708   LABBARB NONE DETECTED 11/11/2012 1708    Alcohol Level:  Recent Labs Lab 01/31/14 [place] <11    Other results: EKG: normal sinus rhythm at 87 bpm.  Imaging: Ct Head Wo Contrast  01/31/2014   CLINICAL DATA:  Code stroke, history of old hemorrhagic stroke, altered mental status currently, initial evaluation  EXAM: CT HEAD WITHOUT CONTRAST  TECHNIQUE: Contiguous axial images were obtained from the base of the skull through the vertex without intravenous contrast.  COMPARISON:  11/12/2012, 11/11/2012  FINDINGS: Severe diffuse atrophy. Moderate low attenuation in the deep white matter. No hydrocephalus. No hemorrhage or extra-axial fluid. No acute vascular territory infarct. It is retention cyst left maxillary sinus not significantly different from prior study. Calvarium intact.  IMPRESSION: No acute intracranial abnormalities. Critical Value/emergent results were called by telephone at the time of interpretation on 01/31/2014 at 6:23 pm to Dr. Doy Mince, who verbally acknowledged these results.   Electronically Signed   By: Skipper Cliche M.D.   On: 01/31/2014 18:24    Assessment: 73 y.o. male presenting with acute mental status changes.  BP elevated.  No other focality noted on neurological examination.  Head CT reviewed and shows no acute changes.  Although can no rule out the possibility of a branch occlusion causing an isolated aphasia, can not rule out hypertensive  encephalopathy as well.  Patient outside time window for tPA and exam not consistent  with a large vessel event.    Stroke Risk Factors - hyperlipidemia and hypertension  Plan: 1. HgbA1c, fasting lipid panel 2. MRI, MRA  of the brain without contrast 3. PT consult, OT consult, Speech consult 4. Echocardiogram 5. Carotid dopplers 6. Prophylactic therapy-Antiplatelet med: Aspirin - dose 325mg  daily or 300mg  rectally 7. NPO until RN stroke swallow screen 8. Telemetry monitoring 9. Frequent neuro checks 10.  BP control  Case discussed with Dr. Franchot Erichsen, MD [facility] [phone] 01/31/2014, 6:45 PM

## 2014-01-31 NOTE — H&P (Addendum)
[facility]                                                                                    Patient Demographics  Benjamin Park, is a 73 y.o. male  CSN: [id number]  MRN: [id number]  DOB - [birth year]  Admit Date - 01/31/2014  Outpatient Primary MD for the patient is ROBERTS, [name], MD   With History of -  Past Medical History  Diagnosis Date  . Hx of transient ischemic attack (TIA)   . Hyperlipidemia   . ED (erectile dysfunction)   . Personal history of kidney stones   . Subarachnoid hemorrhage     '11- hospitalized -3 to 4 days(passed out-awoke in hospital)- no residual affects.  . Myocardial infarction     12/1991  . Dysphagia   . Hypokalemia   . Hypothyroidism   . Sleep apnea     on CPAP-settings 6  . History of kidney stones 02-27-13    x5- none recent  . Throat cancer     '00-radiation therapy  . Stroke   . Unspecified cerebral artery occlusion with cerebral infarction 11/08/2012  . Episodic confusion 02-27-13    hx. of, not currently(multiple episodes- ? TIA's)-always saw MD to be evaluated.  Marland Kitchen AKI (acute kidney injury) 02/23/2011    pt. denies any problems 02-27-13  . Hypertension     hx. labile - with spikes every 3-6 mos.-last 8'14 ED visit.  Marland Kitchen Hypertensive encephalopathy   . Headache(784.0)     episodic with periods of confusion -multiple  . Neuromuscular disorder     "parkinson's" -no tremor-sees neurology MD at [facility]  . Coronary artery disease     Dr. Martinique      Past Surgical History  Procedure Laterality Date  . Angioplasty  1993    LAD  . Cardiac catheterization    . Coronary angioplasty    . Basal cell cancer      head  . Hernia repair    . Appendectomy    . Patent foramen ovale closure  2008  . Eye surgery    . Colonoscopy N/A 06/18/2012    Procedure: COLONOSCOPY;  Surgeon: Jeryl Columbia, MD;  Location: [facility] ENDOSCOPY;  Service: Endoscopy;  Laterality: N/A;  h&p in file-[facility]   . Hot hemostasis N/A 06/18/2012     Procedure: HOT HEMOSTASIS (ARGON PLASMA COAGULATION/BICAP);  Surgeon: Jeryl Columbia, MD;  Location: [facility] ENDOSCOPY;  Service: Endoscopy;  Laterality: N/A;  . Colonoscopy with propofol N/A 03/07/2013    Procedure: COLONOSCOPY WITH PROPOFOL;  Surgeon: Jeryl Columbia, MD;  Location: [facility] ENDOSCOPY;  Service: Endoscopy;  Laterality: N/A;    in for   Chief Complaint  Patient presents with  . Code Stroke     HPI  Benjamin Park  is a 73 y.o. male, past medical history significant for hypertension, subarachnoid hemorrhage in the past, TIA and episodes of confusion who was brought by his family today for evaluation after he was found unresponsive in his car. His wife was waiting for him to bring her food today, and he was late so she called his son who came and found him  unresponsive. The wife reports episodes of confusion lately and a similar episode a few years ago that she relates to elevated blood pressure. Patient basically unresponsive and he received Ativan in the emergency room prior to me seeing him so I could not get any history from him. No history of trauma    Review of Systems    Unable to obtain due to patient's condition  Social History History  Substance Use Topics  . Smoking status: Former Smoker    Types: Cigarettes    Quit date: 09/21/1990  . Smokeless tobacco: Never Used  . Alcohol Use: No     Family History Family History  Problem Relation Age of Onset  . Heart failure Father   . Heart attack Brother   . Anemia Brother 33    mi     Prior to Admission medications   Medication Sig Start Date End Date Taking? Authorizing Provider  aspirin 325 MG tablet Take 325 mg by mouth daily.     Yes Historical Provider, MD  cloNIDine (CATAPRES) 0.1 MG tablet Take 0.5 tablet by mouth as needed for blood pressure than 160. 11/06/13  Yes Peter M Martinique, MD  fish oil-omega-3 fatty acids 1000 MG capsule Take 1 g by mouth daily.    Yes Historical Provider, MD  GLUCOSAMINE HCL PO Take 1  tablet by mouth daily.   Yes Historical Provider, MD  labetalol (NORMODYNE) 200 MG tablet Take 1 tablet (200 mg total) by mouth 2 (two) times daily. 11/19/12  Yes Monika Salk, MD  levothyroxine (SYNTHROID, LEVOTHROID) 100 MCG tablet Take 100 mcg by mouth at bedtime.    Yes Historical Provider, MD  lovastatin (MEVACOR) 40 MG tablet Take 80 mg by mouth at bedtime.    Yes Historical Provider, MD  Multiple Vitamin (MULITIVITAMIN WITH MINERALS) TABS Take 1 tablet by mouth daily.     Yes Historical Provider, MD  testosterone cypionate (DEPOTESTOTERONE CYPIONATE) 200 MG/ML injection Inject 1 mL (200 mg total) into the muscle every 28 (twenty-eight) days. 11/20/12  Yes Mahima Bubba Camp, MD    No Known Allergies  Physical Exam  Vitals  Blood pressure 167/93, pulse 86, temperature 99 F (37.2 C), temperature source Oral, resp. rate 19, SpO2 100 %.   1. General elderly patient, well-nourished and well-developed  2. obtunded.  3. No gross F.N deficits, but unable to accurately examine due to his altered mental status and confusion.  4. Ears and Eyes appear Normal, Conjunctivae clear, unable to open his eyes. Moist Oral Mucosa.  5. Supple Neck, No JVD, No cervical lymphadenopathy appriciated, No Carotid Bruits.  6. Symmetrical Chest wall movement, Good air movement bilaterally, CTAB.  7. RRR, No Gallops, Rubs or Murmurs, No Parasternal Heave.  8. Positive Bowel Sounds, Abdomen Soft, Non tender, No organomegaly appriciated,No rebound -guarding or rigidity.  9.  No Cyanosis, Normal Skin Turgor, No Skin Rash or Bruise.  10. Good muscle tone,  joints appear normal , no effusions, Normal ROM.  11. No Palpable Lymph Nodes in Neck or Axillae    Data Review  CBC  Recent Labs Lab 01/31/14 1749 01/31/14 1808  WBC 9.9  --   HGB 15.5 16.3  HCT 46.0 48.0  PLT 171  --   MCV 100.2*  --   MCH 33.8  --   MCHC 33.7  --   RDW 13.8  --   LYMPHSABS 0.9  --   MONOABS 0.4  --   EOSABS 0.3  --  BASOSABS 0.0  --    ------------------------------------------------------------------------------------------------------------------  Chemistries   Recent Labs Lab 01/31/14 1749 01/31/14 1808  NA 142 143  K 4.4 4.2  CL 101 103  CO2 28  --   GLUCOSE 129* 132*  BUN 25* 26*  CREATININE 1.12 1.20  CALCIUM 9.6  --   AST 25  --   ALT 14  --   ALKPHOS 72  --   BILITOT 0.4  --    ------------------------------------------------------------------------------------------------------------------ CrCl cannot be calculated (Unknown ideal weight.). ------------------------------------------------------------------------------------------------------------------ No results for input(s): TSH, T4TOTAL, T3FREE, THYROIDAB in the last 72 hours.  Invalid input(s): FREET3   Coagulation profile  Recent Labs Lab 01/31/14 1749  INR 1.08   ------------------------------------------------------------------------------------------------------------------- No results for input(s): DDIMER in the last 72 hours. -------------------------------------------------------------------------------------------------------------------  Cardiac Enzymes No results for input(s): CKMB, TROPONINI, MYOGLOBIN in the last 168 hours.  Invalid input(s): CK ------------------------------------------------------------------------------------------------------------------ Invalid input(s): POCBNP   ---------------------------------------------------------------------------------------------------------------  Urinalysis    Component Value Date/Time   COLORURINE YELLOW 12/22/2013 2217   APPEARANCEUR CLEAR 12/22/2013 2217   LABSPEC 1.020 12/22/2013 2217   PHURINE 6.0 12/22/2013 2217   GLUCOSEU NEGATIVE 12/22/2013 2217   HGBUR NEGATIVE 12/22/2013 2217   [place] NEGATIVE 12/22/2013 2217   KETONESUR 15* 12/22/2013 2217   PROTEINUR NEGATIVE 12/22/2013 2217   UROBILINOGEN 0.2 12/22/2013 2217   NITRITE  NEGATIVE 12/22/2013 2217   LEUKOCYTESUR NEGATIVE 12/22/2013 2217    ----------------------------------------------------------------------------------------------------------------   Imaging results:   Ct Head Wo Contrast  01/31/2014   CLINICAL DATA:  Code stroke, history of old hemorrhagic stroke, altered mental status currently, initial evaluation  EXAM: CT HEAD WITHOUT CONTRAST  TECHNIQUE: Contiguous axial images were obtained from the base of the skull through the vertex without intravenous contrast.  COMPARISON:  11/12/2012, 11/11/2012  FINDINGS: Severe diffuse atrophy. Moderate low attenuation in the deep white matter. No hydrocephalus. No hemorrhage or extra-axial fluid. No acute vascular territory infarct. It is retention cyst left maxillary sinus not significantly different from prior study. Calvarium intact.  IMPRESSION: No acute intracranial abnormalities. Critical Value/emergent results were called by telephone at the time of interpretation on 01/31/2014 at 6:23 pm to Dr. Doy Mince, who verbally acknowledged these results.   Electronically Signed   By: Skipper Cliche M.D.   On: 01/31/2014 18:24    My personal review of EKG: normal sinus rhythm at 87 bpm with right axis deviation    Assessment & Plan  1. Altered mental status; according to the family this has been recurrent and he was intubated before when this happened.     History of TIA 2. Emergent hypertension   Responded to IV labetalol and hydralazine; monitor 3. History of CVA and subarachnoid hemorrhage 4.history of coronary artery disease status post myocardial infarction in 1993 5. History of sleep apnea on CPAP 6. History of throat cancer status post radiation therapy  Plan  Check ABGs Check urinalysis Check ammonia level Serial troponins Neurochecks Neurology following BiPAP when necessary Check MRI, MRA and carotid Dopplers Aspirin Nothing by mouth for now IV fluids D5 half-normal saline  continue  when necessary Labetolol and hydralazine   DVT Prophylaxis Lovenox  AM Labs Ordered, also please review Full Orders  Family Communication: Admission, patients condition and plan of care including tests being ordered have been discussed with the wife who indicates understanding and agrees with the plan and Code Status.  Code Status full  Disposition Plan: unknown  Time spent in minutes : 44 minutes  Condition critical  Addendum: His  blood pressure continued going up and we started him on Cardene drip and transferred to ICU.   @SIGNATURE @

## 2014-01-31 NOTE — ED Notes (Signed)
Pt to department via with wife- wife reports that he was LSN at 1200 this afternoon. States he is confused and unable to follow commands. Pt with a hx of hemorraghic stroke.

## 2014-01-31 NOTE — ED Notes (Signed)
Patient combative, attempting to climb out of bed and stand, unable to be consoled by family and staff.  Dr. Doy Mince, [name] Gauss- RR, Mendel Ryder RN and family at bedside.

## 2014-02-01 ENCOUNTER — Inpatient Hospital Stay (HOSPITAL_COMMUNITY): Payer: Medicare HMO

## 2014-02-01 DIAGNOSIS — R400 Somnolence: Secondary | ICD-10-CM

## 2014-02-01 DIAGNOSIS — G9340 Encephalopathy, unspecified: Secondary | ICD-10-CM

## 2014-02-01 DIAGNOSIS — I10 Essential (primary) hypertension: Principal | ICD-10-CM

## 2014-02-01 DIAGNOSIS — I6789 Other cerebrovascular disease: Secondary | ICD-10-CM

## 2014-02-01 DIAGNOSIS — Z8673 Personal history of transient ischemic attack (TIA), and cerebral infarction without residual deficits: Secondary | ICD-10-CM

## 2014-02-01 LAB — LIPID PANEL
CHOLESTEROL: 179 mg/dL (ref 0–200)
HDL: 44 mg/dL (ref >39)
LDL Cholesterol: 117 mg/dL — ABNORMAL HIGH (ref 0–99)
Total CHOL/HDL Ratio: 4.1 RATIO
Triglycerides: 90 mg/dL (ref <150)
VLDL: 18 mg/dL (ref 0–40)

## 2014-02-01 LAB — URINALYSIS, ROUTINE W REFLEX MICROSCOPIC
Bilirubin Urine: NEGATIVE
Glucose, UA: NEGATIVE mg/dL
KETONES UR: 40 mg/dL — AB
LEUKOCYTES UA: NEGATIVE
Nitrite: NEGATIVE
Protein, ur: 100 mg/dL — AB
Specific Gravity, Urine: 1.014 (ref 1.005–1.030)
UROBILINOGEN UA: 0.2 mg/dL (ref 0.0–1.0)
pH: 7.5 (ref 5.0–8.0)

## 2014-02-01 LAB — BASIC METABOLIC PANEL
ANION GAP: 15 (ref 5–15)
BUN: 23 mg/dL (ref 6–23)
CHLORIDE: 97 meq/L (ref 96–112)
CO2: 25 mEq/L (ref 19–32)
Calcium: 9 mg/dL (ref 8.4–10.5)
Creatinine, Ser: 1.03 mg/dL (ref 0.50–1.35)
GFR calc non Af Amer: 71 mL/min — ABNORMAL LOW (ref >90)
GFR, EST AFRICAN AMERICAN: 82 mL/min — AB (ref >90)
Glucose, Bld: 161 mg/dL — ABNORMAL HIGH (ref 70–99)
POTASSIUM: 3.3 meq/L — AB (ref 3.7–5.3)
SODIUM: 137 meq/L (ref 137–147)

## 2014-02-01 LAB — RAPID URINE DRUG SCREEN, HOSP PERFORMED
AMPHETAMINES: NOT DETECTED
BENZODIAZEPINES: NOT DETECTED
Barbiturates: NOT DETECTED
Cocaine: NOT DETECTED
Opiates: NOT DETECTED
TETRAHYDROCANNABINOL: NOT DETECTED

## 2014-02-01 LAB — URINE MICROSCOPIC-ADD ON

## 2014-02-01 LAB — TROPONIN I
Troponin I: <0.3 ng/mL
Troponin I: <0.3 ng/mL

## 2014-02-01 LAB — HEMOGLOBIN A1C
Hgb A1c MFr Bld: 5.4 % (ref <5.7)
Mean Plasma Glucose: 108 mg/dL (ref <117)

## 2014-02-01 MED ORDER — CHLORHEXIDINE GLUCONATE 0.12 % MT SOLN
15.0000 mL | Freq: Two times a day (BID) | OROMUCOSAL | Status: DC
  Administered 2014-02-01 – 2014-02-03 (×4): 15 mL via OROMUCOSAL
  Filled 2014-02-01 (×7): qty 15

## 2014-02-01 MED ORDER — OLANZAPINE 5 MG PO TBDP
5.0000 mg | ORAL_TABLET | Freq: Once | ORAL | Status: AC
Start: 2014-02-01 — End: 2014-02-01
  Administered 2014-02-01: 5 mg via ORAL
  Filled 2014-02-01: qty 1

## 2014-02-01 MED ORDER — POTASSIUM CHLORIDE 10 MEQ/100ML IV SOLN
10.0000 meq | INTRAVENOUS | Status: AC
  Administered 2014-02-01 (×4): 10 meq via INTRAVENOUS
  Filled 2014-02-01: qty 100

## 2014-02-01 MED ORDER — SODIUM CHLORIDE 0.9 % IV SOLN
INTRAVENOUS | Status: DC
  Administered 2014-02-01 – 2014-02-02 (×3): via INTRAVENOUS

## 2014-02-01 MED ORDER — CETYLPYRIDINIUM CHLORIDE 0.05 % MT LIQD
7.0000 mL | Freq: Two times a day (BID) | OROMUCOSAL | Status: DC
  Administered 2014-02-01 – 2014-02-03 (×5): 7 mL via OROMUCOSAL

## 2014-02-01 NOTE — Progress Notes (Signed)
Echo Lab  2D Echocardiogram completed.  [employer], RDCS 02/01/2014 9:08 AM

## 2014-02-01 NOTE — Consult Note (Signed)
PULMONARY / CRITICAL CARE MEDICINE   Name: Benjamin Park MRN: [id number] DOB: July 11, 1940    ADMISSION DATE:  01/31/2014 CONSULTATION DATE:  01/31/14  REFERRING MD :  TRH  CHIEF COMPLAINT:  Hypertensive emergency  INITIAL PRESENTATION: Pt with hypertensive encephalopathy   STUDIES:  Head CT - no acute findings CXR - no acute infiltrate Brain MRI 11/15>>> Echo 11/15>>> Carotids 11/15>>>  SIGNIFICANT EVENTS: Started on cardene gtt 01/31/14 11/15- off cardene  SUBJECTIVE: off cardene  VITAL SIGNS: Temp:  [97.4 F (36.3 C)-99.8 F (37.7 C)] 97.7 F (36.5 C) (11/15 1200) Pulse Rate:  [79-116] 98 (11/15 0900) Resp:  [14-29] 19 (11/15 0900) BP: (98-194)/(62-113) 134/74 mmHg (11/15 0900) SpO2:  [84 %-100 %] 100 % (11/15 0900) Weight:  [71.1 kg (156 lb 12 oz)] 71.1 kg (156 lb 12 oz) (11/14 2129) HEMODYNAMICS:   VENTILATOR SETTINGS:   INTAKE / OUTPUT:  Intake/Output Summary (Last 24 hours) at 02/01/14 1211 Last data filed at 02/01/14 0900  Gross per 24 hour  Intake    665 ml  Output    100 ml  Net    565 ml    PHYSICAL EXAMINATION: General:  Laying in bed, no acute distress Neuro:  Awake, lethargic rass -2, said name, yes, moves all ext equal HEENT:  PERRL, EOMI, OP clear, Dentures in place Cardiovascular:   s1 s2 RRR Lungs:  CTA reduced Abdomen:  Soft, NTND, +BS, no HSM Musculoskeletal:  No joint abnormalities Skin:  No bruises, rashes or other lesions.   LABS:  CBC  Recent Labs Lab 01/31/14 1749 01/31/14 1808  WBC 9.9  --   HGB 15.5 16.3  HCT 46.0 48.0  PLT 171  --    Coag's  Recent Labs Lab 01/31/14 1749  APTT 35  INR 1.08   BMET  Recent Labs Lab 01/31/14 1749 01/31/14 1808 02/01/14 0927  NA 142 143 137  K 4.4 4.2 3.3*  CL 101 103 97  CO2 28  --  25  BUN 25* 26* 23  CREATININE 1.12 1.20 1.03  GLUCOSE 129* 132* 161*   Electrolytes  Recent Labs Lab 01/31/14 1749 02/01/14 0927  CALCIUM 9.6 9.0   Sepsis Markers No results  for input(s): LATICACIDVEN, PROCALCITON, O2SATVEN in the last 168 hours. ABG  Recent Labs Lab 01/31/14 2215  PHART 7.455*  PCO2ART 35.2  PO2ART 141.0*   Liver Enzymes  Recent Labs Lab 01/31/14 1749  AST 25  ALT 14  ALKPHOS 72  BILITOT 0.4  ALBUMIN 3.9   Cardiac Enzymes  Recent Labs Lab 01/31/14 2220 02/01/14 0413 02/01/14 0927  TROPONINI <0.30 <0.30 <0.30   Glucose  Recent Labs Lab 01/31/14 1751  GLUCAP 129*    Imaging Ct Head Wo Contrast  01/31/2014   CLINICAL DATA:  Code stroke, history of old hemorrhagic stroke, altered mental status currently, initial evaluation  EXAM: CT HEAD WITHOUT CONTRAST  TECHNIQUE: Contiguous axial images were obtained from the base of the skull through the vertex without intravenous contrast.  COMPARISON:  11/12/2012, 11/11/2012  FINDINGS: Severe diffuse atrophy. Moderate low attenuation in the deep white matter. No hydrocephalus. No hemorrhage or extra-axial fluid. No acute vascular territory infarct. It is retention cyst left maxillary sinus not significantly different from prior study. Calvarium intact.  IMPRESSION: No acute intracranial abnormalities. Critical Value/emergent results were called by telephone at the time of interpretation on 01/31/2014 at 6:23 pm to Dr. Doy Mince, who verbally acknowledged these results.   Electronically Signed   By: Kyung Rudd  Rubner M.D.   On: 01/31/2014 18:24   Dg Chest Port 1 View  01/31/2014   CLINICAL DATA:  Code stroke. History of hypertension, coronary artery disease, subarachnoid hemorrhage  EXAM: PORTABLE CHEST - 1 VIEW  COMPARISON:  11/14/2012  FINDINGS: There are bilateral chronic thickened interstitial markings. There are sudden increased interstitial markings at the left lung base. There is no other focal parenchymal opacity, pleural effusion, or pneumothorax. The heart and mediastinal contours are unremarkable.  The osseous structures are unremarkable.  IMPRESSION: Bilateral chronic interstitial  thickening. Slightly increased interstitial disease of the left lung base concerning for superimposed acute infectious or inflammatory etiology.   Electronically Signed   By: Kathreen Devoid   On: 01/31/2014 21:19     ASSESSMENT / PLAN:  PULMONARY A:h/o of OSA P:   cpap nocturnal when neuro status airway protection allows Favor even balance  CARDIOVASCULAR A: HTN P:  Wean cardene drip to maintain SBP ~150 Neuro recs to follow Echo Carotids awaited Ensure on clonidine as will rebound, patch low dose to not drop too fast until MRI noted - if BP sys approaches goals Allow autoregulation  RENAL A:  hypokalemia P:   k supp Chem in am  kvo Avoid free water with r/o cva, change fluid  GASTROINTESTINAL A:  Hx of aspiration  P:   Per cardiology note does not use his thick-it as prescribed.  Should likely have repeat MBSS prior to PO intake.   new cva? Repeat slp  NEUROLOGIC A:  Hypertensive Encephalopathy vs. TIA vs. Ischemic stroke P:   RASS goal: 0 Management per neurology MRI awaited Keep sys permissive until reviewed Carotid echo  FAMILY not present   - Inter-disciplinary family meet or Palliative Care meeting due by:  11/21   TODAY'S SUMMARY: mri awaited, off drip, to tele, echo , carotids, to traid, change fluids until MRI noted   [name]. Titus Mould, MD, [place] Pgr: [facility]  Pulmonary and [place] Pager: [phone]  02/01/2014, 12:11 PM

## 2014-02-01 NOTE — Plan of Care (Signed)
Problem: Acute Treatment Outcomes Goal: BP within ordered parameters Outcome: Completed/Met Date Met:  02/01/14 Goal: Airway maintained/protected Outcome: Completed/Met Date Met:  02/01/14

## 2014-02-01 NOTE — Progress Notes (Signed)
Subjective: Patient lethargic this morning but no longer agitated.  BP controlled off Cardene.    Objective: Current vital signs: BP 134/74 mmHg  Pulse 98  Temp(Src) 97.7 F (36.5 C) (Oral)  Resp 19  Ht 5\' 10"  (1.778 m)  Wt 71.1 kg (156 lb 12 oz)  BMI 22.49 kg/m2  SpO2 100% Vital signs in last 24 hours: Temp:  [97.4 F (36.3 C)-99.8 F (37.7 C)] 97.7 F (36.5 C) (11/15 0800) Pulse Rate:  [79-116] 98 (11/15 0900) Resp:  [14-29] 19 (11/15 0900) BP: (98-194)/(62-113) 134/74 mmHg (11/15 0900) SpO2:  [84 %-100 %] 100 % (11/15 0900) Weight:  [71.1 kg (156 lb 12 oz)] 71.1 kg (156 lb 12 oz) (11/14 2129)  Intake/Output from previous day: 11/14 0701 - 11/15 0700 In: 565 [I.V.:565] Out: 100 [Urine:100] Intake/Output this shift: Total I/O In: 100 [I.V.:100] Out: -  Nutritional status: Diet NPO time specified  Neurologic Exam: Mental Status: Lethargic requiring sternal rub to alert.  No speech other than "ouch".  Does not follow commands.  Cranial Nerves: II: Discs flat bilaterally; Pupils equal, round, reactive to light and accommodation III,IV, VI: ptosis not present, extra-ocular motions unable to be evaluated.  Does not cooperate and no response to oculocephalic maneuvers. V,VII: grimace symmetric VIII: hearing unable to be tested IX,X: gag reflex present XI: bilateral shoulder shrug XII: tongue extension unable to be tested Motor: Moves all extremities in response to noxious stimuli with no gross asymmetries noted.   Sensory: Responds to noxious stimuli throughout Deep Tendon Reflexes: 1+ in the upper extremities and absent in the lower extremities Plantars: Right: downgoing   Left: downgoing Cerebellar: Unable to be performed   Lab Results: Basic Metabolic Panel:  Recent Labs Lab 01/31/14 1749 01/31/14 1808  NA 142 143  K 4.4 4.2  CL 101 103  CO2 28  --   GLUCOSE 129* 132*  BUN 25* 26*  CREATININE 1.12 1.20  CALCIUM 9.6  --     Liver Function  Tests:  Recent Labs Lab 01/31/14 1749  AST 25  ALT 14  ALKPHOS 72  BILITOT 0.4  PROT 7.4  ALBUMIN 3.9   No results for input(s): LIPASE, AMYLASE in the last 168 hours.  Recent Labs Lab 01/31/14 2220  AMMONIA 19    CBC:  Recent Labs Lab 01/31/14 1749 01/31/14 1808  WBC 9.9  --   NEUTROABS 8.3*  --   HGB 15.5 16.3  HCT 46.0 48.0  MCV 100.2*  --   PLT 171  --     Cardiac Enzymes:  Recent Labs Lab 01/31/14 2220 02/01/14 0413  TROPONINI <0.30 <0.30    Lipid Panel:  Recent Labs Lab 02/01/14 0413  CHOL 179  TRIG 90  HDL 44  CHOLHDL 4.1  VLDL 18  LDLCALC 117*    CBG:  Recent Labs Lab 01/31/14 1751  GLUCAP 129*    Microbiology: Results for orders placed or performed during the hospital encounter of 01/31/14  MRSA PCR Screening     Status: None   Collection Time: 01/31/14  9:51 PM  Result Value Ref Range Status   MRSA by PCR NEGATIVE NEGATIVE Final    Comment:        The GeneXpert MRSA Assay (FDA approved for NASAL specimens only), is one component of a comprehensive MRSA colonization surveillance program. It is not intended to diagnose MRSA infection nor to guide or monitor treatment for MRSA infections.     Coagulation Studies:  Recent Labs  01/31/14  [place] 14.2  INR 1.08    Imaging: Ct Head Wo Contrast  01/31/2014   CLINICAL DATA:  Code stroke, history of old hemorrhagic stroke, altered mental status currently, initial evaluation  EXAM: CT HEAD WITHOUT CONTRAST  TECHNIQUE: Contiguous axial images were obtained from the base of the skull through the vertex without intravenous contrast.  COMPARISON:  11/12/2012, 11/11/2012  FINDINGS: Severe diffuse atrophy. Moderate low attenuation in the deep white matter. No hydrocephalus. No hemorrhage or extra-axial fluid. No acute vascular territory infarct. It is retention cyst left maxillary sinus not significantly different from prior study. Calvarium intact.  IMPRESSION: No acute  intracranial abnormalities. Critical Value/emergent results were called by telephone at the time of interpretation on 01/31/2014 at 6:23 pm to Dr. Doy Mince, who verbally acknowledged these results.   Electronically Signed   By: Skipper Cliche M.D.   On: 01/31/2014 18:24   Dg Chest Port 1 View  01/31/2014   CLINICAL DATA:  Code stroke. History of hypertension, coronary artery disease, subarachnoid hemorrhage  EXAM: PORTABLE CHEST - 1 VIEW  COMPARISON:  11/14/2012  FINDINGS: There are bilateral chronic thickened interstitial markings. There are sudden increased interstitial markings at the left lung base. There is no other focal parenchymal opacity, pleural effusion, or pneumothorax. The heart and mediastinal contours are unremarkable.  The osseous structures are unremarkable.  IMPRESSION: Bilateral chronic interstitial thickening. Slightly increased interstitial disease of the left lung base concerning for superimposed acute infectious or inflammatory etiology.   Electronically Signed   By: Kathreen Devoid   On: 01/31/2014 21:19    Medications:  I have reviewed the patient's current medications. Scheduled: . antiseptic oral rinse  7 mL Mouth Rinse q12n4p  . aspirin  300 mg Rectal Daily   Or  . aspirin  325 mg Oral Daily  . chlorhexidine  15 mL Mouth Rinse BID  . enoxaparin (LOVENOX) injection  40 mg Subcutaneous Q24H    Assessment/Plan: Patient although not as combative still not at baseline, no speech and does not follow commands.  BP now controlled.  Last sedation at 7pm on yesterday.  Stroke w/u pending.  Recommendations: 1.  Will continue to follow up stroke w/u.  Would continue ASA and BP control.     LOS: 1 day   Alexis Goodell, MD [facility] [phone] 02/01/2014  9:25 AM

## 2014-02-01 NOTE — Plan of Care (Signed)
Problem: Acute Treatment Outcomes Goal: 02 Sats > 94% Outcome: Completed/Met Date Met:  02/01/14 Goal: tPA Patient w/o S&S of bleeding Outcome: Not Applicable Date Met:  44/92/01

## 2014-02-01 NOTE — Progress Notes (Signed)
SLP Cancellation Note  Patient Details Name: Benjamin Park MRN: [id number] DOB: January 01, 1941   Cancelled treatment:        Unable to complete BSE at this time, as pt is poorly responsive per RN. Pt has multiple high risk indicators (CVA, SAH, Parkinson's, Throat CA s/p radiation (2000) with severe dysphagia per 10/2012 MBS), so bedside assessment alone would be insufficient to evaluate pt swallow function and safety and identify least restrictive diet. Pt will need to be alert and responsive enough to participate in repeat MBS prior to initiation of po intake. ST to continue to follow for readiness for po intake and objective study. RN aware and will contact SLP if pt status improves.  Celia B. Quentin Ore [facility], CCC-SLP [phone]   Shonna Chock 02/01/2014, 9:24 AM

## 2014-02-01 NOTE — Progress Notes (Addendum)
Bilateral carotid artery duplex attempted.  Bilateral:  Vertebral artery flow is antegrade.  Right:  60-79% ICA stenosis.  Left:  Patient became agitated before I could evaluate the ICA.

## 2014-02-01 NOTE — Progress Notes (Signed)
PT Cancellation Note  Patient Details Name: Benjamin Park MRN: [id number] DOB: Apr 26, 1940   Cancelled Treatment:    Reason Eval/Treat Not Completed: Patient not medically ready.  Pt currently on bedrest.  Please advance activity order when appropriate for PT eval.  Thanks.     Ulyana Pitones, Thornton Papas 02/01/2014, 7:55 AM

## 2014-02-02 ENCOUNTER — Inpatient Hospital Stay (HOSPITAL_COMMUNITY): Payer: Medicare HMO

## 2014-02-02 DIAGNOSIS — I6521 Occlusion and stenosis of right carotid artery: Secondary | ICD-10-CM

## 2014-02-02 DIAGNOSIS — D72829 Elevated white blood cell count, unspecified: Secondary | ICD-10-CM

## 2014-02-02 DIAGNOSIS — I674 Hypertensive encephalopathy: Secondary | ICD-10-CM | POA: Diagnosis present

## 2014-02-02 LAB — CBC WITH DIFFERENTIAL/PLATELET
BASOS ABS: 0 10*3/uL (ref 0.0–0.1)
BASOS PCT: 0 % (ref 0–1)
EOS PCT: 0 % (ref 0–5)
Eosinophils Absolute: 0 10*3/uL (ref 0.0–0.7)
HEMATOCRIT: 47.6 % (ref 39.0–52.0)
HEMOGLOBIN: 16.3 g/dL (ref 13.0–17.0)
Lymphocytes Relative: 5 % — ABNORMAL LOW (ref 12–46)
Lymphs Abs: 0.8 10*3/uL (ref 0.7–4.0)
MCH: 33.6 pg (ref 26.0–34.0)
MCHC: 34.2 g/dL (ref 30.0–36.0)
MCV: 98.1 fL (ref 78.0–100.0)
MONOS PCT: 5 % (ref 3–12)
Monocytes Absolute: 0.7 10*3/uL (ref 0.1–1.0)
Neutro Abs: 13.5 10*3/uL — ABNORMAL HIGH (ref 1.7–7.7)
Neutrophils Relative %: 90 % — ABNORMAL HIGH (ref 43–77)
Platelets: 166 10*3/uL (ref 150–400)
RBC: 4.85 MIL/uL (ref 4.22–5.81)
RDW: 13.7 % (ref 11.5–15.5)
WBC: 15 10*3/uL — ABNORMAL HIGH (ref 4.0–10.5)

## 2014-02-02 LAB — COMPREHENSIVE METABOLIC PANEL
ALT: 14 U/L (ref 0–53)
AST: 24 U/L (ref 0–37)
Albumin: 3.6 g/dL (ref 3.5–5.2)
Alkaline Phosphatase: 68 U/L (ref 39–117)
Anion gap: 14 (ref 5–15)
BILIRUBIN TOTAL: 0.7 mg/dL (ref 0.3–1.2)
BUN: 20 mg/dL (ref 6–23)
CHLORIDE: 101 meq/L (ref 96–112)
CO2: 26 mEq/L (ref 19–32)
Calcium: 9 mg/dL (ref 8.4–10.5)
Creatinine, Ser: 0.83 mg/dL (ref 0.50–1.35)
GFR calc Af Amer: >90 mL/min (ref >90)
GFR calc non Af Amer: 86 mL/min — ABNORMAL LOW (ref >90)
Glucose, Bld: 117 mg/dL — ABNORMAL HIGH (ref 70–99)
POTASSIUM: 3.4 meq/L — AB (ref 3.7–5.3)
SODIUM: 141 meq/L (ref 137–147)
TOTAL PROTEIN: 7 g/dL (ref 6.0–8.3)

## 2014-02-02 LAB — TSH: TSH: 8.52 u[IU]/mL — ABNORMAL HIGH (ref 0.350–4.500)

## 2014-02-02 MED ORDER — POTASSIUM CHLORIDE CRYS ER 20 MEQ PO TBCR
40.0000 meq | EXTENDED_RELEASE_TABLET | Freq: Once | ORAL | Status: AC
  Administered 2014-02-02: 40 meq via ORAL
  Filled 2014-02-02: qty 2

## 2014-02-02 MED ORDER — LEVOTHYROXINE SODIUM 100 MCG PO TABS
100.0000 ug | ORAL_TABLET | Freq: Every day | ORAL | Status: DC
  Administered 2014-02-03: 100 ug via ORAL
  Filled 2014-02-02 (×2): qty 1

## 2014-02-02 MED ORDER — RESOURCE THICKENUP CLEAR PO POWD
ORAL | Status: DC | PRN
  Filled 2014-02-02: qty 125

## 2014-02-02 MED ORDER — ATORVASTATIN CALCIUM 10 MG PO TABS
10.0000 mg | ORAL_TABLET | Freq: Every day | ORAL | Status: DC
  Administered 2014-02-02 – 2014-02-03 (×2): 10 mg via ORAL
  Filled 2014-02-02 (×2): qty 1

## 2014-02-02 MED ORDER — LABETALOL HCL 100 MG PO TABS
100.0000 mg | ORAL_TABLET | Freq: Two times a day (BID) | ORAL | Status: DC
  Administered 2014-02-02 – 2014-02-03 (×3): 100 mg via ORAL
  Filled 2014-02-02 (×4): qty 1

## 2014-02-02 NOTE — Procedures (Signed)
Objective Swallowing Evaluation:    Patient Details  Name: Benjamin Park MRN: [id number] Date of Birth: 1940/09/27  Today's Date: 02/02/2014 Time:  -     Past Medical History:  Past Medical History  Diagnosis Date  . Hx of transient ischemic attack (TIA)   . Hyperlipidemia   . ED (erectile dysfunction)   . Personal history of kidney stones   . Subarachnoid hemorrhage     '11- hospitalized -3 to 4 days(passed out-awoke in hospital)- no residual affects.  . Myocardial infarction     12/1991  . Dysphagia   . Hypokalemia   . Hypothyroidism   . Sleep apnea     on CPAP-settings 6  . History of kidney stones 02-27-13    x5- none recent  . Throat cancer     '00-radiation therapy  . Stroke   . Unspecified cerebral artery occlusion with cerebral infarction 11/08/2012  . Episodic confusion 02-27-13    hx. of, not currently(multiple episodes- ? TIA's)-always saw MD to be evaluated.  Marland Kitchen AKI (acute kidney injury) 02/23/2011    pt. denies any problems 02-27-13  . Hypertension     hx. labile - with spikes every 3-6 mos.-last 8'14 ED visit.  Marland Kitchen Hypertensive encephalopathy   . Headache(784.0)     episodic with periods of confusion -multiple  . Neuromuscular disorder     "parkinson's" -no tremor-sees neurology MD at [facility]  . Coronary artery disease     Dr. [place]   Past Surgical History:  Past Surgical History  Procedure Laterality Date  . Angioplasty  1993    LAD  . Cardiac catheterization    . Coronary angioplasty    . Basal cell cancer      head  . Hernia repair    . Appendectomy    . Patent foramen ovale closure  2008  . Eye surgery    . Colonoscopy N/A 06/18/2012    Procedure: COLONOSCOPY;  Surgeon: Jeryl Columbia, MD;  Location: [facility] ENDOSCOPY;  Service: Endoscopy;  Laterality: N/A;  h&p in file-[place]   . Hot hemostasis N/A 06/18/2012    Procedure: HOT HEMOSTASIS (ARGON PLASMA COAGULATION/BICAP);  Surgeon: Jeryl Columbia, MD;  Location: [facility] ENDOSCOPY;  Service: Endoscopy;   Laterality: N/A;  . Colonoscopy with propofol N/A 03/07/2013    Procedure: COLONOSCOPY WITH PROPOFOL;  Surgeon: Jeryl Columbia, MD;  Location: [facility] ENDOSCOPY;  Service: Endoscopy;  Laterality: N/A;   HPI:  73 y.o. male with PMH:  hypertension, subarachnoid hemorrhage 2011, MI, throat cancer 2000 radiation tx, Parkinson's, TIA admitted after being found unresponsive in his car. MBS 8/14 with recommendations Dys 1 and pudding thick liquids.  Follow up treatment 4 days later recommended diet upgrade to regular and nectar thick liquids (pt. reported to SLP he "usually did not use his thickener at home").  CXR no new active cardiopulmonary disease.     Assessment / Plan / Recommendation Clinical Impression  Clinical impression: Pt. exhibited mild oral and mild-moderate pharyngeal phase dysphagia characterized by reduced tongue base retraction and decreased laryngeal elevation resulting in silent aspiration with honey thick liquids.  Pharyngeal residue overall mod-max with incomplete clearing given verbal cues for second swallows (difficulty initiating dry swallow).  Cervical spine appears somewhat kyphotic and may be preventing a coordinated swallow.  Recommend Dys 1 pudding thick liquids.  Pt. has history of dysphagia and was able to decrease liquid viscosity during previous admission 8/14.  ST will continue to follow and treat.  Treatment Recommendation  Therapy as outlined in treatment plan below    Diet Recommendation Dysphagia 1 (Puree);Pudding-thick liquid   Liquid Administration via: Spoon Medication Administration: Crushed with puree Supervision: Patient able to self feed;Full supervision/cueing for compensatory strategies Compensations: Slow rate;Small sips/bites;Multiple dry swallows after each bite/sip Postural Changes and/or Swallow Maneuvers: Seated upright 90 degrees    Other  Recommendations Oral Care Recommendations: Oral care BID   Follow Up Recommendations   (TBD)     Frequency and Duration min 2x/week  2 weeks   Pertinent Vitals/Pain No pain            Reason for Referral Objectively evaluate swallowing function   Oral Phase     Pharyngeal Phase Pharyngeal - Honey Pharyngeal - Honey Teaspoon: Pharyngeal residue - valleculae;Pharyngeal residue - pyriform sinuses;Reduced tongue base retraction;Reduced laryngeal elevation;Penetration/Aspiration during swallow;Reduced airway/laryngeal closure Penetration/Aspiration details (honey teaspoon): Material enters airway, passes BELOW cords without attempt by patient to eject out (silent aspiration) Pharyngeal - Solids Pharyngeal - Puree: Pharyngeal residue - pyriform sinuses;Pharyngeal residue - valleculae;Reduced tongue base retraction;Reduced laryngeal elevation  Cervical Esophageal Phase    GO              Benjamin Park 02/02/2014, 1:44 PM  Benjamin Park [place] [phone]

## 2014-02-02 NOTE — Progress Notes (Signed)
Speech Language Pathology Treatment:    Patient Details Name: Benjamin Park MRN: [id number] DOB: 1941-02-12 Today's Date: 02/02/2014 Time:  -        Pt. Alert this morning.  MBS scheduled for 9:30 today.    [name] [place] [phone]

## 2014-02-02 NOTE — Progress Notes (Signed)
PROGRESS NOTE  Benjamin Park EHM:[id number] DOB: 1940-11-29 DOA: 01/31/2014 PCP: Myriam Jacobson, MD  Brief history 72 year old male with history of hypertension, subarachnoid hemorrhage, hyperlipidemia, coronary artery disease presented with acute confusion.  Apparently, the patient was supposed to take her daughter lunch on day of admission. When he was late, the patient's son was called later found the patientconfused and minimally responsive in his car. EMS was activated. The patient was found to have significant elevation of his blood pressure in the emergency department not responsive to hydralazine or labetalol. The patient was admitted to ICU and placed on a Cardene drip. His Cardene drip was ultimately weaned off and the patient remained clinically stable to be transferred to the medical floor. Neurology was consulted and a full stroke workup was undertaken. Assessment/Plan: Acute encephalopathy -Secondary to hypertensive encephalopathy -02/02/14--Patient is less combative and more alert -unclear baseline -ammonia--19 -check B12 -TSH--8.520 -MRI Brain--neg for stroke, bleed, mass -MRA brain--neg for major arterial stenosis -UDS neg Dysphagia -speech eval-->dysphagia 1 with pudding thickened  Leukocytosis -UA neg for pyuria -CXR without infiltrate -will not start abx as pt is afebrile and hemodynamically stable Malignant HTN -restart Normodyne and monitor -avoid clonidine due to rebound HTN Hyperlipidemia -LDL 117 -restart statin Hypokalemia -check mag -replete Hypothyroidism -TSH 8.520 -Unclear if the patient was taking Synthroid properly -Restart home dose -Repeat TSH in 4-6 weeks Family Communication:   Pt at beside Disposition Plan:   Home when medically stable       Procedures/Studies: Ct Head Wo Contrast  01/31/2014   CLINICAL DATA:  Code stroke, history of old hemorrhagic stroke, altered mental status currently, initial evaluation   EXAM: CT HEAD WITHOUT CONTRAST  TECHNIQUE: Contiguous axial images were obtained from the base of the skull through the vertex without intravenous contrast.  COMPARISON:  11/12/2012, 11/11/2012  FINDINGS: Severe diffuse atrophy. Moderate low attenuation in the deep white matter. No hydrocephalus. No hemorrhage or extra-axial fluid. No acute vascular territory infarct. It is retention cyst left maxillary sinus not significantly different from prior study. Calvarium intact.  IMPRESSION: No acute intracranial abnormalities. Critical Value/emergent results were called by telephone at the time of interpretation on 01/31/2014 at 6:23 pm to Dr. Doy Mince, who verbally acknowledged these results.   Electronically Signed   By: Skipper Cliche M.D.   On: 01/31/2014 18:24   Mr Brain Wo Contrast  02/01/2014   CLINICAL DATA:  Altered mental status and confusion, acute in onset yesterday.  EXAM: MRI HEAD WITHOUT CONTRAST  MRA HEAD WITHOUT CONTRAST  TECHNIQUE: Multiplanar, multiecho pulse sequences of the brain and surrounding structures were obtained without intravenous contrast. Angiographic images of the head were obtained using MRA technique without contrast.  COMPARISON:  Head CT 01/31/2014, MRI 11/12/2012, and MRA 10/04/2010  FINDINGS: MRI HEAD FINDINGS  Images are moderately degraded by motion artifact. Abnormal diffusion weighted signal and T2 hyperintensity in the corpus callosum, middle cerebellar peduncles, and scattered throughout the bilateral frontal and parietal white matter does not appear significantly changed from the prior study. No definite acute infarct is identified. Patchy T2 hyperintensities elsewhere in the cerebral white matter bilaterally do not appear significantly changed from the prior MRI and are nonspecific but compatible with moderate chronic small vessel ischemic disease. Advanced cerebral and cerebellar atrophy is unchanged. No intracranial hemorrhage, mass, midline shift, or extra-axial fluid  collection is seen.  Orbits are unremarkable. There is left greater the right maxillary sinus polypoid mucosal  thickening. Mastoid air cells are clear. Major intracranial vascular flow voids are grossly preserved.  MRA HEAD FINDINGS  Images are moderately to severely degraded by motion artifact, limiting branch vessel evaluation. Visualized distal vertebral arteries are patent with the left being slightly larger than the right. SCA origins appear patent. Basilar artery is patent without evidence of significant stenosis. PCAs are patent without evidence of high-grade stenosis. There may be mild narrowing of the left PCA near the P1-P2 junction.  Internal carotid arteries are patent from skullbase to carotid termini without gross stenosis. M1 segments are patent without gross stenosis. MCA branch vessel evaluation is limited by motion. Right A1 segment appears hypoplastic. Left A1 segment is grossly unremarkable. Left A2 segment is dominant and patent.  IMPRESSION: 1. No acute intracranial abnormality identified. 2. Unchanged, advanced cerebral atrophy and chronic small vessel ischemic disease. Chronic signal changes in the corpus callosum and middle cerebellar peduncles are also unchanged. 3. Motion degraded MRA. No evidence of major intracranial arterial occlusion or high-grade proximal stenosis.   Electronically Signed   By: Logan Bores   On: 02/01/2014 12:17   Dg Chest Port 1 View  02/02/2014   CLINICAL DATA:  Hypertension  EXAM: PORTABLE CHEST - 1 VIEW  COMPARISON:  12/22/2013, 01/31/2014, 11/12/2012, 04/12/2012  FINDINGS: There is bilateral chronic interstitial thickening. There slightly more increased interstitial markings in the left perihilar region which is similar to 04/09/2012. There is no focal consolidation, pleural effusion, or pneumothorax. The heart and mediastinal contours are unremarkable.  The osseous structures are unremarkable.  IMPRESSION: Bilateral chronic interstitial thickening.  No new  active cardiopulmonary disease.   Electronically Signed   By: Kathreen Devoid   On: 02/02/2014 08:17   Dg Chest Port 1 View  01/31/2014   CLINICAL DATA:  Code stroke. History of hypertension, coronary artery disease, subarachnoid hemorrhage  EXAM: PORTABLE CHEST - 1 VIEW  COMPARISON:  11/14/2012  FINDINGS: There are bilateral chronic thickened interstitial markings. There are sudden increased interstitial markings at the left lung base. There is no other focal parenchymal opacity, pleural effusion, or pneumothorax. The heart and mediastinal contours are unremarkable.  The osseous structures are unremarkable.  IMPRESSION: Bilateral chronic interstitial thickening. Slightly increased interstitial disease of the left lung base concerning for superimposed acute infectious or inflammatory etiology.   Electronically Signed   By: Kathreen Devoid   On: 01/31/2014 21:19   Dg Swallowing Func-speech Pathology  02/02/2014   [name], CCC-SLP     02/02/2014  1:44 PM Objective Swallowing Evaluation:    Patient Details  Name: Benjamin Park MRN: [id number] Date of Birth: 1941/02/02  Today's Date: 02/02/2014 Time:  -     Past Medical History:  Past Medical History  Diagnosis Date  . Hx of transient ischemic attack (TIA)   . Hyperlipidemia   . ED (erectile dysfunction)   . Personal history of kidney stones   . Subarachnoid hemorrhage     '11- hospitalized -3 to 4 days(passed out-awoke in hospital)-  no residual affects.  . Myocardial infarction     12/1991  . Dysphagia   . Hypokalemia   . Hypothyroidism   . Sleep apnea     on CPAP-settings 6  . History of kidney stones 02-27-13    x5- none recent  . Throat cancer     '00-radiation therapy  . Stroke   . Unspecified cerebral artery occlusion with cerebral infarction  11/08/2012  . Episodic confusion 02-27-13    hx.  of, not currently(multiple episodes- ? TIA's)-always saw MD  to be evaluated.  Marland Kitchen AKI (acute kidney injury) 02/23/2011    pt. denies any problems 02-27-13  .  Hypertension     hx. labile - with spikes every 3-6 mos.-last 8'14 ED visit.  Marland Kitchen Hypertensive encephalopathy   . Headache(784.0)     episodic with periods of confusion -multiple  . Neuromuscular disorder     "parkinson's" -no tremor-sees neurology MD at [facility]  . Coronary artery disease     Dr. [place]   Past Surgical History:  Past Surgical History  Procedure Laterality Date  . Angioplasty  1993    LAD  . Cardiac catheterization    . Coronary angioplasty    . Basal cell cancer      head  . Hernia repair    . Appendectomy    . Patent foramen ovale closure  2008  . Eye surgery    . Colonoscopy N/A 06/18/2012    Procedure: COLONOSCOPY;  Surgeon: Jeryl Columbia, MD;  Location:  [facility] ENDOSCOPY;  Service: Endoscopy;  Laterality: N/A;  h&p in  file-hope   . Hot hemostasis N/A 06/18/2012    Procedure: HOT HEMOSTASIS (ARGON PLASMA COAGULATION/BICAP);   Surgeon: Jeryl Columbia, MD;  Location: [facility] ENDOSCOPY;  Service:  Endoscopy;  Laterality: N/A;  . Colonoscopy with propofol N/A 03/07/2013    Procedure: COLONOSCOPY WITH PROPOFOL;  Surgeon: Jeryl Columbia,  MD;  Location: [facility] ENDOSCOPY;  Service: Endoscopy;  Laterality:  N/A;   HPI:  73 y.o. male with PMH:  hypertension, subarachnoid hemorrhage  2011, MI, throat cancer 2000 radiation tx, Parkinson's, TIA  admitted after being found unresponsive in his car. MBS 8/14 with  recommendations Dys 1 and pudding thick liquids.  Follow up  treatment 4 days later recommended diet upgrade to regular and  nectar thick liquids (pt. reported to SLP he "usually did not use  his thickener at home").  CXR no new active cardiopulmonary  disease.     Assessment / Plan / Recommendation Clinical Impression  Clinical impression: Pt. exhibited mild oral and mild-moderate  pharyngeal phase dysphagia characterized by reduced tongue base  retraction and decreased laryngeal elevation resulting in silent  aspiration with honey thick liquids.  Pharyngeal residue overall  mod-max with incomplete clearing given verbal  cues for second  swallows (difficulty initiating dry swallow).  Cervical spine  appears somewhat kyphotic and may be preventing a coordinated  swallow.  Recommend Dys 1 pudding thick liquids.  Pt. has history  of dysphagia and was able to decrease liquid viscosity during  previous admission 8/14.  ST will continue to follow and treat.        Treatment Recommendation  Therapy as outlined in treatment plan below    Diet Recommendation Dysphagia 1 (Puree);Pudding-thick liquid   Liquid Administration via: Spoon Medication Administration: Crushed with puree Supervision: Patient able to self feed;Full supervision/cueing  for compensatory strategies Compensations: Slow rate;Small sips/bites;Multiple dry swallows  after each bite/sip Postural Changes and/or Swallow Maneuvers: Seated upright 90  degrees    Other  Recommendations Oral Care Recommendations: Oral care BID   Follow Up Recommendations   (TBD)    Frequency and Duration min 2x/week  2 weeks   Pertinent Vitals/Pain No pain            Reason for Referral Objectively evaluate swallowing function   Oral Phase     Pharyngeal Phase Pharyngeal - Honey Pharyngeal - Honey Teaspoon: Pharyngeal residue -  valleculae;Pharyngeal  residue - pyriform sinuses;Reduced tongue  base retraction;Reduced laryngeal  elevation;Penetration/Aspiration during swallow;Reduced  airway/laryngeal closure Penetration/Aspiration details (honey teaspoon): Material enters  airway, passes BELOW cords without attempt by patient to eject  out (silent aspiration) Pharyngeal - Solids Pharyngeal - Puree: Pharyngeal residue - pyriform  sinuses;Pharyngeal residue - valleculae;Reduced tongue base  retraction;Reduced laryngeal elevation  Cervical Esophageal Phase    GO              [name] 02/02/2014, 1:44 PM  [name] Colvin Caroli.Ed CCC-SLP Pager [phone]     Mr Jodene Nam Head/brain Wo Cm  02/01/2014   CLINICAL DATA:  Altered mental status and confusion, acute in onset yesterday.  EXAM: MRI HEAD  WITHOUT CONTRAST  MRA HEAD WITHOUT CONTRAST  TECHNIQUE: Multiplanar, multiecho pulse sequences of the brain and surrounding structures were obtained without intravenous contrast. Angiographic images of the head were obtained using MRA technique without contrast.  COMPARISON:  Head CT 01/31/2014, MRI 11/12/2012, and MRA 10/04/2010  FINDINGS: MRI HEAD FINDINGS  Images are moderately degraded by motion artifact. Abnormal diffusion weighted signal and T2 hyperintensity in the corpus callosum, middle cerebellar peduncles, and scattered throughout the bilateral frontal and parietal white matter does not appear significantly changed from the prior study. No definite acute infarct is identified. Patchy T2 hyperintensities elsewhere in the cerebral white matter bilaterally do not appear significantly changed from the prior MRI and are nonspecific but compatible with moderate chronic small vessel ischemic disease. Advanced cerebral and cerebellar atrophy is unchanged. No intracranial hemorrhage, mass, midline shift, or extra-axial fluid collection is seen.  Orbits are unremarkable. There is left greater the right maxillary sinus polypoid mucosal thickening. Mastoid air cells are clear. Major intracranial vascular flow voids are grossly preserved.  MRA HEAD FINDINGS  Images are moderately to severely degraded by motion artifact, limiting branch vessel evaluation. Visualized distal vertebral arteries are patent with the left being slightly larger than the right. SCA origins appear patent. Basilar artery is patent without evidence of significant stenosis. PCAs are patent without evidence of high-grade stenosis. There may be mild narrowing of the left PCA near the P1-P2 junction.  Internal carotid arteries are patent from skullbase to carotid termini without gross stenosis. M1 segments are patent without gross stenosis. MCA branch vessel evaluation is limited by motion. Right A1 segment appears hypoplastic. Left A1 segment is  grossly unremarkable. Left A2 segment is dominant and patent.  IMPRESSION: 1. No acute intracranial abnormality identified. 2. Unchanged, advanced cerebral atrophy and chronic small vessel ischemic disease. Chronic signal changes in the corpus callosum and middle cerebellar peduncles are also unchanged. 3. Motion degraded MRA. No evidence of major intracranial arterial occlusion or high-grade proximal stenosis.   Electronically Signed   By: Logan Bores   On: 02/01/2014 12:17         Subjective: Patient is not combative today. He is lucid. He denies any headache, visual disturbance, chest pain, shortness of breath, nausea, vomiting, diarrhea, abdominal pain. No dysuria.  Objective: Filed Vitals:   02/02/14 0435 02/02/14 0629 02/02/14 1028 02/02/14 1306  BP: 184/116 151/86 141/94 135/80  Pulse: 96  97 101  Temp: 98 F (36.7 C)  97.9 F (36.6 C) 97.8 F (36.6 C)  TempSrc: Oral  Oral Oral  Resp: 18  18 18   Height: 6' (1.829 m)     Weight:      SpO2: 100%  100% 100%    Intake/Output Summary (Last 24 hours) at 02/02/14 1506 Last data filed  at 02/02/14 1441  Gross per 24 hour  Intake    250 ml  Output    125 ml  Net    125 ml   Weight change: 0.7 kg (1 lb 8.7 oz) Exam:   General:  Pt is alert, follows commands appropriately, not in acute distress  HEENT: No icterus, No thrush, No meningismus, [place]/AT  Cardiovascular: RRR, S1/S2, no rubs, no gallops  Respiratory: CTA bilaterally, no wheezing, no crackles, no rhonchi  Abdomen: Soft/+BS, non tender, non distended, no guarding  Extremities: No edema, No lymphangitis, No petechiae, No rashes, no synovitis  Data Reviewed: Basic Metabolic Panel:  Recent Labs Lab 01/31/14 1749 01/31/14 1808 02/01/14 0927 02/02/14 0600  NA 142 143 137 141  K 4.4 4.2 3.3* 3.4*  CL 101 103 97 101  CO2 28  --  25 26  GLUCOSE 129* 132* 161* 117*  BUN 25* 26* 23 20  CREATININE 1.12 1.20 1.03 0.83  CALCIUM 9.6  --  9.0 9.0   Liver  Function Tests:  Recent Labs Lab 01/31/14 1749 02/02/14 0600  AST 25 24  ALT 14 14  ALKPHOS 72 68  BILITOT 0.4 0.7  PROT 7.4 7.0  ALBUMIN 3.9 3.6   No results for input(s): LIPASE, AMYLASE in the last 168 hours.  Recent Labs Lab 01/31/14 2220  AMMONIA 19   CBC:  Recent Labs Lab 01/31/14 1749 01/31/14 1808 02/02/14 0600  WBC 9.9  --  15.0*  NEUTROABS 8.3*  --  13.5*  HGB 15.5 16.3 16.3  HCT 46.0 48.0 47.6  MCV 100.2*  --  98.1  PLT 171  --  166   Cardiac Enzymes:  Recent Labs Lab 01/31/14 2220 02/01/14 0413 02/01/14 0927  TROPONINI <0.30 <0.30 <0.30   BNP: Invalid input(s): POCBNP CBG:  Recent Labs Lab 01/31/14 1751  GLUCAP 129*    Recent Results (from the past 240 hour(s))  MRSA PCR Screening     Status: None   Collection Time: 01/31/14  9:51 PM  Result Value Ref Range Status   MRSA by PCR NEGATIVE NEGATIVE Final    Comment:        The GeneXpert MRSA Assay (FDA approved for NASAL specimens only), is one component of a comprehensive MRSA colonization surveillance program. It is not intended to diagnose MRSA infection nor to guide or monitor treatment for MRSA infections.      Scheduled Meds: . antiseptic oral rinse  7 mL Mouth Rinse q12n4p  . aspirin  300 mg Rectal Daily   Or  . aspirin  325 mg Oral Daily  . chlorhexidine  15 mL Mouth Rinse BID  . enoxaparin (LOVENOX) injection  40 mg Subcutaneous Q24H  . labetalol  100 mg Oral BID  . potassium chloride  40 mEq Oral Once   Continuous Infusions: . sodium chloride 50 mL/hr at 02/01/14 1941  . niCARDipine Stopped (02/01/14 0100)     Eylin Pontarelli, DO  [facility] Pager [phone]  If 7PM-7AM, please contact night-coverage [email or web address] Password [email or web address] 02/02/2014, 3:06 PM   LOS: 2 days

## 2014-02-02 NOTE — Progress Notes (Signed)
OT Cancellation Note  Patient Details Name: Benjamin Park MRN: [id number] DOB: 03-Aug-1940   Cancelled Treatment:      Pt not medically ready. Pt currently on bedrest. Please update activity orders when appropriate for OT evaluation. Thanks.  [name] OTR/L [phone] 02/02/2014, 8:52 AM

## 2014-02-02 NOTE — Progress Notes (Signed)
02/02/2014 1000 UR complete. Chart reviewed. Jonnie Finner RN CCM Case Mgmt

## 2014-02-02 NOTE — Progress Notes (Signed)
PT Cancellation Note  Patient Details Name: Benjamin Park MRN: [id number] DOB: 1941/01/07   Cancelled Treatment:    Reason Eval/Treat Not Completed: Patient not medically ready.  Pt on bedrest orders at this time.  Please update activity orders when appropriate.   [name] 02/02/2014, 1:08 PM

## 2014-02-02 NOTE — Consult Note (Signed)
New Carotid Patient  Referred by:  [facility]  Reason for referral: right carotid stenosis  History of Present Illness  Benjamin Park is a 73 y.o. 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 The left ICA was not able to be evaluated due to patient agitation. He was admitted for acute encephalopathy.  Patient has history of TIA, stroke and subarachnoid hemorrhage. His MRI was normal and his encephalopathy was deemed secondary to hypertensive encephalopathy. The patient has never had amaurosis fugax or monocular blindness. he patient denies facial drooping or hemiplegia.  The patient denies receptive or expressive aphasia. The patient's risks factors for carotid disease include: hypertension, CAD, hyperlipidemia, current smoker.   His hypertension is currently managed with a betablocker. His hyperlipidemia is managed with a statin. He takes a daily aspirin. He does not have diabetes.   Past Medical History  Diagnosis Date  . Hx of transient ischemic attack (TIA)   . Hyperlipidemia   . ED (erectile dysfunction)   . Personal history of kidney stones   . Subarachnoid hemorrhage     '11- hospitalized -3 to 4 days(passed out-awoke in hospital)- no residual affects.  . Myocardial infarction     12/1991  . Dysphagia   . Hypokalemia   . Hypothyroidism   . Sleep apnea     on CPAP-settings 6  . History of kidney stones 02-27-13    x5- none recent  . Throat cancer     '00-radiation therapy  . Stroke   . Unspecified cerebral artery occlusion with cerebral infarction 11/08/2012  . Episodic confusion 02-27-13    hx. of, not currently(multiple episodes- ? TIA's)-always saw MD to be evaluated.  Marland Kitchen AKI (acute kidney injury) 02/23/2011    pt. denies any problems 02-27-13  . Hypertension     hx. labile - with spikes every 3-6 mos.-last 8'14 ED visit.  Marland Kitchen Hypertensive encephalopathy   . Headache(784.0)     episodic with periods of confusion -multiple   . Neuromuscular disorder     "parkinson's" -no tremor-sees neurology MD at [facility]  . Coronary artery disease     Dr. Martinique    Past Surgical History  Procedure Laterality Date  . Angioplasty  1993    LAD  . Cardiac catheterization    . Coronary angioplasty    . Basal cell cancer      head  . Hernia repair    . Appendectomy    . Patent foramen ovale closure  2008  . Eye surgery    . Colonoscopy N/A 06/18/2012    Procedure: COLONOSCOPY;  Surgeon: Jeryl Columbia, MD;  Location: [facility] ENDOSCOPY;  Service: Endoscopy;  Laterality: N/A;  h&p in file-hope   . Hot hemostasis N/A 06/18/2012    Procedure: HOT HEMOSTASIS (ARGON PLASMA COAGULATION/BICAP);  Surgeon: Jeryl Columbia, MD;  Location: [facility] ENDOSCOPY;  Service: Endoscopy;  Laterality: N/A;  . Colonoscopy with propofol N/A 03/07/2013    Procedure: COLONOSCOPY WITH PROPOFOL;  Surgeon: Jeryl Columbia, MD;  Location: [facility] ENDOSCOPY;  Service: Endoscopy;  Laterality: N/A;    History   Social History  . Marital Status: Married    Spouse Name: N/A    Number of Children: 4  . Years of Education: N/A   Occupational History  . Art gallery manager    Social History Main Topics  . Smoking status: Former Smoker    Types: Cigarettes    Quit date: 09/21/1990  . Smokeless tobacco: Never Used  .  Alcohol Use: No  . Drug Use: No  . Sexual Activity: Not Currently   Other Topics Concern  . Not on file   Social History Narrative    Family History  Problem Relation Age of Onset  . Heart failure Father   . Heart attack Brother   . Anemia Brother 50    mi   No current facility-administered medications on file prior to encounter.   Current Outpatient Prescriptions on File Prior to Encounter  Medication Sig Dispense Refill  . aspirin 325 MG tablet Take 325 mg by mouth daily.      . cloNIDine (CATAPRES) 0.1 MG tablet Take 0.5 tablet by mouth as needed for blood pressure than 160. 30 tablet 6  . fish oil-omega-3 fatty acids 1000 MG capsule Take 1 g by mouth  daily.     Marland Kitchen GLUCOSAMINE HCL PO Take 1 tablet by mouth daily.    Marland Kitchen labetalol (NORMODYNE) 200 MG tablet Take 1 tablet (200 mg total) by mouth 2 (two) times daily. 60 tablet 0  . levothyroxine (SYNTHROID, LEVOTHROID) 100 MCG tablet Take 100 mcg by mouth at bedtime.     . lovastatin (MEVACOR) 40 MG tablet Take 80 mg by mouth at bedtime.     . Multiple Vitamin (MULITIVITAMIN WITH MINERALS) TABS Take 1 tablet by mouth daily.      Marland Kitchen testosterone cypionate (DEPOTESTOTERONE CYPIONATE) 200 MG/ML injection Inject 1 mL (200 mg total) into the muscle every 28 (twenty-eight) days. 10 mL 5    No Known Allergies  Physical Examination  Filed Vitals:   02/02/14 0435 02/02/14 0629 02/02/14 1028 02/02/14 1306  BP: 184/116 151/86 141/94 135/80  Pulse: 96  97 101  Temp: 98 F (36.7 C)  97.9 F (36.6 C) 97.8 F (36.6 C)  TempSrc: Oral  Oral Oral  Resp: 18  18 18   Height: 6' (1.829 m)     Weight:      SpO2: 100%  100% 100%   Body mass index is 21.46 kg/(m^2).  General: A&O x 3, WD thin male in NAD  Head: [place]/AT  Neck: Supple  Eyes: pupils equal  Pulmonary: Sym exp, good air movt, CTAB, no rales, rhonchi, & wheezing  Cardiac: RRR, Nl S1, S2, no Murmurs, rubs or gallops, no carotid bruits  Vascular: 2+ radial pulses bilaterally, 2+ dorsalis pedis pulses bilaterally  Gastrointestinal: soft, NTND  Musculoskeletal: M/S 5/5 throughout. Extremities without ischemic changes.  Neurologic: CN 2-12 grossly intact except slow to understand commands for extra-ocular movements test. No smile asymmetry. Follows othercommands. Pain and light touch intact in extremities.  Motor exam as listed above  Psychiatric: Judgment intact, Mood & affect appropriate for pt's clinical situation  Dermatologic: See M/S exam for extremity exam, no rashes otherwise noted    Non-Invasive Vascular Imaging  CAROTID DUPLEX (Date: 02/02/2014):   R ICA stenosis: 60-79%  R VA:  patent and antegrade  L ICA stenosis:  unable to obtain due to patient agitation   Medical Decision Making  Benjamin Park is a 73 y.o. male who presents with acute encephalopathy secondary to hypertensive encephalopathy and asymptomatic right ICA stenosis 60-79%. Left ICA unable to be obtained due to agitation. Stroke work up was negative.  Ordered left ICA to evaluate left side. He will not require any surgical intervention or angiography studies. Patient will need routine carotid artery surveillance. He is currently on an aspirin and statin. Dr. Donnetta Hutching to see patient.    Virgina Jock, PA-C [facility] [place] Office: [phone] Pager: [phone]  02/02/2014, 3:17 PM       I have examined the patient, reviewed and agree with above.admitting event felt to be related to hypertensive encephalopathy. No focal neurologic deficits and MR showing no acute stroke. Does have moderate to severe right ICA stenosis. We'll repeat duplex since he was unable to cooperate for left carotid imaging. Will follow along but no evidence that this was the cause of his current neurologic deficit.  Trevia Nop, MD 02/02/2014 6:07 PM

## 2014-02-02 NOTE — Progress Notes (Signed)
Subjective: Continues to improve,   Exam: Filed Vitals:   02/02/14 1306  BP: 135/80  Pulse: 101  Temp: 97.8 F (36.6 C)  Resp: 18   Gen: In bed, NAD MS: awake, alert, oriented to year, but gives month as September, slow to respond, but does follow commands, and answer simple questions.  EG:BTDVV, EOMI, VFF, face symmetric Motor: 5/5 throughout Sensory:intact to LT.    Impression: 73 yo M with presentation with hypertensive emergency and acute encephalopathy. I suspect that this is related to hypertensive encephalopathy given the normal MRI and gradual improvement now that his BP is under better control.   Recommendations: 1) Continue BP control  Roland Rack, MD [facility] [phone]  If 7pm- 7am, please page neurology on call as listed in [place].

## 2014-02-03 DIAGNOSIS — E785 Hyperlipidemia, unspecified: Secondary | ICD-10-CM

## 2014-02-03 LAB — CBC
HCT: 45.2 % (ref 39.0–52.0)
Hemoglobin: 14.9 g/dL (ref 13.0–17.0)
MCH: 33.3 pg (ref 26.0–34.0)
MCHC: 33 g/dL (ref 30.0–36.0)
MCV: 101.1 fL — ABNORMAL HIGH (ref 78.0–100.0)
Platelets: 159 10*3/uL (ref 150–400)
RBC: 4.47 MIL/uL (ref 4.22–5.81)
RDW: 14 % (ref 11.5–15.5)
WBC: 9.5 10*3/uL (ref 4.0–10.5)

## 2014-02-03 LAB — COMPREHENSIVE METABOLIC PANEL
ALK PHOS: 59 U/L (ref 39–117)
ALT: 12 U/L (ref 0–53)
ANION GAP: 11 (ref 5–15)
AST: 16 U/L (ref 0–37)
Albumin: 3.2 g/dL — ABNORMAL LOW (ref 3.5–5.2)
BILIRUBIN TOTAL: 0.7 mg/dL (ref 0.3–1.2)
BUN: 38 mg/dL — AB (ref 6–23)
CHLORIDE: 107 meq/L (ref 96–112)
CO2: 28 mEq/L (ref 19–32)
Calcium: 8.8 mg/dL (ref 8.4–10.5)
Creatinine, Ser: 1.21 mg/dL (ref 0.50–1.35)
GFR calc non Af Amer: 58 mL/min — ABNORMAL LOW (ref >90)
GFR, EST AFRICAN AMERICAN: 67 mL/min — AB (ref >90)
Glucose, Bld: 101 mg/dL — ABNORMAL HIGH (ref 70–99)
POTASSIUM: 3.5 meq/L — AB (ref 3.7–5.3)
Sodium: 146 mEq/L (ref 137–147)
Total Protein: 6.3 g/dL (ref 6.0–8.3)

## 2014-02-03 LAB — VITAMIN B12: VITAMIN B 12: 593 pg/mL (ref 211–911)

## 2014-02-03 MED ORDER — RESOURCE THICKENUP CLEAR PO POWD: 1.0000 g | ORAL | Status: AC | PRN

## 2014-02-03 MED ORDER — POTASSIUM CHLORIDE CRYS ER 20 MEQ PO TBCR
40.0000 meq | EXTENDED_RELEASE_TABLET | Freq: Once | ORAL | Status: AC
  Administered 2014-02-03: 40 meq via ORAL
  Filled 2014-02-03: qty 2

## 2014-02-03 MED ORDER — LABETALOL HCL 100 MG PO TABS: 100.0000 mg | ORAL_TABLET | Freq: Two times a day (BID) | ORAL | Status: DC

## 2014-02-03 NOTE — Progress Notes (Signed)
SLP Cancellation Note  Patient Details Name: RAMI BUDHU MRN: [id number] DOB: 1940-09-22   Cancelled treatment:       Reason Eval/Treat Not Completed: Other (comment): pt working with another provider. Will return as able to assess diet tolerance.    [name], M.A. CCC-SLP [phone]  [name] 02/03/2014, 11:43 AM

## 2014-02-03 NOTE — Discharge Instructions (Signed)
Please ensure he is taking his synthroid.  Follow up with PCP in 1 week for TSH and BP management.  BP medications have been changed.  Clonidine elmininated & Labetalol has been decreased.  Diet recommendation:  Pureed with Pudding thick liquids.  Recommend repeat swallow eval in the next 1 week.  You are no longer permitted to drive.

## 2014-02-03 NOTE — Clinical Social Work Psychosocial (Signed)
Clinical Social Work Department BRIEF PSYCHOSOCIAL ASSESSMENT 02/03/2014  Patient:  Benjamin Park, Benjamin Park     Account Number:  [id number]     Admit date:  01/31/2014  Clinical Social Worker:  Benjamin Park  Date/Time:  02/03/2014 12:30 PM  Referred by:  Physician  Date Referred:  02/03/2014 Referred for  SNF Placement   Other Referral:   NA   Interview type:  Family Other interview type:   Patient and wife interviewed at bedside to complete assessment.    PSYCHOSOCIAL DATA Living Status:  WIFE Admitted from facility:   Level of care:   Primary support name:  Benjamin Park Primary support relationship to patient:  SPOUSE Degree of support available:   Support is good.    CURRENT CONCERNS Current Concerns  Post-Acute Placement   Other Concerns:   NA    SOCIAL WORK ASSESSMENT / PLAN CSW received notification from PA that the patient is requirring SNF placement and the patient and wife would like [facility]. CSW met with patient and wife at bedside to complete assessment. Wife states that the patient lives with her at home and has been managing well with his ADL's at home but agrees that he could benefit from SNF placement. Per Benjamin Park, the patient has been to [facility] in the past and signed himself out after 16 hours because he didn't like it there. Benjamin Park states that she will only consider SNF placement at [facility]. CSW explained SNF search/placement process and answered questions. CSW explained that if authorization is not obtained by [facility] place by time of discharge the patient will DC home with HHPT as she is not willing to consider LOG placement or any other facilities. Benjamin Park states that she is a Quarry manager at [employer] and feels she could manage at home if the patient does end up going home.    UPDATE: CSW has received notification from [facility] that the facility will not be offering the patient a bed. CSW updated patient's wife. Wife Benjamin Park demanded a  reason for [facility] not offering a bed. CSW explained that the facility is "unable to meet the patient's needs." Benjamin Park contacted the facility and was told that the patient was ambulating 150 Ft and that this is why they would not offer a bed. Benjamin Park was upset as she believes the patient did not get a bed due to PT's documentation as she believes the documentation states the patient is walking 150FT without assistance. CSW explained that the PT's evaluation does not reflect this and that it in fact reflects that the patient needs assistance with ambulation. Benjamin Park still seemed unhappy with [facility] not offering a bed. CSW informed Benjamin Park of the other SNF offers received and she stated, "I don't like those places, he's not going there." CSW explained that if she does not want to consider the facilities offered that the [facility] can assist with home needs. Benjamin Park states that she would like information on appealing the discharge. CSW has informed RNCM of the above.   Assessment/plan status:  No Further Intervention Required Other assessment/ plan:   Complete Fl2, Fax, PASRR   Information/referral to community resources:   CSW contact information given.    PATIENT'S/FAMILY'S RESPONSE TO PLAN OF CARE: Patient's wife only willing to consider SNF placement at [facility]. [name]. CSW signing off at this time as Benjamin Park plans to take patient home with HHPT.       Benjamin Park MSW, LCSWA, [place],  [phone]

## 2014-02-03 NOTE — Evaluation (Signed)
Occupational Therapy Evaluation Patient Details Name: Benjamin Park MRN: [id number] DOB: 1940/08/14 Today's Date: 02/03/2014    History of Present Illness Benjamin Park  is a 73 y.o. male, past medical history significant for hypertension, subarachnoid hemorrhage in the past, TIA and episodes of confusion who was brought by his family today for evaluation after he was found unresponsive in his car.  He was admitted for acute encephalopathy.   Clinical Impression   Pt s/p above. Pt independent with ADLs, PTA. Feel pt will benefit from acute OT to increase independence with BADLs and safety prior to d/c. Recommending SNF for d/c.    Follow Up Recommendations  SNF;Supervision/Assistance - 24 hour    Equipment Recommendations  Other (comment) (tbd)    Recommendations for Other Services       Precautions / Restrictions Precautions Precautions: Fall Restrictions Weight Bearing Restrictions: No      Mobility Bed Mobility Overal bed mobility: Modified Independent                Transfers Overall transfer level: Needs assistance Equipment used: Rolling walker (2 wheeled) Transfers: Sit to/from Stand Sit to Stand: Min assist;Min guard       General transfer comment: cues for technique. cues/assist for safe technique for stand to sit transfer.    Balance    h/o falls                                        ADL Overall ADL's : Needs assistance/impaired Eating/Feeding: Set up;Supervision/ safety;Sitting   Grooming: Min guard;Standing   Upper Body Bathing: Min guard;Standing   Lower Body Bathing: Min guard;Sit to/from stand   Upper Body Dressing : Set up;Supervision/safety;Sitting   Lower Body Dressing: Min guard;Sit to/from stand   Toilet Transfer: Moderate assistance;Ambulation;RW (bed)           Functional mobility during ADLs: Moderate assistance;Rolling walker General ADL Comments: Performed ADL tasks at sink. Pt able to donn/doff  socks sitting EOB. Spoke with spouse about recommending 24/7 supervision and reasoning for this. Discussed recommending HHOT if pt does not go to SNF. Cues for foot placement at sink.      Vision                     Perception     Praxis      Pertinent Vitals/Pain Pain Assessment: No/denies pain     Hand Dominance     Extremity/Trunk Assessment Upper Extremity Assessment Upper Extremity Assessment: Generalized weakness   Lower Extremity Assessment Lower Extremity Assessment: Defer to PT evaluation       Communication Communication Communication: HOH   Cognition Arousal/Alertness: Awake/alert Behavior During Therapy: WFL for tasks assessed/performed Overall Cognitive Status: Impaired/Different from baseline Area of Impairment: Safety/judgement;Problem solving     Memory: Decreased recall of precautions   Safety/Judgement: Decreased awareness of safety   Problem Solving: Slow processing     General Comments       Exercises       Shoulder Instructions      Home Living Family/patient expects to be discharged to:: Private residence Living Arrangements: Spouse/significant other Available Help at Discharge: Family;Available PRN/intermittently (wife works second shift) Type of Home: House Home Access: Stairs to enter [employer] of Steps: 5 Entrance Stairs-Rails: None Home Layout: One level;Laundry or work area in basement  Home Equipment: [place] - 2 wheels;Cane - single point          Prior Functioning/Environment Level of Independence: Independent        Comments: frequent falls    OT Diagnosis: Generalized weakness;Altered mental status   OT Problem List: Decreased strength;Impaired balance (sitting and/or standing);Decreased knowledge of use of DME or AE;Decreased knowledge of precautions;Decreased cognition;Decreased safety awareness   OT Treatment/Interventions: Self-care/ADL training;DME and/or AE  instruction;Therapeutic activities;Patient/family education;Balance training;Therapeutic exercise;Cognitive remediation/compensation    OT Goals(Current goals can be found in the care plan section) Acute Rehab OT Goals Patient Stated Goal: not stated OT Goal Formulation: With patient/family Time For Goal Achievement: 02/10/14 Potential to Achieve Goals: Good ADL Goals Pt Will Perform Grooming: standing;with set-up;with supervision Pt Will Perform Lower Body Dressing: with set-up;sit to/from stand;with supervision Pt Will Transfer to Toilet: with supervision;ambulating Pt Will Perform Toileting - Clothing Manipulation and hygiene: sit to/from stand;with supervision  OT Frequency: Min 2X/week   Barriers to D/C:            Co-evaluation              End of Session Equipment Utilized During Treatment: Gait belt;Rolling walker  Activity Tolerance: Patient tolerated treatment well Patient left: in bed;with call bell/phone within reach;with bed alarm set;with family/visitor present   Time: 3244-0102 OT Time Calculation (min): 23 min Charges:  OT General Charges $OT Visit: 1 Procedure OT Evaluation $Initial OT Evaluation Tier I: 1 Procedure OT Treatments $Self Care/Home Management : 8-22 mins G-CodesBenito Park OTR/L [phone] 02/03/2014, 1:15 PM

## 2014-02-03 NOTE — Progress Notes (Signed)
Subjective: Patient states he feels improved.  He acknowledges he was confused when he came in. He is alert, oriented and in no distress.   Objective: Current vital signs: BP 106/74 mmHg  Pulse 74  Temp(Src) 97.8 F (36.6 C) (Oral)  Resp 20  Ht 6' (1.829 m)  Wt 71.8 kg (158 lb 4.6 oz)  BMI 21.46 kg/m2  SpO2 100% Vital signs in last 24 hours: Temp:  [97.5 F (36.4 C)-99.7 F (37.6 C)] 97.8 F (36.6 C) (11/17 0437) Pulse Rate:  [70-106] 74 (11/17 0437) Resp:  [16-20] 20 (11/17 0437) BP: (96-161)/(73-96) 106/74 mmHg (11/17 0437) SpO2:  [97 %-100 %] 100 % (11/17 0437)  Intake/Output from previous day: 11/16 0701 - 11/17 0700 In: 1842.5 [P.O.:10; I.V.:1832.5] Out: 150 [Urine:150] Intake/Output this shift:   Nutritional status: DIET - DYS 1  Neurologic Exam: Gen: In bed, NAD MS: awake, alert, oriented to year,novemeber and slow to respond to president but then states Obama, follow commands, and answer simple questions.  ID:POEUM, EOMI, VFF, face symmetric Motor: 5/5 throughout Sensory:intact to LT.    Lab Results: Basic Metabolic Panel:  Recent Labs Lab 01/31/14 1749 01/31/14 1808 02/01/14 0927 02/02/14 0600 02/03/14 0642  NA 142 143 137 141 146  K 4.4 4.2 3.3* 3.4* 3.5*  CL 101 103 97 101 107  CO2 28  --  25 26 28   GLUCOSE 129* 132* 161* 117* 101*  BUN 25* 26* 23 20 38*  CREATININE 1.12 1.20 1.03 0.83 1.21  CALCIUM 9.6  --  9.0 9.0 8.8    Liver Function Tests:  Recent Labs Lab 01/31/14 1749 02/02/14 0600 02/03/14 0642  AST 25 24 16   ALT 14 14 12   ALKPHOS 72 68 59  BILITOT 0.4 0.7 0.7  PROT 7.4 7.0 6.3  ALBUMIN 3.9 3.6 3.2*   No results for input(s): LIPASE, AMYLASE in the last 168 hours.  Recent Labs Lab 01/31/14 2220  AMMONIA 19    CBC:  Recent Labs Lab 01/31/14 1749 01/31/14 1808 02/02/14 0600 02/03/14 0642  WBC 9.9  --  15.0* 9.5  NEUTROABS 8.3*  --  13.5*  --   HGB 15.5 16.3 16.3 14.9  HCT 46.0 48.0 47.6 45.2  MCV 100.2*   --  98.1 101.1*  PLT 171  --  166 159    Cardiac Enzymes:  Recent Labs Lab 01/31/14 2220 02/01/14 0413 02/01/14 0927  TROPONINI <0.30 <0.30 <0.30    Lipid Panel:  Recent Labs Lab 02/01/14 0413  CHOL 179  TRIG 90  HDL 44  CHOLHDL 4.1  VLDL 18  LDLCALC 117*    CBG:  Recent Labs Lab 01/31/14 1751  GLUCAP 129*    Microbiology: Results for orders placed or performed during the hospital encounter of 01/31/14  MRSA PCR Screening     Status: None   Collection Time: 01/31/14  9:51 PM  Result Value Ref Range Status   MRSA by PCR NEGATIVE NEGATIVE Final    Comment:        The GeneXpert MRSA Assay (FDA approved for NASAL specimens only), is one component of a comprehensive MRSA colonization surveillance program. It is not intended to diagnose MRSA infection nor to guide or monitor treatment for MRSA infections.     Coagulation Studies:  Recent Labs  01/31/14 1749  LABPROT 14.2  INR 1.08    Imaging: Mr Brain Wo Contrast  02/01/2014   CLINICAL DATA:  Altered mental status and confusion, acute in onset yesterday.  EXAM: MRI  HEAD WITHOUT CONTRAST  MRA HEAD WITHOUT CONTRAST  TECHNIQUE: Multiplanar, multiecho pulse sequences of the brain and surrounding structures were obtained without intravenous contrast. Angiographic images of the head were obtained using MRA technique without contrast.  COMPARISON:  Head CT 01/31/2014, MRI 11/12/2012, and MRA 10/04/2010  FINDINGS: MRI HEAD FINDINGS  Images are moderately degraded by motion artifact. Abnormal diffusion weighted signal and T2 hyperintensity in the corpus callosum, middle cerebellar peduncles, and scattered throughout the bilateral frontal and parietal white matter does not appear significantly changed from the prior study. No definite acute infarct is identified. Patchy T2 hyperintensities elsewhere in the cerebral white matter bilaterally do not appear significantly changed from the prior MRI and are nonspecific but  compatible with moderate chronic small vessel ischemic disease. Advanced cerebral and cerebellar atrophy is unchanged. No intracranial hemorrhage, mass, midline shift, or extra-axial fluid collection is seen.  Orbits are unremarkable. There is left greater the right maxillary sinus polypoid mucosal thickening. Mastoid air cells are clear. Major intracranial vascular flow voids are grossly preserved.  MRA HEAD FINDINGS  Images are moderately to severely degraded by motion artifact, limiting branch vessel evaluation. Visualized distal vertebral arteries are patent with the left being slightly larger than the right. SCA origins appear patent. Basilar artery is patent without evidence of significant stenosis. PCAs are patent without evidence of high-grade stenosis. There may be mild narrowing of the left PCA near the P1-P2 junction.  Internal carotid arteries are patent from skullbase to carotid termini without gross stenosis. M1 segments are patent without gross stenosis. MCA branch vessel evaluation is limited by motion. Right A1 segment appears hypoplastic. Left A1 segment is grossly unremarkable. Left A2 segment is dominant and patent.  IMPRESSION: 1. No acute intracranial abnormality identified. 2. Unchanged, advanced cerebral atrophy and chronic small vessel ischemic disease. Chronic signal changes in the corpus callosum and middle cerebellar peduncles are also unchanged. 3. Motion degraded MRA. No evidence of major intracranial arterial occlusion or high-grade proximal stenosis.   Electronically Signed   By: Logan Bores   On: 02/01/2014 12:17   Dg Chest Port 1 View  02/02/2014   CLINICAL DATA:  Hypertension  EXAM: PORTABLE CHEST - 1 VIEW  COMPARISON:  12/22/2013, 01/31/2014, 11/12/2012, 04/12/2012  FINDINGS: There is bilateral chronic interstitial thickening. There slightly more increased interstitial markings in the left perihilar region which is similar to 04/09/2012. There is no focal consolidation,  pleural effusion, or pneumothorax. The heart and mediastinal contours are unremarkable.  The osseous structures are unremarkable.  IMPRESSION: Bilateral chronic interstitial thickening.  No new active cardiopulmonary disease.   Electronically Signed   By: Kathreen Devoid   On: 02/02/2014 08:17   Dg Swallowing Func-speech Pathology  02/02/2014   [name], CCC-SLP     02/02/2014  1:44 PM Objective Swallowing Evaluation:    Patient Details  Name: Benjamin Park MRN: [id number] Date of Birth: May 20, 1940  Today's Date: 02/02/2014 Time:  -     Past Medical History:  Past Medical History  Diagnosis Date  . Hx of transient ischemic attack (TIA)   . Hyperlipidemia   . ED (erectile dysfunction)   . Personal history of kidney stones   . Subarachnoid hemorrhage     '11- hospitalized -3 to 4 days(passed out-awoke in hospital)-  no residual affects.  . Myocardial infarction     12/1991  . Dysphagia   . Hypokalemia   . Hypothyroidism   . Sleep apnea     on CPAP-settings  6  . History of kidney stones 02-27-13    x5- none recent  . Throat cancer     '00-radiation therapy  . Stroke   . Unspecified cerebral artery occlusion with cerebral infarction  11/08/2012  . Episodic confusion 02-27-13    hx. of, not currently(multiple episodes- ? TIA's)-always saw MD  to be evaluated.  Marland Kitchen AKI (acute kidney injury) 02/23/2011    pt. denies any problems 02-27-13  . Hypertension     hx. labile - with spikes every 3-6 mos.-last 8'14 ED visit.  Marland Kitchen Hypertensive encephalopathy   . Headache(784.0)     episodic with periods of confusion -multiple  . Neuromuscular disorder     "parkinson's" -no tremor-sees neurology MD at [facility]  . Coronary artery disease     Dr. [place]   Past Surgical History:  Past Surgical History  Procedure Laterality Date  . Angioplasty  1993    LAD  . Cardiac catheterization    . Coronary angioplasty    . Basal cell cancer      head  . Hernia repair    . Appendectomy    . Patent foramen ovale closure  2008  . Eye surgery     . Colonoscopy N/A 06/18/2012    Procedure: COLONOSCOPY;  Surgeon: Jeryl Columbia, MD;  Location:  [facility] ENDOSCOPY;  Service: Endoscopy;  Laterality: N/A;  h&p in  file-hope   . Hot hemostasis N/A 06/18/2012    Procedure: HOT HEMOSTASIS (ARGON PLASMA COAGULATION/BICAP);   Surgeon: Jeryl Columbia, MD;  Location: [facility] ENDOSCOPY;  Service:  Endoscopy;  Laterality: N/A;  . Colonoscopy with propofol N/A 03/07/2013    Procedure: COLONOSCOPY WITH PROPOFOL;  Surgeon: Jeryl Columbia,  MD;  Location: [facility] ENDOSCOPY;  Service: Endoscopy;  Laterality:  N/A;   HPI:  73 y.o. male with PMH:  hypertension, subarachnoid hemorrhage  2011, MI, throat cancer 2000 radiation tx, Parkinson's, TIA  admitted after being found unresponsive in his car. MBS 8/14 with  recommendations Dys 1 and pudding thick liquids.  Follow up  treatment 4 days later recommended diet upgrade to regular and  nectar thick liquids (pt. reported to SLP he "usually did not use  his thickener at home").  CXR no new active cardiopulmonary  disease.     Assessment / Plan / Recommendation Clinical Impression  Clinical impression: Pt. exhibited mild oral and mild-moderate  pharyngeal phase dysphagia characterized by reduced tongue base  retraction and decreased laryngeal elevation resulting in silent  aspiration with honey thick liquids.  Pharyngeal residue overall  mod-max with incomplete clearing given verbal cues for second  swallows (difficulty initiating dry swallow).  Cervical spine  appears somewhat kyphotic and may be preventing a coordinated  swallow.  Recommend Dys 1 pudding thick liquids.  Pt. has history  of dysphagia and was able to decrease liquid viscosity during  previous admission 8/14.  ST will continue to follow and treat.        Treatment Recommendation  Therapy as outlined in treatment plan below    Diet Recommendation Dysphagia 1 (Puree);Pudding-thick liquid   Liquid Administration via: Spoon Medication Administration: Crushed with puree Supervision: Patient able to  self feed;Full supervision/cueing  for compensatory strategies Compensations: Slow rate;Small sips/bites;Multiple dry swallows  after each bite/sip Postural Changes and/or Swallow Maneuvers: Seated upright 90  degrees    Other  Recommendations Oral Care Recommendations: Oral care BID   Follow Up Recommendations   (TBD)    Frequency and Duration min 2x/week  2 weeks   Pertinent Vitals/Pain No pain            Reason for Referral Objectively evaluate swallowing function   Oral Phase     Pharyngeal Phase Pharyngeal - Honey Pharyngeal - Honey Teaspoon: Pharyngeal residue -  valleculae;Pharyngeal residue - pyriform sinuses;Reduced tongue  base retraction;Reduced laryngeal  elevation;Penetration/Aspiration during swallow;Reduced  airway/laryngeal closure Penetration/Aspiration details (honey teaspoon): Material enters  airway, passes BELOW cords without attempt by patient to eject  out (silent aspiration) Pharyngeal - Solids Pharyngeal - Puree: Pharyngeal residue - pyriform  sinuses;Pharyngeal residue - valleculae;Reduced tongue base  retraction;Reduced laryngeal elevation  Cervical Esophageal Phase    GO              [name] 02/02/2014, 1:44 PM  [name] Colvin Caroli.Ed CCC-SLP Pager [phone]     Mr Jodene Nam Head/brain Wo Cm  02/01/2014   CLINICAL DATA:  Altered mental status and confusion, acute in onset yesterday.  EXAM: MRI HEAD WITHOUT CONTRAST  MRA HEAD WITHOUT CONTRAST  TECHNIQUE: Multiplanar, multiecho pulse sequences of the brain and surrounding structures were obtained without intravenous contrast. Angiographic images of the head were obtained using MRA technique without contrast.  COMPARISON:  Head CT 01/31/2014, MRI 11/12/2012, and MRA 10/04/2010  FINDINGS: MRI HEAD FINDINGS  Images are moderately degraded by motion artifact. Abnormal diffusion weighted signal and T2 hyperintensity in the corpus callosum, middle cerebellar peduncles, and scattered throughout the bilateral frontal and parietal white  matter does not appear significantly changed from the prior study. No definite acute infarct is identified. Patchy T2 hyperintensities elsewhere in the cerebral white matter bilaterally do not appear significantly changed from the prior MRI and are nonspecific but compatible with moderate chronic small vessel ischemic disease. Advanced cerebral and cerebellar atrophy is unchanged. No intracranial hemorrhage, mass, midline shift, or extra-axial fluid collection is seen.  Orbits are unremarkable. There is left greater the right maxillary sinus polypoid mucosal thickening. Mastoid air cells are clear. Major intracranial vascular flow voids are grossly preserved.  MRA HEAD FINDINGS  Images are moderately to severely degraded by motion artifact, limiting branch vessel evaluation. Visualized distal vertebral arteries are patent with the left being slightly larger than the right. SCA origins appear patent. Basilar artery is patent without evidence of significant stenosis. PCAs are patent without evidence of high-grade stenosis. There may be mild narrowing of the left PCA near the P1-P2 junction.  Internal carotid arteries are patent from skullbase to carotid termini without gross stenosis. M1 segments are patent without gross stenosis. MCA branch vessel evaluation is limited by motion. Right A1 segment appears hypoplastic. Left A1 segment is grossly unremarkable. Left A2 segment is dominant and patent.  IMPRESSION: 1. No acute intracranial abnormality identified. 2. Unchanged, advanced cerebral atrophy and chronic small vessel ischemic disease. Chronic signal changes in the corpus callosum and middle cerebellar peduncles are also unchanged. 3. Motion degraded MRA. No evidence of major intracranial arterial occlusion or high-grade proximal stenosis.   Electronically Signed   By: Logan Bores   On: 02/01/2014 12:17    Medications:  Scheduled: . antiseptic oral rinse  7 mL Mouth Rinse q12n4p  . aspirin  300 mg Rectal  Daily   Or  . aspirin  325 mg Oral Daily  . atorvastatin  10 mg Oral q1800  . chlorhexidine  15 mL Mouth Rinse BID  . enoxaparin (LOVENOX) injection  40 mg Subcutaneous Q24H  . labetalol  100 mg Oral BID  . levothyroxine  100 mcg Oral QAC breakfast    Assessment/Plan:  73 yo M with presentation with hypertensive emergency and acute encephalopathy. I suspect that this is related to hypertensive encephalopathy given the normal MRI and gradual improvement now that his BP is under better control.   Recommendations: 1) Continue BP control  Neurology  S/O    Etta Quill PA-C [facility] [phone]  02/03/2014, 9:31 AM

## 2014-02-03 NOTE — Care Management (Signed)
CARE MANAGEMENT NOTE 02/03/2014  Patient:  Benjamin Park, Benjamin Park   Account Number:  [id number]  Date Initiated:  02/02/2014  Documentation initiated by:  [facility]  Subjective/Objective Assessment:   HTN Encephalopathy     Action/Plan:   home with wife   Anticipated DC Date:  02/03/2014   Anticipated DC Plan:  [place]  CM consult      [facility] Choice  HOME HEALTH   Choice offered to / List presented to:  C-4 Adult Children        HH arranged  HH-1 RN      [place].   Status of service:  In process, will continue to follow Medicare Important Message given?  YES (If response is "NO", the following Medicare IM given date fields will be blank) Date Medicare IM given:  02/03/2014 Medicare IM given by:  Marylyn Ishihara Date Additional Medicare IM given:   Additional Medicare IM given by:    Discharge Disposition:  [place]  Per UR Regulation:  Reviewed for med. necessity/level of care/duration of stay  If discussed at [place] of Stay Meetings, dates discussed:    Comments:  02/02/2014 1000 UR complete. Chart reviewed. Jonnie Finner RN CCM Case Mgmt  02/03/2014  1605  SW offered patient Dispensing optician for Rehab but patient's daughter refused. NCM spoke with daughter and they agreed to discharge home with [facility] for RN, CSW, ST, PT with [place].  NCM set up RN, CSW, ST, [place] with [place] for patient's discharge today respectively. SOC will begin 24-48 hours post discharge.

## 2014-02-03 NOTE — Progress Notes (Signed)
  [facility] Progress Note  02/03/2014 11:16 AM  Doing well today. Alert and oriented. Was seen ambulating in halls with PT. Repeat carotid duplex revealed L ICA stenosis of 1-39%. Right 60-79%. MRI and CT scans negative for stroke. His encephalopathy unlikely related to his carotid disease. Will need routine carotid duplexes as outpatient. We'll follow along.   Virgina Jock, PA-C [facility] Office: [phone] Pager: [phone] 02/03/2014 11:16 AM

## 2014-02-03 NOTE — Progress Notes (Signed)
OT Cancellation Note  Patient Details Name: Benjamin Park MRN: [id number] DOB: 03-Dec-1940   Cancelled Treatment:    Reason Eval/Treat Not Completed: Patient not medically ready. Pt continues to be on bedrest.   [name] OTR/L [phone]  02/03/2014, 8:30 AM

## 2014-02-03 NOTE — Progress Notes (Signed)
Speech Language Pathology Treatment: Dysphagia  Patient Details Name: Benjamin Park MRN: [id number] DOB: 03/06/1941 Today's Date: 02/03/2014 Time: 5188-4166 SLP Time Calculation (min) (ACUTE ONLY): 13 min  Assessment / Plan / Recommendation Clinical Impression  Pt required Max cues and skilled education from SLP to recall results of MBS from previous date, including recommendations for diet and compensatory strategies. However, pt utilized compensatory strategies during PO intake with Dys 1 textures with Min multimodal cueing provided by therapist. Pt verbalized his understanding of the recommendations from SLP, however will benefit from continued [facility] SLP services to monitor tolerance and continue to work toward advancement of diet with improved oropharyngeal function.   HPI HPI: 73 y.o. male with PMH:  hypertension, subarachnoid hemorrhage 2011, MI, throat cancer 2000 radiation tx, Parkinson's, TIA admitted after being found unresponsive in his car. MBS 8/14 with recommendations Dys 1 and pudding thick liquids.  Follow up treatment 4 days later recommended diet upgrade to regular and nectar thick liquids (pt. reported to SLP he "usually did not use his thickener at home").  CXR no new active cardiopulmonary disease.   Pertinent Vitals Pain Assessment: No/denies pain  SLP Plan  Continue with current plan of care    Recommendations Diet recommendations: Dysphagia 1 (puree);Pudding-thick liquid Liquids provided via: Teaspoon Medication Administration: Crushed with puree Supervision: Patient able to self feed;Full supervision/cueing for compensatory strategies Compensations: Slow rate;Small sips/bites;Multiple dry swallows after each bite/sip Postural Changes and/or Swallow Maneuvers: Seated upright 90 degrees              Oral Care Recommendations: Oral care BID Follow up Recommendations: Home health SLP Plan: Continue with current plan of care    GO      Benjamin Park, M.A.  CCC-SLP [phone]  Benjamin Park 02/03/2014, 3:48 PM

## 2014-02-03 NOTE — Discharge Summary (Signed)
Physician Discharge Summary  Benjamin Park YQM:[id number] DOB: 04/17/1940 DOA: 01/31/2014  PCP: Myriam Jacobson, MD  Admit date: 01/31/2014 Discharge date: 02/03/2014  Time spent: 45 minutes  Recommendations for Outpatient Follow-up:  1. PCP follow up for elevated TSH, and close BP control 2. SNF recommended, family politely refused.  HH PT / SW ordered 3. BP medications have been reduced.  Clonidine eliminated. 4. [facility] for swallowing and diet recommendations.  Discharge Diagnoses:  Principal Problem:   Encephalopathy, hypertensive Active Problems:   Hyperlipidemia   Altered mental status   Accelerated hypertension   Leukocytosis   Discharge Condition: stable.  Diet recommendation: Dysphagia 1 with pudding thick liquids  (will request re-eval prior to d/c)  [facility]   01/31/14 2129 02/01/14 2155  Weight: 71.1 kg (156 lb 12 oz) 71.8 kg (158 lb 4.6 oz)    History of present illness:  73 year old male with history of hypertension, subarachnoid hemorrhage, hyperlipidemia, coronary artery disease presented with acute confusion. Apparently, the patient was supposed to take his daughter to lunch on day of admission. When he was late, the patient's son was called later found the patient confused and minimally responsive in his car. EMS was activated. The patient was found to have significant elevation of his blood pressure in the emergency department not responsive to hydralazine or labetalol. The patient was admitted to ICU and placed on a Cardene drip. His Cardene drip was ultimately weaned off and the patient remained clinically stable to be transferred to the medical floor. Neurology was consulted and a full stroke workup was undertaken.  Hospital Course:  Acute encephalopathy Secondary to hypertensive encephalopathy. On 11/17 the patient is pleasant and almost at baseline per wife.  Ammonia--19; B12 (in process); TSH--8.520; MRI Brain--neg for stroke,  bleed, mass; MRA brain--neg for major arterial stenosis; UDS neg; Evaluated by Neurology who believes his symptoms were due to HTN. -patient was at his baseline mentation on the day of discharge  Dysphagia Speech eval-->dysphagia 1 with pudding thickened liquids based on MBSS 02/02/2014.  Will send home with SLP.  Carotid Stenosis Vascular consulted. Appreciate Dr. Luther Parody recommendations.  Carotid duplex results below. Rt. ICA stenosed at 60 - 79%.  No further action at this time.  Leukocytosis UA neg for pyuria.  CXR without infiltrate.  Did not start abx as pt is afebrile and hemodynamically stable.  Malignant HTN Restarted normodyne at lower dose 100mg  bid and monitor.  Avoid clonidine due to rebound HTN.  Patient known to have labile BP and can easily become hypotensive.  PCP follow up. -patient's blood pressure remained acceptable and controlled on Normodyne 100 mg twice a day  Hyperlipidemia LDL 117.  Restarted statin on 11/16  Hypokalemia Magnesium 2.4.  Potassium repleted.    Hypothyroidism TSH 8.520.  Unclear if the patient was taking Synthroid properly.  Restart home dose.  PCP follow up. -repeat TSH in 4-6 weeks  Recent Falls Per wife patient falls approximately 5 times a day. Unfortunately he is home alone for periods of time while she works.  PT recommends SNF. Family opt for [facility] if possible. Unfortunately [facility] did not have a bed available for him so he will discharge to home.    Consultations:  Vascular Surgery Neurology  Procedures/Studies:  2D Echo Study Conclusions  - Left ventricle: The cavity size was normal. Wall thickness was normal. Systolic function was vigorous. The estimated ejection fraction was in the range of 65% to 70%. Wall motion was  normal; there were no regional wall motion abnormalities.  Carotid Duplex Left Carotid Duplex (Doppler) has been completed.  Study was technically difficult due to vessel  tortuosity, patient anatomy, and poor patient cooperation. Findings suggest 1-39% left internal carotid artery stenosis. The left vertebral artery is patent with antegrade flow. Bilateral carotid artery duplex attempted. Bilateral: Vertebral artery flow is antegrade. Right: 60-79% ICA stenosis. Left: Patient became agitated before I could evaluate the ICA.    Discharge Exam: Filed Vitals:   02/03/14 1258  BP: 114/78  Pulse: 72  Temp: 97.8 F (36.6 C)  Resp: 18    General: Pt is alert, follows commands appropriately, not in acute distress.  Slightly slow to respond.  HEENT: No icterus, No thrush, No meningismus, [place]/AT  Cardiovascular: RRR, S1/S2, no rubs, no gallops  Respiratory: CTA bilaterally, no wheezing, no crackles, no rhonchi  Abdomen: Soft/+BS, non tender, non distended, no guarding  Extremities: No edema, No lymphangitis, No petechiae, No rashes, no synovitis     Discharge Instructions    Diet - low sodium heart healthy    Complete by:  As directed   Pureed diet with thickened liquids     Increase activity slowly    Complete by:  As directed           Current Discharge Medication List    START taking these medications   Details  Maltodextrin-Xanthan Gum (RESOURCE THICKENUP CLEAR) POWD Take 1 g by mouth as needed. Qty: 1 Can, Refills: 6      CONTINUE these medications which have CHANGED   Details  labetalol (NORMODYNE) 100 MG tablet Take 1 tablet (100 mg total) by mouth 2 (two) times daily. Qty: 60 tablet, Refills: 3      CONTINUE these medications which have NOT CHANGED   Details  aspirin 325 MG tablet Take 325 mg by mouth daily.      fish oil-omega-3 fatty acids 1000 MG capsule Take 1 g by mouth daily.     GLUCOSAMINE HCL PO Take 1 tablet by mouth daily.    levothyroxine (SYNTHROID, LEVOTHROID) 100 MCG tablet Take 100 mcg by mouth at bedtime.     lovastatin (MEVACOR) 40 MG tablet Take 80 mg by mouth at bedtime.     Multiple Vitamin  (MULITIVITAMIN WITH MINERALS) TABS Take 1 tablet by mouth daily.      testosterone cypionate (DEPOTESTOTERONE CYPIONATE) 200 MG/ML injection Inject 1 mL (200 mg total) into the muscle every 28 (twenty-eight) days. Qty: 10 mL, Refills: 5      STOP taking these medications     cloNIDine (CATAPRES) 0.1 MG tablet        No Known Allergies Follow-up Information    Follow up with ROBERTS, [name], MD In 1 week.   Specialty:  Internal Medicine   Why:  Follow up for TSH and BP management   Contact information:   [place], [place] [address] [phone]        The results of significant diagnostics from this hospitalization (including imaging, microbiology, ancillary and laboratory) are listed below for reference.    Significant Diagnostic Studies: Ct Head Wo Contrast  01/31/2014   CLINICAL DATA:  Code stroke, history of old hemorrhagic stroke, altered mental status currently, initial evaluation  EXAM: CT HEAD WITHOUT CONTRAST  TECHNIQUE: Contiguous axial images were obtained from the base of the skull through the vertex without intravenous contrast.  COMPARISON:  11/12/2012, 11/11/2012  FINDINGS: Severe diffuse atrophy. Moderate low attenuation in the  deep white matter. No hydrocephalus. No hemorrhage or extra-axial fluid. No acute vascular territory infarct. It is retention cyst left maxillary sinus not significantly different from prior study. Calvarium intact.  IMPRESSION: No acute intracranial abnormalities. Critical Value/emergent results were called by telephone at the time of interpretation on 01/31/2014 at 6:23 pm to Dr. Doy Mince, who verbally acknowledged these results.   Electronically Signed   By: Skipper Cliche M.D.   On: 01/31/2014 18:24   Mr Brain Wo Contrast  02/01/2014   CLINICAL DATA:  Altered mental status and confusion, acute in onset yesterday.  EXAM: MRI HEAD WITHOUT CONTRAST  MRA HEAD WITHOUT CONTRAST  TECHNIQUE: Multiplanar, multiecho pulse  sequences of the brain and surrounding structures were obtained without intravenous contrast. Angiographic images of the head were obtained using MRA technique without contrast.  COMPARISON:  Head CT 01/31/2014, MRI 11/12/2012, and MRA 10/04/2010  FINDINGS: MRI HEAD FINDINGS  Images are moderately degraded by motion artifact. Abnormal diffusion weighted signal and T2 hyperintensity in the corpus callosum, middle cerebellar peduncles, and scattered throughout the bilateral frontal and parietal white matter does not appear significantly changed from the prior study. No definite acute infarct is identified. Patchy T2 hyperintensities elsewhere in the cerebral white matter bilaterally do not appear significantly changed from the prior MRI and are nonspecific but compatible with moderate chronic small vessel ischemic disease. Advanced cerebral and cerebellar atrophy is unchanged. No intracranial hemorrhage, mass, midline shift, or extra-axial fluid collection is seen.  Orbits are unremarkable. There is left greater the right maxillary sinus polypoid mucosal thickening. Mastoid air cells are clear. Major intracranial vascular flow voids are grossly preserved.  MRA HEAD FINDINGS  Images are moderately to severely degraded by motion artifact, limiting branch vessel evaluation. Visualized distal vertebral arteries are patent with the left being slightly larger than the right. SCA origins appear patent. Basilar artery is patent without evidence of significant stenosis. PCAs are patent without evidence of high-grade stenosis. There may be mild narrowing of the left PCA near the P1-P2 junction.  Internal carotid arteries are patent from skullbase to carotid termini without gross stenosis. M1 segments are patent without gross stenosis. MCA branch vessel evaluation is limited by motion. Right A1 segment appears hypoplastic. Left A1 segment is grossly unremarkable. Left A2 segment is dominant and patent.  IMPRESSION: 1. No acute  intracranial abnormality identified. 2. Unchanged, advanced cerebral atrophy and chronic small vessel ischemic disease. Chronic signal changes in the corpus callosum and middle cerebellar peduncles are also unchanged. 3. Motion degraded MRA. No evidence of major intracranial arterial occlusion or high-grade proximal stenosis.   Electronically Signed   By: Logan Bores   On: 02/01/2014 12:17   Dg Chest Port 1 View  02/02/2014   CLINICAL DATA:  Hypertension  EXAM: PORTABLE CHEST - 1 VIEW  COMPARISON:  12/22/2013, 01/31/2014, 11/12/2012, 04/12/2012  FINDINGS: There is bilateral chronic interstitial thickening. There slightly more increased interstitial markings in the left perihilar region which is similar to 04/09/2012. There is no focal consolidation, pleural effusion, or pneumothorax. The heart and mediastinal contours are unremarkable.  The osseous structures are unremarkable.  IMPRESSION: Bilateral chronic interstitial thickening.  No new active cardiopulmonary disease.   Electronically Signed   By: Kathreen Devoid   On: 02/02/2014 08:17   Dg Chest Port 1 View  01/31/2014   CLINICAL DATA:  Code stroke. History of hypertension, coronary artery disease, subarachnoid hemorrhage  EXAM: PORTABLE CHEST - 1 VIEW  COMPARISON:  11/14/2012  FINDINGS: There are  bilateral chronic thickened interstitial markings. There are sudden increased interstitial markings at the left lung base. There is no other focal parenchymal opacity, pleural effusion, or pneumothorax. The heart and mediastinal contours are unremarkable.  The osseous structures are unremarkable.  IMPRESSION: Bilateral chronic interstitial thickening. Slightly increased interstitial disease of the left lung base concerning for superimposed acute infectious or inflammatory etiology.   Electronically Signed   By: Kathreen Devoid   On: 01/31/2014 21:19   Dg Swallowing Func-speech Pathology  02/02/2014   [name], CCC-SLP     02/02/2014  1:44 PM Objective  Swallowing Evaluation:    Patient Details  Name: Benjamin Park MRN: [id number] Date of Birth: [birth year]  Today's Date: 02/02/2014 Time:  -     Past Medical History:  Past Medical History  Diagnosis Date  . Hx of transient ischemic attack (TIA)   . Hyperlipidemia   . ED (erectile dysfunction)   . Personal history of kidney stones   . Subarachnoid hemorrhage     '11- hospitalized -3 to 4 days(passed out-awoke in hospital)-  no residual affects.  . Myocardial infarction     12/1991  . Dysphagia   . Hypokalemia   . Hypothyroidism   . Sleep apnea     on CPAP-settings 6  . History of kidney stones 02-27-13    x5- none recent  . Throat cancer     '00-radiation therapy  . Stroke   . Unspecified cerebral artery occlusion with cerebral infarction  11/08/2012  . Episodic confusion 02-27-13    hx. of, not currently(multiple episodes- ? TIA's)-always saw MD  to be evaluated.  Marland Kitchen AKI (acute kidney injury) 02/23/2011    pt. denies any problems 02-27-13  . Hypertension     hx. labile - with spikes every 3-6 mos.-last 8'14 ED visit.  Marland Kitchen Hypertensive encephalopathy   . Headache(784.0)     episodic with periods of confusion -multiple  . Neuromuscular disorder     "parkinson's" -no tremor-sees neurology MD at [facility]  . Coronary artery disease     Dr. Martinique   Past Surgical History:  Past Surgical History  Procedure Laterality Date  . Angioplasty  1993    LAD  . Cardiac catheterization    . Coronary angioplasty    . Basal cell cancer      head  . Hernia repair    . Appendectomy    . Patent foramen ovale closure  2008  . Eye surgery    . Colonoscopy N/A 06/18/2012    Procedure: COLONOSCOPY;  Surgeon: Jeryl Columbia, MD;  Location[facility]  [facility] ENDOSCOPY;  Service: Endoscopy;  Laterality: N/A;  h&p in  file-hope   . Hot hemostasis N/A 06/18/2012    Procedure: HOT HEMOSTASIS (ARGON PLASMA COAGULATION/BICAP);   Surgeon: Jeryl Columbia, MD;  Location: [facility] ENDOSCOPY;  Service:  Endoscopy;  Laterality: N/A;  . Colonoscopy with propofol N/A 03/07/2013     Procedure: COLONOSCOPY WITH PROPOFOL;  Surgeon: Jeryl Columbia,  MD;  Location: [facility] ENDOSCOPY;  Service: Endoscopy;  Laterality:  N/A;   HPI:  73 y.o. male with PMH:  hypertension, subarachnoid hemorrhage  2011, MI, throat cancer 2000 radiation tx, Parkinson's, TIA  admitted after being found unresponsive in his car. MBS 8/14 with  recommendations Dys 1 and pudding thick liquids.  Follow up  treatment 4 days later recommended diet upgrade to regular and  nectar thick liquids (pt. reported to SLP he "usually did not use  his thickener at  home").  CXR no new active cardiopulmonary  disease.     Assessment / Plan / Recommendation Clinical Impression  Clinical impression: Pt. exhibited mild oral and mild-moderate  pharyngeal phase dysphagia characterized by reduced tongue base  retraction and decreased laryngeal elevation resulting in silent  aspiration with honey thick liquids.  Pharyngeal residue overall  mod-max with incomplete clearing given verbal cues for second  swallows (difficulty initiating dry swallow).  Cervical spine  appears somewhat kyphotic and may be preventing a coordinated  swallow.  Recommend Dys 1 pudding thick liquids.  Pt. has history  of dysphagia and was able to decrease liquid viscosity during  previous admission 8/14.  ST will continue to follow and treat.        Treatment Recommendation  Therapy as outlined in treatment plan below    Diet Recommendation Dysphagia 1 (Puree);Pudding-thick liquid   Liquid Administration via: Spoon Medication Administration: Crushed with puree Supervision: Patient able to self feed;Full supervision/cueing  for compensatory strategies Compensations: Slow rate;Small sips/bites;Multiple dry swallows  after each bite/sip Postural Changes and/or Swallow Maneuvers: Seated upright 90  degrees    Other  Recommendations Oral Care Recommendations: Oral care BID   Follow Up Recommendations   (TBD)    Frequency and Duration min 2x/week  2 weeks   Pertinent Vitals/Pain No pain             Reason for Referral Objectively evaluate swallowing function   Oral Phase     Pharyngeal Phase Pharyngeal - Honey Pharyngeal - Honey Teaspoon: Pharyngeal residue -  valleculae;Pharyngeal residue - pyriform sinuses;Reduced tongue  base retraction;Reduced laryngeal  elevation;Penetration/Aspiration during swallow;Reduced  airway/laryngeal closure Penetration/Aspiration details (honey teaspoon): Material enters  airway, passes BELOW cords without attempt by patient to eject  out (silent aspiration) Pharyngeal - Solids Pharyngeal - Puree: Pharyngeal residue - pyriform  sinuses;Pharyngeal residue - valleculae;Reduced tongue base  retraction;Reduced laryngeal elevation  Cervical Esophageal Phase    GO              [name] 02/02/2014, 1:44 PM  [name] Colvin Caroli.Ed CCC-SLP Pager [phone]     Mr Jodene Nam Head/brain Wo Cm  02/01/2014   CLINICAL DATA:  Altered mental status and confusion, acute in onset yesterday.  EXAM: MRI HEAD WITHOUT CONTRAST  MRA HEAD WITHOUT CONTRAST  TECHNIQUE: Multiplanar, multiecho pulse sequences of the brain and surrounding structures were obtained without intravenous contrast. Angiographic images of the head were obtained using MRA technique without contrast.  COMPARISON:  Head CT 01/31/2014, MRI 11/12/2012, and MRA 10/04/2010  FINDINGS: MRI HEAD FINDINGS  Images are moderately degraded by motion artifact. Abnormal diffusion weighted signal and T2 hyperintensity in the corpus callosum, middle cerebellar peduncles, and scattered throughout the bilateral frontal and parietal white matter does not appear significantly changed from the prior study. No definite acute infarct is identified. Patchy T2 hyperintensities elsewhere in the cerebral white matter bilaterally do not appear significantly changed from the prior MRI and are nonspecific but compatible with moderate chronic small vessel ischemic disease. Advanced cerebral and cerebellar atrophy is unchanged. No intracranial  hemorrhage, mass, midline shift, or extra-axial fluid collection is seen.  Orbits are unremarkable. There is left greater the right maxillary sinus polypoid mucosal thickening. Mastoid air cells are clear. Major intracranial vascular flow voids are grossly preserved.  MRA HEAD FINDINGS  Images are moderately to severely degraded by motion artifact, limiting branch vessel evaluation. Visualized distal vertebral arteries are patent with the left being slightly larger than the  right. SCA origins appear patent. Basilar artery is patent without evidence of significant stenosis. PCAs are patent without evidence of high-grade stenosis. There may be mild narrowing of the left PCA near the P1-P2 junction.  Internal carotid arteries are patent from skullbase to carotid termini without gross stenosis. M1 segments are patent without gross stenosis. MCA branch vessel evaluation is limited by motion. Right A1 segment appears hypoplastic. Left A1 segment is grossly unremarkable. Left A2 segment is dominant and patent.  IMPRESSION: 1. No acute intracranial abnormality identified. 2. Unchanged, advanced cerebral atrophy and chronic small vessel ischemic disease. Chronic signal changes in the corpus callosum and middle cerebellar peduncles are also unchanged. 3. Motion degraded MRA. No evidence of major intracranial arterial occlusion or high-grade proximal stenosis.   Electronically Signed   By: Logan Bores   On: 02/01/2014 12:17    Microbiology: Recent Results (from the past 240 hour(s))  MRSA PCR Screening     Status: None   Collection Time: 01/31/14  9:51 PM  Result Value Ref Range Status   MRSA by PCR NEGATIVE NEGATIVE Final    Comment:        The GeneXpert MRSA Assay (FDA approved for NASAL specimens only), is one component of a comprehensive MRSA colonization surveillance program. It is not intended to diagnose MRSA infection nor to guide or monitor treatment for MRSA infections.      Labs: Basic  Metabolic Panel:  Recent Labs Lab 01/31/14 1749 01/31/14 1808 02/01/14 0927 02/02/14 0600 02/03/14 0642  NA 142 143 137 141 146  K 4.4 4.2 3.3* 3.4* 3.5*  CL 101 103 97 101 107  CO2 28  --  25 26 28   GLUCOSE 129* 132* 161* 117* 101*  BUN 25* 26* 23 20 38*  CREATININE 1.12 1.20 1.03 0.83 1.21  CALCIUM 9.6  --  9.0 9.0 8.8   Liver Function Tests:  Recent Labs Lab 01/31/14 1749 02/02/14 0600 02/03/14 0642  AST 25 24 16   ALT 14 14 12   ALKPHOS 72 68 59  BILITOT 0.4 0.7 0.7  PROT 7.4 7.0 6.3  ALBUMIN 3.9 3.6 3.2*    Recent Labs Lab 01/31/14 2220  AMMONIA 19   CBC:  Recent Labs Lab 01/31/14 1749 01/31/14 1808 02/02/14 0600 02/03/14 0642  WBC 9.9  --  15.0* 9.5  NEUTROABS 8.3*  --  13.5*  --   HGB 15.5 16.3 16.3 14.9  HCT 46.0 48.0 47.6 45.2  MCV 100.2*  --  98.1 101.1*  PLT 171  --  166 159   Cardiac Enzymes:  Recent Labs Lab 01/31/14 2220 02/01/14 0413 02/01/14 0927  TROPONINI <0.30 <0.30 <0.30   CBG:  Recent Labs Lab 01/31/14 1751  GLUCAP 129*       Signed:  Karen Kitchens  [facility] 02/03/2014, 2:45 PM  Attending  Patient was seen, examined,treatment plan was discussed with the Physician extender. I have directly reviewed the clinical findings, lab, imaging studies and management of this patient in detail. I have made the necessary changes to the above noted documentation, and agree with the documentation, as recorded by the Physician extender.  Orson Eva, DO [phone]

## 2014-02-03 NOTE — Evaluation (Signed)
Physical Therapy Evaluation Patient Details Name: Benjamin Park MRN: [id number] DOB: [birth year] Today's Date: 02/03/2014   History of Present Illness  Benjamin Park  is a 73 y.o. male, past medical history significant for hypertension, subarachnoid hemorrhage in the past, TIA and episodes of confusion who was brought by his family today for evaluation after he was found unresponsive in his car.  He was admitted for acute encephalopathy.  Clinical Impression  Pt admitted with above. Pt currently with functional limitations due to the deficits listed below (see PT Problem List).  Pt will benefit from skilled PT to increase their independence and safety with mobility to allow discharge to the venue listed below. Recommending ST SNF for continued therapy.  Pt/wife unsure if they want this option. If not agreeable to SNF, recommend HHPT with 24-hour family assist.      Follow Up Recommendations SNF;Supervision/Assistance - 24 hour    Equipment Recommendations  None recommended by PT    Recommendations for Other Services       Precautions / Restrictions Precautions Precautions: Fall Restrictions Weight Bearing Restrictions: No      Mobility  Bed Mobility Overal bed mobility: Modified Independent                Transfers Overall transfer level: Needs assistance Equipment used: Rolling walker (2 wheeled) Transfers: Sit to/from [facility] Sit to Stand: Min assist Stand pivot transfers: Min assist       General transfer comment: assist to power up and stabilize balance with initial stance  Ambulation/Gait Ambulation/Gait assistance: Min assist Ambulation Distance (Feet): 150 Feet Assistive device: Rolling walker (2 wheeled) Gait Pattern/deviations: Trunk flexed;Scissoring;Steppage Gait velocity: WFL   General Gait Details: very unsteady gait; assist required for RW management.  Pt pushing the RW too far out in front with increased hip and trunk  flexion.  Stairs            Wheelchair Mobility    Modified Rankin (Stroke Patients Only)       Balance                                             Pertinent Vitals/Pain Pain Assessment: No/denies pain    Home Living Family/patient expects to be discharged to:: Private residence Living Arrangements: Spouse/significant other Available Help at Discharge: Family;Available PRN/intermittently (wife works 2nd shift) Type of Home: House Home Access: Stairs to enter Entrance Stairs-Rails: None Technical brewer of Steps: 5 Home Layout: One level;Laundry or work area in [place]: Environmental consultant - 2 wheels;Cane - single point      Prior Function Level of Independence: Independent         Comments: frequent falls     Hand Dominance        Extremity/Trunk Assessment   Upper Extremity Assessment: Generalized weakness           Lower Extremity Assessment: Generalized weakness         Communication   Communication: HOH  Cognition Arousal/Alertness: Awake/alert Behavior During Therapy: Flat affect Overall Cognitive Status: History of cognitive impairments - at baseline       Memory: Decreased short-term memory              General Comments      Exercises        Assessment/Plan    PT Assessment Patient needs continued PT services  PT Diagnosis Difficulty walking;Generalized weakness   PT Problem List Decreased strength;Decreased activity tolerance;Decreased balance;Decreased mobility;Decreased coordination;Decreased safety awareness;Decreased cognition  PT Treatment Interventions DME instruction;Gait training;Stair training;Functional mobility training;Therapeutic activities;Therapeutic exercise;Patient/family education;Cognitive remediation;Balance training   PT Goals (Current goals can be found in the Care Plan section) Acute Rehab PT Goals Patient Stated Goal: not stated PT Goal Formulation: With  patient/family Time For Goal Achievement: 02/17/14 Potential to Achieve Goals: Good    Frequency Min 3X/week   Barriers to discharge Decreased caregiver support      Co-evaluation               End of Session Equipment Utilized During Treatment: Gait belt Activity Tolerance: Patient tolerated treatment well Patient left: in bed;with call bell/phone within reach;with family/visitor present Nurse Communication: Mobility status         Time: 6384-6659 PT Time Calculation (min) (ACUTE ONLY): 26 min   Charges:   PT Evaluation $Initial PT Evaluation Tier I: 1 Procedure PT Treatments $Gait Training: 8-22 mins   PT G Codes:          Lorriane Shire 02/03/2014, 11:37 AM

## 2014-02-03 NOTE — Progress Notes (Signed)
PROGRESS NOTE  ISSAK GOLEY BPZ:[id number] DOB: Aug 09, 1940 DOA: 01/31/2014 PCP: Myriam Jacobson, MD  Brief history 73 year old male with history of hypertension, subarachnoid hemorrhage, hyperlipidemia, coronary artery disease presented with acute confusion.  Apparently, the patient was supposed to take her daughter lunch on day of admission. When he was late, the patient's son was called later found the patient confused and minimally responsive in his car. EMS was activated. The patient was found to have significant elevation of his blood pressure in the emergency department not responsive to hydralazine or labetalol. The patient was admitted to ICU and placed on a Cardene drip. His Cardene drip was ultimately weaned off and the patient remained clinically stable to be transferred to the medical floor. Neurology was consulted and a full stroke workup was undertaken.  Assessment/Plan: Acute encephalopathy -Secondary to hypertensive encephalopathy.  On 11/17 the patient is pleasant and almost at baseline per wife. -ammonia--19 -check B12 (in process) -TSH--8.520  -MRI Brain--neg for stroke, bleed, mass -MRA brain--neg for major arterial stenosis -UDS neg -Evaluated by Neurology who believes his symptoms were due to HTN.  Dysphagia -speech eval-->dysphagia 1 with pudding thickened  -Will ask speech to consider re-eval as patient improves.  Carotid Stenosis Vascular consulted.  Appreciate Dr. Luther Parody recommendations Carotid duplex results below.  Rt. ICA stenosed at 60 - 79%  Leukocytosis -UA neg for pyuria -CXR without infiltrate -will not start abx as pt is afebrile and hemodynamically stable  Malignant HTN -restarted Normodyne at lower dose and monitor -avoid clonidine due to rebound HTN -Patient known to have labile BP and can easily become hypotensive.  Hyperlipidemia -LDL 117 -restarted statin on 11/16  Hypokalemia -mag  2.4 -repleted  Hypothyroidism -TSH 8.520 -Unclear if the patient was taking Synthroid properly -Restart home dose -Repeat TSH in 4-6 weeks  Recent Falls Per wife patient falls approximately 5 times a day.  Unfortunately he is home alone for periods of time while she works. PT recommends SNF.  Family opt for [facility] if possible.  If not they will take him home.   Family Communication:   Pt at beside Disposition Plan:   Inpatient. DVT Prophylaxis:  Lovenox  Consultations:  Vascular Surgery Neurology  Procedures/Studies:  2D Echo Study Conclusions  - Left ventricle: The cavity size was normal. Wall thickness was normal. Systolic function was vigorous. The estimated ejection fraction was in the range of 65% to 70%. Wall motion was normal; there were no regional wall motion abnormalities.  Carotid Duplex Left Carotid Duplex (Doppler) has been completed.  Study was technically difficult due to vessel tortuosity, patient anatomy, and poor patient cooperation. Findings suggest 1-39% left internal carotid artery stenosis. The left vertebral artery is patent with antegrade flow. Bilateral carotid artery duplex attempted. Bilateral: Vertebral artery flow is antegrade. Right: 60-79% ICA stenosis. Left: Patient became agitated before I could evaluate the ICA.   Ct Head Wo Contrast  01/31/2014   CLINICAL DATA:  Code stroke, history of old hemorrhagic stroke, altered mental status currently, initial evaluation  EXAM: CT HEAD WITHOUT CONTRAST  TECHNIQUE: Contiguous axial images were obtained from the base of the skull through the vertex without intravenous contrast.  COMPARISON:  11/12/2012, 11/11/2012  FINDINGS: Severe diffuse atrophy. Moderate low attenuation in the deep white matter. No hydrocephalus. No hemorrhage or extra-axial fluid. No acute vascular territory infarct. It is retention cyst left maxillary sinus not significantly different from prior study. Calvarium  intact.  IMPRESSION: No acute intracranial abnormalities. Critical Value/emergent results were called by telephone at the time of interpretation on 01/31/2014 at 6:23 pm to Dr. Doy Mince, who verbally acknowledged these results.   Electronically Signed   By: Skipper Cliche M.D.   On: 01/31/2014 18:24   Mr Brain Wo Contrast  02/01/2014   CLINICAL DATA:  Altered mental status and confusion, acute in onset yesterday.  EXAM: MRI HEAD WITHOUT CONTRAST  MRA HEAD WITHOUT CONTRAST  TECHNIQUE: Multiplanar, multiecho pulse sequences of the brain and surrounding structures were obtained without intravenous contrast. Angiographic images of the head were obtained using MRA technique without contrast.  COMPARISON:  Head CT 01/31/2014, MRI 11/12/2012, and MRA 10/04/2010  FINDINGS: MRI HEAD FINDINGS  Images are moderately degraded by motion artifact. Abnormal diffusion weighted signal and T2 hyperintensity in the corpus callosum, middle cerebellar peduncles, and scattered throughout the bilateral frontal and parietal white matter does not appear significantly changed from the prior study. No definite acute infarct is identified. Patchy T2 hyperintensities elsewhere in the cerebral white matter bilaterally do not appear significantly changed from the prior MRI and are nonspecific but compatible with moderate chronic small vessel ischemic disease. Advanced cerebral and cerebellar atrophy is unchanged. No intracranial hemorrhage, mass, midline shift, or extra-axial fluid collection is seen.  Orbits are unremarkable. There is left greater the right maxillary sinus polypoid mucosal thickening. Mastoid air cells are clear. Major intracranial vascular flow voids are grossly preserved.  MRA HEAD FINDINGS  Images are moderately to severely degraded by motion artifact, limiting branch vessel evaluation. Visualized distal vertebral arteries are patent with the left being slightly larger than the right. SCA origins appear patent. Basilar  artery is patent without evidence of significant stenosis. PCAs are patent without evidence of high-grade stenosis. There may be mild narrowing of the left PCA near the P1-P2 junction.  Internal carotid arteries are patent from skullbase to carotid termini without gross stenosis. M1 segments are patent without gross stenosis. MCA branch vessel evaluation is limited by motion. Right A1 segment appears hypoplastic. Left A1 segment is grossly unremarkable. Left A2 segment is dominant and patent.  IMPRESSION: 1. No acute intracranial abnormality identified. 2. Unchanged, advanced cerebral atrophy and chronic small vessel ischemic disease. Chronic signal changes in the corpus callosum and middle cerebellar peduncles are also unchanged. 3. Motion degraded MRA. No evidence of major intracranial arterial occlusion or high-grade proximal stenosis.   Electronically Signed   By: Logan Bores   On: 02/01/2014 12:17   Dg Chest Port 1 View  02/02/2014   CLINICAL DATA:  Hypertension  EXAM: PORTABLE CHEST - 1 VIEW  COMPARISON:  12/22/2013, 01/31/2014, 11/12/2012, 04/12/2012  FINDINGS: There is bilateral chronic interstitial thickening. There slightly more increased interstitial markings in the left perihilar region which is similar to 04/09/2012. There is no focal consolidation, pleural effusion, or pneumothorax. The heart and mediastinal contours are unremarkable.  The osseous structures are unremarkable.  IMPRESSION: Bilateral chronic interstitial thickening.  No new active cardiopulmonary disease.   Electronically Signed   By: Kathreen Devoid   On: 02/02/2014 08:17   Dg Chest Port 1 View  01/31/2014   CLINICAL DATA:  Code stroke. History of hypertension, coronary artery disease, subarachnoid hemorrhage  EXAM: PORTABLE CHEST - 1 VIEW  COMPARISON:  11/14/2012  FINDINGS: There are bilateral chronic thickened interstitial markings. There are sudden increased interstitial markings at the left lung base. There is no other focal  parenchymal opacity, pleural effusion, or pneumothorax. The heart and mediastinal  contours are unremarkable.  The osseous structures are unremarkable.  IMPRESSION: Bilateral chronic interstitial thickening. Slightly increased interstitial disease of the left lung base concerning for superimposed acute infectious or inflammatory etiology.   Electronically Signed   By: Kathreen Devoid   On: 01/31/2014 21:19   Dg Swallowing Func-speech Pathology  02/02/2014   [name], CCC-SLP     02/02/2014  1:44 PM Objective Swallowing Evaluation:    Patient Details  Name: BABY STAIRS MRN: [id number] Date of Birth: [birth year]  Today's Date: 02/02/2014 Time:  -     Past Medical History:  Past Medical History  Diagnosis Date  . Hx of transient ischemic attack (TIA)   . Hyperlipidemia   . ED (erectile dysfunction)   . Personal history of kidney stones   . Subarachnoid hemorrhage     '11- hospitalized -3 to 4 days(passed out-awoke in hospital)-  no residual affects.  . Myocardial infarction     12/1991  . Dysphagia   . Hypokalemia   . Hypothyroidism   . Sleep apnea     on CPAP-settings 6  . History of kidney stones 02-27-13    x5- none recent  . Throat cancer     '00-radiation therapy  . Stroke   . Unspecified cerebral artery occlusion with cerebral infarction  11/08/2012  . Episodic confusion 02-27-13    hx. of, not currently(multiple episodes- ? TIA's)-always saw MD  to be evaluated.  Marland Kitchen AKI (acute kidney injury) 02/23/2011    pt. denies any problems 02-27-13  . Hypertension     hx. labile - with spikes every 3-6 mos.-last 8'14 ED visit.  Marland Kitchen Hypertensive encephalopathy   . Headache(784.0)     episodic with periods of confusion -multiple  . Neuromuscular disorder     "parkinson's" -no tremor-sees neurology MD at [facility]  . Coronary artery disease     Dr. [place]   Past Surgical History:  Past Surgical History  Procedure Laterality Date  . Angioplasty  1993    LAD  . Cardiac catheterization    . Coronary angioplasty    . Basal  cell cancer      head  . Hernia repair    . Appendectomy    . Patent foramen ovale closure  2008  . Eye surgery    . Colonoscopy N/A 06/18/2012    Procedure: COLONOSCOPY;  Surgeon: Jeryl Columbia, MD;  Location:  [facility] ENDOSCOPY;  Service: Endoscopy;  Laterality: N/A;  h&p in  file-hope   . Hot hemostasis N/A 06/18/2012    Procedure: HOT HEMOSTASIS (ARGON PLASMA COAGULATION/BICAP);   Surgeon: Jeryl Columbia, MD;  Location: [facility] ENDOSCOPY;  Service:  Endoscopy;  Laterality: N/A;  . Colonoscopy with propofol N/A 03/07/2013    Procedure: COLONOSCOPY WITH PROPOFOL;  Surgeon: Jeryl Columbia,  MD;  Location: [facility] ENDOSCOPY;  Service: Endoscopy;  Laterality:  N/A;   HPI:  73 y.o. male with PMH:  hypertension, subarachnoid hemorrhage  2011, MI, throat cancer 2000 radiation tx, Parkinson's, TIA  admitted after being found unresponsive in his car. MBS 8/14 with  recommendations Dys 1 and pudding thick liquids.  Follow up  treatment 4 days later recommended diet upgrade to regular and  nectar thick liquids (pt. reported to SLP he "usually did not use  his thickener at home").  CXR no new active cardiopulmonary  disease.     Assessment / Plan / Recommendation Clinical Impression  Clinical impression: Pt. exhibited mild oral and mild-moderate  pharyngeal phase  dysphagia characterized by reduced tongue base  retraction and decreased laryngeal elevation resulting in silent  aspiration with honey thick liquids.  Pharyngeal residue overall  mod-max with incomplete clearing given verbal cues for second  swallows (difficulty initiating dry swallow).  Cervical spine  appears somewhat kyphotic and may be preventing a coordinated  swallow.  Recommend Dys 1 pudding thick liquids.  Pt. has history  of dysphagia and was able to decrease liquid viscosity during  previous admission 8/14.  ST will continue to follow and treat.        Treatment Recommendation  Therapy as outlined in treatment plan below    Diet Recommendation Dysphagia 1 (Puree);Pudding-thick  liquid   Liquid Administration via: Spoon Medication Administration: Crushed with puree Supervision: Patient able to self feed;Full supervision/cueing  for compensatory strategies Compensations: Slow rate;Small sips/bites;Multiple dry swallows  after each bite/sip Postural Changes and/or Swallow Maneuvers: Seated upright 90  degrees    Other  Recommendations Oral Care Recommendations: Oral care BID   Follow Up Recommendations   (TBD)    Frequency and Duration min 2x/week  2 weeks   Pertinent Vitals/Pain No pain            Reason for Referral Objectively evaluate swallowing function   Oral Phase     Pharyngeal Phase Pharyngeal - Honey Pharyngeal - Honey Teaspoon: Pharyngeal residue -  valleculae;Pharyngeal residue - pyriform sinuses;Reduced tongue  base retraction;Reduced laryngeal  elevation;Penetration/Aspiration during swallow;Reduced  airway/laryngeal closure Penetration/Aspiration details (honey teaspoon): Material enters  airway, passes BELOW cords without attempt by patient to eject  out (silent aspiration) Pharyngeal - Solids Pharyngeal - Puree: Pharyngeal residue - pyriform  sinuses;Pharyngeal residue - valleculae;Reduced tongue base  retraction;Reduced laryngeal elevation  Cervical Esophageal Phase    GO              [name] 02/02/2014, 1:44 PM  [name] Colvin Caroli.Ed CCC-SLP Pager [phone]     Mr Jodene Nam Head/brain Wo Cm  02/01/2014   CLINICAL DATA:  Altered mental status and confusion, acute in onset yesterday.  EXAM: MRI HEAD WITHOUT CONTRAST  MRA HEAD WITHOUT CONTRAST  TECHNIQUE: Multiplanar, multiecho pulse sequences of the brain and surrounding structures were obtained without intravenous contrast. Angiographic images of the head were obtained using MRA technique without contrast.  COMPARISON:  Head CT 01/31/2014, MRI 11/12/2012, and MRA 10/04/2010  FINDINGS: MRI HEAD FINDINGS  Images are moderately degraded by motion artifact. Abnormal diffusion weighted signal and T2 hyperintensity in  the corpus callosum, middle cerebellar peduncles, and scattered throughout the bilateral frontal and parietal white matter does not appear significantly changed from the prior study. No definite acute infarct is identified. Patchy T2 hyperintensities elsewhere in the cerebral white matter bilaterally do not appear significantly changed from the prior MRI and are nonspecific but compatible with moderate chronic small vessel ischemic disease. Advanced cerebral and cerebellar atrophy is unchanged. No intracranial hemorrhage, mass, midline shift, or extra-axial fluid collection is seen.  Orbits are unremarkable. There is left greater the right maxillary sinus polypoid mucosal thickening. Mastoid air cells are clear. Major intracranial vascular flow voids are grossly preserved.  MRA HEAD FINDINGS  Images are moderately to severely degraded by motion artifact, limiting branch vessel evaluation. Visualized distal vertebral arteries are patent with the left being slightly larger than the right. SCA origins appear patent. Basilar artery is patent without evidence of significant stenosis. PCAs are patent without evidence of high-grade stenosis. There may be mild narrowing of the left PCA near  the P1-P2 junction.  Internal carotid arteries are patent from skullbase to carotid termini without gross stenosis. M1 segments are patent without gross stenosis. MCA branch vessel evaluation is limited by motion. Right A1 segment appears hypoplastic. Left A1 segment is grossly unremarkable. Left A2 segment is dominant and patent.  IMPRESSION: 1. No acute intracranial abnormality identified. 2. Unchanged, advanced cerebral atrophy and chronic small vessel ischemic disease. Chronic signal changes in the corpus callosum and middle cerebellar peduncles are also unchanged. 3. Motion degraded MRA. No evidence of major intracranial arterial occlusion or high-grade proximal stenosis.   Electronically Signed   By: Logan Bores   On: 02/01/2014  12:17        Subjective: Patient is not combative today. He is lucid. He denies any headache, visual disturbance, chest pain, shortness of breath, nausea, vomiting, diarrhea, abdominal pain. No dysuria.  Objective: Filed Vitals:   02/03/14 0055 02/03/14 0437 02/03/14 0942 02/03/14 1258  BP: 96/76 106/74 144/99 114/78  Pulse: 70 74  72  Temp: 98.1 F (36.7 C) 97.8 F (36.6 C)  97.8 F (36.6 C)  TempSrc: Oral Oral  Oral  Resp: 20 20  18   Height:      Weight:      SpO2: 99% 100%  98%    Intake/Output Summary (Last 24 hours) at 02/03/14 1325 Last data filed at 02/03/14 1038  Gross per 24 hour  Intake 1346.67 ml  Output    150 ml  Net 1196.67 ml   Weight change:  Exam:   General:  Pt is alert, follows commands appropriately, not in acute distress  HEENT: No icterus, No thrush, No meningismus, [place]/AT  Cardiovascular: RRR, S1/S2, no rubs, no gallops  Respiratory: CTA bilaterally, no wheezing, no crackles, no rhonchi  Abdomen: Soft/+BS, non tender, non distended, no guarding  Extremities: No edema, No lymphangitis, No petechiae, No rashes, no synovitis  Data Reviewed: Basic Metabolic Panel:  Recent Labs Lab 01/31/14 1749 01/31/14 1808 02/01/14 0927 02/02/14 0600 02/03/14 0642  NA 142 143 137 141 146  K 4.4 4.2 3.3* 3.4* 3.5*  CL 101 103 97 101 107  CO2 28  --  25 26 28   GLUCOSE 129* 132* 161* 117* 101*  BUN 25* 26* 23 20 38*  CREATININE 1.12 1.20 1.03 0.83 1.21  CALCIUM 9.6  --  9.0 9.0 8.8   Liver Function Tests:  Recent Labs Lab 01/31/14 1749 02/02/14 0600 02/03/14 0642  AST 25 24 16   ALT 14 14 12   ALKPHOS 72 68 59  BILITOT 0.4 0.7 0.7  PROT 7.4 7.0 6.3  ALBUMIN 3.9 3.6 3.2*   No results for input(s): LIPASE, AMYLASE in the last 168 hours.  Recent Labs Lab 01/31/14 2220  AMMONIA 19   CBC:  Recent Labs Lab 01/31/14 1749 01/31/14 1808 02/02/14 0600 02/03/14 0642  WBC 9.9  --  15.0* 9.5  NEUTROABS 8.3*  --  13.5*  --   HGB 15.5  16.3 16.3 14.9  HCT 46.0 48.0 47.6 45.2  MCV 100.2*  --  98.1 101.1*  PLT 171  --  166 159   Cardiac Enzymes:  Recent Labs Lab 01/31/14 2220 02/01/14 0413 02/01/14 0927  TROPONINI <0.30 <0.30 <0.30   BNP: Invalid input(s): POCBNP CBG:  Recent Labs Lab 01/31/14 1751  GLUCAP 129*    Recent Results (from the past 240 hour(s))  MRSA PCR Screening     Status: None   Collection Time: 01/31/14  9:51 PM  Result Value Ref  Range Status   MRSA by PCR NEGATIVE NEGATIVE Final    Comment:        The GeneXpert MRSA Assay (FDA approved for NASAL specimens only), is one component of a comprehensive MRSA colonization surveillance program. It is not intended to diagnose MRSA infection nor to guide or monitor treatment for MRSA infections.      Scheduled Meds: . antiseptic oral rinse  7 mL Mouth Rinse q12n4p  . aspirin  300 mg Rectal Daily   Or  . aspirin  325 mg Oral Daily  . atorvastatin  10 mg Oral q1800  . chlorhexidine  15 mL Mouth Rinse BID  . enoxaparin (LOVENOX) injection  40 mg Subcutaneous Q24H  . labetalol  100 mg Oral BID  . levothyroxine  100 mcg Oral QAC breakfast   Continuous Infusions: . sodium chloride 50 mL/hr at 02/02/14 2341  . niCARDipine Stopped (02/01/14 0100)     Karen Kitchens [facility] Pager [phone]  If 7PM-7AM, please contact night-coverage [email or web address] Password [email or web address] 02/03/2014, 1:25 PM   LOS: 3 days

## 2014-02-03 NOTE — Progress Notes (Signed)
*  PRELIMINARY RESULTS* Vascular Ultrasound Left Carotid Duplex (Doppler) has been completed.  Study was technically difficult due to vessel tortuosity, patient anatomy, and poor patient cooperation. Findings suggest 1-39% left internal carotid artery stenosis. The left vertebral artery is patent with antegrade flow.  02/03/2014 9:14 AM Maudry Mayhew, RVT, RDCS, RDMS

## 2014-02-06 ENCOUNTER — Encounter: Payer: Self-pay | Admitting: Cardiology

## 2014-02-06 ENCOUNTER — Ambulatory Visit (INDEPENDENT_AMBULATORY_CARE_PROVIDER_SITE_OTHER): Payer: Medicare HMO | Admitting: Cardiology

## 2014-02-06 VITALS — BP 141/92 | HR 58 | Ht 72.0 in | Wt 162.5 lb

## 2014-02-06 DIAGNOSIS — G9340 Encephalopathy, unspecified: Secondary | ICD-10-CM

## 2014-02-06 DIAGNOSIS — I10 Essential (primary) hypertension: Secondary | ICD-10-CM

## 2014-02-06 DIAGNOSIS — I2510 Atherosclerotic heart disease of native coronary artery without angina pectoris: Secondary | ICD-10-CM

## 2014-02-06 DIAGNOSIS — I674 Hypertensive encephalopathy: Secondary | ICD-10-CM

## 2014-02-06 NOTE — Progress Notes (Signed)
Vanetta Shawl Date of Birth: [birth year]   History of Present Illness: Mr. Mittman is seen today for followup. He he has a history of labile hypertension and hypertensive encephalopathy. He has intermittent severely elevated blood pressure associated with confusion.  He also reports that at times his blood pressure will get too low and he gets very fatigued at those times. He was recently admitted 2 weeks ago with hypertensive encephalopathy. He was found in his car in the driveway. He was confused and did not recognize family members. Required IV Cardene for BP control. Mental status gradually improved after about 36 hours. Still mildly confused but oriented. Seen with daughter today. Remains on Labetolol and prn clonidine.    Current Outpatient Prescriptions on File Prior to Visit  Medication Sig Dispense Refill  . aspirin 325 MG tablet Take 325 mg by mouth daily.      . fish oil-omega-3 fatty acids 1000 MG capsule Take 1 g by mouth daily.     Marland Kitchen GLUCOSAMINE HCL PO Take 1 tablet by mouth daily.    Marland Kitchen labetalol (NORMODYNE) 100 MG tablet Take 1 tablet (100 mg total) by mouth 2 (two) times daily. 60 tablet 3  . lovastatin (MEVACOR) 40 MG tablet Take 80 mg by mouth at bedtime.     . Maltodextrin-Xanthan Gum (RESOURCE THICKENUP CLEAR) POWD Take 1 g by mouth as needed. 1 Can 6  . Multiple Vitamin (MULITIVITAMIN WITH MINERALS) TABS Take 1 tablet by mouth daily.      Marland Kitchen testosterone cypionate (DEPOTESTOTERONE CYPIONATE) 200 MG/ML injection Inject 1 mL (200 mg total) into the muscle every 28 (twenty-eight) days. 10 mL 5   No current facility-administered medications on file prior to visit.    No Known Allergies  Past Medical History  Diagnosis Date  . Hx of transient ischemic attack (TIA)   . Hyperlipidemia   . ED (erectile dysfunction)   . Personal history of kidney stones   . Subarachnoid hemorrhage     '11- hospitalized -3 to 4 days(passed out-awoke in hospital)- no residual affects.  .  Myocardial infarction     12/1991  . Dysphagia   . Hypokalemia   . Hypothyroidism   . Sleep apnea     on CPAP-settings 6  . History of kidney stones 02-27-13    x5- none recent  . Throat cancer     '00-radiation therapy  . Stroke   . Unspecified cerebral artery occlusion with cerebral infarction 11/08/2012  . Episodic confusion 02-27-13    hx. of, not currently(multiple episodes- ? TIA's)-always saw MD to be evaluated.  Marland Kitchen AKI (acute kidney injury) 02/23/2011    pt. denies any problems 02-27-13  . Hypertension     hx. labile - with spikes every 3-6 mos.-last 8'14 ED visit.  Marland Kitchen Hypertensive encephalopathy   . Headache(784.0)     episodic with periods of confusion -multiple  . Neuromuscular disorder     "parkinson's" -no tremor-sees neurology MD at [facility]  . Coronary artery disease     Dr. Martinique    Past Surgical History  Procedure Laterality Date  . Angioplasty  1993    LAD  . Cardiac catheterization    . Coronary angioplasty    . Basal cell cancer      head  . Hernia repair    . Appendectomy    . Patent foramen ovale closure  2008  . Eye surgery    . Colonoscopy N/A 06/18/2012    Procedure: COLONOSCOPY;  Surgeon: Jeryl Columbia, MD;  Location: [facility] ENDOSCOPY;  Service: Endoscopy;  Laterality: N/A;  h&p in file-[facility]   . Hot hemostasis N/A 06/18/2012    Procedure: HOT HEMOSTASIS (ARGON PLASMA COAGULATION/BICAP);  Surgeon: Jeryl Columbia, MD;  Location: [facility] ENDOSCOPY;  Service: Endoscopy;  Laterality: N/A;  . Colonoscopy with propofol N/A 03/07/2013    Procedure: COLONOSCOPY WITH PROPOFOL;  Surgeon: Jeryl Columbia, MD;  Location: [facility] ENDOSCOPY;  Service: Endoscopy;  Laterality: N/A;    History  Smoking status  . Former Smoker  . Types: Cigarettes  . Quit date: 09/21/1990  Smokeless tobacco  . Never Used    History  Alcohol Use No    Family History  Problem Relation Age of Onset  . Heart failure Father   . Heart attack Brother   . Anemia Brother 22    mi    Review of  Systems: As noted in history of present illness.  All other systems were reviewed and are negative.  Physical Exam: BP 141/92 mmHg  Pulse 58  Ht 6' (1.829 m)  Wt 162 lb 8 oz (73.71 kg)  BMI 22.03 kg/m2 He is a pleasant white male in no acute distress. He is normocephalic, atraumatic. Pupils are equal round and reactive. Oropharynx is clear. Sclera clear. Neck is supple without JVD, adenopathy, thyromegaly, or bruits. Lungs are clear. Cardiac exam reveals a regular rate and rhythm without gallop, murmur, or click. Abdomen is soft and nontender without masses or bruits. He has no edema. Pedal pulses are good. He is alert oriented x2. Cranial nerves II through XII are intact. His exam is nonfocal. Memory is poor.  LABORATORY DATA: Records from recent hospitalization reviewed. Carotid dopplers showed 60-80% right ICA stenosis. Echo was unremarkable. MRI without acute change.  Lab Results  Component Value Date   WBC 9.5 02/03/2014   HGB 14.9 02/03/2014   HCT 45.2 02/03/2014   PLT 159 02/03/2014   GLUCOSE 101* 02/03/2014   CHOL 179 02/01/2014   TRIG 90 02/01/2014   HDL 44 02/01/2014   LDLCALC 117* 02/01/2014   ALT 12 02/03/2014   AST 16 02/03/2014   NA 146 02/03/2014   K 3.5* 02/03/2014   CL 107 02/03/2014   CREATININE 1.21 02/03/2014   BUN 38* 02/03/2014   CO2 28 02/03/2014   TSH 8.520* 02/02/2014   INR 1.08 01/31/2014   HGBA1C 5.4 02/01/2014     Assessment / Plan: 1. Labile hypertension with hypertensive encephalopathy. Blood pressure is well controlled now on labetolol only.  He takes clonidine as needed for elevation. I recommend he only take 0.05 mg as needed to try and avoid hypotension. 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 I have again recommended he not drive. I have told his daughter to keep car keys away from him. Unfortunately this has been his pattern over the past 4 years. It is  accelerating and overall I think his prognosis is poor.   2. Coronary disease with remote PTCA in 2003. He is asymptomatic. His last stress test was in 2007.  3. History of subarachnoid hemorrhage.  4. History of TIA/stroke.  5. Chronic aspiration. Speech path recommended pudding thick foods but patient is not following this.

## 2014-02-06 NOTE — Patient Instructions (Signed)
Continue your current therapy   I will see you in 3 months. 

## 2014-02-18 ENCOUNTER — Telehealth: Payer: Self-pay | Admitting: Cardiology

## 2014-02-18 NOTE — Telephone Encounter (Signed)
OK to order OT evaluation.  Peter Martinique MD, [facility]

## 2014-02-18 NOTE — Telephone Encounter (Signed)
Returned call to patient he stated he needed a order for occupational therapy.Stated he has not seen PCP and he will not give a order until after he sees him.Stated he recently saw Dr.Jordan and wanted to know if Dr.Jordan will give order.Message sent to [place].

## 2014-02-18 NOTE — Telephone Encounter (Signed)
Returned call to [facility] with [place] advised ok to order OT evaluation.

## 2014-02-18 NOTE — Telephone Encounter (Signed)
New message      Pt fell thanksgiving day with no injury.  Wife thinks it was due to drop in bp.   Want verbal order for OT evaluation for bathing and dressing.

## 2014-02-26 ENCOUNTER — Telehealth: Payer: Self-pay | Admitting: Cardiology

## 2014-02-26 NOTE — Telephone Encounter (Signed)
Received a call from Herbert Deaner PT with [place].He wanted to let Dr.Jordan know while in patient's home yesterday pt almost blacked out.Stated pt had to be lowered to floor.B/P 79/53.Stated he is home at present hard to hear B/P.Stated he thinks B/P left arm 135/90.Unable to hear in right arm.B/P left arm with automatic cuff 158/102.Stated patient feels ok today.  Spoke to patient he stated he was walking to bathroom yesterday and felt like he was fading away.Stated PT lowered him to the floor.B/P 79/53.Stated after he rested he felt ok.Stated he had not ate yesterday when had weak spell.Stated he feels fine today.Stated he takes his B/P 2 to 3 times a day and B/P has been normal.Appointment was offered but patient does not feel it is necessary.Advised to call back if has any more weak spells or B/P problems.Message sent to [place] for review.

## 2014-03-03 ENCOUNTER — Telehealth: Payer: Self-pay | Admitting: Cardiology

## 2014-03-03 NOTE — Telephone Encounter (Signed)
Clair Gulling called in wanting to make the Dr. Martinique aware that the pt's BP was 150/100 w/ no distress and all other vitals were within normal limits.   Thanks

## 2014-03-03 NOTE — Telephone Encounter (Signed)
Returned call to patient's wife no answer.LMTC. 

## 2014-03-03 NOTE — Telephone Encounter (Signed)
Returned call to patient spoke to wife.She stated husband's B/P is either too low or too high.Stated for the past week B/P elevated ranging 158/110 to 150/100 pulse in the 60's.Stated he feels ok.Stated he is taking medications as prescribed.Will speak to Dr.Jordan and call her back.

## 2014-03-06 ENCOUNTER — Telehealth: Payer: Self-pay | Admitting: Cardiology

## 2014-03-06 NOTE — Telephone Encounter (Signed)
Returned call to patient's wife no answer.LMTC. 

## 2014-03-06 NOTE — Telephone Encounter (Signed)
Received a call from patient's wife Dr.Jordan advised continue same medications no change.Stated she wanted Dr.Jordan to know husband had a episode of confusion last night B/P elevated 200/124.Stated confusion cleared up and B/P dropped 90/60.Stated he feels better today B/P normal.Appointment moved up with Dr.Jordan to 03/27/14 at 4:15 pm.

## 2014-03-06 NOTE — Telephone Encounter (Signed)
Returning your call. °

## 2014-03-06 NOTE — Telephone Encounter (Signed)
See previous 03/06/14 note.

## 2014-03-17 ENCOUNTER — Telehealth: Payer: Self-pay | Admitting: Cardiology

## 2014-03-17 NOTE — Telephone Encounter (Signed)
Herbert Deaner called in to report that the pt is progressing with pt and he is requesting more visits for 2 times a week for 3 wks. Please call  Thanks

## 2014-03-18 NOTE — Telephone Encounter (Signed)
Returned call to Herbert Deaner PT with [place] advised ok for more visits 2 times a week for 3 weeks.

## 2014-03-24 ENCOUNTER — Telehealth: Payer: Self-pay | Admitting: *Deleted

## 2014-03-24 ENCOUNTER — Telehealth: Payer: Self-pay | Admitting: Cardiology

## 2014-03-24 NOTE — Telephone Encounter (Signed)
[facility] called and wanted to report that pt. had slipped on the wet floor in the bathroom stepping out of the shower, that there was no injury and he did not go to the  hospital

## 2014-03-24 NOTE — Telephone Encounter (Signed)
[facility] called [place] to report fall. Patient slipped in tub on Sunday, hit bathtub floor, some bruising on backside. Witnessed by wife. As Physical Therapist states, the bruising was nothing significant, pt did not seek medical attention. He was able to recover w/o assistance.  PT in home today and discussed safety improvements such as bathmat and fixture handles.

## 2014-03-24 NOTE — Telephone Encounter (Signed)
Jim from [facility] called in, call was dropped. Returned call, went to voice mail. LMTCB.

## 2014-03-27 ENCOUNTER — Ambulatory Visit: Payer: Medicare HMO | Admitting: Cardiology

## 2014-04-23 ENCOUNTER — Ambulatory Visit: Payer: Medicare HMO | Admitting: Internal Medicine

## 2014-05-04 ENCOUNTER — Ambulatory Visit: Payer: Medicare HMO | Admitting: Cardiology

## 2014-06-10 ENCOUNTER — Ambulatory Visit: Payer: Medicare HMO | Admitting: Internal Medicine

## 2014-08-03 ENCOUNTER — Other Ambulatory Visit: Payer: Self-pay | Admitting: *Deleted

## 2014-08-03 DIAGNOSIS — I6523 Occlusion and stenosis of bilateral carotid arteries: Secondary | ICD-10-CM

## 2014-08-11 ENCOUNTER — Encounter (HOSPITAL_COMMUNITY): Payer: Medicare HMO

## 2014-08-11 ENCOUNTER — Ambulatory Visit: Payer: Medicare HMO | Admitting: Vascular Surgery

## 2014-08-14 ENCOUNTER — Emergency Department (HOSPITAL_COMMUNITY): Payer: Medicare HMO

## 2014-08-14 ENCOUNTER — Encounter (HOSPITAL_COMMUNITY): Payer: Self-pay | Admitting: Emergency Medicine

## 2014-08-14 ENCOUNTER — Inpatient Hospital Stay (HOSPITAL_COMMUNITY)
Admission: EM | Admit: 2014-08-14 | Discharge: 2014-08-17 | DRG: 078 | Disposition: A | Discharge status: Home / Self Care | Payer: Medicare HMO | Attending: Internal Medicine | Admitting: Internal Medicine

## 2014-08-14 DIAGNOSIS — R197 Diarrhea, unspecified: Secondary | ICD-10-CM | POA: Diagnosis present

## 2014-08-14 DIAGNOSIS — Z8774 Personal history of (corrected) congenital malformations of heart and circulatory system: Secondary | ICD-10-CM | POA: Diagnosis not present

## 2014-08-14 DIAGNOSIS — W57XXXA Bitten or stung by nonvenomous insect and other nonvenomous arthropods, initial encounter: Secondary | ICD-10-CM

## 2014-08-14 DIAGNOSIS — E876 Hypokalemia: Secondary | ICD-10-CM | POA: Diagnosis present

## 2014-08-14 DIAGNOSIS — Z87891 Personal history of nicotine dependence: Secondary | ICD-10-CM | POA: Diagnosis not present

## 2014-08-14 DIAGNOSIS — Z923 Personal history of irradiation: Secondary | ICD-10-CM

## 2014-08-14 DIAGNOSIS — R296 Repeated falls: Secondary | ICD-10-CM | POA: Diagnosis present

## 2014-08-14 DIAGNOSIS — R4182 Altered mental status, unspecified: Secondary | ICD-10-CM | POA: Diagnosis present

## 2014-08-14 DIAGNOSIS — Z6821 Body mass index (BMI) 21.0-21.9, adult: Secondary | ICD-10-CM | POA: Diagnosis not present

## 2014-08-14 DIAGNOSIS — I2510 Atherosclerotic heart disease of native coronary artery without angina pectoris: Secondary | ICD-10-CM | POA: Diagnosis present

## 2014-08-14 DIAGNOSIS — E440 Moderate protein-calorie malnutrition: Secondary | ICD-10-CM | POA: Insufficient documentation

## 2014-08-14 DIAGNOSIS — I674 Hypertensive encephalopathy: Secondary | ICD-10-CM | POA: Diagnosis present

## 2014-08-14 DIAGNOSIS — E039 Hypothyroidism, unspecified: Secondary | ICD-10-CM | POA: Diagnosis present

## 2014-08-14 DIAGNOSIS — Z8673 Personal history of transient ischemic attack (TIA), and cerebral infarction without residual deficits: Secondary | ICD-10-CM

## 2014-08-14 DIAGNOSIS — E785 Hyperlipidemia, unspecified: Secondary | ICD-10-CM | POA: Diagnosis present

## 2014-08-14 DIAGNOSIS — N179 Acute kidney failure, unspecified: Secondary | ICD-10-CM | POA: Diagnosis not present

## 2014-08-14 DIAGNOSIS — R1110 Vomiting, unspecified: Secondary | ICD-10-CM | POA: Diagnosis present

## 2014-08-14 DIAGNOSIS — R112 Nausea with vomiting, unspecified: Secondary | ICD-10-CM | POA: Diagnosis present

## 2014-08-14 DIAGNOSIS — Z955 Presence of coronary angioplasty implant and graft: Secondary | ICD-10-CM | POA: Diagnosis not present

## 2014-08-14 DIAGNOSIS — Z7982 Long term (current) use of aspirin: Secondary | ICD-10-CM | POA: Diagnosis not present

## 2014-08-14 DIAGNOSIS — G9340 Encephalopathy, unspecified: Secondary | ICD-10-CM | POA: Diagnosis present

## 2014-08-14 DIAGNOSIS — R1310 Dysphagia, unspecified: Secondary | ICD-10-CM | POA: Diagnosis present

## 2014-08-14 DIAGNOSIS — Z85819 Personal history of malignant neoplasm of unspecified site of lip, oral cavity, and pharynx: Secondary | ICD-10-CM | POA: Diagnosis not present

## 2014-08-14 DIAGNOSIS — I10 Essential (primary) hypertension: Secondary | ICD-10-CM | POA: Diagnosis present

## 2014-08-14 DIAGNOSIS — R509 Fever, unspecified: Secondary | ICD-10-CM | POA: Diagnosis present

## 2014-08-14 DIAGNOSIS — R410 Disorientation, unspecified: Secondary | ICD-10-CM | POA: Diagnosis not present

## 2014-08-14 LAB — URINE MICROSCOPIC-ADD ON

## 2014-08-14 LAB — MRSA PCR SCREENING: MRSA BY PCR: NEGATIVE

## 2014-08-14 LAB — CSF CELL COUNT WITH DIFFERENTIAL
RBC COUNT CSF: 0 /mm3
RBC Count, CSF: 3 /mm3 — ABNORMAL HIGH
Tube #: 1
Tube #: 4
WBC CSF: 1 /mm3 (ref 0–5)
WBC, CSF: 1 /mm3 (ref 0–5)

## 2014-08-14 LAB — URINALYSIS, ROUTINE W REFLEX MICROSCOPIC
BILIRUBIN URINE: NEGATIVE
GLUCOSE, UA: 100 mg/dL — AB
Ketones, ur: NEGATIVE mg/dL
Leukocytes, UA: NEGATIVE
Nitrite: NEGATIVE
PH: 8.5 — AB (ref 5.0–8.0)
PROTEIN: 100 mg/dL — AB
Specific Gravity, Urine: 1.014 (ref 1.005–1.030)
Urobilinogen, UA: 0.2 mg/dL (ref 0.0–1.0)

## 2014-08-14 LAB — GRAM STAIN

## 2014-08-14 LAB — I-STAT ARTERIAL BLOOD GAS, ED
Acid-Base Excess: 2 mmol/L (ref 0.0–2.0)
BICARBONATE: 26.8 meq/L — AB (ref 20.0–24.0)
O2 Saturation: 97 %
Patient temperature: 100.1
TCO2: 28 mmol/L (ref 0–100)
pCO2 arterial: 41.5 mmHg (ref 35.0–45.0)
pH, Arterial: 7.422 (ref 7.350–7.450)
pO2, Arterial: 92 mmHg (ref 80.0–100.0)

## 2014-08-14 LAB — CBC WITH DIFFERENTIAL/PLATELET
BASOS ABS: 0 10*3/uL (ref 0.0–0.1)
BASOS PCT: 0 % (ref 0–1)
EOS PCT: 0 % (ref 0–5)
Eosinophils Absolute: 0 10*3/uL (ref 0.0–0.7)
HEMATOCRIT: 46.9 % (ref 39.0–52.0)
HEMOGLOBIN: 16.3 g/dL (ref 13.0–17.0)
LYMPHS ABS: 0.4 10*3/uL — AB (ref 0.7–4.0)
LYMPHS PCT: 4 % — AB (ref 12–46)
MCH: 33.5 pg (ref 26.0–34.0)
MCHC: 34.8 g/dL (ref 30.0–36.0)
MCV: 96.5 fL (ref 78.0–100.0)
Monocytes Absolute: 0.4 10*3/uL (ref 0.1–1.0)
Monocytes Relative: 3 % (ref 3–12)
NEUTROS PCT: 93 % — AB (ref 43–77)
Neutro Abs: 10.8 10*3/uL — ABNORMAL HIGH (ref 1.7–7.7)
Platelets: 159 10*3/uL (ref 150–400)
RBC: 4.86 MIL/uL (ref 4.22–5.81)
RDW: 13.3 % (ref 11.5–15.5)
WBC: 11.6 10*3/uL — ABNORMAL HIGH (ref 4.0–10.5)

## 2014-08-14 LAB — TSH: TSH: 1.065 u[IU]/mL (ref 0.350–4.500)

## 2014-08-14 LAB — COMPREHENSIVE METABOLIC PANEL
ALBUMIN: 3.9 g/dL (ref 3.5–5.0)
ALT: 21 U/L (ref 17–63)
AST: 25 U/L (ref 15–41)
Alkaline Phosphatase: 88 U/L (ref 38–126)
Anion gap: 12 (ref 5–15)
BUN: 15 mg/dL (ref 6–20)
CO2: 25 mmol/L (ref 22–32)
Calcium: 9.1 mg/dL (ref 8.9–10.3)
Chloride: 99 mmol/L — ABNORMAL LOW (ref 101–111)
Creatinine, Ser: 1.08 mg/dL (ref 0.61–1.24)
GFR calc Af Amer: >60 mL/min (ref >60)
GLUCOSE: 167 mg/dL — AB (ref 65–99)
Potassium: 3.8 mmol/L (ref 3.5–5.1)
Sodium: 136 mmol/L (ref 135–145)
TOTAL PROTEIN: 7 g/dL (ref 6.5–8.1)
Total Bilirubin: 0.8 mg/dL (ref 0.3–1.2)

## 2014-08-14 LAB — I-STAT CG4 LACTIC ACID, ED
LACTIC ACID, VENOUS: 1.26 mmol/L (ref 0.5–2.0)
Lactic Acid, Venous: 1.33 mmol/L (ref 0.5–2.0)

## 2014-08-14 LAB — PROCALCITONIN: Procalcitonin: <0.1 ng/mL

## 2014-08-14 LAB — LIPASE, BLOOD: Lipase: 38 U/L (ref 22–51)

## 2014-08-14 LAB — CBG MONITORING, ED: Glucose-Capillary: 143 mg/dL — ABNORMAL HIGH (ref 65–99)

## 2014-08-14 LAB — PROTIME-INR
INR: 1.11 (ref 0.00–1.49)
Prothrombin Time: 14.5 seconds (ref 11.6–15.2)

## 2014-08-14 LAB — PROTEIN AND GLUCOSE, CSF
Glucose, CSF: 70 mg/dL (ref 40–70)
Total  Protein, CSF: 59 mg/dL — ABNORMAL HIGH (ref 15–45)

## 2014-08-14 LAB — TROPONIN I

## 2014-08-14 MED ORDER — ACETAMINOPHEN 650 MG RE SUPP
650.0000 mg | Freq: Four times a day (QID) | RECTAL | Status: DC | PRN
  Filled 2014-08-14: qty 1

## 2014-08-14 MED ORDER — ONDANSETRON HCL 4 MG/2ML IJ SOLN
4.0000 mg | Freq: Four times a day (QID) | INTRAMUSCULAR | Status: DC | PRN
  Filled 2014-08-14: qty 2

## 2014-08-14 MED ORDER — DOXYCYCLINE HYCLATE 100 MG IV SOLR
100.0000 mg | Freq: Once | INTRAVENOUS | Status: AC
  Administered 2014-08-14: 100 mg via INTRAVENOUS
  Filled 2014-08-14: qty 100

## 2014-08-14 MED ORDER — LABETALOL HCL 5 MG/ML IV SOLN
20.0000 mg | Freq: Once | INTRAVENOUS | Status: DC
  Filled 2014-08-14: qty 4

## 2014-08-14 MED ORDER — SODIUM CHLORIDE 0.9 % IJ SOLN
3.0000 mL | Freq: Two times a day (BID) | INTRAMUSCULAR | Status: DC
  Administered 2014-08-14 – 2014-08-17 (×4): 3 mL via INTRAVENOUS

## 2014-08-14 MED ORDER — HYDRALAZINE HCL 20 MG/ML IJ SOLN: 10.0000 mg | Freq: Four times a day (QID) | INTRAMUSCULAR | Status: DC | PRN

## 2014-08-14 MED ORDER — SODIUM CHLORIDE 0.9 % IV BOLUS (SEPSIS)
1000.0000 mL | Freq: Once | INTRAVENOUS | Status: AC
  Administered 2014-08-14: 1000 mL via INTRAVENOUS

## 2014-08-14 MED ORDER — ENOXAPARIN SODIUM 40 MG/0.4ML ~~LOC~~ SOLN
40.0000 mg | SUBCUTANEOUS | Status: DC
  Administered 2014-08-15 – 2014-08-16 (×2): 40 mg via SUBCUTANEOUS
  Filled 2014-08-14 (×3): qty 0.4

## 2014-08-14 MED ORDER — DEXTROSE 5 % IV SOLN
100.0000 mg | Freq: Two times a day (BID) | INTRAVENOUS | Status: AC
  Administered 2014-08-15: 100 mg via INTRAVENOUS
  Filled 2014-08-14 (×3): qty 100

## 2014-08-14 MED ORDER — ENOXAPARIN SODIUM 40 MG/0.4ML ~~LOC~~ SOLN
40.0000 mg | SUBCUTANEOUS | Status: AC
  Administered 2014-08-14: 40 mg via SUBCUTANEOUS
  Filled 2014-08-14: qty 0.4

## 2014-08-14 MED ORDER — PIPERACILLIN-TAZOBACTAM 3.375 G IVPB
3.3750 g | Freq: Once | INTRAVENOUS | Status: DC
  Filled 2014-08-14: qty 50

## 2014-08-14 MED ORDER — ONDANSETRON HCL 4 MG PO TABS
4.0000 mg | ORAL_TABLET | Freq: Four times a day (QID) | ORAL | Status: DC | PRN
  Administered 2014-08-15: 4 mg via ORAL

## 2014-08-14 MED ORDER — VANCOMYCIN HCL IN DEXTROSE 1-5 GM/200ML-% IV SOLN
1000.0000 mg | Freq: Once | INTRAVENOUS | Status: AC
  Administered 2014-08-14: 1000 mg via INTRAVENOUS
  Filled 2014-08-14: qty 200

## 2014-08-14 MED ORDER — ACETAMINOPHEN 325 MG PO TABS
650.0000 mg | ORAL_TABLET | Freq: Four times a day (QID) | ORAL | Status: DC | PRN
  Administered 2014-08-14: 650 mg via ORAL
  Filled 2014-08-14: qty 2

## 2014-08-14 MED ORDER — PIPERACILLIN-TAZOBACTAM 3.375 G IVPB 30 MIN
3.3750 g | Freq: Once | INTRAVENOUS | Status: AC
  Administered 2014-08-14: 3.375 g via INTRAVENOUS

## 2014-08-14 MED ORDER — SODIUM CHLORIDE 0.9 % IV SOLN
INTRAVENOUS | Status: DC
  Administered 2014-08-14: 23:00:00 via INTRAVENOUS
  Administered 2014-08-16: 1000 mL via INTRAVENOUS

## 2014-08-14 MED ORDER — METOPROLOL TARTRATE 1 MG/ML IV SOLN
5.0000 mg | Freq: Four times a day (QID) | INTRAVENOUS | Status: DC
  Administered 2014-08-14 – 2014-08-15 (×3): 5 mg via INTRAVENOUS
  Filled 2014-08-14 (×7): qty 5

## 2014-08-14 MED ORDER — SODIUM CHLORIDE 0.9 % IV SOLN
INTRAVENOUS | Status: AC
  Administered 2014-08-14: 16:00:00 via INTRAVENOUS

## 2014-08-14 NOTE — ED Notes (Signed)
Pt noted to have a small alive brown tick to left neck.  Notified Dr Wyvonnia Dusky who is at bedside removing.  Wife unsure of how long tick may have been present.

## 2014-08-14 NOTE — ED Notes (Signed)
Patient transported to CT 

## 2014-08-14 NOTE — ED Notes (Addendum)
Pt from home via [facility] with c/o altered mental status starting at approx 0300 today with HTN, HA, and generalized weakness  On arrival pt lethargic and SpO2 in the 80s on RA.  Pt wife reports she gave him his home BP medication, when the BP was not responsive to treatment she gave 1/2 a pill more.  Unknown medication given.  Wife also reports pt had bowel incontinence which is not normal.  EMS reclined pt at which time he vomited x 1 given 4 mg zofran.  Hx repiratory failure and arrest with aspiration, TIA, UTI.  Pt A&Ox2, confused, intermittently follows commands, denies pain.

## 2014-08-14 NOTE — H&P (Signed)
[facility] History and Physical  Benjamin Park BHA:[id number] DOB: [birth year] DOA: 08/14/2014  Referring physician: [place] PCP: Myriam Jacobson, MD   Chief Complaint: vomiting and confused  HPI: Benjamin Park is a 74 y.o. male  With h/o episodes of recurrent confusion related to hypertensive encephalopathy, previous CVA and SAH, CAD, chronic dysphagia, h/o throat cancer s/p radiation in remission came to ED via EMS with confusion.  Pt unable to provide history, so all history is per wife.  Was in usual state of health yesterday. Had a loose stool today (incontinent), multiple vomiting episodes. Wife thought confusion related to elevated blood pressure, so gave clonidine, which is used prn.  Pt does admit to headache when asked specifically.  In ED, temp 100.1, BP 180/109, tick was found lodged to patient's neck, WBC 11,6000, CXR neg, UA neg, CT brain without anything acute, LFTs and lipase normal.  Per EDP, had coarse breath sounds, and concern for pneumonia, despite negative CXR. Given doxycycline, vancomycin, and zosyn. IV labetalol 20 mg.   After discussion with EDP, LP done and shows, 1 WBC, 3 RBC, normal glucose, and slightly elevated protein at 59. procalcitonin negligible.   Review of Systems:  Unable due to patient factors  Past Medical History  Diagnosis Date  . Hx of transient ischemic attack (TIA)   . Hyperlipidemia   . ED (erectile dysfunction)   . Personal history of kidney stones   . Subarachnoid hemorrhage     '11- hospitalized -3 to 4 days(passed out-awoke in hospital)- no residual affects.  . Myocardial infarction     12/1991  . Dysphagia   . Hypokalemia   . Hypothyroidism   . Sleep apnea     on CPAP-settings 6  . History of kidney stones 02-27-13    x5- none recent  . Throat cancer     '00-radiation therapy  . Stroke   . Unspecified cerebral artery occlusion with cerebral infarction 11/08/2012  . Episodic confusion 02-27-13    hx. of, not  currently(multiple episodes- ? TIA's)-always saw MD to be evaluated.  Marland Kitchen AKI (acute kidney injury) 02/23/2011    pt. denies any problems 02-27-13  . Hypertension     hx. labile - with spikes every 3-6 mos.-last 8'14 ED visit.  Marland Kitchen Hypertensive encephalopathy   . Headache(784.0)     episodic with periods of confusion -multiple  . Neuromuscular disorder     "parkinson's" -no tremor-sees neurology MD at [facility]  . Coronary artery disease     Dr. Martinique   Past Surgical History  Procedure Laterality Date  . Angioplasty  1993    LAD  . Cardiac catheterization    . Coronary angioplasty    . Basal cell cancer      head  . Hernia repair    . Appendectomy    . Patent foramen ovale closure  2008  . Eye surgery    . Colonoscopy N/A 06/18/2012    Procedure: COLONOSCOPY;  Surgeon: Jeryl Columbia, MD;  Location: [facility] ENDOSCOPY;  Service: Endoscopy;  Laterality: N/A;  h&p in file-[facility]   . Hot hemostasis N/A 06/18/2012    Procedure: HOT HEMOSTASIS (ARGON PLASMA COAGULATION/BICAP);  Surgeon: Jeryl Columbia, MD;  Location: [facility] ENDOSCOPY;  Service: Endoscopy;  Laterality: N/A;  . Colonoscopy with propofol N/A 03/07/2013    Procedure: COLONOSCOPY WITH PROPOFOL;  Surgeon: Jeryl Columbia, MD;  Location: [facility] ENDOSCOPY;  Service: Endoscopy;  Laterality: N/A;   Social History:  reports that he quit  smoking about 23 years ago. His smoking use included Cigarettes. He has never used smokeless tobacco. He reports that he does not drink alcohol or use illicit drugs.  No Known Allergies  Family History  Problem Relation Age of Onset  . Heart failure Father   . Heart attack Brother   . Anemia Brother 51    mi    Prior to Admission medications   Medication Sig Start Date End Date Taking? Authorizing Provider  aspirin 325 MG tablet Take 325 mg by mouth daily.     Yes Historical Provider, MD  chlorthalidone (HYGROTON) 25 MG tablet Take 25 mg by mouth daily. 05/28/14  Yes Historical Provider, MD  cloNIDine (CATAPRES) 0.1 MG  tablet Take 0.1 mg by mouth as needed (high blood pressure).  11/29/13  Yes Historical Provider, MD  donepezil (ARICEPT) 10 MG tablet Take 10 mg by mouth at bedtime. 07/23/14  Yes Historical Provider, MD  fish oil-omega-3 fatty acids 1000 MG capsule Take 1 g by mouth daily.    Yes Historical Provider, MD  GLUCOSAMINE HCL PO Take 1 tablet by mouth daily.   Yes Historical Provider, MD  KLOR-CON M10 10 MEQ tablet Take 10 mEq by mouth daily. 05/12/14  Yes Historical Provider, MD  labetalol (NORMODYNE) 100 MG tablet Take 1 tablet (100 mg total) by mouth 2 (two) times daily. 02/03/14  Yes Marianne L York, PA-C  levothyroxine (SYNTHROID, LEVOTHROID) 112 MCG tablet Take 112 mcg by mouth daily.  12/11/13  Yes Historical Provider, MD  lovastatin (MEVACOR) 40 MG tablet Take 80 mg by mouth at bedtime.    Yes Historical Provider, MD  Maltodextrin-Xanthan Gum (RESOURCE THICKENUP CLEAR) POWD Take 1 g by mouth as needed. 02/03/14  Yes Marianne L York, PA-C  Multiple Vitamin (MULITIVITAMIN WITH MINERALS) TABS Take 1 tablet by mouth daily.     Yes Historical Provider, MD  testosterone cypionate (DEPOTESTOTERONE CYPIONATE) 200 MG/ML injection Inject 1 mL (200 mg total) into the muscle every 28 (twenty-eight) days. 11/20/12  Yes Blanchie Serve, MD   Physical Exam: Filed Vitals:   08/14/14 1515 08/14/14 1545 08/14/14 1600 08/14/14 1615  BP: 140/85 150/82 158/95 149/88  Pulse: 82 82 93 78  Temp:      TempSrc:      Resp: 16 18 19    SpO2: 97% 97% 98% 95%    Wt Readings from Last 3 Encounters:  02/06/14 73.71 kg (162 lb 8 oz)  02/01/14 71.8 kg (158 lb 4.6 oz)  12/22/13 77.111 kg (170 lb)  BP 153/92 mmHg  Pulse 96  Temp(Src) 98 F (36.7 C) (Oral)  Resp 22  Ht 6' (1.829 m)  Wt 72.2 kg (159 lb 2.8 oz)  BMI 21.58 kg/m2  SpO2 99%  General Appearance:    Asleep arousable. Falls back asleep. Moans occasionally  Head:    Normocephalic, without obvious abnormality, atraumatic  Eyes:    PERRL, conjunctiva/corneas clear,        Throat:   Lips, mucosa, and tongue normal; teeth and gums normal  Neck:   Supple no TM or LA  Lungs:     Clear to auscultation bilaterally, without WRR  Chest wall:    Heart:    Regular rate and rhythm, S1 and S2 normal, no murmur, rub   or gallop  Abdomen:     Soft, non-tender, bowel sounds, nd  Genitalia:   deferred  Rectal:    deferred  Extremities:   Extremities normal, atraumatic, no cyanosis or edema  Pulses:  2+ and symmetric all extremities  Skin:   Skin color, texture, turgor normal, no rashes or lesions  Lymph nodes:   Cervical, supraclavicular, and axillary nodes normal  Neurologic:   Not cooperative. No obvious defecits          Labs on Admission:  Basic Metabolic Panel:  Recent Labs Lab 08/14/14 1240  NA 136  K 3.8  CL 99*  CO2 25  GLUCOSE 167*  BUN 15  CREATININE 1.08  CALCIUM 9.1   Liver Function Tests:  Recent Labs Lab 08/14/14 1240  AST 25  ALT 21  ALKPHOS 88  BILITOT 0.8  PROT 7.0  ALBUMIN 3.9    Recent Labs Lab 08/14/14 1240  LIPASE 38   No results for input(s): AMMONIA in the last 168 hours. CBC:  Recent Labs Lab 08/14/14 1240  WBC 11.6*  NEUTROABS 10.8*  HGB 16.3  HCT 46.9  MCV 96.5  PLT 159   Cardiac Enzymes:  Recent Labs Lab 08/14/14 1240  TROPONINI <0.03    BNP (last 3 results) No results for input(s): BNP in the last 8760 hours.  ProBNP (last 3 results) No results for input(s): PROBNP in the last 8760 hours.  CBG:  Recent Labs Lab 08/14/14 1224  GLUCAP 143*    Radiological Exams on Admission: Ct Head Wo Contrast  08/14/2014   CLINICAL DATA:  Altered mental status.  EXAM: CT HEAD WITHOUT CONTRAST  TECHNIQUE: Contiguous axial images were obtained from the base of the skull through the vertex without intravenous contrast.  COMPARISON:  CT scan of January 31, 2014.  FINDINGS: Bony calvarium appears intact. Left maxillary mucous retention cyst appears stable. Stable diffuse cortical atrophy is noted.  Mild chronic ischemic white matter disease is noted. No mass effect or midline shift is noted. Ventricular size is within normal limits. There is no evidence of mass lesion, hemorrhage or acute infarction.  IMPRESSION: Stable diffuse cortical atrophy. Mild chronic ischemic white matter disease is noted. No acute intracranial abnormality seen.   Electronically Signed   By: Marijo Conception, M.D.   On: 08/14/2014 13:24   Dg Chest Port 1 View  08/14/2014   CLINICAL DATA:  Altered mental status  EXAM: PORTABLE CHEST - 1 VIEW  COMPARISON:  02/02/2014  FINDINGS: The heart size and mediastinal contours are within normal limits. Aortic atherosclerosis noted. Chronic interstitial coarsening is noted bilaterally. No superimposed airspace consolidation. The visualized skeletal structures are unremarkable.  IMPRESSION: No active disease.   Electronically Signed   By: Kerby Moors M.D.   On: 08/14/2014 13:06    EKG:   Assessment/Plan Principal Problem:   Encephalopathy acute: hypertensive encephalopathy? IV metoprolol and hydralazine.  LP without WBC or RBC. CXR and UA neg.  procalcitonin <0.10. Did have tick bite. Continue doxycycline.  SDU. RMSF titers sent. Will add ehrlichia titers. Multiple similar previous episodes related to accllerated HTN Active Problems:   Fever, low grade. See above. Also, NVD, so could be gastroenterities. Possible tick born illness   Hyperlipidemia   Coronary artery disease   Accelerated hypertension   chronic Dysphagia: ST eval. On thickened liquids at home   Tick bite   Hypothyroidism: check TSH   Frequent falls: PT eval   Vomiting: gastroenteritis? Supportive care   Code Status: full Family Communication: wife at bedside Disposition Plan: admit to SDU.   Time spent: 60 min  [facility] [email or web address] password [facility]

## 2014-08-14 NOTE — ED Notes (Addendum)
CBG 143 Dr. At bedside

## 2014-08-14 NOTE — Progress Notes (Signed)
RT spoke with patient concerning wearing CPAP for the night. Pt wishes to not wear CPAP and states he does not wear one at home. RT instructed patient to call respiratory if he changes his mind.

## 2014-08-14 NOTE — ED Provider Notes (Signed)
CSN: [id number]     Arrival date & time 08/14/14  1154 History   First MD Initiated Contact with Patient 08/14/14 1206     Chief Complaint  Patient presents with  . Altered Mental Status     (Consider location/radiation/quality/duration/timing/severity/associated sxs/prior Treatment) HPI Comments: Level V caveat for altered mental status. Patient brought in by EMS with change in mental status since about 3 AM. Patient was found to be hypoxic and confused and slow to respond. No family available. Wife gave patient blood pressure medication at home. Patient had one episode of vomiting. Patient with history of hypertensive encephalopathy. She is confused and oriented 2. He follows some commands intermittently. She denies any chest pain, abdominal pain or back pain. No reports of fever. He is moving all extremities though his left arm seems to be moving a bit less.  The history is provided by the patient and the EMS personnel. The history is limited by the condition of the patient.    Past Medical History  Diagnosis Date  . Hx of transient ischemic attack (TIA)   . Hyperlipidemia   . ED (erectile dysfunction)   . Personal history of kidney stones   . Subarachnoid hemorrhage     '11- hospitalized -3 to 4 days(passed out-awoke in hospital)- no residual affects.  . Myocardial infarction     12/1991  . Dysphagia   . Hypokalemia   . Hypothyroidism   . Sleep apnea     on CPAP-settings 6  . History of kidney stones 02-27-13    x5- none recent  . Throat cancer     '00-radiation therapy  . Stroke   . Unspecified cerebral artery occlusion with cerebral infarction 11/08/2012  . Episodic confusion 02-27-13    hx. of, not currently(multiple episodes- ? TIA's)-always saw MD to be evaluated.  Marland Kitchen AKI (acute kidney injury) 02/23/2011    pt. denies any problems 02-27-13  . Hypertension     hx. labile - with spikes every 3-6 mos.-last 8'14 ED visit.  Marland Kitchen Hypertensive encephalopathy   .  Headache(784.0)     episodic with periods of confusion -multiple  . Neuromuscular disorder     "parkinson's" -no tremor-sees neurology MD at [facility]  . Coronary artery disease     Dr. Martinique   Past Surgical History  Procedure Laterality Date  . Angioplasty  1993    LAD  . Cardiac catheterization    . Coronary angioplasty    . Basal cell cancer      head  . Hernia repair    . Appendectomy    . Patent foramen ovale closure  2008  . Eye surgery    . Colonoscopy N/A 06/18/2012    Procedure: COLONOSCOPY;  Surgeon: Jeryl Columbia, MD;  Location: [facility] ENDOSCOPY;  Service: Endoscopy;  Laterality: N/A;  h&p in file-[facility]   . Hot hemostasis N/A 06/18/2012    Procedure: HOT HEMOSTASIS (ARGON PLASMA COAGULATION/BICAP);  Surgeon: Jeryl Columbia, MD;  Location: [facility] ENDOSCOPY;  Service: Endoscopy;  Laterality: N/A;  . Colonoscopy with propofol N/A 03/07/2013    Procedure: COLONOSCOPY WITH PROPOFOL;  Surgeon: Jeryl Columbia, MD;  Location: [facility] ENDOSCOPY;  Service: Endoscopy;  Laterality: N/A;   Family History  Problem Relation Age of Onset  . Heart failure Father   . Heart attack Brother   . Anemia Brother 3    mi   History  Substance Use Topics  . Smoking status: Former Smoker    Types: Cigarettes  Quit date: 09/21/1990  . Smokeless tobacco: Never Used  . Alcohol Use: No    Review of Systems  Unable to perform ROS: Mental status change      Allergies  Review of patient's allergies indicates no known allergies.  Home Medications   Prior to Admission medications   Medication Sig Start Date End Date Taking? Authorizing Provider  aspirin 325 MG tablet Take 325 mg by mouth daily.      Historical Provider, MD  cloNIDine (CATAPRES) 0.1 MG tablet Take 0.1 mg by mouth as needed.  11/29/13   Historical Provider, MD  fish oil-omega-3 fatty acids 1000 MG capsule Take 1 g by mouth daily.     Historical Provider, MD  GLUCOSAMINE HCL PO Take 1 tablet by mouth daily.    Historical Provider, MD   labetalol (NORMODYNE) 100 MG tablet Take 1 tablet (100 mg total) by mouth 2 (two) times daily. 02/03/14   Melton Alar, PA-C  labetalol (NORMODYNE) 200 MG tablet  11/02/13   Historical Provider, MD  levothyroxine (SYNTHROID, LEVOTHROID) 112 MCG tablet Take 112 mcg by mouth daily.  12/11/13   Historical Provider, MD  lovastatin (MEVACOR) 40 MG tablet Take 80 mg by mouth at bedtime.     Historical Provider, MD  Maltodextrin-Xanthan Gum (RESOURCE THICKENUP CLEAR) POWD Take 1 g by mouth as needed. 02/03/14   Melton Alar, PA-C  Multiple Vitamin (MULITIVITAMIN WITH MINERALS) TABS Take 1 tablet by mouth daily.      Historical Provider, MD  testosterone cypionate (DEPOTESTOTERONE CYPIONATE) 200 MG/ML injection Inject 1 mL (200 mg total) into the muscle every 28 (twenty-eight) days. 11/20/12   Mahima Pandey, MD   BP 150/87 mmHg  Pulse 78  Temp(Src) 100.1 F (37.8 C) (Rectal)  Resp 17  SpO2 100% Physical Exam  Constitutional: He appears well-developed and well-nourished.  Oriented 2, prefers to lay on left side.  HENT:  Head: Normocephalic and atraumatic.  Mouth/Throat: Oropharynx is clear and moist.  Eyes: Conjunctivae and EOM are normal. Pupils are equal, round, and reactive to light.  Neck: Normal range of motion. Neck supple.  No meningismus  Cardiovascular: Normal rate, regular rhythm and normal heart sounds.   Pulmonary/Chest: Effort normal and breath sounds normal. No respiratory distress.  rhonchorous breath sounds  Abdominal: Soft. There is no tenderness. There is no rebound and no guarding.  Musculoskeletal: Normal range of motion. He exhibits no edema or tenderness.  Neurological:  Oriented 2. Moving all extremities. Not following commands with left arm.    ED Course  LUMBAR PUNCTURE Date/Time: 08/14/2014 4:25 PM Performed by: Ezequiel Essex Authorized by: Ezequiel Essex Consent: Verbal consent obtained. Written consent obtained. Consent given by: patient and  spouse Patient understanding: patient states understanding of the procedure being performed Patient consent: the patient's understanding of the procedure matches consent given Procedure consent: procedure consent matches procedure scheduled Relevant documents: relevant documents present and verified Test results: test results available and properly labeled Site marked: the operative site was marked Imaging studies: imaging studies available Patient identity confirmed: provided demographic data and verbally with patient Time out: Immediately prior to procedure a "time out" was called to verify the correct patient, procedure, equipment, support staff and site/side marked as required. Indications: evaluation for altered mental status and evaluation for infection Anesthesia: local infiltration Local anesthetic: lidocaine 1% without epinephrine Anesthetic total: 5 ml Patient sedated: no Preparation: Patient was prepped and draped in the usual sterile fashion. Lumbar space: L4-L5 interspace Patient's position: left  lateral decubitus Needle gauge: 20 Needle type: spinal needle - Quincke tip Needle length: 2.5 in Number of attempts: 3 Fluid appearance: clear Tubes of fluid: 4 Total volume: 4 ml Post-procedure: site cleaned and adhesive bandage applied Patient tolerance: Patient tolerated the procedure well with no immediate complications  FOREIGN BODY REMOVAL Date/Time: 08/14/2014 4:25 PM Performed by: Ezequiel Essex Authorized by: Ezequiel Essex Consent: Verbal consent obtained. Risks and benefits: risks, benefits and alternatives were discussed Consent given by: patient and spouse Patient understanding: patient states understanding of the procedure being performed Time out: Immediately prior to procedure a "time out" was called to verify the correct patient, procedure, equipment, support staff and site/side marked as required. Body area: skin General location: head/neck Location  details: neck Patient sedated: no Patient restrained: no Patient cooperative: yes Localization method: visualized Removal mechanism: forceps and hemostat Dressing: antibiotic ointment and dressing applied Tendon involvement: none Depth: subcutaneous Complexity: simple 1 objects recovered. Objects recovered: tick Post-procedure assessment: foreign body removed Patient tolerance: Patient tolerated the procedure well with no immediate complications   (including critical care time) Labs Review Labs Reviewed  CBC WITH DIFFERENTIAL/PLATELET - Abnormal; Notable for the following:    WBC 11.6 (*)    Neutrophils Relative % 93 (*)    Neutro Abs 10.8 (*)    Lymphocytes Relative 4 (*)    Lymphs Abs 0.4 (*)    All other components within normal limits  COMPREHENSIVE METABOLIC PANEL - Abnormal; Notable for the following:    Chloride 99 (*)    Glucose, Bld 167 (*)    All other components within normal limits  CBG MONITORING, ED - Abnormal; Notable for the following:    Glucose-Capillary 143 (*)    All other components within normal limits  I-STAT ARTERIAL BLOOD GAS, ED - Abnormal; Notable for the following:    Bicarbonate 26.8 (*)    All other components within normal limits  URINE CULTURE  CULTURE, BLOOD (ROUTINE X 2)  CULTURE, BLOOD (ROUTINE X 2)  LIPASE, BLOOD  TROPONIN I  PROTIME-INR  URINALYSIS, ROUTINE W REFLEX MICROSCOPIC (NOT AT [facility])  I-STAT CG4 LACTIC ACID, ED  I-STAT CG4 LACTIC ACID, ED    Imaging Review Ct Head Wo Contrast  08/14/2014   CLINICAL DATA:  Altered mental status.  EXAM: CT HEAD WITHOUT CONTRAST  TECHNIQUE: Contiguous axial images were obtained from the base of the skull through the vertex without intravenous contrast.  COMPARISON:  CT scan of January 31, 2014.  FINDINGS: Bony calvarium appears intact. Left maxillary mucous retention cyst appears stable. Stable diffuse cortical atrophy is noted. Mild chronic ischemic white matter disease is noted. No mass  effect or midline shift is noted. Ventricular size is within normal limits. There is no evidence of mass lesion, hemorrhage or acute infarction.  IMPRESSION: Stable diffuse cortical atrophy. Mild chronic ischemic white matter disease is noted. No acute intracranial abnormality seen.   Electronically Signed   By: Marijo Conception, M.D.   On: 08/14/2014 13:24   Dg Chest Port 1 View  08/14/2014   CLINICAL DATA:  Altered mental status  EXAM: PORTABLE CHEST - 1 VIEW  COMPARISON:  02/02/2014  FINDINGS: The heart size and mediastinal contours are within normal limits. Aortic atherosclerosis noted. Chronic interstitial coarsening is noted bilaterally. No superimposed airspace consolidation. The visualized skeletal structures are unremarkable.  IMPRESSION: No active disease.   Electronically Signed   By: Kerby Moors M.D.   On: 08/14/2014 13:06     EKG  Interpretation   Date/Time:  Friday Aug 14 2014 11:59:24 EDT Ventricular Rate:  95 PR Interval:  170 QRS Duration: 97 QT Interval:  356 QTC Calculation: 447 R Axis:   66 Text Interpretation:  Sinus rhythm No significant change was found  Confirmed by Wyvonnia Dusky  MD, Henritta Mutz [phone]) on 08/14/2014 1:42:04 PM      MDM   Final diagnoses:  Altered mental status   Altered mental status from home. Last seen normal at 3 AM. Code stroke not activated.  EKG is unchanged. Head CT is negative for acute abnormality.  Wife at bedside. States he was acting confused around 6 am.  Was normal when he went to bed. She gave him half of a clonidine tablet because she was concerned for hypertensive encephalopathy which he has had before and this presentation appears similar. BP fairly well controlled in the ED.  Temp 100.1. Lactate normal.  WBC 11.  CXR negative but lungs coarse with coughing.  Possible aspiration.  Cover with antibiotics especially in setting of hypoxia at home. nonengorged tick removed from neck as well. Add doxycycline.  No meningismus, but no  clear source for AMS and fever.  LP performed after discussion with Dr. Conley Canal.  AMS of unknown etiology. Possible tick borne illness versus viral infection versus PNA.  Antibiotics given after cultures obtained. Protecting airway.  Remains stable in the ED.   CRITICAL CARE Performed by: Ezequiel Essex Total critical care time: 30 Critical care time was exclusive of separately billable procedures and treating other patients. Critical care was necessary to treat or prevent imminent or life-threatening deterioration. Critical care was time spent personally by me on the following activities: development of treatment plan with patient and/or surrogate as well as nursing, discussions with consultants, evaluation of patient's response to treatment, examination of patient, obtaining history from patient or surrogate, ordering and performing treatments and interventions, ordering and review of laboratory studies, ordering and review of radiographic studies, pulse oximetry and re-evaluation of patient's condition.    Ezequiel Essex, MD 08/14/14 2039

## 2014-08-14 NOTE — ED Notes (Signed)
MD at bedside. 

## 2014-08-15 ENCOUNTER — Inpatient Hospital Stay (HOSPITAL_COMMUNITY): Payer: Medicare HMO

## 2014-08-15 DIAGNOSIS — G9340 Encephalopathy, unspecified: Secondary | ICD-10-CM

## 2014-08-15 DIAGNOSIS — E440 Moderate protein-calorie malnutrition: Secondary | ICD-10-CM | POA: Insufficient documentation

## 2014-08-15 LAB — CBC WITH DIFFERENTIAL/PLATELET
Basophils Absolute: 0 10*3/uL (ref 0.0–0.1)
Basophils Relative: 0 % (ref 0–1)
EOS ABS: 0 10*3/uL (ref 0.0–0.7)
EOS PCT: 0 % (ref 0–5)
HCT: 46.6 % (ref 39.0–52.0)
Hemoglobin: 16 g/dL (ref 13.0–17.0)
Lymphocytes Relative: 6 % — ABNORMAL LOW (ref 12–46)
Lymphs Abs: 0.9 10*3/uL (ref 0.7–4.0)
MCH: 32.7 pg (ref 26.0–34.0)
MCHC: 34.3 g/dL (ref 30.0–36.0)
MCV: 95.3 fL (ref 78.0–100.0)
Monocytes Absolute: 1.1 10*3/uL — ABNORMAL HIGH (ref 0.1–1.0)
Monocytes Relative: 9 % (ref 3–12)
NEUTROS PCT: 85 % — AB (ref 43–77)
Neutro Abs: 11.3 10*3/uL — ABNORMAL HIGH (ref 1.7–7.7)
PLATELETS: 171 10*3/uL (ref 150–400)
RBC: 4.89 MIL/uL (ref 4.22–5.81)
RDW: 13.4 % (ref 11.5–15.5)
WBC: 13.3 10*3/uL — ABNORMAL HIGH (ref 4.0–10.5)

## 2014-08-15 LAB — BASIC METABOLIC PANEL
Anion gap: 9 (ref 5–15)
BUN: 11 mg/dL (ref 6–20)
CALCIUM: 8.7 mg/dL — AB (ref 8.9–10.3)
CHLORIDE: 99 mmol/L — AB (ref 101–111)
CO2: 26 mmol/L (ref 22–32)
CREATININE: 0.95 mg/dL (ref 0.61–1.24)
GFR calc non Af Amer: >60 mL/min (ref >60)
Glucose, Bld: 107 mg/dL — ABNORMAL HIGH (ref 65–99)
POTASSIUM: 3.4 mmol/L — AB (ref 3.5–5.1)
Sodium: 134 mmol/L — ABNORMAL LOW (ref 135–145)

## 2014-08-15 MED ORDER — ASPIRIN 325 MG PO TABS
325.0000 mg | ORAL_TABLET | Freq: Every day | ORAL | Status: DC
  Administered 2014-08-15 – 2014-08-17 (×3): 325 mg via ORAL
  Filled 2014-08-15 (×3): qty 1

## 2014-08-15 MED ORDER — LEVOTHYROXINE SODIUM 112 MCG PO TABS
112.0000 ug | ORAL_TABLET | Freq: Every day | ORAL | Status: DC
  Administered 2014-08-15 – 2014-08-17 (×3): 112 ug via ORAL
  Filled 2014-08-15 (×4): qty 1

## 2014-08-15 MED ORDER — ADULT MULTIVITAMIN W/MINERALS CH
1.0000 | ORAL_TABLET | Freq: Every day | ORAL | Status: DC
  Administered 2014-08-15 – 2014-08-17 (×3): 1 via ORAL
  Filled 2014-08-15 (×3): qty 1

## 2014-08-15 MED ORDER — STARCH (THICKENING) PO POWD
Freq: Three times a day (TID) | ORAL | Status: AC
  Administered 2014-08-16 (×2): via ORAL
  Filled 2014-08-15: qty 227

## 2014-08-15 MED ORDER — PRAVASTATIN SODIUM 80 MG PO TABS
80.0000 mg | ORAL_TABLET | Freq: Every day | ORAL | Status: DC
  Administered 2014-08-15 – 2014-08-16 (×2): 80 mg via ORAL
  Filled 2014-08-15 (×3): qty 1

## 2014-08-15 MED ORDER — OMEGA-3 FATTY ACIDS 1000 MG PO CAPS: 1.0000 g | ORAL_CAPSULE | Freq: Every day | ORAL | Status: DC

## 2014-08-15 MED ORDER — LABETALOL HCL 100 MG PO TABS
100.0000 mg | ORAL_TABLET | Freq: Two times a day (BID) | ORAL | Status: DC
  Administered 2014-08-15 – 2014-08-17 (×5): 100 mg via ORAL
  Filled 2014-08-15 (×6): qty 1

## 2014-08-15 MED ORDER — DONEPEZIL HCL 10 MG PO TABS
10.0000 mg | ORAL_TABLET | Freq: Every day | ORAL | Status: DC
  Administered 2014-08-15 – 2014-08-16 (×2): 10 mg via ORAL
  Filled 2014-08-15 (×3): qty 1

## 2014-08-15 MED ORDER — DOXYCYCLINE HYCLATE 100 MG PO TABS
100.0000 mg | ORAL_TABLET | Freq: Two times a day (BID) | ORAL | Status: DC
  Administered 2014-08-16 – 2014-08-17 (×3): 100 mg via ORAL
  Filled 2014-08-15 (×4): qty 1

## 2014-08-15 MED ORDER — OMEGA-3-ACID ETHYL ESTERS 1 G PO CAPS
1.0000 g | ORAL_CAPSULE | Freq: Every day | ORAL | Status: DC
  Administered 2014-08-15 – 2014-08-17 (×3): 1 g via ORAL
  Filled 2014-08-15 (×3): qty 1

## 2014-08-15 MED ORDER — PNEUMOCOCCAL VAC POLYVALENT 25 MCG/0.5ML IJ INJ
0.5000 mL | INJECTION | INTRAMUSCULAR | Status: DC
  Filled 2014-08-15: qty 0.5

## 2014-08-15 MED ORDER — POTASSIUM CHLORIDE CRYS ER 20 MEQ PO TBCR
40.0000 meq | EXTENDED_RELEASE_TABLET | Freq: Once | ORAL | Status: AC
  Administered 2014-08-15: 40 meq via ORAL
  Filled 2014-08-15: qty 2

## 2014-08-15 NOTE — Progress Notes (Signed)
Initial Nutrition Assessment  DOCUMENTATION CODES:  Non-severe (moderate) malnutrition in context of chronic illness  INTERVENTION:  Magic cup (TID), each supplement provides 290 kcal and 9 grams of protein.  Encourage adequate PO intake.   NUTRITION DIAGNOSIS:  Malnutrition related to chronic illness as evidenced by moderate depletion of body fat, severe depletion of muscle mass.  GOAL:  Patient will meet greater than or equal to 90% of their needs  MONITOR:  PO intake, Supplement acceptance, Weight trends, Labs, I & O's  REASON FOR ASSESSMENT:  Malnutrition Screening Tool    ASSESSMENT: With h/o episodes of recurrent confusion related to hypertensive encephalopathy, previous CVA and SAH, CAD, chronic dysphagia, h/o throat cancer s/p radiation in remission came to ED via EMS with confusion.  Pt reports having a lack of appetite since admission. PTA he reports eating well with 3 meals a day with no other difficulties. Pt has just been advanced to a dysphagia 1 diet with pudding thick liquids. Pt reports he usually is on thickened liquids at home however unable to say specific consistency. Weight has been stable. Pt does not consume any nutritional supplements at home. Noted pt with fat and muscle mass depletion. RD to order supplements to aid in repletion.  Nutrition-Focused physical exam completed. Findings are mild to moderate fat depletion, severe muscle depletion, and no edema.   Labs: Low sodium, potassium, calcium, and chloride.  Height:  Ht Readings from Last 1 Encounters:  08/14/14 6' (1.829 m)    Weight:  Wt Readings from Last 1 Encounters:  08/15/14 160 lb 7.9 oz (72.8 kg)    Ideal Body Weight:  81 kg  Wt Readings from Last 10 Encounters:  08/15/14 160 lb 7.9 oz (72.8 kg)  02/06/14 162 lb 8 oz (73.71 kg)  02/01/14 158 lb 4.6 oz (71.8 kg)  12/22/13 170 lb (77.111 kg)  11/05/13 165 lb (74.844 kg)  03/07/13 172 lb (78.019 kg)  01/15/13 176 lb (79.833  kg)  11/15/12 172 lb 9.6 oz (78.291 kg)  11/08/12 185 lb (83.915 kg)  07/15/12 180 lb 12.8 oz (82.01 kg)    BMI:  Body mass index is 21.76 kg/(m^2).  Estimated Nutritional Needs:  Kcal:  1850-2050  Protein:  90-100 grams   Fluid:  1.8 - 2 L/day  Skin:  Reviewed, no issues  Diet Order:  DIET - DYS 1 Room service appropriate?: Yes; Fluid consistency:: Pudding Thick  EDUCATION NEEDS:  No education needs identified at this time   Intake/Output Summary (Last 24 hours) at 08/15/14 1119 Last data filed at 08/15/14 0900  Gross per 24 hour  Intake 1468.42 ml  Output     50 ml  Net 1418.42 ml    Last BM:  PTA  Kallie Locks, MS, RD, LDN Pager # [phone] After hours/ weekend pager # [phone]

## 2014-08-15 NOTE — Evaluation (Signed)
Clinical/Bedside Swallow Evaluation Patient Details  Name: Benjamin Park MRN: [id number] Date of Birth: June 08, 1940  Today's Date: 08/15/2014 Time: SLP Start Time (ACUTE ONLY): 4163 SLP Stop Time (ACUTE ONLY): 0843 SLP Time Calculation (min) (ACUTE ONLY): 17 min  Past Medical History:  Past Medical History  Diagnosis Date  . Hx of transient ischemic attack (TIA)   . Hyperlipidemia   . ED (erectile dysfunction)   . Personal history of kidney stones   . Subarachnoid hemorrhage     '11- hospitalized -3 to 4 days(passed out-awoke in hospital)- no residual affects.  . Myocardial infarction     12/1991  . Dysphagia   . Hypokalemia   . Hypothyroidism   . Sleep apnea     on CPAP-settings 6  . History of kidney stones 02-27-13    x5- none recent  . Throat cancer     '00-radiation therapy  . Stroke   . Unspecified cerebral artery occlusion with cerebral infarction 11/08/2012  . Episodic confusion 02-27-13    hx. of, not currently(multiple episodes- ? TIA's)-always saw MD to be evaluated.  Marland Kitchen AKI (acute kidney injury) 02/23/2011    pt. denies any problems 02-27-13  . Hypertension     hx. labile - with spikes every 3-6 mos.-last 8'14 ED visit.  Marland Kitchen Hypertensive encephalopathy   . Headache(784.0)     episodic with periods of confusion -multiple  . Neuromuscular disorder     "parkinson's" -no tremor-sees neurology MD at [facility]  . Coronary artery disease     Dr. Martinique   Past Surgical History:  Past Surgical History  Procedure Laterality Date  . Angioplasty  1993    LAD  . Cardiac catheterization    . Coronary angioplasty    . Basal cell cancer      head  . Hernia repair    . Appendectomy    . Patent foramen ovale closure  2008  . Eye surgery    . Colonoscopy N/A 06/18/2012    Procedure: COLONOSCOPY;  Surgeon: Jeryl Columbia, MD;  Location: [facility] ENDOSCOPY;  Service: Endoscopy;  Laterality: N/A;  h&p in file-[facility]   . Hot hemostasis N/A 06/18/2012    Procedure: HOT HEMOSTASIS (ARGON  PLASMA COAGULATION/BICAP);  Surgeon: Jeryl Columbia, MD;  Location: [facility] ENDOSCOPY;  Service: Endoscopy;  Laterality: N/A;  . Colonoscopy with propofol N/A 03/07/2013    Procedure: COLONOSCOPY WITH PROPOFOL;  Surgeon: Jeryl Columbia, MD;  Location: [facility] ENDOSCOPY;  Service: Endoscopy;  Laterality: N/A;   HPI:  Pt is a 74 y.o. male with h/o episodes of recurrent confusion related to hypertensive encephalopathy, previous CVA and SAH, CAD, chronic dysphagia, throat cancer s/p radiation in remission who came to the ED via EMS with confusion. CT and chest x ray 08-14-14, negative for acute changes. Most recent MBS November of 2015 recommended dysphagia 1 diet with pudding thick liquids.    Assessment / Plan / Recommendation Clinical Impression  Pt with  chronic dysphagia likely attributed to former throat cancer etiology. Most recent MBS in 11/15 revealed silent aspiration of honey thick liquids and dysphagia 1 pudding thick diet recommendation.  Clinically patient appearing to tolerate dys 1 consistencies, however based upon patients extensive dypshagia history with known silent aspiration recommend MBS for determination of safest least restrictive diet. Will attempt MBS this date.     Aspiration Risk  Moderate    Diet Recommendation          Other  Recommendations Oral Care Recommendations: Oral care  QID   Follow Up Recommendations       Frequency and Duration min 2x/week  2 weeks   Pertinent Vitals/Pain     SLP Swallow Goals     Swallow Study Prior Functional Status       General Date of Onset: 08/14/14 Other Pertinent Information: Pt is a 74 y.o. male with h/o episodes of recurrent confusion related to hypertensive encephalopathy, previous CVA and SAH, CAD, chronic dysphagia, throat cancer s/p radiation in remission who came to the ED via EMS with confusion. CT and chest x ray 08-14-14, negative for acute changes. Most recent MBS November of 2015 recommended dysphagia 1 diet with pudding  thick liquids.  Type of Study: Bedside swallow evaluation Previous Swallow Assessment:  (MBS 11/15; diet pudding thick dys 1) Diet Prior to this Study: NPO Temperature Spikes Noted: Yes Respiratory Status: Room air History of Recent Intubation: No Behavior/Cognition: Confused;Distractible;Alert Oral Cavity - Dentition: Edentulous Self-Feeding Abilities: Able to feed self Patient Positioning: Upright in bed Baseline Vocal Quality: Low vocal intensity Volitional Cough: Strong Volitional Swallow: Able to elicit    Oral/Motor/Sensory Function Overall Oral Motor/Sensory Function: Impaired Labial Symmetry: Within Functional Limits Labial Strength: Reduced Lingual Symmetry: Within Functional Limits Lingual Strength: Reduced Facial Symmetry: Within Functional Limits Facial Strength: Reduced Velum: Within Functional Limits   Ice Chips Ice chips: Impaired Presentation: Self Fed Oral Phase Impairments: Impaired mastication;Reduced lingual movement/coordination Oral Phase Functional Implications: Prolonged oral transit;Oral holding Pharyngeal Phase Impairments: Suspected delayed Swallow   Thin Liquid Thin Liquid: Not tested    Nectar Thick Nectar Thick Liquid: Not tested   Honey Thick Honey Thick Liquid: Within functional limits   Puree Puree: Impaired Presentation: Self Fed Oral Phase Impairments: Reduced lingual movement/coordination Oral Phase Functional Implications: Prolonged oral transit Pharyngeal Phase Impairments: Suspected delayed Swallow   Solid   GO    Arvil Chaco MA, CCC-SLP Acute Care Speech Language Pathologist    Solid: Not tested       Arvil Chaco E 08/15/2014,8:56 AM

## 2014-08-15 NOTE — Progress Notes (Signed)
SLP Cancellation Note  Patient Details Name: Benjamin Park MRN: [id number] DOB: 04-17-1940   Cancelled treatment:       Reason Eval/Treat Not Completed: Medical issues which prohibited therapy; MBSS scheduled this date, pt vomiting. Will re attempt tomorrow.    Arvil Chaco MA, [place] Speech Language Pathologist    Levi Aland 08/15/2014, 1:23 PM

## 2014-08-15 NOTE — Progress Notes (Signed)
Patient refused CPAP. Spoke with him about it and explained what it does and he said that he previously wore it at home years ago, but stopped because he does not care for it.  RT will continue to monitor.

## 2014-08-15 NOTE — Progress Notes (Signed)
[place] TEAM 1 - Stepdown/ICU TEAM Progress Note  Benjamin Park JJK:[id number] DOB: 1940-07-10 DOA: 08/14/2014 PCP: Myriam Jacobson, MD  Admit HPI / Brief Narrative: 74 y.o. male with h/o episodes of recurrent confusion related to hypertensive encephalopathy, previous CVA and SAH, CAD, chronic dysphagia, throat cancer s/p radiation in remission who came to the ED via EMS with confusion. Was in usual state of health the previous day. Had a loose stool with multiple vomiting episodes. Wife thought confusion related to elevated blood pressure, so gave clonidine, which is used prn.   In ED, temp 100.1, BP 180/109, tick was found lodged on patient's neck, WBC 11,6000, CXR neg, UA neg, CT brain without anything acute, LFTs and lipase normal. Per EDP, had coarse breath sounds, and concern for pneumonia, despite negative CXR. Given doxycycline, vancomycin, and zosyn. IV labetalol 20 mg. After discussion with EDP, LP done and shows, 1 WBC, 3 RBC, normal glucose, and slightly elevated protein at 59. Procalcitonin negligible.  HPI/Subjective: The patient is alert and oriented this morning.  He has no complaints denying shortness of breath chest pain headache nausea vomiting or abdominal pain.  He states he has no recollection of being brought to the hospital or the events leading to his admission.  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 Blood pressure is now well controlled - encephalopathy appears to have resolved  Tick bite Empiric doxycycline in setting of acute encephalopathy  Mild hypokalemia Replace and follow - check magnesium  Hyperlipidemia Resume home medical  regimen  Coronary artery disease Asymptomatic  Chronic dysphagia Resume home modified diet and follow  Hypothyroidism Resume home medical regimen  Frequent falls PT/OT evaluation  Code Status: FULL Family Communication: no family present at time of exam Disposition Plan: SDU until mental status stable  Consultants: none  Procedures: none  Antibiotics: Doxycycline 5/27 > Zosyn 5/27  Vanc 5/27   DVT prophylaxis: Lovenox  Objective: Blood pressure 157/84, pulse 73, temperature 98.3 F (36.8 C), temperature source Axillary, resp. rate 12, height 6' (1.829 m), weight 72.8 kg (160 lb 7.9 oz), SpO2 100 %.  Intake/Output Summary (Last 24 hours) at 08/15/14 0735 Last data filed at 08/15/14 0600  Gross per 24 hour  Intake 1243.42 ml  Output     50 ml  Net 1193.42 ml   Exam: General: No acute respiratory distress Lungs: Clear to auscultation bilaterally without wheezes or crackles Cardiovascular: Regular rate and rhythm without murmur gallop or rub normal S1 and S2 Abdomen: Nontender, nondistended, soft, bowel sounds positive, no rebound, no ascites, no appreciable mass Extremities: No significant cyanosis, clubbing, or edema bilateral lower extremities  Data Reviewed: Basic Metabolic Panel:  Recent Labs Lab 08/14/14 1240 08/15/14 0241  NA 136 134*  K 3.8 3.4*  CL 99* 99*  CO2 25 26  GLUCOSE 167* 107*  BUN 15 11  CREATININE 1.08 0.95  CALCIUM 9.1 8.7*    CBC:  Recent Labs Lab 08/14/14 1240 08/15/14 0241  WBC 11.6* 13.3*  NEUTROABS 10.8* 11.3*  HGB 16.3 16.0  HCT 46.9 46.6  MCV 96.5 95.3  PLT 159 171    Liver Function Tests:  Recent Labs Lab 08/14/14 1240  AST 25  ALT 21  ALKPHOS  88  BILITOT 0.8  PROT 7.0  ALBUMIN 3.9    Recent Labs Lab 08/14/14 1240  LIPASE 38   No results for input(s): AMMONIA in the last 168 hours.  Coags:  Recent Labs Lab 08/14/14 1240  INR 1.11   Cardiac Enzymes:  Recent Labs Lab 08/14/14 1240   TROPONINI <0.03    CBG:  Recent Labs Lab 08/14/14 1224  GLUCAP 143*    Recent Results (from the past 240 hour(s))  CSF culture     Status: None (Preliminary result)   Collection Time: 08/14/14  4:15 PM  Result Value Ref Range Status   Specimen Description CSF  Final   Special Requests TUBE 2 TV 1CC  Final   Gram Stain   Final    NO WBC SEEN NO SQUAMOUS EPITHELIAL CELLS SEEN NO ORGANISMS SEEN CYTOSPIN Performed at [facility]    Culture PENDING  Incomplete   Report Status PENDING  Incomplete  Gram stain     Status: None   Collection Time: 08/14/14  4:15 PM  Result Value Ref Range Status   Specimen Description CSF  Final   Special Requests TUBE 2 TV 1CC  Final   Gram Stain   Final    CYTOSPIN PREP WBC PRESENT, PREDOMINANTLY MONONUCLEAR NO ORGANISMS SEEN    Report Status 08/14/2014 FINAL  Final  MRSA PCR Screening     Status: None   Collection Time: 08/14/14  5:12 PM  Result Value Ref Range Status   MRSA by PCR NEGATIVE NEGATIVE Final    Comment:        The GeneXpert MRSA Assay (FDA approved for NASAL specimens only), is one component of a comprehensive MRSA colonization surveillance program. It is not intended to diagnose MRSA infection nor to guide or monitor treatment for MRSA infections.      Studies:   Recent x-ray studies have been reviewed in detail by the Attending Physician  Scheduled Meds:  Scheduled Meds: . doxycycline (VIBRAMYCIN) IV  100 mg Intravenous Q12H  . enoxaparin (LOVENOX) injection  40 mg Subcutaneous Q24H  . labetalol  20 mg Intravenous Once  . metoprolol  5 mg Intravenous 4 times per day  . sodium chloride  3 mL Intravenous Q12H    Time spent on care of this patient: 35 mins   Chelsye Suhre T , MD   [facility] Office  [phone] Pager - Text Page per Shea Evans as per below:  On-Call/Text Page:      [email or web address]      password [email or web address]  If 7PM-7AM, please contact night-coverage [email or web address] Password  [facility] 08/15/2014, 7:35 AM   LOS: 1 day

## 2014-08-15 NOTE — Progress Notes (Signed)
Pt missed 12 noon appt. For M.B.S. R/t vomiting. MD aware. Plan to let pt eat DYS diet with supervision today, continue with Zofran PRN and have pt NPO at midnight for Barium swallow eval. 2/29.

## 2014-08-16 ENCOUNTER — Inpatient Hospital Stay (HOSPITAL_COMMUNITY): Payer: Medicare HMO

## 2014-08-16 LAB — COMPREHENSIVE METABOLIC PANEL
ALT: 18 U/L (ref 17–63)
ANION GAP: 4 — AB (ref 5–15)
AST: 25 U/L (ref 15–41)
Albumin: 3 g/dL — ABNORMAL LOW (ref 3.5–5.0)
Alkaline Phosphatase: 69 U/L (ref 38–126)
BUN: 20 mg/dL (ref 6–20)
CO2: 29 mmol/L (ref 22–32)
CREATININE: 1.27 mg/dL — AB (ref 0.61–1.24)
Calcium: 8.8 mg/dL — ABNORMAL LOW (ref 8.9–10.3)
Chloride: 106 mmol/L (ref 101–111)
GFR calc Af Amer: >60 mL/min (ref >60)
GFR calc non Af Amer: 54 mL/min — ABNORMAL LOW (ref >60)
GLUCOSE: 99 mg/dL (ref 65–99)
Potassium: 3.3 mmol/L — ABNORMAL LOW (ref 3.5–5.1)
Sodium: 139 mmol/L (ref 135–145)
TOTAL PROTEIN: 6 g/dL — AB (ref 6.5–8.1)
Total Bilirubin: 0.8 mg/dL (ref 0.3–1.2)

## 2014-08-16 LAB — URINE CULTURE: Colony Count: 15000

## 2014-08-16 LAB — CBC
HCT: 43.9 % (ref 39.0–52.0)
HEMOGLOBIN: 14.7 g/dL (ref 13.0–17.0)
MCH: 32.5 pg (ref 26.0–34.0)
MCHC: 33.5 g/dL (ref 30.0–36.0)
MCV: 97.1 fL (ref 78.0–100.0)
Platelets: 140 10*3/uL — ABNORMAL LOW (ref 150–400)
RBC: 4.52 MIL/uL (ref 4.22–5.81)
RDW: 13.7 % (ref 11.5–15.5)
WBC: 7.6 10*3/uL (ref 4.0–10.5)

## 2014-08-16 LAB — PROCALCITONIN

## 2014-08-16 LAB — FOLATE: Folate: 26.1 ng/mL (ref >5.9)

## 2014-08-16 LAB — VITAMIN B12: Vitamin B-12: 473 pg/mL (ref 180–914)

## 2014-08-16 LAB — MAGNESIUM: MAGNESIUM: 2 mg/dL (ref 1.7–2.4)

## 2014-08-16 MED ORDER — POTASSIUM CHLORIDE CRYS ER 20 MEQ PO TBCR
40.0000 meq | EXTENDED_RELEASE_TABLET | Freq: Two times a day (BID) | ORAL | Status: AC
  Administered 2014-08-16 – 2014-08-17 (×3): 40 meq via ORAL
  Filled 2014-08-16 (×3): qty 2

## 2014-08-16 MED ORDER — STARCH (THICKENING) PO POWD
Freq: Three times a day (TID) | ORAL | Status: DC
Start: 2014-08-16 — End: 2014-08-17
  Administered 2014-08-16 – 2014-08-17 (×2): via ORAL

## 2014-08-16 MED ORDER — STARCH (THICKENING) PO POWD: Freq: Three times a day (TID) | ORAL | Status: DC

## 2014-08-16 NOTE — Progress Notes (Signed)
Patient refused CPAP. Informed him to let staff know if he changed his mind. RT will continue to monitor.

## 2014-08-16 NOTE — Progress Notes (Signed)
[place] TEAM 1 - Stepdown/ICU TEAM Progress Note  Benjamin Park [id number]:[id number] DOB: March 02, 1941 DOA: 08/14/2014 PCP: Myriam Jacobson, MD  Admit HPI / Brief Narrative: 74 y.o. male with h/o episodes of recurrent confusion related to hypertensive encephalopathy, previous CVA and SAH, CAD, chronic dysphagia, throat cancer s/p radiation in remission who came to the ED via EMS with confusion. Was in usual state of health the previous day. Had a loose stool with multiple vomiting episodes. Wife thought confusion related to elevated blood pressure, so gave clonidine, which is used prn.   In ED, temp 100.1, BP 180/109, tick was found lodged on patient's neck, WBC 11,6000, CXR neg, UA neg, CT brain without anything acute, LFTs and lipase normal. Per EDP, had coarse breath sounds, and concern for pneumonia, despite negative CXR. Given doxycycline, vancomycin, and zosyn. IV labetalol 20 mg. After discussion with EDP, LP done and shows, 1 WBC, 3 RBC, normal glucose, and slightly elevated protein at 59. Procalcitonin negligible.  HPI/Subjective: The patient is resting comfortably.  He denies any complaints whatsoever.  He denies headache shortness breath fevers chills nausea vomiting chest pain or abdominal pain.  He feels that his mental status is at his baseline.  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  Tick bite Empiric doxycycline in setting of acute encephalopathy - to complete 10 days of therapy  Mild hypokalemia Replace to goal of 4.0 - magnesium normal  Hyperlipidemia Resume home medical regimen  Coronary artery disease Asymptomatic  Chronic dysphagia Resume home modified diet and follow  Hypothyroidism Resume home medical regimen  Frequent falls PT/OT evaluations - PT suggests outpatient PT  Code Status: FULL Family Communication: no family present at time of exam Disposition Plan: SDU - probable discharge home in a.m. therefore will not transfer patient so as to avoid exacerbating acute delirium  Consultants: none  Procedures: none  Antibiotics: Doxycycline 5/27 > Zosyn 5/27  Vanc 5/27   DVT prophylaxis: Lovenox  Objective: Blood pressure 120/75, pulse 57, temperature 98 F (36.7 C), temperature source Oral, resp. rate 14, height 6' (1.829 m), weight 72.8 kg (160 lb 7.9 oz), SpO2 97 %.  Intake/Output Summary (Last 24 hours) at 08/16/14 1430 Last data filed at 08/16/14 1016  Gross per 24 hour  Intake    478 ml  Output   1450 ml  Net   -972 ml   Exam: General: No acute respiratory distress - alert oriented and pleasant Lungs: Clear to auscultation bilaterally without wheezes / crackles Cardiovascular: Regular rate and rhythm without murmur gallop or rub  Abdomen: Nontender, nondistended, soft, bowel sounds positive, no rebound, no ascites, no appreciable mass Extremities: No significant cyanosis, clubbing, edema bilateral lower extremities  Data Reviewed: Basic Metabolic Panel:  Recent Labs Lab 08/14/14 1240 08/15/14 0241 08/16/14 0552  NA 136 134* 139  K 3.8 3.4* 3.3*  CL 99* 99* 106  CO2 25 26 29   GLUCOSE 167* 107* 99  BUN 15 11 20   CREATININE 1.08 0.95 1.27*  CALCIUM 9.1 8.7* 8.8*  MG  --   --  2.0    CBC:  Recent Labs Lab 08/14/14 1240 08/15/14 0241 08/16/14 0552  WBC 11.6* 13.3* 7.6    NEUTROABS 10.8* 11.3*  --   HGB 16.3 16.0 14.7  HCT 46.9 46.6 43.9  MCV 96.5 95.3 97.1  PLT 159 171 140*    Liver Function Tests:  Recent Labs Lab 08/14/14 1240 08/16/14 0552  AST 25 25  ALT 21 18  ALKPHOS 88 69  BILITOT 0.8 0.8  PROT 7.0 6.0*  ALBUMIN 3.9 3.0*    Recent Labs Lab 08/14/14 1240  LIPASE 38   Coags:  Recent Labs Lab 08/14/14 1240  INR 1.11   Cardiac Enzymes:  Recent Labs Lab 08/14/14 1240  TROPONINI <0.03    CBG:  Recent Labs Lab 08/14/14 1224  GLUCAP 143*    Recent Results (from the past 240 hour(s))  Urine culture     Status: None   Collection Time: 08/14/14 12:00 PM  Result Value Ref Range Status   Specimen Description URINE, RANDOM  Final   Special Requests NONE  Final   Colony Count   Final    15,000 COLONIES/ML Performed at [facility]    Culture   Final    Multiple bacterial morphotypes present, none predominant. Suggest appropriate recollection if clinically indicated. Performed at [facility]    Report Status 08/16/2014 FINAL  Final  Culture, blood (routine x 2)     Status: None (Preliminary result)   Collection Time: 08/14/14 12:45 PM  Result Value Ref Range Status   Specimen Description BLOOD RIGHT ARM  Final   Special Requests BOTTLES DRAWN AEROBIC AND ANAEROBIC 10CC  Final   Culture   Final           BLOOD CULTURE RECEIVED NO GROWTH TO DATE CULTURE WILL BE HELD FOR 5 DAYS BEFORE ISSUING A FINAL NEGATIVE REPORT Note: Culture results may be compromised due to an excessive volume of blood received in culture bottles. Performed at [facility]    Report Status PENDING  Incomplete  Culture, blood (routine x 2)     Status: None (Preliminary result)   Collection Time: 08/14/14 12:50 PM  Result Value Ref Range Status   Specimen Description BLOOD RIGHT ARM  Final   Special Requests BOTTLES DRAWN AEROBIC AND ANAEROBIC 10CC  Final   Culture   Final           BLOOD CULTURE RECEIVED NO GROWTH TO  DATE CULTURE WILL BE HELD FOR 5 DAYS BEFORE ISSUING A FINAL NEGATIVE REPORT Note: Culture results may be compromised due to an excessive volume of blood received in culture bottles. Performed at [facility]    Report Status PENDING  Incomplete  CSF culture     Status: None (Preliminary result)   Collection Time: 08/14/14  4:15 PM  Result Value Ref Range Status   Specimen Description CSF  Final   Special Requests TUBE 2 TV 1CC  Final   Gram Stain   Final    NO WBC SEEN NO SQUAMOUS EPITHELIAL CELLS SEEN NO ORGANISMS SEEN CYTOSPIN Performed at [facility]    Culture   Final    NO GROWTH 1 DAY Performed at [facility]    Report Status PENDING  Incomplete  Gram stain     Status: None   Collection Time: 08/14/14  4:15 PM  Result Value Ref Range Status   Specimen Description CSF  Final   Special Requests TUBE 2 TV 1CC  Final   Gram Stain   Final    CYTOSPIN PREP WBC PRESENT, PREDOMINANTLY MONONUCLEAR NO ORGANISMS SEEN    Report Status 08/14/2014 FINAL  Final  MRSA PCR Screening     Status: None   Collection Time: 08/14/14  5:12 PM  Result Value Ref Range Status   MRSA by PCR NEGATIVE NEGATIVE Final    Comment:        The GeneXpert MRSA Assay (FDA approved for NASAL specimens only), is one component of a comprehensive MRSA colonization surveillance program. It is not intended to diagnose MRSA infection nor to guide or monitor treatment for MRSA infections.      Studies:   Recent x-ray studies have been reviewed in detail by the Attending Physician  Scheduled Meds:  Scheduled Meds: . aspirin  325 mg Oral Daily  . donepezil  10 mg Oral QHS  . doxycycline  100 mg Oral Q12H  . enoxaparin (LOVENOX) injection  40 mg Subcutaneous Q24H  . food thickener   Oral 3 x daily with food  . labetalol  100 mg Oral BID  . labetalol  20 mg Intravenous Once  . levothyroxine  112 mcg Oral QAC breakfast  . multivitamin with minerals  1 tablet Oral Daily    . omega-3 acid ethyl esters  1 g Oral Daily  . pneumococcal 23 valent vaccine  0.5 mL Intramuscular Tomorrow-1000  . pravastatin  80 mg Oral q1800  . sodium chloride  3 mL Intravenous Q12H    Time spent on care of this patient: 35 mins   Shamell Suarez T , MD   [facility] Office  [phone] Pager - Text Page per Shea Evans as per below:  On-Call/Text Page:      [email or web address]      password [email or web address]  If 7PM-7AM, please contact night-coverage [email or web address] Password [email or web address] 08/16/2014, 2:30 PM   LOS: 2 days

## 2014-08-16 NOTE — Progress Notes (Signed)
MBSS complete. Full report located under chart review in imaging section.  Blair Heys, [place], [place]

## 2014-08-16 NOTE — Evaluation (Signed)
Physical Therapy Evaluation Patient Details Name: Benjamin Park MRN: [id number] DOB: 08-Sep-1940 Today's Date: 08/16/2014   History of Present Illness  74 y.o. male with h/o episodes of recurrent confusion related to hypertensive encephalopathy, previous CVA and SAH, CAD, chronic dysphagia, throat cancer s/p radiation in remission who came to the ED via EMS with confusion. Was in usual state of health the previous day. Had a loose stool with multiple vomiting episodes. Wife thought confusion related to elevated blood pressure, so gave clonidine, which is used prn. In ED, tick was found lodged on patient's neck, WBC count normal, CXR neg, UA neg, CT brain without anything acute, LP unrevealing.  Clinical Impression  Pt admitted with above diagnosis. Pt currently with functional limitations due to the deficits listed below (see PT Problem List). Pt with significant balance deficits with h/o falls at home and continued fall risk.  Pt will benefit from skilled PT to increase their independence and safety with mobility to allow discharge to the venue listed below.       Follow Up Recommendations Outpatient PT (neuro)    Equipment Recommendations  None recommended by PT    Recommendations for Other Services OT consult     Precautions / Restrictions Precautions Precautions: Fall Precaution Comments: history of falls at home Restrictions Weight Bearing Restrictions: No      Mobility  Bed Mobility Overal bed mobility: Modified Independent                Transfers Overall transfer level: Needs assistance Equipment used: None Transfers: Sit to/from Stand Sit to Stand: Supervision         General transfer comment: pt stands and sits quickly but loses his balance as soon as he begins to move.   Ambulation/Gait Ambulation/Gait assistance: Min assist Ambulation Distance (Feet): 200 Feet Assistive device: 1 person hand held assist Gait Pattern/deviations: Step-through  pattern;Ataxic;Staggering right;Narrow base of support Gait velocity: faster than is safe for him currently, trunk getting ahead of LE's at times Gait velocity interpretation: >2.62 ft/sec, indicative of independent community ambulator General Gait Details: pt with multiple LOB to right, min A to correct, gait ataxic at times with narrow BOS, frequent side steps and near crossover steps. Increased bilateral knee flexion with fatigue. Fast pace.  Stairs            Wheelchair Mobility    Modified Rankin (Stroke Patients Only)       Balance Overall balance assessment: Needs assistance Sitting-balance support: No upper extremity supported Sitting balance-Leahy Scale: Good     Standing balance support: No upper extremity supported Standing balance-Leahy Scale: Poor Standing balance comment: balance fair in static standing  but with dyanmic activity, tends to lose balance to right with slow stepping response to right                             Pertinent Vitals/Pain Pain Assessment: No/denies pain    Home Living Family/patient expects to be discharged to:: Private residence Living Arrangements: Spouse/significant other Available Help at Discharge: Family;Available PRN/intermittently Type of Home: House Home Access: Stairs to enter Entrance Stairs-Rails: Right Entrance Stairs-Number of Steps: 5 Home Layout: One level;Laundry or work area in [place]: Environmental consultant - 2 wheels;Cane - single point Additional Comments: pt's wife works as Quarry manager at [employer] 2nd shift as well as Ecologist a few mornings a week. Pt does not drive.    Prior Function Level of  Independence: Independent with assistive device(s)         Comments: walks with cane, frequent falls. Mows grass on his own     Hand Dominance   Dominant Hand: Right    Extremity/Trunk Assessment   Upper Extremity Assessment: Overall WFL for tasks assessed           Lower  Extremity Assessment: Overall WFL for tasks assessed      Cervical / Trunk Assessment: Normal  Communication   Communication: HOH  Cognition Arousal/Alertness: Awake/alert Behavior During Therapy: WFL for tasks assessed/performed Overall Cognitive Status: Impaired/Different from baseline Area of Impairment: Orientation;Memory;Following commands;Safety/judgement Orientation Level: Disoriented to;Place   Memory: Decreased short-term memory Following Commands: Follows one step commands with increased time;Follows multi-step commands with increased time Safety/Judgement: Decreased awareness of deficits;Decreased awareness of safety     General Comments: Pt could not tell me at first where he was though later in eval reported that he has "just been joking" about that. RN reports that he has thought he was at [facility] each time she asked him. He is slow to respond but feel that this is more because of HOH than cognition. Does show decreased awareness of balance deficits and fall risk.     General Comments      Exercises        Assessment/Plan    PT Assessment Patient needs continued PT services  PT Diagnosis Abnormality of gait;Difficulty walking;Altered mental status   PT Problem List Decreased balance;Decreased mobility;Decreased coordination;Decreased cognition;Decreased safety awareness;Decreased knowledge of precautions;Decreased knowledge of use of DME  PT Treatment Interventions DME instruction;Gait training;Stair training;Functional mobility training;Therapeutic activities;Therapeutic exercise;Balance training;Cognitive remediation;Neuromuscular re-education;Patient/family education   PT Goals (Current goals can be found in the Care Plan section) Acute Rehab PT Goals Patient Stated Goal: return home PT Goal Formulation: With patient Time For Goal Achievement: 08/30/14 Potential to Achieve Goals: Good Additional Goals Additional Goal #1: Pt to score >19 on DGI    Frequency Min  3X/week   Barriers to discharge Decreased caregiver support alone much of the day    Co-evaluation               End of Session Equipment Utilized During Treatment: Gait belt Activity Tolerance: Patient tolerated treatment well Patient left: in chair;with chair alarm set;with call bell/phone within reach Nurse Communication: Mobility status         Time: 0850-0920 PT Time Calculation (min) (ACUTE ONLY): 30 min   Charges:   PT Evaluation $Initial PT Evaluation Tier I: 1 Procedure PT Treatments $Gait Training: 8-22 mins   PT G Codes:       Leighton Roach, PT  Acute Rehab Services  [phone]  Leighton Roach 08/16/2014, 9:49 AM

## 2014-08-16 NOTE — Progress Notes (Signed)
   08/16/14 1200  SLP Visit Information  SLP Received On 08/16/14  General Information  Behavior/Cognition Alert;Cooperative;Pleasant mood  Patient Positioning Upright in chair/Tumbleform  Oral care provided N/A  Other Pertinent Information Pt is a 74 y.o. male with h/o episodes of recurrent confusion related to hypertensive encephalopathy, previous CVA and SAH, CAD, chronic dysphagia, throat cancer s/p radiation in remission who came to the ED via EMS with confusion. CT and chest x ray 08-14-14, negative for acute changes. Most recent MBS November of 2015 recommended dysphagia 1 diet with pudding thick liquids.   Temperature Spikes Noted Yes  Respiratory Status Room air  Oral Cavity - Dentition Edentulous  Patient observed directly with PO's Yes  Type of PO's observed Dysphagia 1 (puree);Nectar-thick liquids  Feeding Able to feed self  Liquids provided via Cup  Treatment Provided  Treatment provided Dysphagia; Pt reports he has been drinking nectar thickened liquids at home. Nausea and vomiting have resolved. At bedside pt with slight wet vocal quality following trials of nectar, but no cough. Pt reports he tolerates this well at home, but did not seem to understand concept of silent aspiration. Pt reports he wants to modify diet as needed to prevent aspiration. Given history and potential for infection now, recommend MBS for objective verification of silent aspiration and recommendations for pt.   Dysphagia Treatment  Treatment Methods Skilled observation;Upgraded PO texture trial;Patient/caregiver education  Pharyngeal Phase Signs & Symptoms Wet vocal quality  Type of cueing Verbal  Amount of cueing Minimal  Pain Assessment  Pain Assessment No/denies pain  SLP - End of Session  Patient left with call bell/phone within reach;in chair  Nurse Communication Other (comment) (plan for MBS)  Assessment / Recommendations / Plan  Plan MBS  Dysphagia Recommendations  Diet recommendations  Dysphagia 1 (puree);Pudding-thick liquid  Liquids provided via Teaspoon  Medication Administration Whole meds with puree  Supervision Patient able to self feed  Progression Toward Goals  Progression toward goals Progressing toward goals  SLP Time Calculation  SLP Start Time (ACUTE ONLY) 1205  SLP Stop Time (ACUTE ONLY) 1213  SLP Time Calculation (min) (ACUTE ONLY) 8 min  SLP Evaluations  $ SLP Speech Visit 1 Procedure  SLP Evaluations  $Swallowing Treatment 1 Procedure

## 2014-08-17 LAB — BASIC METABOLIC PANEL
ANION GAP: 5 (ref 5–15)
BUN: 24 mg/dL — AB (ref 6–20)
CHLORIDE: 111 mmol/L (ref 101–111)
CO2: 27 mmol/L (ref 22–32)
Calcium: 8.6 mg/dL — ABNORMAL LOW (ref 8.9–10.3)
Creatinine, Ser: 1.03 mg/dL (ref 0.61–1.24)
GFR calc Af Amer: >60 mL/min (ref >60)
GFR calc non Af Amer: >60 mL/min (ref >60)
Glucose, Bld: 96 mg/dL (ref 65–99)
POTASSIUM: 4 mmol/L (ref 3.5–5.1)
SODIUM: 143 mmol/L (ref 135–145)

## 2014-08-17 MED ORDER — LABETALOL HCL 200 MG PO TABS
200.0000 mg | ORAL_TABLET | Freq: Two times a day (BID) | ORAL | Status: DC
  Filled 2014-08-17: qty 1

## 2014-08-17 MED ORDER — CLONIDINE HCL 0.1 MG PO TABS: 0.0500 mg | ORAL_TABLET | Freq: Two times a day (BID) | ORAL | Status: DC

## 2014-08-17 MED ORDER — DOXYCYCLINE HYCLATE 100 MG PO TABS: 100.0000 mg | ORAL_TABLET | Freq: Two times a day (BID) | ORAL | Status: DC

## 2014-08-17 NOTE — Progress Notes (Signed)
Patient is discharge to home with wife.  Discharge instructions and prescription for Clonidine and Doxycycline was given to wife.

## 2014-08-17 NOTE — Discharge Summary (Addendum)
DISCHARGE SUMMARY  Benjamin Park  MR#: [id number]  DOB:20-Sep-1940  Date of Admission: 08/14/2014 Date of Discharge: 08/17/2014  Attending Physician:Carlene Bickley T  Patient's PNT:[email or web address], Benjamin Given, MD  Consults:  None   Disposition: D/C home   Follow-up Appts:     Follow-up Information    Follow up with Benjamin Park, Benjamin Given, MD. Schedule an appointment as soon as possible for a visit in 1 week.   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 male with h/o episodes of recurrent confusion related to hypertensive encephalopathy, previous CVA and SAH, CAD, chronic dysphagia, throat cancer s/p radiation in remission who came to the ED via EMS with confusion. Was in usual state of health the previous day. Had a loose stool with multiple vomiting episodes. Wife thought confusion related to elevated blood pressure, so gave clonidine, which is used prn.   In ED, temp 100.1, BP 180/109, tick was found lodged on patient's neck, WBC 11,6000, CXR neg, UA neg, CT brain without anything acute, LFTs and lipase normal. Per EDP, had coarse breath sounds, and concern for pneumonia, despite negative CXR. Park doxycycline, vancomycin, and zosyn. IV labetalol 20 mg. After discussion with EDP, LP  done and shows, 1 WBC, 3 RBC, normal glucose, and slightly elevated protein at 59. Procalcitonin negligible.  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 Blood pressure is now well controlled - encephalopathy appears to have resolved coincident with blood pressure control - will add clonidine 0.05mg  BID to home BP regimen  Acute renal failure Creatinine improved w/ hydration and is stable at time of d/c   Tick bite Empiric doxycycline in setting of acute encephalopathy - to complete 10 days of therapy  Mild hypokalemia Replaced to goal of 4.0 - magnesium normal  Hyperlipidemia Resume home medical regimen  Coronary artery disease Asymptomatic  Chronic dysphagia Resume home modified diet and follow  Hypothyroidism Resume home medical regimen  Frequent falls PT/OT evaluation completed - pt cleared for d/c home      Medication List    TAKE these medications        aspirin 325 MG tablet  Take 325 mg by mouth daily.     chlorthalidone 25 MG tablet  Commonly known as:  HYGROTON  Take 25 mg by mouth daily.     cloNIDine 0.1 MG tablet  Commonly known as:  CATAPRES  Take 0.1 mg by mouth as needed (high blood  pressure).     cloNIDine 0.1 MG tablet  Commonly known as:  CATAPRES  Take 0.5 tablets (0.05 mg total) by mouth 2 (two) times daily.     donepezil 10 MG tablet  Commonly known as:  ARICEPT  Take 10 mg by mouth at bedtime.     doxycycline 100 MG tablet  Commonly known as:   VIBRA-TABS  Take 1 tablet (100 mg total) by mouth every 12 (twelve) hours.     fish oil-omega-3 fatty acids 1000 MG capsule  Take 1 g by mouth daily.     GLUCOSAMINE HCL PO  Take 1 tablet by mouth daily.     KLOR-CON M10 10 MEQ tablet  Generic drug:  potassium chloride  Take 10 mEq by mouth daily.     labetalol 100 MG tablet  Commonly known as:  NORMODYNE  Take 1 tablet (100 mg total) by mouth 2 (two) times daily.     levothyroxine 112 MCG tablet  Commonly known as:  SYNTHROID, LEVOTHROID  Take 112 mcg by mouth daily.     lovastatin 40 MG tablet  Commonly known as:  MEVACOR  Take 80 mg by mouth at bedtime.     multivitamin with minerals Tabs tablet  Take 1 tablet by mouth daily.     RESOURCE THICKENUP CLEAR Powd  Take 1 g by mouth as needed.     testosterone cypionate 200 MG/ML injection  Commonly known as:  DEPOTESTOTERONE CYPIONATE  Inject 1 mL (200 mg total) into the muscle every 28 (twenty-eight) days.        Day of Discharge BP 155/99 mmHg  Pulse 70  Temp(Src) 96.7 F (35.9 C) (Oral)  Resp 15  Ht 6' (1.829 m)  Wt 72.8 kg (160 lb 7.9 oz)  BMI 21.76 kg/m2  SpO2 99%  Physical Exam: General: No acute respiratory distress - alert and oriented  Lungs: Clear to auscultation bilaterally without wheezes or crackles Cardiovascular: Regular rate and rhythm without murmur gallop or rub normal S1 and S2 Abdomen: Nontender, nondistended, soft, bowel sounds positive, no rebound, no ascites, no appreciable mass Extremities: No significant cyanosis, clubbing, or edema bilateral lower extremities  Basic Metabolic Panel:  Recent Labs Lab 08/14/14 1240 08/15/14 0241 08/16/14 0552 08/17/14 0235  NA 136 134* 139 143  K 3.8 3.4* 3.3* 4.0  CL 99* 99* 106 111  CO2 25 26 29 27   GLUCOSE 167* 107* 99 96  BUN 15 11 20  24*  CREATININE 1.08 0.95 1.27* 1.03  CALCIUM 9.1 8.7* 8.8* 8.6*  MG  --   --  2.0  --     Liver Function Tests:  Recent Labs Lab 08/14/14 1240  08/16/14 0552  AST 25 25  ALT 21 18  ALKPHOS 88 69  BILITOT 0.8 0.8  PROT 7.0 6.0*  ALBUMIN 3.9 3.0*    Recent Labs Lab 08/14/14 1240  LIPASE 38   Coags:  Recent Labs Lab 08/14/14 1240  INR 1.11   CBC:  Recent Labs Lab 08/14/14 1240 08/15/14 0241 08/16/14 0552  WBC 11.6* 13.3* 7.6  NEUTROABS 10.8* 11.3*  --   HGB 16.3 16.0 14.7  HCT 46.9 46.6 43.9  MCV 96.5 95.3 97.1  PLT 159 171 140*   Cardiac Enzymes:  Recent Labs Lab 08/14/14 1240  TROPONINI <0.03   CBG:  Recent Labs Lab 08/14/14 1224  GLUCAP 143*    Recent Results (from the past 240 hour(s))  Urine culture     Status: None  Collection Time: 08/14/14 12:00 PM  Result Value Ref Range Status   Specimen Description URINE, RANDOM  Final   Special Requests NONE  Final   Colony Count   Final    15,000 COLONIES/ML Performed at [facility]    Culture   Final    Multiple bacterial morphotypes present, none predominant. Suggest appropriate recollection if clinically indicated. Performed at [facility]    Report Status 08/16/2014 FINAL  Final  Culture, blood (routine x 2)     Status: None (Preliminary result)   Collection Time: 08/14/14 12:45 PM  Result Value Ref Range Status   Specimen Description BLOOD RIGHT ARM  Final   Special Requests BOTTLES DRAWN AEROBIC AND ANAEROBIC 10CC  Final   Culture   Final           BLOOD CULTURE RECEIVED NO GROWTH TO DATE CULTURE WILL BE HELD FOR 5 DAYS BEFORE ISSUING A FINAL NEGATIVE REPORT Note: Culture results may be compromised due to an excessive volume of blood received in culture bottles. Performed at [facility]    Report Status PENDING  Incomplete  Culture, blood (routine x 2)     Status: None (Preliminary result)   Collection Time: 08/14/14 12:50 PM  Result Value Ref Range Status   Specimen Description BLOOD RIGHT ARM  Final   Special Requests BOTTLES DRAWN AEROBIC AND ANAEROBIC 10CC  Final   Culture   Final            BLOOD CULTURE RECEIVED NO GROWTH TO DATE CULTURE WILL BE HELD FOR 5 DAYS BEFORE ISSUING A FINAL NEGATIVE REPORT Note: Culture results may be compromised due to an excessive volume of blood received in culture bottles. Performed at [facility]    Report Status PENDING  Incomplete  CSF culture     Status: None (Preliminary result)   Collection Time: 08/14/14  4:15 PM  Result Value Ref Range Status   Specimen Description CSF  Final   Special Requests TUBE 2 TV 1CC  Final   Gram Stain   Final    NO WBC SEEN NO SQUAMOUS EPITHELIAL CELLS SEEN NO ORGANISMS SEEN CYTOSPIN Performed at [facility]    Culture   Final    NO GROWTH 1 DAY Performed at [facility]    Report Status PENDING  Incomplete  Gram stain     Status: None   Collection Time: 08/14/14  4:15 PM  Result Value Ref Range Status   Specimen Description CSF  Final   Special Requests TUBE 2 TV 1CC  Final   Gram Stain   Final    CYTOSPIN PREP WBC PRESENT, PREDOMINANTLY MONONUCLEAR NO ORGANISMS SEEN    Report Status 08/14/2014 FINAL  Final  MRSA PCR Screening     Status: None   Collection Time: 08/14/14  5:12 PM  Result Value Ref Range Status   MRSA by PCR NEGATIVE NEGATIVE Final    Comment:        The GeneXpert MRSA Assay (FDA approved for NASAL specimens only), is one component of a comprehensive MRSA colonization surveillance program. It is not intended to diagnose MRSA infection nor to guide or monitor treatment for MRSA infections.       Time spent in discharge (includes decision making & examination of pt): >30 minutes  08/17/2014, 11:15 AM   Cherene Altes, MD [facility] Office  [phone] Pager [phone]  On-Call/Text Page:      [email or web address]  password [facility]

## 2014-08-17 NOTE — Discharge Instructions (Signed)
Hypertension °Hypertension, commonly called high blood pressure, is when the force of blood pumping through your arteries is too strong. Your arteries are the blood vessels that carry blood from your heart throughout your body. A blood pressure reading consists of a higher number over a lower number, such as 110/72. The higher number (systolic) is the pressure inside your arteries when your heart pumps. The lower number (diastolic) is the pressure inside your arteries when your heart relaxes. Ideally you want your blood pressure below 120/80. °Hypertension forces your heart to work harder to pump blood. Your arteries may become narrow or stiff. Having hypertension puts you at risk for heart disease, stroke, and other problems.  °RISK FACTORS °Some risk factors for high blood pressure are controllable. Others are not.  °Risk factors you cannot control include:  °· Race. You may be at higher risk if you are African American. °· Age. Risk increases with age. °· Gender. Men are at higher risk than women before age 45 years. After age 65, women are at higher risk than men. °Risk factors you can control include: °· Not getting enough exercise or physical activity. °· Being overweight. °· Getting too much fat, sugar, calories, or salt in your diet. °· Drinking too much alcohol. °SIGNS AND SYMPTOMS °Hypertension does not usually cause signs or symptoms. Extremely high blood pressure (hypertensive crisis) may cause headache, anxiety, shortness of breath, and nosebleed. °DIAGNOSIS  °To check if you have hypertension, your health care provider will measure your blood pressure while you are seated, with your arm held at the level of your heart. It should be measured at least twice using the same arm. Certain conditions can cause a difference in blood pressure between your right and left arms. A blood pressure reading that is higher than normal on one occasion does not mean that you need treatment. If one blood pressure reading  is high, ask your health care provider about having it checked again. °TREATMENT  °Treating high blood pressure includes making lifestyle changes and possibly taking medicine. Living a healthy lifestyle can help lower high blood pressure. You may need to change some of your habits. °Lifestyle changes may include: °· Following the DASH diet. This diet is high in fruits, vegetables, and whole grains. It is low in salt, red meat, and added sugars. °· Getting at least 2½ hours of brisk physical activity every week. °· Losing weight if necessary. °· Not smoking. °· Limiting alcoholic beverages. °· Learning ways to reduce stress. ° If lifestyle changes are not enough to get your blood pressure under control, your health care provider may prescribe medicine. You may need to take more than one. Work closely with your health care provider to understand the risks and benefits. °HOME CARE INSTRUCTIONS °· Have your blood pressure rechecked as directed by your health care provider.   °· Take medicines only as directed by your health care provider. Follow the directions carefully. Blood pressure medicines must be taken as prescribed. The medicine does not work as well when you skip doses. Skipping doses also puts you at risk for problems.   °· Do not smoke.   °· Monitor your blood pressure at home as directed by your health care provider.  °SEEK MEDICAL CARE IF:  °· You think you are having a reaction to medicines taken. °· You have recurrent headaches or feel dizzy. °· You have swelling in your ankles. °· You have trouble with your vision. °SEEK IMMEDIATE MEDICAL CARE IF: °· You develop a severe headache or confusion. °·   You have unusual weakness, numbness, or feel faint.  You have severe chest or abdominal pain.  You vomit repeatedly.  You have trouble breathing. MAKE SURE YOU:   Understand these instructions.  Will watch your condition.  Will get help right away if you are not doing well or get worse. Document  Released: 03/06/2005 Document Revised: 07/21/2013 Document Reviewed: 12/27/2012 [facility] Patient Information 2015 [place], [place]. This information is not intended to replace advice given to you by your health care provider. Make sure you discuss any questions you have with your health care provider.  [place] Spotted Fever [place] Spotted Fever (RMSF) is the oldest known tick-borne disease of people in the [place]. This disease was named because it was first described among people in the [facility] area who had an illness characterized by a rash with red-purple-black spots. This disease is caused by a rickettsia (Rickettsia rickettsii), a bacteria carried by the tick. [facility] wood tick and the American dog tick acquire and transmit the RMSF bacteria (pictures NOT actual size). When a larval, nymphal, or adult tick feeds on an infected rodent or larger animal, the tick can become infected. Infected adult ticks then feed on people who may then get RMSF. The tick transmits the disease to humans during a prolonged period of feeding that lasts many hours, days, or even a couple weeks. The bite is painless and frequently goes unnoticed. An infected male tick may also pass the rickettsial bacteria to her eggs that then may mature to be infected adult ticks. The rickettsia that causes RMSF can also get into a person's body through damaged skin. A tick bite is not necessary. People can get RMSF if they crush a tick and get its blood or body fluids on their skin through a small cut or sore.  DIAGNOSIS Diagnosis is made by laboratory tests.  TREATMENT Treatment is with antibiotics (medications that kill rickettsia and other bacteria). Immediate treatment usually prevents death. GEOGRAPHIC RANGE This disease was reported only in the [facility] until 1931. RMSF has more recently been described among individuals in all states except [place], [place], and [place]. The highest  reported incidences of RMSF now occur among residents of [place], [place], [place], and the [place]. TIME OF YEAR  Most cases are diagnosed during late spring and summer when ticks are most active. However, especially in the warmer [place] states, a few cases occur during the winter. SYMPTOMS   Symptoms of RMSF begin from 2 to 14 days after a tick bite. The most common early symptoms are fever, muscle aches, and headache followed by nausea (feeling sick to your stomach) or vomiting.  The RMSF rash is typically delayed until 3 or more days after symptom onset, and eventually develops in 9 of 10 infected patients by the fifth day of illness. If the disease is not treated it can cause death. If you get a fever, headache, muscle aches, rash, nausea, or vomiting within 2 weeks of a possible tick bite or exposure, you should see your caregiver immediately. PREVENTION Ticks prefer to hide in shady, moist ground litter. They can often be found above the ground clinging to tall grass, brush, shrubs and low tree branches. They also inhabit lawns and gardens, especially at the edges of woodlands and around old stone walls. Within the areas where ticks generally live, no naturally vegetated area can be considered completely free of infected ticks. The best precaution against RMSF is to avoid contact with soil, leaf litter,  and vegetation as much as possible in tick-infested areas. For those who enjoy gardening or walking in their yards, clear brush and mow tall grass around houses and at the edges of gardens. This may help reduce the tick population in the immediate area. Applications of chemical insecticides by a licensed professional in the spring (late May) and fall (September) will also control ticks, especially in heavily infested areas. Treatment will never get rid of all the ticks. Getting rid of small animal populations that host ticks will also decrease the tick population. When working in the garden,  [facility], or handling soil and vegetation, wear light-colored protective clothing and gloves. Spot-check often to prevent ticks from reaching the skin. Ticks cannot jump or fly. They will not drop from an above-ground perch onto a passing animal. Once a tick gains access to human skin it climbs upward until it reaches a more protected area. For example, the back of the knee, groin, navel, armpit, ears, or nape of the neck. It then begins the slow process of embedding itself in the skin. Campers, hikers, field workers, and others who spend time in wooded, brushy, or tall grassy areas can avoid exposure to ticks by using the following precautions:  Wear light-colored clothing with a tight weave to spot ticks more easily and prevent contact with the skin.  Wear long pants tucked into socks, long-sleeved shirts tucked into pants and enclosed shoes or boots along with insect repellent.  Spray clothes with insect repellent containing either DEET or Permethrin. Only DEET can be used on exposed skin. Follow the manufacturer's directions carefully.  Wear a hat and keep long hair pulled back.  Stay on cleared, well-worn trails whenever possible.  Spot-check yourself and others often for the presence of ticks on clothes. If you find one, there are likely to be others. Check thoroughly.  Remove clothes after leaving tick-infested areas. If possible, wash them to eliminate any unseen ticks. Check yourself, your children and any pets from head to toe for the presence of ticks.  Shower and shampoo. You can greatly reduce your chances of contracting RMSF if you remove attached ticks as soon as possible. Regular checks of the body, including all body sites covered by hair (head, armpits, genitals), allow removal of the tick before rickettsial transmission. To remove an attached tick, use a forceps or tweezers to detach the intact tick without leaving mouth parts in the skin. The tick bite wound should be  cleansed after tick removal. Remember the most common symptoms of RMSF are fever, muscle aches, headache, and nausea or vomiting with a later onset of rash. If you get these symptoms after a tick bite and while living in an area where RMSF is found, RMSF should be suspected. If the disease is not treated, it can cause death. See your caregiver immediately if you get these symptoms. Do this even if not aware of a tick bite. Document Released: 06/18/2000 Document Revised: 07/21/2013 Document Reviewed: 02/08/2009 [facility] Patient Information 2015 [place], [place]. This information is not intended to replace advice given to you by your health care provider. Make sure you discuss any questions you have with your health care provider.

## 2014-08-17 NOTE — Care Management Note (Signed)
Case Management Note  Patient Details  Name: Benjamin Park MRN: [id number] Date of Birth: 05-May-1940  Subjective/Objective:        Adm w encephalopathy            Action/Plan: lives w wife, pcp dr Janeice Robinson   Expected Discharge Date:                  Expected Discharge Plan:  Home/Self Care  In-House Referral:     Discharge planning Services     Post Acute Care Choice:    Choice offered to:     DME Arranged:    DME Agency:     HH Arranged:    [place] Agency:     Status of Service:     Medicare Important Message Given:    Date Medicare IM Given:    Medicare IM give by:    Date Additional Medicare IM Given:    Additional Medicare Important Message give by:     If discussed at [place] of Stay Meetings, dates discussed:    Additional Comments: ur review done  Lacretia Leigh, RN 08/17/2014, 10:39 AM

## 2014-08-18 LAB — CSF CULTURE W GRAM STAIN: Gram Stain: NONE SEEN

## 2014-08-18 LAB — CSF CULTURE: Culture: NO GROWTH

## 2014-08-19 LAB — EHRLICHIA ANTIBODY PANEL
E chaffeensis (HGE) Ab, IgG: NEGATIVE
E chaffeensis (HGE) Ab, IgM: NEGATIVE
E. Chaffeensis (HME) IgM Titer: NEGATIVE
E.Chaffeensis (HME) IgG: NEGATIVE

## 2014-08-19 LAB — ROCKY MTN SPOTTED FVR ABS PNL(IGG+IGM)
RMSF IGG: NEGATIVE
RMSF IGM: 0.4 {index} (ref 0.00–0.89)

## 2014-08-20 LAB — CULTURE, BLOOD (ROUTINE X 2)
CULTURE: NO GROWTH
Culture: NO GROWTH

## 2014-09-16 ENCOUNTER — Encounter: Payer: Self-pay | Admitting: Vascular Surgery

## 2014-09-22 ENCOUNTER — Ambulatory Visit: Payer: Medicare HMO | Admitting: Vascular Surgery

## 2014-09-22 ENCOUNTER — Encounter (HOSPITAL_COMMUNITY): Payer: Medicare HMO

## 2015-03-11 ENCOUNTER — Emergency Department (HOSPITAL_COMMUNITY): Payer: Medicare HMO

## 2015-03-11 ENCOUNTER — Encounter (HOSPITAL_COMMUNITY): Payer: Self-pay | Admitting: Emergency Medicine

## 2015-03-11 ENCOUNTER — Inpatient Hospital Stay (HOSPITAL_COMMUNITY)
Admission: EM | Admit: 2015-03-11 | Discharge: 2015-03-18 | DRG: 077 | Disposition: A | Discharge status: Home Health | Payer: Medicare HMO | Attending: Internal Medicine | Admitting: Internal Medicine

## 2015-03-11 DIAGNOSIS — Z79899 Other long term (current) drug therapy: Secondary | ICD-10-CM

## 2015-03-11 DIAGNOSIS — R1310 Dysphagia, unspecified: Secondary | ICD-10-CM

## 2015-03-11 DIAGNOSIS — R471 Dysarthria and anarthria: Secondary | ICD-10-CM | POA: Diagnosis present

## 2015-03-11 DIAGNOSIS — I252 Old myocardial infarction: Secondary | ICD-10-CM | POA: Diagnosis not present

## 2015-03-11 DIAGNOSIS — E872 Acidosis, unspecified: Secondary | ICD-10-CM | POA: Insufficient documentation

## 2015-03-11 DIAGNOSIS — Z87891 Personal history of nicotine dependence: Secondary | ICD-10-CM | POA: Diagnosis not present

## 2015-03-11 DIAGNOSIS — I674 Hypertensive encephalopathy: Principal | ICD-10-CM | POA: Diagnosis present

## 2015-03-11 DIAGNOSIS — Z85819 Personal history of malignant neoplasm of unspecified site of lip, oral cavity, and pharynx: Secondary | ICD-10-CM | POA: Diagnosis not present

## 2015-03-11 DIAGNOSIS — Z7982 Long term (current) use of aspirin: Secondary | ICD-10-CM

## 2015-03-11 DIAGNOSIS — Z452 Encounter for adjustment and management of vascular access device: Secondary | ICD-10-CM

## 2015-03-11 DIAGNOSIS — N179 Acute kidney failure, unspecified: Secondary | ICD-10-CM | POA: Diagnosis present

## 2015-03-11 DIAGNOSIS — Z4659 Encounter for fitting and adjustment of other gastrointestinal appliance and device: Secondary | ICD-10-CM

## 2015-03-11 DIAGNOSIS — Z9289 Personal history of other medical treatment: Secondary | ICD-10-CM

## 2015-03-11 DIAGNOSIS — E785 Hyperlipidemia, unspecified: Secondary | ICD-10-CM | POA: Diagnosis present

## 2015-03-11 DIAGNOSIS — Z8774 Personal history of (corrected) congenital malformations of heart and circulatory system: Secondary | ICD-10-CM | POA: Diagnosis not present

## 2015-03-11 DIAGNOSIS — I2510 Atherosclerotic heart disease of native coronary artery without angina pectoris: Secondary | ICD-10-CM | POA: Diagnosis present

## 2015-03-11 DIAGNOSIS — I63321 Cerebral infarction due to thrombosis of right anterior cerebral artery: Secondary | ICD-10-CM | POA: Diagnosis not present

## 2015-03-11 DIAGNOSIS — E876 Hypokalemia: Secondary | ICD-10-CM | POA: Diagnosis present

## 2015-03-11 DIAGNOSIS — R4182 Altered mental status, unspecified: Secondary | ICD-10-CM

## 2015-03-11 DIAGNOSIS — E039 Hypothyroidism, unspecified: Secondary | ICD-10-CM | POA: Diagnosis present

## 2015-03-11 DIAGNOSIS — G4733 Obstructive sleep apnea (adult) (pediatric): Secondary | ICD-10-CM | POA: Diagnosis present

## 2015-03-11 DIAGNOSIS — J9690 Respiratory failure, unspecified, unspecified whether with hypoxia or hypercapnia: Secondary | ICD-10-CM | POA: Insufficient documentation

## 2015-03-11 DIAGNOSIS — I959 Hypotension, unspecified: Secondary | ICD-10-CM | POA: Diagnosis not present

## 2015-03-11 DIAGNOSIS — R739 Hyperglycemia, unspecified: Secondary | ICD-10-CM | POA: Diagnosis present

## 2015-03-11 DIAGNOSIS — Z923 Personal history of irradiation: Secondary | ICD-10-CM

## 2015-03-11 DIAGNOSIS — G20 Parkinson's disease: Secondary | ICD-10-CM | POA: Diagnosis present

## 2015-03-11 DIAGNOSIS — J9601 Acute respiratory failure with hypoxia: Secondary | ICD-10-CM | POA: Diagnosis not present

## 2015-03-11 DIAGNOSIS — Z8673 Personal history of transient ischemic attack (TIA), and cerebral infarction without residual deficits: Secondary | ICD-10-CM

## 2015-03-11 DIAGNOSIS — E440 Moderate protein-calorie malnutrition: Secondary | ICD-10-CM | POA: Diagnosis present

## 2015-03-11 DIAGNOSIS — R509 Fever, unspecified: Secondary | ICD-10-CM | POA: Diagnosis not present

## 2015-03-11 DIAGNOSIS — I10 Essential (primary) hypertension: Secondary | ICD-10-CM | POA: Diagnosis not present

## 2015-03-11 DIAGNOSIS — J9621 Acute and chronic respiratory failure with hypoxia: Secondary | ICD-10-CM | POA: Diagnosis present

## 2015-03-11 DIAGNOSIS — I169 Hypertensive crisis, unspecified: Secondary | ICD-10-CM | POA: Diagnosis present

## 2015-03-11 DIAGNOSIS — Z8249 Family history of ischemic heart disease and other diseases of the circulatory system: Secondary | ICD-10-CM | POA: Diagnosis not present

## 2015-03-11 DIAGNOSIS — G9340 Encephalopathy, unspecified: Secondary | ICD-10-CM | POA: Diagnosis not present

## 2015-03-11 LAB — CBC WITH DIFFERENTIAL/PLATELET
BASOS ABS: 0 10*3/uL (ref 0.0–0.1)
BASOS PCT: 0 %
Basophils Absolute: 0 10*3/uL (ref 0.0–0.1)
Basophils Relative: 0 %
EOS ABS: 0 10*3/uL (ref 0.0–0.7)
EOS ABS: 0 10*3/uL (ref 0.0–0.7)
EOS PCT: 0 %
Eosinophils Relative: 0 %
HEMATOCRIT: 50.3 % (ref 39.0–52.0)
HEMATOCRIT: 48 % (ref 39.0–52.0)
HEMOGLOBIN: 16.4 g/dL (ref 13.0–17.0)
Hemoglobin: 18 g/dL — ABNORMAL HIGH (ref 13.0–17.0)
LYMPHS ABS: 1 10*3/uL (ref 0.7–4.0)
Lymphocytes Relative: 6 %
Lymphocytes Relative: 7 %
Lymphs Abs: 0.9 10*3/uL (ref 0.7–4.0)
MCH: 34.2 pg — ABNORMAL HIGH (ref 26.0–34.0)
MCH: 32.8 pg (ref 26.0–34.0)
MCHC: 35.8 g/dL (ref 30.0–36.0)
MCHC: 34.2 g/dL (ref 30.0–36.0)
MCV: 95.6 fL (ref 78.0–100.0)
MCV: 96 fL (ref 78.0–100.0)
MONO ABS: 0.7 10*3/uL (ref 0.1–1.0)
Monocytes Absolute: 0.5 10*3/uL (ref 0.1–1.0)
Monocytes Relative: 4 %
Monocytes Relative: 3 %
NEUTROS ABS: 12.1 10*3/uL — AB (ref 1.7–7.7)
NEUTROS PCT: 90 %
NEUTROS PCT: 90 %
Neutro Abs: 15.1 10*3/uL — ABNORMAL HIGH (ref 1.7–7.7)
PLATELETS: 228 10*3/uL (ref 150–400)
Platelets: 169 10*3/uL (ref 150–400)
RBC: 5.26 MIL/uL (ref 4.22–5.81)
RBC: 5 MIL/uL (ref 4.22–5.81)
RDW: 12.7 % (ref 11.5–15.5)
RDW: 12.9 % (ref 11.5–15.5)
WBC Morphology: INCREASED
WBC: 16.8 10*3/uL — AB (ref 4.0–10.5)
WBC: 13.5 10*3/uL — ABNORMAL HIGH (ref 4.0–10.5)

## 2015-03-11 LAB — URINALYSIS, ROUTINE W REFLEX MICROSCOPIC
BILIRUBIN URINE: NEGATIVE
GLUCOSE, UA: 250 mg/dL — AB
Leukocytes, UA: NEGATIVE
NITRITE: NEGATIVE
PH: 7 (ref 5.0–8.0)
Protein, ur: >300 mg/dL — AB
SPECIFIC GRAVITY, URINE: 1.02 (ref 1.005–1.030)

## 2015-03-11 LAB — BLOOD GAS, ARTERIAL
ACID-BASE EXCESS: 0.5 mmol/L (ref 0.0–2.0)
BICARBONATE: 24 meq/L (ref 20.0–24.0)
Drawn by: 441351
FIO2: 0.21
O2 SAT: 95.3 %
PATIENT TEMPERATURE: 100.2
TCO2: 25 mmol/L (ref 0–100)
pCO2 arterial: 36 mmHg (ref 35.0–45.0)
pH, Arterial: 7.443 (ref 7.350–7.450)
pO2, Arterial: 78.9 mmHg — ABNORMAL LOW (ref 80.0–100.0)

## 2015-03-11 LAB — I-STAT ARTERIAL BLOOD GAS, ED
Acid-base deficit: 2 mmol/L (ref 0.0–2.0)
Bicarbonate: 22.1 mEq/L (ref 20.0–24.0)
O2 SAT: 96 %
PCO2 ART: 34 mmHg — AB (ref 35.0–45.0)
PO2 ART: 77 mmHg — AB (ref 80.0–100.0)
TCO2: 23 mmol/L (ref 0–100)
pH, Arterial: 7.421 (ref 7.350–7.450)

## 2015-03-11 LAB — CG4 I-STAT (LACTIC ACID): Lactic Acid, Venous: 5.35 mmol/L (ref 0.5–2.0)

## 2015-03-11 LAB — PROTIME-INR
INR: 1.07 (ref 0.00–1.49)
PROTHROMBIN TIME: 14.1 s (ref 11.6–15.2)

## 2015-03-11 LAB — COMPREHENSIVE METABOLIC PANEL
ALBUMIN: 4.3 g/dL (ref 3.5–5.0)
ALK PHOS: 90 U/L (ref 38–126)
ALT: 23 U/L (ref 17–63)
ANION GAP: 13 (ref 5–15)
AST: 43 U/L — ABNORMAL HIGH (ref 15–41)
BILIRUBIN TOTAL: 1.1 mg/dL (ref 0.3–1.2)
BUN: 23 mg/dL — ABNORMAL HIGH (ref 6–20)
CALCIUM: 9.6 mg/dL (ref 8.9–10.3)
CO2: 25 mmol/L (ref 22–32)
Chloride: 98 mmol/L — ABNORMAL LOW (ref 101–111)
Creatinine, Ser: 1.14 mg/dL (ref 0.61–1.24)
GFR calc non Af Amer: >60 mL/min (ref >60)
GLUCOSE: 175 mg/dL — AB (ref 65–99)
POTASSIUM: 3.9 mmol/L (ref 3.5–5.1)
Sodium: 136 mmol/L (ref 135–145)
TOTAL PROTEIN: 8 g/dL (ref 6.5–8.1)

## 2015-03-11 LAB — URINE MICROSCOPIC-ADD ON

## 2015-03-11 LAB — POCT I-STAT TROPONIN I: TROPONIN I, POC: 0.04 ng/mL (ref 0.00–0.08)

## 2015-03-11 LAB — I-STAT CG4 LACTIC ACID, ED: Lactic Acid, Venous: 2.61 mmol/L (ref 0.5–2.0)

## 2015-03-11 LAB — APTT: APTT: 35 s (ref 24–37)

## 2015-03-11 LAB — PROCALCITONIN

## 2015-03-11 LAB — I-STAT TROPONIN, ED: TROPONIN I, POC: 0.02 ng/mL (ref 0.00–0.08)

## 2015-03-11 LAB — LACTIC ACID, PLASMA: LACTIC ACID, VENOUS: 2.3 mmol/L — AB (ref 0.5–2.0)

## 2015-03-11 MED ORDER — PIPERACILLIN-TAZOBACTAM 3.375 G IVPB
3.3750 g | Freq: Once | INTRAVENOUS | Status: DC
  Filled 2015-03-11: qty 50

## 2015-03-11 MED ORDER — PIPERACILLIN-TAZOBACTAM 3.375 G IVPB
3.3750 g | Freq: Three times a day (TID) | INTRAVENOUS | Status: DC
  Filled 2015-03-11 (×2): qty 50

## 2015-03-11 MED ORDER — PIPERACILLIN-TAZOBACTAM 3.375 G IVPB 30 MIN
3.3750 g | Freq: Once | INTRAVENOUS | Status: AC
  Administered 2015-03-11: 3.375 g via INTRAVENOUS

## 2015-03-11 MED ORDER — SODIUM CHLORIDE 0.9 % IV BOLUS (SEPSIS)
1000.0000 mL | Freq: Once | INTRAVENOUS | Status: AC
  Administered 2015-03-11: 1000 mL via INTRAVENOUS

## 2015-03-11 MED ORDER — DEXTROSE 5 % IV SOLN
700.0000 mg | Freq: Three times a day (TID) | INTRAVENOUS | Status: DC
  Administered 2015-03-11 – 2015-03-14 (×8): 700 mg via INTRAVENOUS
  Filled 2015-03-11 (×11): qty 14

## 2015-03-11 MED ORDER — SODIUM CHLORIDE 0.9 % IV BOLUS (SEPSIS)
2000.0000 mL | Freq: Once | INTRAVENOUS | Status: AC
  Administered 2015-03-11: 2000 mL via INTRAVENOUS

## 2015-03-11 MED ORDER — SODIUM CHLORIDE 0.9 % IV BOLUS (SEPSIS)
1000.0000 mL | Freq: Once | INTRAVENOUS | Status: AC
  Administered 2015-03-12: 1000 mL via INTRAVENOUS

## 2015-03-11 MED ORDER — SODIUM CHLORIDE 0.9 % IV SOLN
2.0000 g | INTRAVENOUS | Status: DC
  Administered 2015-03-11 – 2015-03-14 (×16): 2 g via INTRAVENOUS
  Filled 2015-03-11 (×19): qty 2000

## 2015-03-11 MED ORDER — LORAZEPAM 2 MG/ML IJ SOLN
INTRAMUSCULAR | Status: AC
  Administered 2015-03-11: 1 mg
  Filled 2015-03-11: qty 1

## 2015-03-11 MED ORDER — DEXTROSE 50 % IV SOLN
INTRAVENOUS | Status: AC
  Filled 2015-03-11: qty 50

## 2015-03-11 MED ORDER — DEXTROSE 5 % IV SOLN
2.0000 g | Freq: Two times a day (BID) | INTRAVENOUS | Status: DC
  Administered 2015-03-11 – 2015-03-15 (×8): 2 g via INTRAVENOUS
  Filled 2015-03-11 (×9): qty 2

## 2015-03-11 MED ORDER — VANCOMYCIN HCL 500 MG IV SOLR
500.0000 mg | Freq: Once | INTRAVENOUS | Status: AC
  Administered 2015-03-11: 500 mg via INTRAVENOUS
  Filled 2015-03-11: qty 500

## 2015-03-11 MED ORDER — VANCOMYCIN HCL IN DEXTROSE 750-5 MG/150ML-% IV SOLN
750.0000 mg | Freq: Two times a day (BID) | INTRAVENOUS | Status: DC
  Administered 2015-03-12 – 2015-03-13 (×3): 750 mg via INTRAVENOUS
  Filled 2015-03-11 (×6): qty 150

## 2015-03-11 MED ORDER — VANCOMYCIN HCL IN DEXTROSE 1-5 GM/200ML-% IV SOLN
1000.0000 mg | Freq: Once | INTRAVENOUS | Status: AC
  Administered 2015-03-11: 1000 mg via INTRAVENOUS
  Filled 2015-03-11 (×2): qty 200

## 2015-03-11 MED ORDER — LABETALOL HCL 5 MG/ML IV SOLN
10.0000 mg | Freq: Once | INTRAVENOUS | Status: AC
  Administered 2015-03-11: 10 mg via INTRAVENOUS
  Filled 2015-03-11: qty 4

## 2015-03-11 NOTE — Progress Notes (Signed)
Was notified by nurse that pt febrile, AMS, signs of sepsis. Chart reviewed as this PA off site. WBC elevated, lactate elevated, bandemia. Has had 1.5L NS since arrival. Spoke with Dr. Hal Hope who evaluated patient at bedside and discussed with CCM. He stated that we will bolus with additional 3L NS and recheck lactate, if lactate not trending down will discuss again with CCM further management. Other labs ordered including repeat CBC, CMET, ABG and sepsis protocol initiated. Currently receiving vanc/zosyn.

## 2015-03-11 NOTE — ED Notes (Signed)
Family at bedside. 

## 2015-03-11 NOTE — ED Notes (Signed)
From home via [facility], altered since yesterday per family with declining mental status, HTN, vomited pta, urinary incontinence, tachy,   119 179/120 CBG 144  4mg  Zofran

## 2015-03-11 NOTE — Progress Notes (Signed)
Received patient from ED; placed on CCM and CCMD notified of adm. VS, CHG, wgt done. Noted 100.2 ax temp. Pt unresponsive. Noted abrasion to rt forehead but wife verifies that he had skin cancer removed approx 3 weeks ago.

## 2015-03-11 NOTE — ED Provider Notes (Signed)
Care of pt taken over from Dr. Sherian Maroon at 1600.  Pt brought in via EMS in setting of AMS for 2 days.  Pt normally able to perform ADLs.  Pt started with headache yesterday and had elevated BP and became altered overnight.  Pt found in own urine today by EMS.  CT head without significant abnormality. Pt has received vanc/zosyn and labetalol for elevated BP.  Lactate elevated and patient has received IV fluids.  Plan is to await lab analysis and patient will likely be admitted to step down.  No obvious source of infection on labs and BP improving.  Pt stable at time of admission to step down unit under medicine team care.  Esaw Grandchild, MD 03/11/15 [id number]  Jola Schmidt, MD 03/12/15 [phone]

## 2015-03-11 NOTE — ED Provider Notes (Signed)
The pt arrives with AMS, with severe hypertension, on exam is agitated, confused not speaking or answering questions but is groaning, moving all extremities, is breathing adequetely and spontaneously.  Has severe hypertension.  EMR reviewed and shows that the pt has hx of prior hypertensive encephalopathy with admit most recently in May during which time he resolved spontaneously.  I saw and evaluated the patient, reviewed the resident's note and I agree with the findings and plan.   EKG Interpretation  Date/Time:  Thursday March 11 2015 14:45:55 EST Ventricular Rate:  122 PR Interval:  183 QRS Duration: 97 QT Interval:  303 QTC Calculation: 432 R Axis:   103 Text Interpretation:  Sinus tachycardia Consider right atrial enlargement Right axis deviation Minimal ST depression, anterolateral leads Minimal ST elevation, inferior leads Since last tracing rate faster Abnormal ekg Confirmed by Sabra Heck  MD, Esmee Fallaw ([id number]) on 03/11/2015 3:08:17 PM       Final diagnoses:  Altered mental status, unspecified altered mental status type  Lactic acid acidosis      Meds given in ED:  Medications  aspirin tablet 325 mg (325 mg Oral Given 03/13/15 0948)  heparin injection 5,000 Units (5,000 Units Subcutaneous Given 03/13/15 1410)  sodium chloride 0.9 % injection 3 mL (3 mLs Intravenous Given 03/12/15 2200)  polyethylene glycol (MIRALAX / GLYCOLAX) packet 17 g (not administered)  ondansetron (ZOFRAN) tablet 4 mg (not administered)    Or  ondansetron (ZOFRAN) injection 4 mg (not administered)  labetalol (NORMODYNE,TRANDATE) injection 10 mg (not administered)  ampicillin (OMNIPEN) 2 g in sodium chloride 0.9 % 50 mL IVPB (2 g Intravenous Given 03/13/15 1209)  vancomycin (VANCOCIN) IVPB 750 mg/150 ml premix (750 mg Intravenous Given 03/13/15 0539)  cefTRIAXone (ROCEPHIN) 2 g in dextrose 5 % 50 mL IVPB (2 g Intravenous Given 03/13/15 1023)  acyclovir (ZOVIRAX) 700 mg in dextrose 5 % 100 mL IVPB  (700 mg Intravenous Given 03/13/15 1410)  insulin aspart (novoLOG) injection 2-6 Units (2 Units Subcutaneous Not Given 03/13/15 1200)  fentaNYL (SUBLIMAZE) injection 25-50 mcg (50 mcg Intravenous Given 03/13/15 0500)  feeding supplement (VITAL AF 1.2 CAL) liquid 1,000 mL (1,000 mLs Per Tube Rate/Dose Verify 03/13/15 1200)  DOPamine (INTROPIN) 3.2-5 MG/ML-% infusion (not administered)  midazolam (VERSED) 2 MG/2ML injection (not administered)  norepinephrine (LEVOPHED) 4 mg in dextrose 5 % 250 mL (0.016 mg/mL) infusion (0 mcg/min Intravenous Stopped 03/13/15 1000)  midazolam (VERSED) injection 1-4 mg (4 mg Intravenous Given 03/13/15 0200)  pantoprazole sodium (PROTONIX) 40 mg/20 mL oral suspension 40 mg (40 mg Per Tube Given 03/13/15 0948)  levothyroxine (SYNTHROID, LEVOTHROID) tablet 112 mcg (112 mcg Oral Given 03/13/15 0948)  potassium chloride 10 mEq in 50 mL *CENTRAL LINE* IVPB (10 mEq Intravenous Given 03/13/15 1411)  potassium phosphate 30 mmol in dextrose 5 % 500 mL infusion (30 mmol Intravenous Given 03/13/15 1430)  antiseptic oral rinse (CPC / CETYLPYRIDINIUM CHLORIDE 0.05%) solution 7 mL (not administered)  LORazepam (ATIVAN) 2 MG/ML injection (1 mg  Given 03/11/15 1511)  sodium chloride 0.9 % bolus 1,000 mL (0 mLs Intravenous Stopped 03/11/15 1637)  labetalol (NORMODYNE,TRANDATE) injection 10 mg (10 mg Intravenous Given 03/11/15 1631)  vancomycin (VANCOCIN) IVPB 1000 mg/200 mL premix (0 mg Intravenous Stopped 03/11/15 1830)  piperacillin-tazobactam (ZOSYN) IVPB 3.375 g (0 g Intravenous Stopped 03/11/15 1712)  sodium chloride 0.9 % bolus 2,000 mL (2,000 mLs Intravenous Given 03/11/15 2028)  vancomycin (VANCOCIN) 500 mg in sodium chloride 0.9 % 100 mL IVPB (500 mg Intravenous Given 03/11/15 2045)  sodium chloride 0.9 % bolus 1,000 mL (1,000 mLs Intravenous Given 03/12/15 0024)  etomidate (AMIDATE) injection 20 mg (20 mg Intravenous Given 03/12/15 0314)  rocuronium (ZEMURON) injection 40  mg (40 mg Intravenous Given 03/12/15 0314)  potassium chloride 10 mEq in 100 mL IVPB (10 mEq Intravenous Given 03/12/15 1217)  sodium chloride 0.9 % bolus 1,000 mL (1,000 mLs Intravenous Given 03/12/15 0537)  lidocaine (XYLOCAINE) 2 % viscous mouth solution (  Given 03/12/15 1340)  sodium chloride 0.9 % bolus 1,000 mL (1,000 mLs Intravenous Given 03/12/15 1731)  fentaNYL (SUBLIMAZE) injection 50 mcg (50 mcg Intravenous Given 03/12/15 1827)  midazolam (VERSED) injection 4 mg (4 mg Intravenous Given 03/12/15 1827)  midazolam (VERSED) injection 4 mg (4 mg Intravenous Given 03/13/15 0059)  lactated ringers bolus 1,000 mL (1,000 mLs Intravenous Given 03/12/15 2349)  midazolam (VERSED) 2 MG/2ML injection (  Duplicate 0000000 [id number])  lactated ringers bolus 1,000 mL (1,000 mLs Intravenous Given 03/13/15 0346)  magnesium sulfate IVPB 2 g 50 mL (2 g Intravenous Given 03/13/15 0439)  potassium chloride 20 MEQ/15ML (10%) solution 40 mEq (40 mEq Per Tube Given 03/13/15 0438)  potassium phosphate 30 mmol in dextrose 5 % 500 mL infusion (30 mmol Intravenous Given 03/13/15 0500)      Benjamin Chapel, MD 03/13/15 1530

## 2015-03-11 NOTE — ED Provider Notes (Signed)
CSN: [id number]     Arrival date & time 03/11/15  1440 History   First MD Initiated Contact with Patient 03/11/15 1446     Chief Complaint  Patient presents with  . Altered Mental Status   74 yo M w/PMH Of TIA, HLD, hypothyroidism, CAD who presents by EMS by AMS. Family report that he is normally highly functional, ambulatory, performs his own ADLs, no deficits. However, yesterday he started becoming altered, with worsening today. In the past, apparently he's had episodes like this 2/2 hypertensive emergency. EMS report his BPs in the 170s/100s, tachy in the 115s. They found him in his own urine. No reported CP, SOB, fever, chills, N/V, diarrhea, constipation, hematemesis, dysuria, hematuria, sick contacts, or recent travel.   Patient is a 74 y.o. male presenting with altered mental status. The history is provided by the EMS personnel.  Altered Mental Status Presenting symptoms: behavior changes, disorientation, lethargy and partial responsiveness   Severity:  Moderate Most recent episode:  Today Episode history:  Continuous Duration:  2 days Timing:  Constant Progression:  Worsening Chronicity:  New Associated symptoms: weakness   Associated symptoms: no abdominal pain, no fever, no headaches, no light-headedness, no nausea, no palpitations, no rash, no seizures and no vomiting     Past Medical History  Diagnosis Date  . Hx of transient ischemic attack (TIA)   . Hyperlipidemia   . ED (erectile dysfunction)   . Personal history of kidney stones   . Subarachnoid hemorrhage ([place])     '11- hospitalized -3 to 4 days(passed out-awoke in hospital)- no residual affects.  . Myocardial infarction ([place])     12/1991  . Dysphagia   . Hypokalemia   . Hypothyroidism   . Sleep apnea     on CPAP-settings 6  . History of kidney stones 02-27-13    x5- none recent  . Throat cancer [facility])     '00-radiation therapy  . Stroke ([place])   . Unspecified cerebral artery occlusion with cerebral  infarction 11/08/2012  . Episodic confusion 02-27-13    hx. of, not currently(multiple episodes- ? TIA's)-always saw MD to be evaluated.  Marland Kitchen AKI (acute kidney injury) ([place]) 02/23/2011    pt. denies any problems 02-27-13  . Hypertension     hx. labile - with spikes every 3-6 mos.-last 8'14 ED visit.  Marland Kitchen Hypertensive encephalopathy   . Headache(784.0)     episodic with periods of confusion -multiple  . Neuromuscular disorder ([place])     "parkinson's" -no tremor-sees neurology MD at [facility]  . Coronary artery disease     Dr. Martinique   Past Surgical History  Procedure Laterality Date  . Angioplasty  1993    LAD  . Cardiac catheterization    . Coronary angioplasty    . Basal cell cancer      head  . Hernia repair    . Appendectomy    . Patent foramen ovale closure  2008  . Eye surgery    . Colonoscopy N/A 06/18/2012    Procedure: COLONOSCOPY;  Surgeon: Jeryl Columbia, MD;  Location: [facility] ENDOSCOPY;  Service: Endoscopy;  Laterality: N/A;  h&p in file-[place]   . Hot hemostasis N/A 06/18/2012    Procedure: HOT HEMOSTASIS (ARGON PLASMA COAGULATION/BICAP);  Surgeon: Jeryl Columbia, MD;  Location: [facility] ENDOSCOPY;  Service: Endoscopy;  Laterality: N/A;  . Colonoscopy with propofol N/A 03/07/2013    Procedure: COLONOSCOPY WITH PROPOFOL;  Surgeon: Jeryl Columbia, MD;  Location: [facility] ENDOSCOPY;  Service: Endoscopy;  Laterality: N/A;   Family History  Problem Relation Age of Onset  . Heart failure Father   . Heart attack Brother   . Anemia Brother 104    mi   Social History  Substance Use Topics  . Smoking status: Former Smoker    Types: Cigarettes    Quit date: 09/21/1990  . Smokeless tobacco: Never Used  . Alcohol Use: No    Review of Systems  Constitutional: Negative for fever and chills.  Respiratory: Negative for shortness of breath.   Cardiovascular: Negative for chest pain, palpitations and leg swelling.  Gastrointestinal: Negative for nausea, vomiting, abdominal pain, diarrhea, constipation and  abdominal distention.  Genitourinary: Negative for dysuria, frequency, flank pain and decreased urine volume.  Skin: Negative for rash and wound.  Neurological: Positive for weakness. Negative for dizziness, seizures, facial asymmetry, speech difficulty, light-headedness and headaches.       Altered mental status  All other systems reviewed and are negative.     Allergies  Review of patient's allergies indicates no known allergies.  Home Medications   Prior to Admission medications   Medication Sig Start Date End Date Taking? Authorizing Provider  aspirin 325 MG tablet Take 325 mg by mouth daily.      Historical Provider, MD  chlorthalidone (HYGROTON) 25 MG tablet Take 25 mg by mouth daily. 05/28/14   Historical Provider, MD  cloNIDine (CATAPRES) 0.1 MG tablet Take 0.1 mg by mouth as needed (high blood pressure).  11/29/13   Historical Provider, MD  cloNIDine (CATAPRES) 0.1 MG tablet Take 0.5 tablets (0.05 mg total) by mouth 2 (two) times daily. 08/17/14   Cherene Altes, MD  donepezil (ARICEPT) 10 MG tablet Take 10 mg by mouth at bedtime. 07/23/14   Historical Provider, MD  doxycycline (VIBRA-TABS) 100 MG tablet Take 1 tablet (100 mg total) by mouth every 12 (twelve) hours. 08/17/14   Cherene Altes, MD  fish oil-omega-3 fatty acids 1000 MG capsule Take 1 g by mouth daily.     Historical Provider, MD  GLUCOSAMINE HCL PO Take 1 tablet by mouth daily.    Historical Provider, MD  KLOR-CON M10 10 MEQ tablet Take 10 mEq by mouth daily. 05/12/14   Historical Provider, MD  labetalol (NORMODYNE) 100 MG tablet Take 1 tablet (100 mg total) by mouth 2 (two) times daily. 02/03/14   Bobby Rumpf [place], PA-C  levothyroxine (SYNTHROID, LEVOTHROID) 112 MCG tablet Take 112 mcg by mouth daily.  12/11/13   Historical Provider, MD  lovastatin (MEVACOR) 40 MG tablet Take 80 mg by mouth at bedtime.     Historical Provider, MD  Maltodextrin-Xanthan Gum (RESOURCE THICKENUP CLEAR) POWD Take 1 g by mouth as needed.  02/03/14   Melton Alar, PA-C  Multiple Vitamin (MULITIVITAMIN WITH MINERALS) TABS Take 1 tablet by mouth daily.      Historical Provider, MD  testosterone cypionate (DEPOTESTOTERONE CYPIONATE) 200 MG/ML injection Inject 1 mL (200 mg total) into the muscle every 28 (twenty-eight) days. 11/20/12   Mahima Pandey, MD   BP 156/126 mmHg  Pulse 120  Temp(Src) 96.4 F (35.8 C) (Rectal)  Resp 22  SpO2 96% Physical Exam  Constitutional: He appears well-developed and well-nourished. No distress.  HENT:  Head: Normocephalic and atraumatic.  Eyes: Pupils are equal, round, and reactive to light.  Neck: Normal range of motion.  Cardiovascular: Regular rhythm, normal heart sounds and intact distal pulses.  Exam reveals no gallop and no friction rub.   No murmur heard. tachy  Pulmonary/Chest: Effort normal and breath sounds normal. No respiratory distress. He has no wheezes. He has no rales. He exhibits no tenderness.  Abdominal: Soft. Bowel sounds are normal. He exhibits no distension and no mass. There is no tenderness. There is no rebound and no guarding.  Musculoskeletal: Normal range of motion.  Lymphadenopathy:    He has no cervical adenopathy.  Neurological:  Opens eyes spontaneously. Not following commands. No obvious seizures activity. Moving limbs spontaneously.  Skin: Skin is warm and dry. No rash noted. He is not diaphoretic.  Nursing note and vitals reviewed.   ED Course  Procedures (including critical care time) Labs Review Labs Reviewed  COMPREHENSIVE METABOLIC PANEL - Abnormal; Notable for the following:    Chloride 98 (*)    Glucose, Bld 175 (*)    BUN 23 (*)    AST 43 (*)    All other components within normal limits  CBC WITH DIFFERENTIAL/PLATELET - Abnormal; Notable for the following:    WBC 16.8 (*)    Hemoglobin 18.0 (*)    MCH 34.2 (*)    Neutro Abs 15.1 (*)    All other components within normal limits  I-STAT CG4 LACTIC ACID, ED - Abnormal; Notable for the  following:    Lactic Acid, Venous 2.61 (*)    All other components within normal limits  URINE CULTURE  CULTURE, BLOOD (ROUTINE X 2)  CULTURE, BLOOD (ROUTINE X 2)  PROTIME-INR  URINALYSIS, ROUTINE W REFLEX MICROSCOPIC (NOT AT [facility])  BLOOD GAS, ARTERIAL  CBG MONITORING, ED  I-STAT TROPOININ, ED    Imaging Review Ct Head Wo Contrast  03/11/2015  CLINICAL DATA:  Altered mental status.  Incontinence and vomiting. EXAM: CT HEAD WITHOUT CONTRAST TECHNIQUE: Contiguous axial images were obtained from the base of the skull through the vertex without contrast. COMPARISON:  08/14/2014 FINDINGS: There is extensive cerebral and cerebellar atrophy which is unchanged. Again noted is low density throughout the white matter which is similar to the previous examination. No evidence for acute hemorrhage, mass lesion, midline shift, hydrocephalus or new large infarct. Mucosal disease in the maxillary sinuses, left side greater than right. Some mucosal disease in the anterior ethmoid air cells. Negative for a calvarial fracture. IMPRESSION: No acute intracranial abnormality. Stable cerebral and cerebellar atrophy. Diffuse white matter changes likely related to chronic small vessel ischemic disease. Electronically Signed   By: Markus Daft M.D.   On: 03/11/2015 16:01   Dg Chest Portable 1 View  03/11/2015  CLINICAL DATA:  Tachycardia with hypertension. Altered mental status. EXAM: PORTABLE CHEST 1 VIEW COMPARISON:  08/14/2014 FINDINGS: Patient is rotated towards the left on this examination. Evidence for foramen ovale closure device in the heart. Lungs are clear without airspace disease or pulmonary edema. Negative for a pneumothorax. No acute bone abnormality. Difficult to evaluate heart size due to patient rotation. IMPRESSION: No acute chest findings. Electronically Signed   By: Markus Daft M.D.   On: 03/11/2015 15:13   I have personally reviewed and evaluated these images and lab results as part of my medical  decision-making.   EKG Interpretation   Date/Time:  Thursday March 11 2015 14:45:55 EST Ventricular Rate:  122 PR Interval:  183 QRS Duration: 97 QT Interval:  303 QTC Calculation: 432 R Axis:   103 Text Interpretation:  Sinus tachycardia Consider right atrial enlargement  Right axis deviation Minimal ST depression, anterolateral leads Minimal ST  elevation, inferior leads Since last tracing rate faster Abnormal ekg  Confirmed by  MILLER  MD, Aaron Edelman ([id number]) on 03/11/2015 3:08:17 PM      MDM   Final diagnoses:  Altered mental status, unspecified altered mental status type  Lactic acid acidosis   74 yo M w/AMS. Please see HPI for details. On exam, NAD, altered, not following commands but protecting airway. Tachy and hypertensive. Remainder of exam beningn and as above.   DDx includes sepsis/infxn, stroke, PRES. Will obtain infectious labs, CXR, and Head CT to evaluate. Given ativan to comply with workup. Given labetalol for BP control. Vanc/Zosyn for possible infxn: lactic acid >2.5.  Pt's care handed over to Dr. Jearld Pies at [place] remainder of workup before admission. Anticipate higher level of care.  Please see his note for further details regarding his ED course.   Pt was seen under the supervision of Dr. Sabra Heck.     Sherian Maroon, MD 03/11/15 1605  Noemi Chapel, MD 03/13/15 [phone]

## 2015-03-11 NOTE — Progress Notes (Addendum)
ANTIBIOTIC CONSULT NOTE - INITIAL  Pharmacy Consult for vanc/CTX/acyclovir Indication: r/o meningitis  No Known Allergies  Patient Measurements: Height: 6' (182.9 cm) Weight: 155 lb 13.8 oz (70.7 kg) IBW/kg (Calculated) : 77.6   Vital Signs: Temp: 100.2 F (37.9 C) (12/22 1900) Temp Source: Axillary (12/22 1900) BP: 154/109 mmHg (12/22 1900) Pulse Rate: 95 (12/22 1900) Intake/Output from previous day:   Intake/Output from this shift:    Labs:  Recent Labs  03/11/15 1510  WBC 16.8*  HGB 18.0*  PLT 228  CREATININE 1.14   Estimated Creatinine Clearance: 57.7 mL/min (by C-G formula based on Cr of 1.14). No results for input(s): VANCOTROUGH, VANCOPEAK, VANCORANDOM, GENTTROUGH, GENTPEAK, GENTRANDOM, TOBRATROUGH, TOBRAPEAK, TOBRARND, AMIKACINPEAK, AMIKACINTROU, AMIKACIN in the last 72 hours.   Microbiology: No results found for this or any previous visit (from the past 720 hour(s)).  Medical History: Past Medical History  Diagnosis Date  . Hx of transient ischemic attack (TIA)   . Hyperlipidemia   . ED (erectile dysfunction)   . Personal history of kidney stones   . Subarachnoid hemorrhage ([place])     '11- hospitalized -3 to 4 days(passed out-awoke in hospital)- no residual affects.  . Myocardial infarction ([place])     12/1991  . Dysphagia   . Hypokalemia   . Hypothyroidism   . Sleep apnea     on CPAP-settings 6  . History of kidney stones 02-27-13    x5- none recent  . Throat cancer [facility])     '00-radiation therapy  . Stroke ([place])   . Unspecified cerebral artery occlusion with cerebral infarction 11/08/2012  . Episodic confusion 02-27-13    hx. of, not currently(multiple episodes- ? TIA's)-always saw MD to be evaluated.  Marland Kitchen AKI (acute kidney injury) ([place]) 02/23/2011    pt. denies any problems 02-27-13  . Hypertension     hx. labile - with spikes every 3-6 mos.-last 8'14 ED visit.  Marland Kitchen Hypertensive encephalopathy   . Headache(784.0)     episodic with periods of  confusion -multiple  . Neuromuscular disorder ([place])     "parkinson's" -no tremor-sees neurology MD at [facility]  . Coronary artery disease     Dr. Martinique    Assessment: 64 yom with suspected sepsis. Pharmacy consulted to dose vancomycin/ceftriaxone/acyclovir for r/o meningitis + ampicillin per MD. Given 1x doses of Zosyn/Vanc 1g in ED. Tmax/24h 100.2, wbc 16.8 on admit. SCr 1.14, CrCl~57. No LP scheduled at this point.  12/22 vanc>> 12/22 zosyn x 1 dose 12/22 CTX>> 12/22 acyclovir>>  12/22 BCx2>> 12/22 UC>>  Goal of Therapy:  Vancomycin trough level 15-20 mcg/ml  Plan:  Vanc 1g x 1 in ED; Additional Vanc 500mg  to complete load (1500mg  total); then Vanc 750mg  IV q12h CTX 2g IV q12h Acyclovir 700mg  IV q8h (~10mg /kg) Ampicillin 2g IV q4h Monitor clinical progress, c/s, renal function, abx plan/LOT VT@SS  as indicated Order HSV lab to r/o HSV encephalitis?  Elicia Lamp, PharmD, BCPS Clinical Pharmacist Pager [phone] 03/11/2015 8:35 PM

## 2015-03-11 NOTE — H&P (Signed)
[facility] History and Physical  ABBEY FAHNER [id number] DOB: [birth year] DOA: 03/11/2015  Referring physician: DR. Jearld Pies PCP: Dory Horn, MD   Chief Complaint: confision  HPI: Benjamin Park is a 74 y.o. male past medical history of hypothyroidism and multiple admissions for acute encephalopathy attributed to hypertensive encephalopathy. Most of the history is obtained from the chart and family as patient cannot provide history. The wife relates he is a highly functional man was feeling well until 2 days ago she started becoming altered which progressed to today. His wife found him on the floor on his own urine. She relates that he's has denied chest pain shortness of breath fever nausea vomiting diarrhea constipation hematemesis and dysuria.  In the ED: He was found to have a blood pressure 156/26 he was given empiric vancomycin and Zosyn and a single dose of labetalol and his blood pressure improved to 121/81, an ABG was done that shows 7.42/34/77, with a mild lactic acidosis of 2.6, CBC was done that was 16 with a mild left shift, CT of the head is below as well as chest x-ray did not show any infiltrates UA does not show show any signs of infection.  Review of Systems:  Constitutional:  No weight loss, night sweats, Fevers, chills, fatigue.  HEENT:  No headaches, Difficulty swallowing,Tooth/dental problems,Sore throat,  No sneezing, itching, ear ache, nasal congestion, post nasal drip,  Cardio-vascular:  No chest pain, Orthopnea, PND, swelling in lower extremities, anasarca, dizziness, palpitations  GI:  No heartburn, indigestion, abdominal pain, nausea, vomiting, diarrhea, change in bowel habits, loss of appetite  Resp:  No shortness of breath with exertion or at rest. No excess mucus, no productive cough, No non-productive cough, No coughing up of blood.No change in color of mucus.No wheezing.No chest wall deformity  Skin:  no rash or lesions.  GU:  no  dysuria, change in color of urine, no urgency or frequency. No flank pain.  Musculoskeletal:  No joint pain or swelling. No decreased range of motion. No back pain.  Psych:  No change in mood or affect. No depression or anxiety. No memory loss.   Past Medical History  Diagnosis Date  . Hx of transient ischemic attack (TIA)   . Hyperlipidemia   . ED (erectile dysfunction)   . Personal history of kidney stones   . Subarachnoid hemorrhage ([place])     '11- hospitalized -3 to 4 days(passed out-awoke in hospital)- no residual affects.  . Myocardial infarction ([place])     12/1991  . Dysphagia   . Hypokalemia   . Hypothyroidism   . Sleep apnea     on CPAP-settings 6  . History of kidney stones 02-27-13    x5- none recent  . Throat cancer [facility])     '00-radiation therapy  . Stroke ([place])   . Unspecified cerebral artery occlusion with cerebral infarction 11/08/2012  . Episodic confusion 02-27-13    hx. of, not currently(multiple episodes- ? TIA's)-always saw MD to be evaluated.  Marland Kitchen AKI (acute kidney injury) ([place]) 02/23/2011    pt. denies any problems 02-27-13  . Hypertension     hx. labile - with spikes every 3-6 mos.-last 8'14 ED visit.  Marland Kitchen Hypertensive encephalopathy   . Headache(784.0)     episodic with periods of confusion -multiple  . Neuromuscular disorder ([place])     "parkinson's" -no tremor-sees neurology MD at [facility]  . Coronary artery disease     Dr. [place]   Past Surgical History  Procedure  Laterality Date  . Angioplasty  1993    LAD  . Cardiac catheterization    . Coronary angioplasty    . Basal cell cancer      head  . Hernia repair    . Appendectomy    . Patent foramen ovale closure  2008  . Eye surgery    . Colonoscopy N/A 06/18/2012    Procedure: COLONOSCOPY;  Surgeon: Jeryl Columbia, MD;  Location: [facility] ENDOSCOPY;  Service: Endoscopy;  Laterality: N/A;  h&p in file-[place]   . Hot hemostasis N/A 06/18/2012    Procedure: HOT HEMOSTASIS (ARGON PLASMA COAGULATION/BICAP);   Surgeon: Jeryl Columbia, MD;  Location: [facility] ENDOSCOPY;  Service: Endoscopy;  Laterality: N/A;  . Colonoscopy with propofol N/A 03/07/2013    Procedure: COLONOSCOPY WITH PROPOFOL;  Surgeon: Jeryl Columbia, MD;  Location: [facility] ENDOSCOPY;  Service: Endoscopy;  Laterality: N/A;   Social History:  reports that he quit smoking about 24 years ago. His smoking use included Cigarettes. He has never used smokeless tobacco. He reports that he does not drink alcohol or use illicit drugs.  No Known Allergies  Family History  Problem Relation Age of Onset  . Heart failure Father   . Heart attack Brother   . Anemia Brother 61    mi    Prior to Admission medications   Medication Sig Start Date End Date Taking? Authorizing Provider  ALPRAZolam Duanne Moron) 0.25 MG tablet Take 0.25 mg by mouth daily as needed for anxiety (combative).  11/09/14  Yes Historical Provider, MD  Ascorbic Acid (VITAMIN C PO) Take 1 tablet by mouth daily.   Yes Historical Provider, MD  aspirin 325 MG tablet Take 325 mg by mouth daily.     Yes Historical Provider, MD  chlorthalidone (HYGROTON) 25 MG tablet Take 25 mg by mouth daily. 05/28/14  Yes Historical Provider, MD  cloNIDine (CATAPRES) 0.1 MG tablet Take 0.1 mg by mouth as needed (high blood pressure).  11/29/13  Yes Historical Provider, MD  ENSURE (ENSURE) Take 237 mLs by mouth daily.   Yes Historical Provider, MD  fish oil-omega-3 fatty acids 1000 MG capsule Take 1 g by mouth daily.    Yes Historical Provider, MD  GLUCOSAMINE HCL PO Take 1 tablet by mouth daily.   Yes Historical Provider, MD  KLOR-CON M10 10 MEQ tablet Take 10 mEq by mouth daily. 05/12/14  Yes Historical Provider, MD  labetalol (NORMODYNE) 200 MG tablet Take 200 mg by mouth 2 (two) times daily. 01/20/15  Yes Historical Provider, MD  levothyroxine (SYNTHROID, LEVOTHROID) 112 MCG tablet Take 112 mcg by mouth daily.  12/11/13  Yes Historical Provider, MD  Multiple Vitamin (MULITIVITAMIN WITH MINERALS) TABS Take 1 tablet by  mouth daily.     Yes Historical Provider, MD  Nutritional Supplements (CARNATION BREAKFAST ESSENTIALS PO) Take 1 Bottle by mouth daily.   Yes Historical Provider, MD  cloNIDine (CATAPRES) 0.1 MG tablet Take 0.5 tablets (0.05 mg total) by mouth 2 (two) times daily. Patient not taking: Reported on 03/11/2015 08/17/14   Cherene Altes, MD  doxycycline (VIBRA-TABS) 100 MG tablet Take 1 tablet (100 mg total) by mouth every 12 (twelve) hours. Patient not taking: Reported on 03/11/2015 08/17/14   Cherene Altes, MD  labetalol (NORMODYNE) 100 MG tablet Take 1 tablet (100 mg total) by mouth 2 (two) times daily. Patient not taking: Reported on 03/11/2015 02/03/14   Melton Alar, PA-C  Maltodextrin-Xanthan Gum (RESOURCE THICKENUP CLEAR) POWD Take 1 g by mouth  as needed. Patient not taking: Reported on 03/11/2015 02/03/14   Melton Alar, PA-C  testosterone cypionate (DEPOTESTOTERONE CYPIONATE) 200 MG/ML injection Inject 1 mL (200 mg total) into the muscle every 28 (twenty-eight) days. Patient not taking: Reported on 03/11/2015 11/20/12   Blanchie Serve, MD   Physical Exam: Filed Vitals:   03/11/15 1715 03/11/15 1730 03/11/15 1738 03/11/15 1800  BP: 112/84 122/93 112/84 121/89  Pulse:   92   Temp:      TempSrc:      Resp: 23 20 24 22   SpO2:   96% 99%    Wt Readings from Last 3 Encounters:  08/15/14 72.8 kg (160 lb 7.9 oz)  02/06/14 73.71 kg (162 lb 8 oz)  02/01/14 71.8 kg (158 lb 4.6 oz)    General:  Appears calm and comfortable.not able to follow commands  Eyes: PERRL. ENT: lips & tongue Neck: no LAD, masses or thyromegaly Cardiovascular: RRR, no m/r/g. No LE edema. Telemetry: SR, no arrhythmias  Respiratory: CTA bilaterally, no w/r/r. Normal respiratory effort. Abdomen: soft, ntnd Skin: no rash or induration seen on limited exam Musculoskeletal: trace lower extremity edema  Psychiatric: grossly normal mood and affect, speech fluent and appropriate Neurologic: grossly non-focal.            Labs on Admission:  Basic Metabolic Panel:  Recent Labs Lab 03/11/15 1510  NA 136  K 3.9  CL 98*  CO2 25  GLUCOSE 175*  BUN 23*  CREATININE 1.14  CALCIUM 9.6   Liver Function Tests:  Recent Labs Lab 03/11/15 1510  AST 43*  ALT 23  ALKPHOS 90  BILITOT 1.1  PROT 8.0  ALBUMIN 4.3   No results for input(s): LIPASE, AMYLASE in the last 168 hours. No results for input(s): AMMONIA in the last 168 hours. CBC:  Recent Labs Lab 03/11/15 1510  WBC 16.8*  NEUTROABS 15.1*  HGB 18.0*  HCT 50.3  MCV 95.6  PLT 228   Cardiac Enzymes: No results for input(s): CKTOTAL, CKMB, CKMBINDEX, TROPONINI in the last 168 hours.  BNP (last 3 results) No results for input(s): BNP in the last 8760 hours.  ProBNP (last 3 results) No results for input(s): PROBNP in the last 8760 hours.  CBG: No results for input(s): GLUCAP in the last 168 hours.  Radiological Exams on Admission: Ct Head Wo Contrast  03/11/2015  CLINICAL DATA:  Altered mental status.  Incontinence and vomiting. EXAM: CT HEAD WITHOUT CONTRAST TECHNIQUE: Contiguous axial images were obtained from the base of the skull through the vertex without contrast. COMPARISON:  08/14/2014 FINDINGS: There is extensive cerebral and cerebellar atrophy which is unchanged. Again noted is low density throughout the white matter which is similar to the previous examination. No evidence for acute hemorrhage, mass lesion, midline shift, hydrocephalus or new large infarct. Mucosal disease in the maxillary sinuses, left side greater than right. Some mucosal disease in the anterior ethmoid air cells. Negative for a calvarial fracture. IMPRESSION: No acute intracranial abnormality. Stable cerebral and cerebellar atrophy. Diffuse white matter changes likely related to chronic small vessel ischemic disease. Electronically Signed   By: Markus Daft M.D.   On: 03/11/2015 16:01   Dg Chest Portable 1 View  03/11/2015  CLINICAL DATA:  Tachycardia  with hypertension. Altered mental status. EXAM: PORTABLE CHEST 1 VIEW COMPARISON:  08/14/2014 FINDINGS: Patient is rotated towards the left on this examination. Evidence for foramen ovale closure device in the heart. Lungs are clear without airspace disease or pulmonary edema. Negative  for a pneumothorax. No acute bone abnormality. Difficult to evaluate heart size due to patient rotation. IMPRESSION: No acute chest findings. Electronically Signed   By: Markus Daft M.D.   On: 03/11/2015 15:13    EKG: Independently reviewed.   Assessment/Plan Active Problems:   Encephalopathy acute   Hypertensive encephalopathy   Malnutrition of moderate degree (HCC)  He's done this in the past several times as reviewing his records. I agree with continuing IV vancomycin and Zosyn, will put him on labetalol IV when necessary. Place him nothing by mouth except for medication and resume his home regimen. Start IV fluids at D5 half-normal saline, follow strict I's and O's. Recheck lactate in 2 hours. I agree with empiric antibiotics due to his elevated lactic acid, elevated white count with left shift. But will have a low threshold to DC antibiotics if cultures negative.  Code Status: Full DVT Prophylaxis:heparin Family Communication: wife Disposition Plan: inpatient  Time spent: 93 min  FELIZ Marguarite Arbour [facility] Pager [phone]

## 2015-03-12 ENCOUNTER — Inpatient Hospital Stay (HOSPITAL_COMMUNITY): Payer: Medicare HMO

## 2015-03-12 ENCOUNTER — Ambulatory Visit (HOSPITAL_COMMUNITY): Payer: Medicare HMO

## 2015-03-12 DIAGNOSIS — J9690 Respiratory failure, unspecified, unspecified whether with hypoxia or hypercapnia: Secondary | ICD-10-CM | POA: Insufficient documentation

## 2015-03-12 DIAGNOSIS — E872 Acidosis, unspecified: Secondary | ICD-10-CM | POA: Insufficient documentation

## 2015-03-12 DIAGNOSIS — R509 Fever, unspecified: Secondary | ICD-10-CM

## 2015-03-12 DIAGNOSIS — R4182 Altered mental status, unspecified: Secondary | ICD-10-CM

## 2015-03-12 DIAGNOSIS — G9340 Encephalopathy, unspecified: Secondary | ICD-10-CM

## 2015-03-12 DIAGNOSIS — J9601 Acute respiratory failure with hypoxia: Secondary | ICD-10-CM

## 2015-03-12 LAB — BASIC METABOLIC PANEL
ANION GAP: 9 (ref 5–15)
Anion gap: 10 (ref 5–15)
BUN: 16 mg/dL (ref 6–20)
BUN: 19 mg/dL (ref 6–20)
CALCIUM: 7.6 mg/dL — AB (ref 8.9–10.3)
CALCIUM: 7.4 mg/dL — AB (ref 8.9–10.3)
CO2: 25 mmol/L (ref 22–32)
CO2: 22 mmol/L (ref 22–32)
CREATININE: 1.21 mg/dL (ref 0.61–1.24)
Chloride: 101 mmol/L (ref 101–111)
Chloride: 106 mmol/L (ref 101–111)
Creatinine, Ser: 0.9 mg/dL (ref 0.61–1.24)
GFR calc non Af Amer: 58 mL/min — ABNORMAL LOW (ref >60)
GLUCOSE: 128 mg/dL — AB (ref 65–99)
Glucose, Bld: 151 mg/dL — ABNORMAL HIGH (ref 65–99)
POTASSIUM: 2.6 mmol/L — AB (ref 3.5–5.1)
Potassium: 3.7 mmol/L (ref 3.5–5.1)
SODIUM: 135 mmol/L (ref 135–145)
Sodium: 138 mmol/L (ref 135–145)

## 2015-03-12 LAB — URINALYSIS, ROUTINE W REFLEX MICROSCOPIC
BILIRUBIN URINE: NEGATIVE
Glucose, UA: NEGATIVE mg/dL
Ketones, ur: NEGATIVE mg/dL
NITRITE: NEGATIVE
Protein, ur: NEGATIVE mg/dL
SPECIFIC GRAVITY, URINE: 1.005 (ref 1.005–1.030)
pH: 6 (ref 5.0–8.0)

## 2015-03-12 LAB — CBC WITH DIFFERENTIAL/PLATELET
BASOS ABS: 0 10*3/uL (ref 0.0–0.1)
BASOS PCT: 0 %
Eosinophils Absolute: 0 10*3/uL (ref 0.0–0.7)
Eosinophils Relative: 0 %
HEMATOCRIT: 43.4 % (ref 39.0–52.0)
HEMOGLOBIN: 15.1 g/dL (ref 13.0–17.0)
LYMPHS PCT: 3 %
Lymphs Abs: 0.6 10*3/uL — ABNORMAL LOW (ref 0.7–4.0)
MCH: 33.2 pg (ref 26.0–34.0)
MCHC: 34.8 g/dL (ref 30.0–36.0)
MCV: 95.4 fL (ref 78.0–100.0)
Monocytes Absolute: 1.2 10*3/uL — ABNORMAL HIGH (ref 0.1–1.0)
Monocytes Relative: 7 %
NEUTROS ABS: 15.2 10*3/uL — AB (ref 1.7–7.7)
NEUTROS PCT: 90 %
Platelets: 190 10*3/uL (ref 150–400)
RBC: 4.55 MIL/uL (ref 4.22–5.81)
RDW: 13 % (ref 11.5–15.5)
WBC: 17 10*3/uL — AB (ref 4.0–10.5)

## 2015-03-12 LAB — RAPID URINE DRUG SCREEN, HOSP PERFORMED
Amphetamines: NOT DETECTED
BENZODIAZEPINES: NOT DETECTED
Barbiturates: NOT DETECTED
COCAINE: NOT DETECTED
OPIATES: NOT DETECTED
Tetrahydrocannabinol: NOT DETECTED

## 2015-03-12 LAB — CBC
HCT: 40 % (ref 39.0–52.0)
Hemoglobin: 13.7 g/dL (ref 13.0–17.0)
MCH: 33.7 pg (ref 26.0–34.0)
MCHC: 34.3 g/dL (ref 30.0–36.0)
MCV: 98.3 fL (ref 78.0–100.0)
PLATELETS: 163 10*3/uL (ref 150–400)
RBC: 4.07 MIL/uL — AB (ref 4.22–5.81)
RDW: 13.3 % (ref 11.5–15.5)
WBC: 19.6 10*3/uL — AB (ref 4.0–10.5)

## 2015-03-12 LAB — TROPONIN I
TROPONIN I: 0.14 ng/mL — AB (ref <0.031)
TROPONIN I: 0.07 ng/mL — AB (ref <0.031)

## 2015-03-12 LAB — MRSA PCR SCREENING: MRSA BY PCR: NEGATIVE

## 2015-03-12 LAB — GLUCOSE, CAPILLARY
GLUCOSE-CAPILLARY: 116 mg/dL — AB (ref 65–99)
GLUCOSE-CAPILLARY: 128 mg/dL — AB (ref 65–99)
GLUCOSE-CAPILLARY: 150 mg/dL — AB (ref 65–99)
GLUCOSE-CAPILLARY: 157 mg/dL — AB (ref 65–99)
Glucose-Capillary: 127 mg/dL — ABNORMAL HIGH (ref 65–99)
Glucose-Capillary: 130 mg/dL — ABNORMAL HIGH (ref 65–99)
Glucose-Capillary: 108 mg/dL — ABNORMAL HIGH (ref 65–99)
Glucose-Capillary: 112 mg/dL — ABNORMAL HIGH (ref 65–99)

## 2015-03-12 LAB — LACTIC ACID, PLASMA
Lactic Acid, Venous: 1.6 mmol/L (ref 0.5–2.0)
Lactic Acid, Venous: 1.1 mmol/L (ref 0.5–2.0)
Lactic Acid, Venous: 0.8 mmol/L (ref 0.5–2.0)

## 2015-03-12 LAB — POCT I-STAT 3, ART BLOOD GAS (G3+)
ACID-BASE EXCESS: 1 mmol/L (ref 0.0–2.0)
Bicarbonate: 24.2 mEq/L — ABNORMAL HIGH (ref 20.0–24.0)
O2 SAT: 100 %
PH ART: 7.487 — AB (ref 7.350–7.450)
PO2 ART: 199 mmHg — AB (ref 80.0–100.0)
Patient temperature: 97.6
TCO2: 25 mmol/L (ref 0–100)
pCO2 arterial: 31.9 mmHg — ABNORMAL LOW (ref 35.0–45.0)

## 2015-03-12 LAB — URINE MICROSCOPIC-ADD ON

## 2015-03-12 LAB — OSMOLALITY: OSMOLALITY: 286 mosm/kg (ref 275–295)

## 2015-03-12 LAB — VITAMIN B12: VITAMIN B 12: 627 pg/mL (ref 180–914)

## 2015-03-12 LAB — T4, FREE: Free T4: 1.01 ng/dL (ref 0.61–1.12)

## 2015-03-12 LAB — TSH: TSH: 2.63 u[IU]/mL (ref 0.350–4.500)

## 2015-03-12 LAB — OSMOLALITY, URINE: OSMOLALITY UR: 515 mosm/kg (ref 300–900)

## 2015-03-12 MED ORDER — FENTANYL CITRATE (PF) 100 MCG/2ML IJ SOLN
50.0000 ug | Freq: Once | INTRAMUSCULAR | Status: AC
  Administered 2015-03-12: 50 ug via INTRAVENOUS

## 2015-03-12 MED ORDER — HYDRALAZINE HCL 20 MG/ML IJ SOLN
10.0000 mg | INTRAMUSCULAR | Status: DC | PRN
  Administered 2015-03-12: 20 mg via INTRAVENOUS
  Filled 2015-03-12: qty 1

## 2015-03-12 MED ORDER — LACTATED RINGERS IV BOLUS (SEPSIS)
1000.0000 mL | Freq: Once | INTRAVENOUS | Status: AC
  Administered 2015-03-12: 1000 mL via INTRAVENOUS

## 2015-03-12 MED ORDER — INSULIN ASPART 100 UNIT/ML ~~LOC~~ SOLN
2.0000 [IU] | SUBCUTANEOUS | Status: DC
  Administered 2015-03-12 – 2015-03-13 (×5): 2 [IU] via SUBCUTANEOUS
  Administered 2015-03-13: 6 [IU] via SUBCUTANEOUS
  Administered 2015-03-14: 2 [IU] via SUBCUTANEOUS

## 2015-03-12 MED ORDER — POTASSIUM CHLORIDE CRYS ER 10 MEQ PO TBCR: 10.0000 meq | EXTENDED_RELEASE_TABLET | Freq: Every day | ORAL | Status: DC

## 2015-03-12 MED ORDER — ALPRAZOLAM 0.25 MG PO TABS: 0.2500 mg | ORAL_TABLET | Freq: Every day | ORAL | Status: DC | PRN

## 2015-03-12 MED ORDER — FENTANYL CITRATE (PF) 100 MCG/2ML IJ SOLN
25.0000 ug | INTRAMUSCULAR | Status: DC | PRN
  Administered 2015-03-12 – 2015-03-13 (×4): 50 ug via INTRAVENOUS
  Administered 2015-03-13: 25 ug via INTRAVENOUS
  Administered 2015-03-13: 50 ug via INTRAVENOUS
  Filled 2015-03-12 (×6): qty 2

## 2015-03-12 MED ORDER — MIDAZOLAM HCL 2 MG/2ML IJ SOLN
INTRAMUSCULAR | Status: AC
  Filled 2015-03-12: qty 4

## 2015-03-12 MED ORDER — ASPIRIN 325 MG PO TABS
325.0000 mg | ORAL_TABLET | Freq: Every day | ORAL | Status: DC
  Administered 2015-03-13: 325 mg via ORAL
  Filled 2015-03-12: qty 1

## 2015-03-12 MED ORDER — DOPAMINE-DEXTROSE 3.2-5 MG/ML-% IV SOLN
0.0000 ug/kg/min | INTRAVENOUS | Status: DC
  Administered 2015-03-12: 5 ug/kg/min via INTRAVENOUS

## 2015-03-12 MED ORDER — MIDAZOLAM HCL 2 MG/2ML IJ SOLN
4.0000 mg | Freq: Once | INTRAMUSCULAR | Status: AC
  Administered 2015-03-12: 4 mg via INTRAVENOUS

## 2015-03-12 MED ORDER — DOXYCYCLINE HYCLATE 100 MG PO TABS: 100.0000 mg | ORAL_TABLET | Freq: Two times a day (BID) | ORAL | Status: DC

## 2015-03-12 MED ORDER — DEXTROSE-NACL 5-0.45 % IV SOLN
INTRAVENOUS | Status: DC
  Administered 2015-03-12 (×2): via INTRAVENOUS

## 2015-03-12 MED ORDER — LEVOTHYROXINE SODIUM 112 MCG PO TABS: 112.0000 ug | ORAL_TABLET | Freq: Every day | ORAL | Status: DC

## 2015-03-12 MED ORDER — VITAL AF 1.2 CAL PO LIQD
1000.0000 mL | ORAL | Status: DC
  Administered 2015-03-13: 1000 mL
  Filled 2015-03-12 (×7): qty 1000

## 2015-03-12 MED ORDER — ONDANSETRON HCL 4 MG PO TABS: 4.0000 mg | ORAL_TABLET | Freq: Four times a day (QID) | ORAL | Status: DC | PRN

## 2015-03-12 MED ORDER — LIDOCAINE VISCOUS 2 % MT SOLN
OROMUCOSAL | Status: AC
  Administered 2015-03-12: 14:00:00
  Filled 2015-03-12: qty 15

## 2015-03-12 MED ORDER — CHLORHEXIDINE GLUCONATE 0.12% ORAL RINSE (MEDLINE KIT): 15.0000 mL | Freq: Two times a day (BID) | OROMUCOSAL | Status: DC

## 2015-03-12 MED ORDER — MORPHINE SULFATE (PF) 2 MG/ML IV SOLN
2.0000 mg | INTRAVENOUS | Status: DC | PRN
  Administered 2015-03-12: 2 mg via INTRAVENOUS
  Filled 2015-03-12: qty 1

## 2015-03-12 MED ORDER — NOREPINEPHRINE BITARTRATE 1 MG/ML IV SOLN
2.0000 ug/min | INTRAVENOUS | Status: DC
  Administered 2015-03-12: 10 ug/min via INTRAVENOUS
  Administered 2015-03-13: 8 ug/min via INTRAVENOUS
  Filled 2015-03-12 (×2): qty 4

## 2015-03-12 MED ORDER — SODIUM CHLORIDE 0.9 % IJ SOLN
3.0000 mL | Freq: Two times a day (BID) | INTRAMUSCULAR | Status: DC
  Administered 2015-03-12 – 2015-03-16 (×7): 3 mL via INTRAVENOUS

## 2015-03-12 MED ORDER — DOPAMINE-DEXTROSE 3.2-5 MG/ML-% IV SOLN
INTRAVENOUS | Status: AC
  Filled 2015-03-12: qty 250

## 2015-03-12 MED ORDER — FENTANYL CITRATE (PF) 100 MCG/2ML IJ SOLN
INTRAMUSCULAR | Status: AC
  Filled 2015-03-12: qty 2

## 2015-03-12 MED ORDER — SODIUM CHLORIDE 0.9 % IV BOLUS (SEPSIS)
1000.0000 mL | Freq: Once | INTRAVENOUS | Status: AC
  Administered 2015-03-12: 1000 mL via INTRAVENOUS

## 2015-03-12 MED ORDER — CHLORHEXIDINE GLUCONATE 0.12% ORAL RINSE (MEDLINE KIT)
15.0000 mL | Freq: Two times a day (BID) | OROMUCOSAL | Status: DC
  Administered 2015-03-12 – 2015-03-13 (×3): 15 mL via OROMUCOSAL

## 2015-03-12 MED ORDER — ANTISEPTIC ORAL RINSE SOLUTION (CORINZ)
7.0000 mL | OROMUCOSAL | Status: DC
  Administered 2015-03-12 – 2015-03-13 (×11): 7 mL via OROMUCOSAL

## 2015-03-12 MED ORDER — CETYLPYRIDINIUM CHLORIDE 0.05 % MT LIQD
7.0000 mL | Freq: Two times a day (BID) | OROMUCOSAL | Status: DC
  Administered 2015-03-12: 7 mL via OROMUCOSAL

## 2015-03-12 MED ORDER — LABETALOL HCL 5 MG/ML IV SOLN
10.0000 mg | Freq: Four times a day (QID) | INTRAVENOUS | Status: DC | PRN
  Administered 2015-03-13 – 2015-03-14 (×2): 10 mg via INTRAVENOUS
  Filled 2015-03-12 (×2): qty 4

## 2015-03-12 MED ORDER — LABETALOL HCL 200 MG PO TABS: 200.0000 mg | ORAL_TABLET | Freq: Two times a day (BID) | ORAL | Status: DC

## 2015-03-12 MED ORDER — DEXTROSE-NACL 5-0.45 % IV SOLN: INTRAVENOUS | Status: DC

## 2015-03-12 MED ORDER — LEVOTHYROXINE SODIUM 100 MCG IV SOLR
50.0000 ug | Freq: Every day | INTRAVENOUS | Status: DC
  Administered 2015-03-12: 50 ug via INTRAVENOUS
  Filled 2015-03-12: qty 5

## 2015-03-12 MED ORDER — MIDAZOLAM HCL 2 MG/2ML IJ SOLN
4.0000 mg | Freq: Once | INTRAMUSCULAR | Status: AC
  Administered 2015-03-13: 4 mg via INTRAVENOUS
  Filled 2015-03-12: qty 4

## 2015-03-12 MED ORDER — MIDAZOLAM HCL 2 MG/2ML IJ SOLN
INTRAMUSCULAR | Status: AC
  Filled 2015-03-12: qty 2

## 2015-03-12 MED ORDER — POTASSIUM CHLORIDE 10 MEQ/100ML IV SOLN
10.0000 meq | INTRAVENOUS | Status: AC
  Administered 2015-03-12 (×6): 10 meq via INTRAVENOUS
  Filled 2015-03-12 (×6): qty 100

## 2015-03-12 MED ORDER — HYDRALAZINE HCL 20 MG/ML IJ SOLN: 10.0000 mg | INTRAMUSCULAR | Status: DC | PRN

## 2015-03-12 MED ORDER — CLONIDINE HCL 0.1 MG PO TABS: 0.1000 mg | ORAL_TABLET | Freq: Four times a day (QID) | ORAL | Status: DC | PRN

## 2015-03-12 MED ORDER — HEPARIN SODIUM (PORCINE) 5000 UNIT/ML IJ SOLN
5000.0000 [IU] | Freq: Three times a day (TID) | INTRAMUSCULAR | Status: DC
  Administered 2015-03-12 – 2015-03-18 (×17): 5000 [IU] via SUBCUTANEOUS
  Filled 2015-03-12 (×18): qty 1

## 2015-03-12 MED ORDER — PANTOPRAZOLE SODIUM 40 MG IV SOLR
40.0000 mg | Freq: Every day | INTRAVENOUS | Status: DC
  Administered 2015-03-12: 40 mg via INTRAVENOUS
  Filled 2015-03-12: qty 40

## 2015-03-12 MED ORDER — CHLORTHALIDONE 25 MG PO TABS
25.0000 mg | ORAL_TABLET | Freq: Every day | ORAL | Status: DC
  Filled 2015-03-12: qty 1

## 2015-03-12 MED ORDER — ETOMIDATE 2 MG/ML IV SOLN
20.0000 mg | Freq: Once | INTRAVENOUS | Status: AC
  Administered 2015-03-12: 20 mg via INTRAVENOUS

## 2015-03-12 MED ORDER — ONDANSETRON HCL 4 MG/2ML IJ SOLN: 4.0000 mg | Freq: Four times a day (QID) | INTRAMUSCULAR | Status: DC | PRN

## 2015-03-12 MED ORDER — CLONIDINE HCL 0.1 MG PO TABS: 0.0500 mg | ORAL_TABLET | Freq: Two times a day (BID) | ORAL | Status: DC

## 2015-03-12 MED ORDER — TESTOSTERONE CYPIONATE 200 MG/ML IM SOLN: 200.0000 mg | INTRAMUSCULAR | Status: DC

## 2015-03-12 MED ORDER — ANTISEPTIC ORAL RINSE SOLUTION (CORINZ): 7.0000 mL | OROMUCOSAL | Status: DC

## 2015-03-12 MED ORDER — ROCURONIUM BROMIDE 50 MG/5ML IV SOLN
40.0000 mg | Freq: Once | INTRAVENOUS | Status: AC
  Administered 2015-03-12: 40 mg via INTRAVENOUS

## 2015-03-12 MED ORDER — POLYETHYLENE GLYCOL 3350 17 G PO PACK: 17.0000 g | PACK | Freq: Every day | ORAL | Status: DC | PRN

## 2015-03-12 MED ORDER — ANTISEPTIC ORAL RINSE SOLUTION (CORINZ)
7.0000 mL | Freq: Four times a day (QID) | OROMUCOSAL | Status: DC
  Administered 2015-03-12: 7 mL via OROMUCOSAL

## 2015-03-12 NOTE — Progress Notes (Signed)
Utilization review completed. Avannah Decker, RN, BSN. 

## 2015-03-12 NOTE — Progress Notes (Signed)
Notified Dr Titus Mould of patient low b/p and heart rate of 49. Orders given. A-line started confirmed low b/p see mar for medication started

## 2015-03-12 NOTE — Procedures (Signed)
ELECTROENCEPHALOGRAM REPORT  Date of Study: 03/12/2015  Patient's Name: Benjamin Park MRN: [id number] Date of Birth: 1940-09-15  Referring Provider: Dr. Georgann Housekeeper  Clinical History: This is a 74 year old man with worsening mental status.   Medications: acyclovir (ZOVIRAX) 700 mg in dextrose 5 % 100 mL IVPB ALPRAZolam (XANAX) tablet 0.25 mg ampicillin (OMNIPEN) 2 g in sodium chloride 0.9 % 50 mL IVPB aspirin tablet 325 mg cefTRIAXone (ROCEPHIN) 2 g in dextrose 5 % 50 mL IVPB cloNIDine (CATAPRES) tablet 0.1 mg levothyroxine (SYNTHROID, LEVOTHROID) injection 50 mcg pantoprazole (PROTONIX) injection 40 mg vancomycin (VANCOCIN) IVPB 750 mg/150 ml premix  Technical Summary: A multichannel digital EEG recording measured by the international 10-20 system with electrodes applied with paste and impedances below 5000 ohms performed as portable with EKG monitoring in an intubated and unresponsive patient.  Hyperventilation and photic stimulation were not performed.  The digital EEG was referentially recorded, reformatted, and digitally filtered in a variety of bipolar and referential montages for optimal display.   Description: The patient is intubated and unresponsive during the recording. No sedating medications listed. There is no clear posterior dominant rhythm. The background consists of a large amount of diffuse 4-5 Hz theta and 2-3 Hz delta slowing. Normal sleep architecture is not seen. With noxious stimulation, there is slight increase in faster frequencies and muscle artifact. Hyperventilation and photic stimulation were not performed.  There were no epileptiform discharges or electrographic seizures seen.    EKG lead was unremarkable.  Impression: This EEG is abnormal due to moderate diffuse slowing of the background.  Clinical Correlation of the above findings indicates diffuse cerebral dysfunction that is non-specific in etiology and can be seen with hypoxic/ischemic injury,  toxic/metabolic encephalopathies, or medication effect.  The absence of epileptiform discharges does not rule out a clinical diagnosis of epilepsy.  Clinical correlation is advised.   Ellouise Newer, M.D.

## 2015-03-12 NOTE — Consult Note (Signed)
PULMONARY / CRITICAL CARE MEDICINE   Name: Benjamin Park MRN: [id number] DOB: 1940-12-01    ADMISSION DATE:  03/11/2015 CONSULTATION DATE:  03/11/2015  REFERRING MD:  Aileen Fass TRH  CHIEF COMPLAINT:  AMS  HISTORY OF PRESENT ILLNESS:   74 year old male past medical history as below, which includes uncontrolled hypertension with history of hypertensive encephalopathy admissions, TIA, subarachnoid hemorrhage, MI, hypothyroidism, obstructive sleep apnea, and acute kidney injury. Is also recorded in prior cardiology office notes that he has a history of chronic aspiration which as been well-documented on swallowing evaluations. It is noted that he refuses to use a thickening solution. Most recent admission is just about a year ago for hypertensive encephalopathy. At baseline the patient is very functional and requires no assistance to perform his ADLs.  12/22 he presented to [facility] emergency department complaining of a 2 day history of altered mental status. On the day of admission she found him on the floor and his own urine. She describes no chest pain, shortness breath, fever, GI symptoms in the preceding days. In the emergency department he was profoundly hypertensive with a systolic blood pressure [id number]. The patient was reportedly unresponsive time of arrival to medical floor at around 8 PM. There was increasing concern for sepsis of uncertain etiology. Resuscitation fluids were administered and he was started on IV antibiotics including meningitis coverage. ABG showed hypoxia. Lactic acid was initially elevated but since improved, however, has not entirely cleared to this point. PCCM was consulted for further evaluation.  PAST MEDICAL HISTORY :  He  has a past medical history of transient ischemic attack (TIA); Hyperlipidemia; ED (erectile dysfunction); Personal history of kidney stones; Subarachnoid hemorrhage ([place]); Myocardial infarction ([place]); Dysphagia; Hypokalemia; Hypothyroidism; Sleep  apnea; History of kidney stones (02-27-13); Throat cancer ([place]); Stroke [facility]); Unspecified cerebral artery occlusion with cerebral infarction (11/08/2012); Episodic confusion (02-27-13); AKI (acute kidney injury) ([place]) (02/23/2011); Hypertension; Hypertensive encephalopathy; Headache(784.0); Neuromuscular disorder ([place]); and Coronary artery disease.  PAST SURGICAL HISTORY: He  has past surgical history that includes Angioplasty (1993); Cardiac catheterization; Coronary angioplasty; basal cell cancer; Hernia repair; Appendectomy; Patent foramen ovale closure (2008); Eye surgery; Colonoscopy (N/A, 06/18/2012); Hot hemostasis (N/A, 06/18/2012); and Colonoscopy with propofol (N/A, 03/07/2013).  No Known Allergies  No current facility-administered medications on file prior to encounter.   Current Outpatient Prescriptions on File Prior to Encounter  Medication Sig  . aspirin 325 MG tablet Take 325 mg by mouth daily.    . chlorthalidone (HYGROTON) 25 MG tablet Take 25 mg by mouth daily.  . cloNIDine (CATAPRES) 0.1 MG tablet Take 0.1 mg by mouth as needed (high blood pressure).   . fish oil-omega-3 fatty acids 1000 MG capsule Take 1 g by mouth daily.   Marland Kitchen GLUCOSAMINE HCL PO Take 1 tablet by mouth daily.  Marland Kitchen KLOR-CON M10 10 MEQ tablet Take 10 mEq by mouth daily.  Marland Kitchen levothyroxine (SYNTHROID, LEVOTHROID) 112 MCG tablet Take 112 mcg by mouth daily.   . Multiple Vitamin (MULITIVITAMIN WITH MINERALS) TABS Take 1 tablet by mouth daily.    . cloNIDine (CATAPRES) 0.1 MG tablet Take 0.5 tablets (0.05 mg total) by mouth 2 (two) times daily. (Patient not taking: Reported on 03/11/2015)  . doxycycline (VIBRA-TABS) 100 MG tablet Take 1 tablet (100 mg total) by mouth every 12 (twelve) hours. (Patient not taking: Reported on 03/11/2015)  . labetalol (NORMODYNE) 100 MG tablet Take 1 tablet (100 mg total) by mouth 2 (two) times daily. (Patient not taking: Reported on 03/11/2015)  .  Maltodextrin-Xanthan Gum (RESOURCE THICKENUP  CLEAR) POWD Take 1 g by mouth as needed. (Patient not taking: Reported on 03/11/2015)  . testosterone cypionate (DEPOTESTOTERONE CYPIONATE) 200 MG/ML injection Inject 1 mL (200 mg total) into the muscle every 28 (twenty-eight) days. (Patient not taking: Reported on 03/11/2015)    FAMILY HISTORY:  His indicated that his mother is deceased. He indicated that his father is deceased. He indicated that his sister is deceased. He indicated that his brother is deceased.   SOCIAL HISTORY: He  reports that he quit smoking about 24 years ago. His smoking use included Cigarettes. He has never used smokeless tobacco. He reports that he does not drink alcohol or use illicit drugs.  REVIEW OF SYSTEMS:   Unable due to acute encephalopathy  SUBJECTIVE:    VITAL SIGNS: BP 121/85 mmHg  Pulse 107  Temp(Src) 99.5 F (37.5 C) (Oral)  Resp 19  Ht 6' (1.829 m)  Wt 70.7 kg (155 lb 13.8 oz)  BMI 21.13 kg/m2  SpO2 100%  HEMODYNAMICS:    VENTILATOR SETTINGS: Vent Mode:  [-] PRVC FiO2 (%):  [50 %-100 %] 50 % Set Rate:  [18 bmp] 18 bmp Vt Set:  [580 mL] 580 mL PEEP:  [5 cmH20] 5 cmH20 Plateau Pressure:  [17 cmH20-18 cmH20] 18 cmH20  INTAKE / OUTPUT: I/O last 3 completed shifts: In: 1893 [I.V.:42.5; Other:72.5; IV [id number] Out: 860 [Urine:860]  PHYSICAL EXAMINATION: General:  Elderly chronically ill appearing male Neuro:  RASS -2, minimally responsive to painful stimuli. GCS 4 HEENT:  [place]/AT, PERRL, no appreciable JVD. Dried brown secretions around mouth. No obvious nuchal rigidity.  Cardiovascular:  Tachy, regular, no MRG Lungs: Poor inspiratory effort, diminished.  Abdomen:  Soft, non-tender, non-distended Musculoskeletal:  No acute deformity or ROM limitation. Skin:  Grossly intact, diaphoretic  LABS:  BMET  Recent Labs Lab 03/11/15 1510 03/12/15 0243 03/12/15 0715  NA 136 135 138  K 3.9 2.6* 3.7  CL 98* 101 106  CO2 25 25 22   BUN 23* 16 19  CREATININE 1.14 0.90 1.21   GLUCOSE 175* 151* 128*    Electrolytes  Recent Labs Lab 03/11/15 1510 03/12/15 0243 03/12/15 0715  CALCIUM 9.6 7.6* 7.4*    CBC  Recent Labs Lab 03/11/15 2144 03/12/15 0243 03/12/15 0715  WBC 13.5* 17.0* 19.6*  HGB 16.4 15.1 13.7  HCT 48.0 43.4 40.0  PLT 169 190 163    Coag's  Recent Labs Lab 03/11/15 1510 03/11/15 2144  APTT  --  35  INR 1.07  --     Sepsis Markers  Recent Labs Lab 03/11/15 1838 03/11/15 2144 03/12/15 0125  LATICACIDVEN 5.35* 2.3* 1.6  PROCALCITON  --  <0.10  --     ABG  Recent Labs Lab 03/11/15 1645 03/11/15 2115 03/12/15 0404  PHART 7.421 7.443 7.487*  PCO2ART 34.0* 36.0 31.9*  PO2ART 77.0* 78.9* 199.0*    Liver Enzymes  Recent Labs Lab 03/11/15 1510  AST 43*  ALT 23  ALKPHOS 90  BILITOT 1.1  ALBUMIN 4.3    Cardiac Enzymes  Recent Labs Lab 03/12/15 0243  TROPONINI 0.14*    Glucose  Recent Labs Lab 03/12/15 0011 03/12/15 0504 03/12/15 0747  GLUCAP 157* 127* 130*    Imaging Ct Head Wo Contrast  03/11/2015  CLINICAL DATA:  Altered mental status.  Incontinence and vomiting. EXAM: CT HEAD WITHOUT CONTRAST TECHNIQUE: Contiguous axial images were obtained from the base of the skull through the vertex without contrast. COMPARISON:  08/14/2014 FINDINGS: There  is extensive cerebral and cerebellar atrophy which is unchanged. Again noted is low density throughout the white matter which is similar to the previous examination. No evidence for acute hemorrhage, mass lesion, midline shift, hydrocephalus or new large infarct. Mucosal disease in the maxillary sinuses, left side greater than right. Some mucosal disease in the anterior ethmoid air cells. Negative for a calvarial fracture. IMPRESSION: No acute intracranial abnormality. Stable cerebral and cerebellar atrophy. Diffuse white matter changes likely related to chronic small vessel ischemic disease. Electronically Signed   By: Markus Daft M.D.   On: 03/11/2015 16:01    Portable Chest Xray  03/12/2015  CLINICAL DATA:  Respiratory failure EXAM: PORTABLE CHEST 1 VIEW COMPARISON:  Portable exam 0517 hours compared to 03/11/2015 FINDINGS: Tip of endotracheal tube at [place]ina directed into RIGHT mainstem bronchus, recommend withdrawal 2.5 cm. Normal heart size, mediastinal contours, and pulmonary vascularity. ASD occlusion device noted. Atelectasis versus infiltrate in LEFT lower lobe. Minimal RIGHT base atelectasis. Upper lungs clear. Skin fold projects over LEFT upper lobe. No definite pleural effusion or pneumothorax. IMPRESSION: Tip of endotracheal tube at carina directed into RIGHT mainstem bronchus, recommend withdrawal 2.5 cm. Atelectasis versus consolidation LEFT lower lobe with minimal RIGHT basilar atelectasis. Findings called to [facility] on 03/12/2015 at 0805 hours. Electronically Signed   By: Lavonia Dana M.D.   On: 03/12/2015 08:05   Dg Chest Portable 1 View  03/11/2015  CLINICAL DATA:  Tachycardia with hypertension. Altered mental status. EXAM: PORTABLE CHEST 1 VIEW COMPARISON:  08/14/2014 FINDINGS: Patient is rotated towards the left on this examination. Evidence for foramen ovale closure device in the heart. Lungs are clear without airspace disease or pulmonary edema. Negative for a pneumothorax. No acute bone abnormality. Difficult to evaluate heart size due to patient rotation. IMPRESSION: No acute chest findings. Electronically Signed   By: Markus Daft M.D.   On: 03/11/2015 15:13    STUDIES:  CT head 12/22 > No acute intracranial abnormality. Stable cerebral and cerebellar atrophy. Diffuse white matter changes likely related to chronic small vessel ischemic disease.  CULTURES: Blood 12/22 > Urine 12/22 > Sputum 12/22 >  ANTIBIOTICS: Ampicillin 12/22 > Ceftriaxone 12/22 > Pip/tazo 12/22 > Vancomycin 12/22 >  SIGNIFICANT EVENTS: 12/23 > admit for acute encephalopathy  LINES/TUBES: PIV  DISCUSSION: 74 year old male with past medical  history including labile hypertension with episodes of hypertensive encephalopathy and past, and Parkinson's disease. He is admitted 12/22 with altered mental status 2 days. Etiology uncertain at time of admission. Mental status continued to decline to the point with patient became unresponsive. He was thought to be septic without clear source was covered for meningitis and admitted to the stepdown unit under the hospitalist team. PCCM was consulted due to worsening mental status. Patient is obtunded at the time of evaluation. He is not protecting his airway. Suspect that this is secondary to hypertensive encephalopathy base of the patient's history. Will obtain EEG and MRI. There is concern of aspiration based on his history as well. Suspicion for meningitis is low, however, in absence of obvious source we will continue broad antibiotic treatment for this. We'll transfer patient to ICU, and discuss plans moving forward with his wife re: Intubation other aggressive measures. If she is in favor of these patient will most likely be intubated on arrival to ICU.  ASSESSMENT / PLAN:  PULMONARY A: At risk for intubation due to poor airway protection Chronic aspiration OSA uncertain if prescribed CPAP but is generally non-compliant Unable  to protect airway and was intubated. P:   Full vent support. F/U ABG. Titrate O2 for sats. Adjust vent for ABG.  CARDIOVASCULAR A:  Hypertensive crisis H/o CAD, MI, labile HTN, PFO closure  P:  Telemetry monitoring. SBP goal </ 159mmHg. Echo pending. Continue IVF until able to get TF (no OGT for now). PRN hydralazine Hold home PO antihypertensives  (clonidine, labetalol)  RENAL A:   Acute renal failure  P:   Continue gentle IVF with D5 1/2NS Repeat BMP now post resuscitation fluid Strict I&O  GASTROINTESTINAL A:   No acute issues  P:   NPO IV protonix OGT via IR TF per nutrition if able to get OGT  HEMATOLOGIC A:   No acute issues  P:   Follow hematocrit and hemoglobin Heparin for VTE ppx  INFECTIOUS A:   SIRS (fever, leukocytosis w L shift, tachycardia) ? Sepsis, no obvious source. Concern aspiration. Doubt meningitis, however in absence of other source will continue to treat  P:   ABX as above (zosyn, vanc, amp, rocephin) Initial procalcitonin nml, continue to trend Trend WBC and fever curve  ENDOCRINE A:   Hypothyroidism Hyperglycemia without history of DM  P:   CBG monitoring and SSI. TSH, free T4 noted. Synthroid to IV 1/2 dose.  NEUROLOGIC A:   Acute encephalopathy. Unclear etiology. Parkinson's disease  P:   RASS goal: 0 UDS negative MRI brain without contrast EEG in AM  FAMILY  - Updates: Wife updated vai phone by [facility] 12/23. Full code.  - Inter-disciplinary family meet or Palliative Care meeting due by:  03/18/2015  The patient is critically ill with multiple organ systems failure and requires high complexity decision making for assessment and support, frequent evaluation and titration of therapies, application of advanced monitoring technologies and extensive interpretation of multiple databases.   Critical Care Time devoted to patient care services described in this note is  45  Minutes. This time reflects time of care of this signee Dr Jennet Maduro. This critical care time does not reflect procedure time, or teaching time or supervisory time of PA/NP/Med student/Med Resident etc but could involve care discussion time.  Rush Farmer, M.D. [facility] Pulmonary/Critical Care Medicine. Pager: [phone]. After hours pager: [phone].  03/12/2015 9:54 AM

## 2015-03-12 NOTE — Progress Notes (Signed)
Initial Nutrition Assessment  INTERVENTION:   Start Vital AF 1.2 @ 25 ml/hr and increase by 10 ml every 4 hours to goal rate of 65 ml/hr  Provides: 1872 kcal (99% of needs), 117 grams protein, and 1265 ml H2O.   NUTRITION DIAGNOSIS:   Inadequate oral intake related to inability to eat as evidenced by NPO status.  GOAL:   Patient will meet greater than or equal to 90% of their needs  MONITOR:   Vent status, Labs, Weight trends, TF tolerance, I & O's  REASON FOR ASSESSMENT:   Consult Enteral/tube feeding initiation and management  ASSESSMENT:   74 year old male past medical history as below, which includes uncontrolled hypertension with history of hypertensive encephalopathy admissions, TIA, subarachnoid hemorrhage, MI, hypothyroidism, obstructive sleep apnea, and acute kidney injury. Is also recorded in prior cardiology office notes that he has a history of chronic aspiration which as been well-documented on swallowing evaluations. It is noted that he refuses to use a thickening solution. Most recent admission is just about a year ago for hypertensive encephalopathy. At baseline the patient is very functional and requires no assistance to perform his ADLs.  Patient is currently intubated on ventilator support MV: 10.6 L/min Temp (24hrs), Avg:99.6 F (37.6 C), Min:97.6 F (36.4 C), Max:100.4 F (38 C)  Labs and meds reviewed.  NG tube placed by IR after unable to get OG tube in during intubation.  Wife at bedside and reports that pt has not lost any weight recently. He eats oatmeal with lots of add-ins (peanut butter, honey, bran, etc) for breakfast. He drinks one Gatorade, ensure, and carnation instant breakfast per day. He has known dysphagia and should be thickening his liquids to honey consistency but per wife adds some thickener but not enough. She works at night and she leaves his dinner in the fridge so that he can warm it up but he has periods of confusion and does not  always remember to eat.  Nutrition-Focused physical exam completed. Findings are no fat depletion, mild muscle depletion, and no edema.    Diet Order:  Diet NPO time specified  Skin:  Reviewed, no issues  Last BM:  unknown  Height:   Ht Readings from Last 1 Encounters:  03/11/15 6' (1.829 m)   Weight:   Wt Readings from Last 1 Encounters:  03/11/15 155 lb 13.8 oz (70.7 kg)   Ideal Body Weight:  80.9 kg  BMI:  Body mass index is 21.13 kg/(m^2).  Estimated Nutritional Needs:   Kcal:  1898  Protein:  90-110 grams  Fluid:  > 1.9 L/day  EDUCATION NEEDS:   No education needs identified at this time  [place], [place], [place] Pager [phone] After Hours Pager

## 2015-03-12 NOTE — Consult Note (Signed)
PULMONARY / CRITICAL CARE MEDICINE   Name: Benjamin Park MRN: [id number] DOB: [birth year]    ADMISSION DATE:  03/11/2015 CONSULTATION DATE:  03/11/2015  REFERRING MD:  Aileen Fass TRH  CHIEF COMPLAINT:  AMS  HISTORY OF PRESENT ILLNESS:   74 year old male past medical history as below, which includes uncontrolled hypertension with history of hypertensive encephalopathy admissions, TIA, subarachnoid hemorrhage, MI, hypothyroidism, obstructive sleep apnea, and acute kidney injury. Is also recorded in prior cardiology office notes that he has a history of chronic aspiration which as been well-documented on swallowing evaluations. It is noted that he refuses to use a thickening solution. Most recent admission is just about a year ago for hypertensive encephalopathy. At baseline the patient is very functional and requires no assistance to perform his ADLs.  12/22 he presented to [facility] emergency department complaining of a 2 day history of altered mental status. On the day of admission she found him on the floor and his own urine. She describes no chest pain, shortness breath, fever, GI symptoms in the preceding days. In the emergency department he was profoundly hypertensive with a systolic blood pressure [id number]. The patient was reportedly unresponsive time of arrival to medical floor at around 8 PM. There was increasing concern for sepsis of uncertain etiology. Resuscitation fluids were administered and he was started on IV antibiotics including meningitis coverage. ABG showed hypoxia. Lactic acid was initially elevated but since improved, however, has not entirely cleared to this point. PCCM was consulted for further evaluation.  PAST MEDICAL HISTORY :  He  has a past medical history of transient ischemic attack (TIA); Hyperlipidemia; ED (erectile dysfunction); Personal history of kidney stones; Subarachnoid hemorrhage ([place]); Myocardial infarction ([place]); Dysphagia; Hypokalemia; Hypothyroidism; Sleep  apnea; History of kidney stones (02-27-13); Throat cancer ([place]); Stroke [facility]); Unspecified cerebral artery occlusion with cerebral infarction (11/08/2012); Episodic confusion (02-27-13); AKI (acute kidney injury) ([place]) (02/23/2011); Hypertension; Hypertensive encephalopathy; Headache(784.0); Neuromuscular disorder ([place]); and Coronary artery disease.  PAST SURGICAL HISTORY: He  has past surgical history that includes Angioplasty (1993); Cardiac catheterization; Coronary angioplasty; basal cell cancer; Hernia repair; Appendectomy; Patent foramen ovale closure (2008); Eye surgery; Colonoscopy (N/A, 06/18/2012); Hot hemostasis (N/A, 06/18/2012); and Colonoscopy with propofol (N/A, 03/07/2013).  No Known Allergies  No current facility-administered medications on file prior to encounter.   Current Outpatient Prescriptions on File Prior to Encounter  Medication Sig  . aspirin 325 MG tablet Take 325 mg by mouth daily.    . chlorthalidone (HYGROTON) 25 MG tablet Take 25 mg by mouth daily.  . cloNIDine (CATAPRES) 0.1 MG tablet Take 0.1 mg by mouth as needed (high blood pressure).   . fish oil-omega-3 fatty acids 1000 MG capsule Take 1 g by mouth daily.   Marland Kitchen GLUCOSAMINE HCL PO Take 1 tablet by mouth daily.  Marland Kitchen KLOR-CON M10 10 MEQ tablet Take 10 mEq by mouth daily.  Marland Kitchen levothyroxine (SYNTHROID, LEVOTHROID) 112 MCG tablet Take 112 mcg by mouth daily.   . Multiple Vitamin (MULITIVITAMIN WITH MINERALS) TABS Take 1 tablet by mouth daily.    . cloNIDine (CATAPRES) 0.1 MG tablet Take 0.5 tablets (0.05 mg total) by mouth 2 (two) times daily. (Patient not taking: Reported on 03/11/2015)  . doxycycline (VIBRA-TABS) 100 MG tablet Take 1 tablet (100 mg total) by mouth every 12 (twelve) hours. (Patient not taking: Reported on 03/11/2015)  . labetalol (NORMODYNE) 100 MG tablet Take 1 tablet (100 mg total) by mouth 2 (two) times daily. (Patient not taking: Reported on 03/11/2015)  .  Maltodextrin-Xanthan Gum (RESOURCE THICKENUP  CLEAR) POWD Take 1 g by mouth as needed. (Patient not taking: Reported on 03/11/2015)  . testosterone cypionate (DEPOTESTOTERONE CYPIONATE) 200 MG/ML injection Inject 1 mL (200 mg total) into the muscle every 28 (twenty-eight) days. (Patient not taking: Reported on 03/11/2015)    FAMILY HISTORY:  His indicated that his mother is deceased. He indicated that his father is deceased. He indicated that his sister is deceased. He indicated that his brother is deceased.   SOCIAL HISTORY: He  reports that he quit smoking about 24 years ago. His smoking use included Cigarettes. He has never used smokeless tobacco. He reports that he does not drink alcohol or use illicit drugs.  REVIEW OF SYSTEMS:   Unable due to acute encephalopathy  SUBJECTIVE:    VITAL SIGNS: BP 140/91 mmHg  Pulse 98  Temp(Src) 97.6 F (36.4 C) (Axillary)  Resp 20  Ht 6' (1.829 m)  Wt 70.7 kg (155 lb 13.8 oz)  BMI 21.13 kg/m2  SpO2 100%  HEMODYNAMICS:    VENTILATOR SETTINGS:    INTAKE / OUTPUT:    PHYSICAL EXAMINATION: General:  Elderly chronically ill appearing male Neuro:  RASS -2, minimally responsive to painful stimuli. GCS 4 HEENT:  [place]/AT, PERRL, no appreciable JVD. Dried brown secretions around mouth. No obvious nuchal rigidity.  Cardiovascular:  Tachy, regular, no MRG Lungs: Poor inspiratory effort, diminished.  Abdomen:  Soft, non-tender, non-distended Musculoskeletal:  No acute deformity or ROM limitation. Skin:  Grossly intact, diaphoretic  LABS:  BMET  Recent Labs Lab 03/11/15 1510  NA 136  K 3.9  CL 98*  CO2 25  BUN 23*  CREATININE 1.14  GLUCOSE 175*    Electrolytes  Recent Labs Lab 03/11/15 1510  CALCIUM 9.6    CBC  Recent Labs Lab 03/11/15 1510 03/11/15 2144  WBC 16.8* 13.5*  HGB 18.0* 16.4  HCT 50.3 48.0  PLT 228 169    Coag's  Recent Labs Lab 03/11/15 1510 03/11/15 2144  APTT  --  35  INR 1.07  --     Sepsis Markers  Recent Labs Lab 03/11/15 1522  03/11/15 1838 03/11/15 2144  LATICACIDVEN 2.61* 5.35* 2.3*  PROCALCITON  --   --  <0.10    ABG  Recent Labs Lab 03/11/15 1645 03/11/15 2115  PHART 7.421 7.443  PCO2ART 34.0* 36.0  PO2ART 77.0* 78.9*    Liver Enzymes  Recent Labs Lab 03/11/15 1510  AST 43*  ALT 23  ALKPHOS 90  BILITOT 1.1  ALBUMIN 4.3    Cardiac Enzymes No results for input(s): TROPONINI, PROBNP in the last 168 hours.  Glucose  Recent Labs Lab 03/12/15 0011  GLUCAP 157*    Imaging Ct Head Wo Contrast  03/11/2015  CLINICAL DATA:  Altered mental status.  Incontinence and vomiting. EXAM: CT HEAD WITHOUT CONTRAST TECHNIQUE: Contiguous axial images were obtained from the base of the skull through the vertex without contrast. COMPARISON:  08/14/2014 FINDINGS: There is extensive cerebral and cerebellar atrophy which is unchanged. Again noted is low density throughout the white matter which is similar to the previous examination. No evidence for acute hemorrhage, mass lesion, midline shift, hydrocephalus or new large infarct. Mucosal disease in the maxillary sinuses, left side greater than right. Some mucosal disease in the anterior ethmoid air cells. Negative for a calvarial fracture. IMPRESSION: No acute intracranial abnormality. Stable cerebral and cerebellar atrophy. Diffuse white matter changes likely related to chronic small vessel ischemic disease. Electronically Signed   By: Quita Skye  Anselm Pancoast M.D.   On: 03/11/2015 16:01   Dg Chest Portable 1 View  03/11/2015  CLINICAL DATA:  Tachycardia with hypertension. Altered mental status. EXAM: PORTABLE CHEST 1 VIEW COMPARISON:  08/14/2014 FINDINGS: Patient is rotated towards the left on this examination. Evidence for foramen ovale closure device in the heart. Lungs are clear without airspace disease or pulmonary edema. Negative for a pneumothorax. No acute bone abnormality. Difficult to evaluate heart size due to patient rotation. IMPRESSION: No acute chest findings.  Electronically Signed   By: Markus Daft M.D.   On: 03/11/2015 15:13    STUDIES:  CT head 12/22 > No acute intracranial abnormality. Stable cerebral and cerebellar atrophy. Diffuse white matter changes likely related to chronic small vessel ischemic disease.  CULTURES: Blood 12/22 > Urine 12/22 > Sputum 12/22 >  ANTIBIOTICS: Ampicillin 12/22 > Ceftriaxone 12/22 > Pip/tazo 12/22 > Vancomycin 12/22 >  SIGNIFICANT EVENTS: 12/23 > admit for acute encephalopathy  LINES/TUBES:   DISCUSSION: 74 year old male with past medical history including labile hypertension with episodes of hypertensive encephalopathy and past, and Parkinson's disease. He is admitted 12/22 with altered mental status 2 days. Etiology uncertain at time of admission. Mental status continued to decline to the point with patient became unresponsive. He was thought to be septic without clear source was covered for meningitis and admitted to the stepdown unit under the hospitalist team. PCCM was consulted due to worsening mental status. Patient is obtunded at the time of evaluation. He is not protecting his airway. Suspect that this is secondary to hypertensive encephalopathy base of the patient's history. Will obtain EEG and MRI. There is concern of aspiration based on his history as well. Suspicion for meningitis is low, however, in absence of obvious source we will continue broad antibiotic treatment for this. We'll transfer patient to ICU, and discuss plans moving forward with his wife re: Intubation other aggressive measures. If she is in favor of these patient will most likely be intubated on arrival to ICU.  ASSESSMENT / PLAN:  PULMONARY A: At risk for intubation due to poor airway protection Chronic aspiration OSA uncertain if prescribed CPAP but is generally non-compliant  P:   Supplemental as indicated to keep SpO2 > 92% Pulmonary hygiene Risk for intubation  CARDIOVASCULAR A:  Hypertensive crisis H/o CAD,  MI, labile HTN, PFO closure  P:  Telemetry monitoring SBP goal </ 15mmHg Echo Gentle IVF now (has received 4 L) PRN hydralazine Hold home PO antihypertensives  (clonidine, labetalol) Check troponin   RENAL A:   Acute renal failure  P:   Continue gentle IVF with D5 1/2NS Repeat BMP now post resuscitation fluid Strict I&O  GASTROINTESTINAL A:   No acute issues  P:   NPO IV protonix  HEMATOLOGIC A:   No acute issues  P:  Follow hematocrit and hemoglobin Heparin for VTE ppx Check B12 and methylmalonic acid  INFECTIOUS A:   SIRS (fever, leukocytosis w L shift, tachycardia) ? Sepsis, no obvious source. Concern aspiration. Doubt meningitis, however in absence of other source will continue to treat  P:   ABX as above (zosyn, vanc, amp, rocephin) Initial procalcitonin nml, continue to trend Trend WBC and fever curve  ENDOCRINE A:   Hypothyroidism Hyperglycemia without history of DM  P:   CBG monitoring and SSI Check TSH, free T4 Synthroid to IV 1/2 dose  NEUROLOGIC A:   Acute encephalopathy. Unclear etiology. Parkinson's disease  P:   RASS goal: 0 UDS Serum, urine Osmolarity MRI brain  without contrast EEG in AM   FAMILY  - Updates: Wife updated vai phone by [facility] 12/23. Full code.  - Inter-disciplinary family meet or Palliative Care meeting due by:  03/18/2015   Georgann Housekeeper, AGACNP-BC [place] Pulmonology/Critical Care Pager [phone] or [phone]  03/12/2015 1:36 AM

## 2015-03-12 NOTE — Progress Notes (Signed)
Unable to place an OG or NG tube.  Four attempts by different RN's were made to place an OG tube and two RN's attempted to place NG tube with no success.  OG and NG tube appear to be coiling in the patient's throat.   Vicie Mutters, RN

## 2015-03-12 NOTE — Procedures (Signed)
Arterial Catheter Insertion Procedure Note Benjamin Park [id number] 01/12/1941  Procedure: Insertion of Arterial Catheter  Indications: Blood pressure monitoring  Procedure Details Consent: Risks of procedure as well as the alternatives and risks of each were explained to the (patient/caregiver).  Consent for procedure obtained. Time Out: Verified patient identification, verified procedure, site/side was marked, verified correct patient position, special equipment/implants available, medications/allergies/relevent history reviewed, required imaging and test results available.  Performed  Maximum sterile technique was used including antiseptics, cap, gloves, gown, hand hygiene, mask and sheet. Skin prep: Chlorhexidine; local anesthetic administered 20 gauge catheter was inserted into right radial artery using the Seldinger technique.  Evaluation Blood flow good; BP tracing good. Complications: No apparent complications.   Mcneil Sober 03/12/2015

## 2015-03-12 NOTE — Progress Notes (Signed)
EEG Completed; Results Pending  

## 2015-03-12 NOTE — Progress Notes (Signed)
[place] Progress Note Patient Name: Benjamin Park DOB: Apr 06, 1940 MRN: [id number]   Date of Service  03/12/2015  HPI/Events of Note   bedside nurse reports CVP 6.   eICU Interventions   lactated Ringer's IV fluid bolus 1 L over one hour. Plan to repeat CVP and notify M.D. for CVP less than 10 .     Intervention Category Major Interventions: Hypotension - evaluation and management  Tera Partridge 03/12/2015, 11:14 PM

## 2015-03-12 NOTE — Procedures (Signed)
Central Venous Catheter Insertion Procedure Note Benjamin Park [id number] 1940-08-17  Procedure: Insertion of Central Venous Catheter Indications: Drug and/or fluid administration  Procedure Details Consent: Risks of procedure as well as the alternatives and risks of each were explained to the (patient/caregiver).  Consent for procedure obtained. Time Out: Verified patient identification, verified procedure, site/side was marked, verified correct patient position, special equipment/implants available, medications/allergies/relevent history reviewed, required imaging and test results available.  Performed  Maximum sterile technique was used including antiseptics, cap, gloves, gown, hand hygiene, mask and sheet. Skin prep: Chlorhexidine; local anesthetic administered A antimicrobial bonded/coated triple lumen catheter was placed in the right internal jugular vein using the Seldinger technique.  Evaluation Blood flow good Complications: No apparent complications Patient did tolerate procedure well. Chest X-ray ordered to verify placement.  CXR: normal.  Benjamin Park 03/12/2015, 8:09 PM

## 2015-03-12 NOTE — Progress Notes (Signed)
[place] Progress Note Patient Name: Benjamin Park DOB: 1940-09-14 MRN: [id number]   Date of Service  03/12/2015  HPI/Events of Note  Concerns in hypotension accuracy? If he is in fact hypotensive owuld need line likely and pressors for support  eICU Interventions       Intervention Category Major Interventions: Hypotension - evaluation and management  Raylene Miyamoto. 03/12/2015, 4:38 PM

## 2015-03-12 NOTE — Progress Notes (Signed)
  Echocardiogram 2D Echocardiogram has been performed.  Darlina Sicilian M 03/12/2015, 12:36 PM

## 2015-03-12 NOTE — Consult Note (Signed)
Consult Reason for Consult:altered mental status Referring Physician: Dr Ashok Cordia CCM  CC: altered mental status  HPI: Benjamin Park is an 74 y.o. male hx of TIA/stroke, HLD, SAH, Parkinson's admitted for 2 days of increasing confusion. At baseline family reports he is high functioning and able to perform all ADLs. 2 days ago they note he became increasingly confused. On day of admission family found him unconscious in his own urine. Admission labs pertinent for WBC 16.8, BUN 23, lactic acid 2.61. Was intubated for airway protection.   Family notes multiple episodes in the past where his blood pressure is elevated and he becomes confused. EMS reports BP of 170/100 upon their arrival. Family notes that his mental status appears to be improving compared to this morning.   CT head imaging reviewed, no acute process noted.   Past Medical History  Diagnosis Date  . Hx of transient ischemic attack (TIA)   . Hyperlipidemia   . ED (erectile dysfunction)   . Personal history of kidney stones   . Subarachnoid hemorrhage ([place])     '11- hospitalized -3 to 4 days(passed out-awoke in hospital)- no residual affects.  . Myocardial infarction ([place])     12/1991  . Dysphagia   . Hypokalemia   . Hypothyroidism   . Sleep apnea     on CPAP-settings 6  . History of kidney stones 02-27-13    x5- none recent  . Throat cancer [facility])     '00-radiation therapy  . Stroke ([place])   . Unspecified cerebral artery occlusion with cerebral infarction 11/08/2012  . Episodic confusion 02-27-13    hx. of, not currently(multiple episodes- ? TIA's)-always saw MD to be evaluated.  Marland Kitchen AKI (acute kidney injury) ([place]) 02/23/2011    pt. denies any problems 02-27-13  . Hypertension     hx. labile - with spikes every 3-6 mos.-last 8'14 ED visit.  Marland Kitchen Hypertensive encephalopathy   . Headache(784.0)     episodic with periods of confusion -multiple  . Neuromuscular disorder ([place])     "parkinson's" -no tremor-sees neurology MD at  [facility]  . Coronary artery disease     Dr. Martinique    Past Surgical History  Procedure Laterality Date  . Angioplasty  1993    LAD  . Cardiac catheterization    . Coronary angioplasty    . Basal cell cancer      head  . Hernia repair    . Appendectomy    . Patent foramen ovale closure  2008  . Eye surgery    . Colonoscopy N/A 06/18/2012    Procedure: COLONOSCOPY;  Surgeon: Jeryl Columbia, MD;  Location: [facility] ENDOSCOPY;  Service: Endoscopy;  Laterality: N/A;  h&p in file-[facility]   . Hot hemostasis N/A 06/18/2012    Procedure: HOT HEMOSTASIS (ARGON PLASMA COAGULATION/BICAP);  Surgeon: Jeryl Columbia, MD;  Location: [facility] ENDOSCOPY;  Service: Endoscopy;  Laterality: N/A;  . Colonoscopy with propofol N/A 03/07/2013    Procedure: COLONOSCOPY WITH PROPOFOL;  Surgeon: Jeryl Columbia, MD;  Location: [facility] ENDOSCOPY;  Service: Endoscopy;  Laterality: N/A;    Family History  Problem Relation Age of Onset  . Heart failure Father   . Heart attack Brother   . Anemia Brother 46    mi    Social History:  reports that he quit smoking about 24 years ago. His smoking use included Cigarettes. He has never used smokeless tobacco. He reports that he does not drink alcohol or use illicit drugs.  No  Known Allergies  Medications: I have reviewed the patient's current medications.   ROS: Out of a complete 14 system review, the patient complains of only the following symptoms, and all other reviewed systems are negative. +confusion  Physical Examination: Filed Vitals:   03/12/15 1858 03/12/15 1859  BP:    Pulse: 75 74  Temp:    Resp: 18 18   Physical Exam  Constitutional: He appears well-developed and well-nourished.  Psych: Affect appropriate to situation Eyes: No scleral injection HENT: No OP obstrucion Head: Normocephalic.  Cardiovascular: Normal rate and regular rhythm.  Respiratory: Effort normal and breath sounds normal.  GI: Soft. Bowel sounds are normal. No distension. There is no tenderness.   Skin: WDI  Neurologic Examination Mental Status: Intubated, will open eyes spontaneously and to voice/noxious stimuli. Will intermittently follow simple commands though not consistent. Non-verbal.  Cranial Nerves: II: optic discs not visualized, blinks to threat bilaterall, pupils equal, round, reactive to light  III,IV, VI: ptosis not present, extra-ocular motions intact bilaterally, no gaze deviation V,VII: face symmetric,  VIII: hearing normal bilaterally IX,X: gag reflex present XI: unable to test XII: unable to test Motor: Unable to formally test, withdrawals to noxious stimuli in all etremities Sensory: see above \\Deep  Tendon Reflexes: 2+ and symmetric throughout Plantars: Right: downgoing   Left: downgoing Cerebellar: Unable to test Gait: unable to test  Laboratory Studies:   Basic Metabolic Panel:  Recent Labs Lab 03/11/15 1510 03/12/15 0243 03/12/15 0715  NA 136 135 138  K 3.9 2.6* 3.7  CL 98* 101 106  CO2 25 25 22   GLUCOSE 175* 151* 128*  BUN 23* 16 19  CREATININE 1.14 0.90 1.21  CALCIUM 9.6 7.6* 7.4*    Liver Function Tests:  Recent Labs Lab 03/11/15 1510  AST 43*  ALT 23  ALKPHOS 90  BILITOT 1.1  PROT 8.0  ALBUMIN 4.3   No results for input(s): LIPASE, AMYLASE in the last 168 hours. No results for input(s): AMMONIA in the last 168 hours.  CBC:  Recent Labs Lab 03/11/15 1510 03/11/15 2144 03/12/15 0243 03/12/15 0715  WBC 16.8* 13.5* 17.0* 19.6*  NEUTROABS 15.1* 12.1* 15.2*  --   HGB 18.0* 16.4 15.1 13.7  HCT 50.3 48.0 43.4 40.0  MCV 95.6 96.0 95.4 98.3  PLT 228 169 190 163    Cardiac Enzymes:  Recent Labs Lab 03/12/15 0243  TROPONINI 0.14*    BNP: Invalid input(s): POCBNP  CBG:  Recent Labs Lab 03/12/15 0011 03/12/15 0504 03/12/15 0747 03/12/15 1131 03/12/15 1655  GLUCAP 157* 127* 130* 108* 112*    Microbiology: Results for orders placed or performed during the hospital encounter of 03/11/15  Blood culture  (routine x 2)     Status: None (Preliminary result)   Collection Time: 03/11/15  3:00 PM  Result Value Ref Range Status   Specimen Description BLOOD RIGHT ANTECUBITAL  Final   Special Requests BOTTLES DRAWN AEROBIC AND ANAEROBIC 10CC  Final   Culture NO GROWTH < 24 HOURS  Final   Report Status PENDING  Incomplete  Blood culture (routine x 2)     Status: None (Preliminary result)   Collection Time: 03/11/15  3:05 PM  Result Value Ref Range Status   Specimen Description BLOOD RIGHT ARM  Final   Special Requests IN PEDIATRIC BOTTLE 4CC  Final   Culture NO GROWTH < 24 HOURS  Final   Report Status PENDING  Incomplete  Urine culture     Status: None (Preliminary result)  Collection Time: 03/11/15  3:32 PM  Result Value Ref Range Status   Specimen Description URINE, CATHETERIZED  Final   Special Requests NONE  Final   Culture NO GROWTH < 24 HOURS  Final   Report Status PENDING  Incomplete  MRSA PCR Screening     Status: None   Collection Time: 03/11/15  9:47 PM  Result Value Ref Range Status   MRSA by PCR NEGATIVE NEGATIVE Final    Comment:        The GeneXpert MRSA Assay (FDA approved for NASAL specimens only), is one component of a comprehensive MRSA colonization surveillance program. It is not intended to diagnose MRSA infection nor to guide or monitor treatment for MRSA infections.     Coagulation Studies:  Recent Labs  03/11/15 1510  LABPROT 14.1  INR 1.07    Urinalysis:  Recent Labs Lab 03/11/15 1532  COLORURINE YELLOW  LABSPEC 1.020  PHURINE 7.0  GLUCOSEU 250*  HGBUR MODERATE*  BILIRUBINUR NEGATIVE  KETONESUR >80*  PROTEINUR >300*  NITRITE NEGATIVE  LEUKOCYTESUR NEGATIVE    Lipid Panel:     Component Value Date/Time   CHOL 179 02/01/2014 0413   TRIG 90 02/01/2014 0413   HDL 44 02/01/2014 0413   CHOLHDL 4.1 02/01/2014 0413   VLDL 18 02/01/2014 0413   LDLCALC 117* 02/01/2014 0413    HgbA1C:  Lab Results  Component Value Date   HGBA1C 5.4  02/01/2014    Urine Drug Screen:     Component Value Date/Time   LABOPIA NONE DETECTED 03/12/2015 0253   LABOPIA NEGATIVE 02/24/2011 1007   COCAINSCRNUR NONE DETECTED 03/12/2015 0253   COCAINSCRNUR NEGATIVE 02/24/2011 1007   LABBENZ NONE DETECTED 03/12/2015 0253   LABBENZ NEGATIVE 02/24/2011 1007   AMPHETMU NONE DETECTED 03/12/2015 0253   AMPHETMU NEGATIVE 02/24/2011 1007   THCU NONE DETECTED 03/12/2015 0253   LABBARB NONE DETECTED 03/12/2015 0253    Alcohol Level: No results for input(s): ETH in the last 168 hours.   Imaging: Dg Abd 1 View  03/12/2015  CLINICAL DATA:  Nasogastric tube placement EXAM: ABDOMEN - 1 VIEW COMPARISON:  None. FLUOROSCOPY TIME:  6 minutes 0 seconds.  One submitted image FINDINGS: Nasogastric tube tip and side port in stomach. Visualized bowel gas pattern normal. IMPRESSION: Nasogastric tube tip and side port in stomach. Electronically Signed   By: Lowella Grip III M.D.   On: 03/12/2015 14:33   Ct Head Wo Contrast  03/11/2015  CLINICAL DATA:  Altered mental status.  Incontinence and vomiting. EXAM: CT HEAD WITHOUT CONTRAST TECHNIQUE: Contiguous axial images were obtained from the base of the skull through the vertex without contrast. COMPARISON:  08/14/2014 FINDINGS: There is extensive cerebral and cerebellar atrophy which is unchanged. Again noted is low density throughout the white matter which is similar to the previous examination. No evidence for acute hemorrhage, mass lesion, midline shift, hydrocephalus or new large infarct. Mucosal disease in the maxillary sinuses, left side greater than right. Some mucosal disease in the anterior ethmoid air cells. Negative for a calvarial fracture. IMPRESSION: No acute intracranial abnormality. Stable cerebral and cerebellar atrophy. Diffuse white matter changes likely related to chronic small vessel ischemic disease. Electronically Signed   By: Markus Daft M.D.   On: 03/11/2015 16:01   Dg Chest Port 1  View  03/12/2015  CLINICAL DATA:  74 year old male with central line placement EXAM: PORTABLE CHEST 1 VIEW COMPARISON:  Chest radiograph dated 03/12/2015 FINDINGS: Endotracheal tube with tip approximately 2 cm above  the carina. An enteric tube is partially visualized coursing throughout the upper abdomen. Right IJ central line with tip over central SVC. There is no pneumothorax. Linear subsegmental atelectatic changes of the left lung base. There is no focal consolidation, pleural effusion, or pneumothorax. The cardiac silhouette is within normal limits. The osseous structures are grossly unremarkable. IMPRESSION: Right IJ central line with tip over central SVC.  No pneumothorax. Electronically Signed   By: Anner Crete M.D.   On: 03/12/2015 18:55   Portable Chest Xray  03/12/2015  CLINICAL DATA:  Respiratory failure EXAM: PORTABLE CHEST 1 VIEW COMPARISON:  Portable exam 0517 hours compared to 03/11/2015 FINDINGS: Tip of endotracheal tube at carina directed into RIGHT mainstem bronchus, recommend withdrawal 2.5 cm. Normal heart size, mediastinal contours, and pulmonary vascularity. ASD occlusion device noted. Atelectasis versus infiltrate in LEFT lower lobe. Minimal RIGHT base atelectasis. Upper lungs clear. Skin fold projects over LEFT upper lobe. No definite pleural effusion or pneumothorax. IMPRESSION: Tip of endotracheal tube at carina directed into RIGHT mainstem bronchus, recommend withdrawal 2.5 cm. Atelectasis versus consolidation LEFT lower lobe with minimal RIGHT basilar atelectasis. Findings called to [place] [facility] on 03/12/2015 at 0805 hours. Electronically Signed   By: Lavonia Dana M.D.   On: 03/12/2015 08:05   Dg Chest Portable 1 View  03/11/2015  CLINICAL DATA:  Tachycardia with hypertension. Altered mental status. EXAM: PORTABLE CHEST 1 VIEW COMPARISON:  08/14/2014 FINDINGS: Patient is rotated towards the left on this examination. Evidence for foramen ovale closure device in the  heart. Lungs are clear without airspace disease or pulmonary edema. Negative for a pneumothorax. No acute bone abnormality. Difficult to evaluate heart size due to patient rotation. IMPRESSION: No acute chest findings. Electronically Signed   By: Markus Daft M.D.   On: 03/11/2015 15:13   Dg [place] Bailey G Tube Plc W/fl-no Rad  03/12/2015  CLINICAL DATA:  NASO G TUBE PLACEMENT WITH FLUORO Fluoroscopy was utilized by the requesting physician.  No radiographic interpretation.     Assessment/Plan:  73y/o hx of prior TIA, SAH, HTN, Parkinson's disease presenting with 2 day history of progressive confusion in the setting of hypertension. Patient remains intubated, family reports mental status is improving. Labs pertinent for elevated lactic acid (improved) and leukocytosis. EEG reviewed and shows moderate diffuse slowing, no epileptiform activity noted. Differential includes hypertensive encephalopathy/PRES vs seizure with prolonged post-ictal state.With leukocytosis and Tmax of 100.4 would also consider infectious etiology though lower suspicion for meningitis/encephalitis  -MRI brain pending -agree with empiric coverage with vancomycin, rocephin, ampicillin and acyclovir. If MRI unremarkable would consider LP -BP control per primary team\ -will continue to follow   Jim Like, DO [facility] [phone]  If 7pm- 7am, please page neurology on call as listed in [place]. 03/12/2015, 7:13 PM

## 2015-03-12 NOTE — Progress Notes (Signed)
CRITICAL VALUE ALERT  Critical value received:  Potassium 2.6  Date of notification:  03/12/2015  Time of notification:  0420  Critical value read back:Yes.    Nurse who received alert:  Doretha Sou  Called [facility]Link and notified MD Byrum.    Vicie Mutters, RN

## 2015-03-12 NOTE — Progress Notes (Signed)
Patient hypotensive, called MD Byrum to inform.  New orders received.

## 2015-03-12 NOTE — Procedures (Signed)
Medications: 1. Etomidate 20mg  IV 2. Rocuronium 40mg  IV  Description of Procedure: Patient laying recumbent. Etomidate administered followed by rocuronium for rapid sequence intubation. MacIntosh #4 blade utilized to visualize patient's vocal cords with ease. Endotracheal tube 8.0 passed with ease between the vocal cords. Repeat capnographic color change confirmed. Bilateral breath sounds auscultated by respiratory therapist. Endotracheal tube secured at 25 cm at the lip. Postintubation ABG ordered. Postintubation portable chest x-ray also ordered. No complications from procedure.

## 2015-03-12 NOTE — Progress Notes (Addendum)
[place] Progress Note Patient Name: Benjamin Park DOB: [birth year] MRN: [id number]   Date of Service  03/12/2015  HPI/Events of Note   patient weaned off of dopamine infusion. Continuing to require low-dose Levophed infusion with sedation requirements. Currently preparing for transport to MRI.   eICU Interventions   discontinue dopamine & use Levophed solely to maintain systolic blood pressure 0000000 & MAP 80-100 . F/u cvp on return from MRI.     Intervention Category Major Interventions: Hypotension - evaluation and management  Tera Partridge 03/12/2015, 10:25 PM

## 2015-03-12 NOTE — Progress Notes (Signed)
RT retracted pt ETT 2cm per MD order. No complications. Vital signs stable throughout. Patient tolerated well. RN at bedside. RT will continue to monitor.

## 2015-03-13 ENCOUNTER — Inpatient Hospital Stay (HOSPITAL_COMMUNITY): Payer: Medicare HMO

## 2015-03-13 DIAGNOSIS — I959 Hypotension, unspecified: Secondary | ICD-10-CM

## 2015-03-13 DIAGNOSIS — G9340 Encephalopathy, unspecified: Secondary | ICD-10-CM

## 2015-03-13 LAB — BLOOD GAS, ARTERIAL
ACID-BASE DEFICIT: 1.3 mmol/L (ref 0.0–2.0)
Bicarbonate: 22.2 mEq/L (ref 20.0–24.0)
DRAWN BY: 44135
FIO2: 0.4
MECHVT: 580 mL
O2 SAT: 98.5 %
PATIENT TEMPERATURE: 98.6
PEEP/CPAP: 5 cmH2O
PH ART: 7.448 (ref 7.350–7.450)
RATE: 18 resp/min
TCO2: 23.2 mmol/L (ref 0–100)
pCO2 arterial: 32.5 mmHg — ABNORMAL LOW (ref 35.0–45.0)
pO2, Arterial: 119 mmHg — ABNORMAL HIGH (ref 80.0–100.0)

## 2015-03-13 LAB — GLUCOSE, CAPILLARY
GLUCOSE-CAPILLARY: 113 mg/dL — AB (ref 65–99)
GLUCOSE-CAPILLARY: 121 mg/dL — AB (ref 65–99)
GLUCOSE-CAPILLARY: 127 mg/dL — AB (ref 65–99)
GLUCOSE-CAPILLARY: 205 mg/dL — AB (ref 65–99)
Glucose-Capillary: 142 mg/dL — ABNORMAL HIGH (ref 65–99)
Glucose-Capillary: 81 mg/dL (ref 65–99)

## 2015-03-13 LAB — CBC
HEMATOCRIT: 35.3 % — AB (ref 39.0–52.0)
Hemoglobin: 12.6 g/dL — ABNORMAL LOW (ref 13.0–17.0)
MCH: 34 pg (ref 26.0–34.0)
MCHC: 35.7 g/dL (ref 30.0–36.0)
MCV: 95.1 fL (ref 78.0–100.0)
Platelets: 195 10*3/uL (ref 150–400)
RBC: 3.71 MIL/uL — ABNORMAL LOW (ref 4.22–5.81)
RDW: 13.2 % (ref 11.5–15.5)
WBC: 15.8 10*3/uL — AB (ref 4.0–10.5)

## 2015-03-13 LAB — BASIC METABOLIC PANEL
ANION GAP: 9 (ref 5–15)
Anion gap: 9 (ref 5–15)
BUN: 17 mg/dL (ref 6–20)
BUN: 12 mg/dL (ref 6–20)
CALCIUM: 8 mg/dL — AB (ref 8.9–10.3)
CHLORIDE: 108 mmol/L (ref 101–111)
CO2: 21 mmol/L — ABNORMAL LOW (ref 22–32)
CO2: 25 mmol/L (ref 22–32)
CREATININE: 0.95 mg/dL (ref 0.61–1.24)
Calcium: 7.6 mg/dL — ABNORMAL LOW (ref 8.9–10.3)
Chloride: 109 mmol/L (ref 101–111)
Creatinine, Ser: 1.17 mg/dL (ref 0.61–1.24)
GFR calc Af Amer: >60 mL/min (ref >60)
GFR calc Af Amer: >60 mL/min (ref >60)
GLUCOSE: 154 mg/dL — AB (ref 65–99)
GLUCOSE: 94 mg/dL (ref 65–99)
POTASSIUM: 2.9 mmol/L — AB (ref 3.5–5.1)
Potassium: 3.3 mmol/L — ABNORMAL LOW (ref 3.5–5.1)
SODIUM: 143 mmol/L (ref 135–145)
Sodium: 138 mmol/L (ref 135–145)

## 2015-03-13 LAB — MAGNESIUM
MAGNESIUM: 2.3 mg/dL (ref 1.7–2.4)
Magnesium: 1.6 mg/dL — ABNORMAL LOW (ref 1.7–2.4)

## 2015-03-13 LAB — URINE CULTURE: Culture: NO GROWTH

## 2015-03-13 LAB — PHOSPHORUS
Phosphorus: <1 mg/dL — CL (ref 2.5–4.6)
Phosphorus: 1.7 mg/dL — ABNORMAL LOW (ref 2.5–4.6)

## 2015-03-13 LAB — PROCALCITONIN: PROCALCITONIN: 0.13 ng/mL

## 2015-03-13 MED ORDER — HYDRALAZINE HCL 20 MG/ML IJ SOLN
10.0000 mg | INTRAMUSCULAR | Status: DC | PRN
  Administered 2015-03-13: 10 mg via INTRAVENOUS
  Administered 2015-03-14 – 2015-03-17 (×4): 20 mg via INTRAVENOUS
  Filled 2015-03-13 (×7): qty 1

## 2015-03-13 MED ORDER — CETYLPYRIDINIUM CHLORIDE 0.05 % MT LIQD
7.0000 mL | Freq: Two times a day (BID) | OROMUCOSAL | Status: DC
  Administered 2015-03-13 – 2015-03-18 (×10): 7 mL via OROMUCOSAL

## 2015-03-13 MED ORDER — POTASSIUM PHOSPHATES 15 MMOLE/5ML IV SOLN
30.0000 mmol | Freq: Once | INTRAVENOUS | Status: AC
  Administered 2015-03-13: 30 mmol via INTRAVENOUS
  Filled 2015-03-13: qty 10

## 2015-03-13 MED ORDER — PANTOPRAZOLE SODIUM 40 MG PO PACK
40.0000 mg | PACK | Freq: Every day | ORAL | Status: DC
  Administered 2015-03-13: 40 mg
  Filled 2015-03-13: qty 20

## 2015-03-13 MED ORDER — MAGNESIUM SULFATE 2 GM/50ML IV SOLN: 2.0000 g | Freq: Once | INTRAVENOUS | Status: DC

## 2015-03-13 MED ORDER — MAGNESIUM SULFATE 2 GM/50ML IV SOLN
2.0000 g | Freq: Once | INTRAVENOUS | Status: AC
  Administered 2015-03-13: 2 g via INTRAVENOUS
  Filled 2015-03-13: qty 50

## 2015-03-13 MED ORDER — POTASSIUM CHLORIDE 10 MEQ/50ML IV SOLN
10.0000 meq | Freq: Every morning | INTRAVENOUS | Status: DC
  Administered 2015-03-13 – 2015-03-15 (×4): 10 meq via INTRAVENOUS
  Filled 2015-03-13 (×4): qty 50

## 2015-03-13 MED ORDER — POTASSIUM CHLORIDE 20 MEQ/15ML (10%) PO SOLN
40.0000 meq | Freq: Once | ORAL | Status: AC
  Administered 2015-03-13: 40 meq
  Filled 2015-03-13: qty 30

## 2015-03-13 MED ORDER — LEVOTHYROXINE SODIUM 112 MCG PO TABS
112.0000 ug | ORAL_TABLET | Freq: Every day | ORAL | Status: DC
  Administered 2015-03-13: 112 ug via ORAL
  Filled 2015-03-13: qty 1

## 2015-03-13 MED ORDER — MIDAZOLAM HCL 2 MG/2ML IJ SOLN
1.0000 mg | INTRAMUSCULAR | Status: DC | PRN
  Administered 2015-03-13: 4 mg via INTRAVENOUS

## 2015-03-13 MED ORDER — MIDAZOLAM HCL 2 MG/2ML IJ SOLN
INTRAMUSCULAR | Status: AC
  Filled 2015-03-13: qty 4

## 2015-03-13 MED ORDER — LACTATED RINGERS IV BOLUS (SEPSIS)
1000.0000 mL | Freq: Once | INTRAVENOUS | Status: AC
  Administered 2015-03-13: 1000 mL via INTRAVENOUS

## 2015-03-13 NOTE — Progress Notes (Signed)
[place] Progress Note Patient Name: Benjamin Park DOB: 18-Jun-1940 MRN: [id number]   Date of Service  03/13/2015  HPI/Events of Note  Hypophosphatemia, Hypokalemia, & Hypomagnesemia.  eICU Interventions  Replace All & repeat BMP, Mag, & Phos at noon.     Intervention Category Major Interventions: Electrolyte abnormality - evaluation and management  Tera Partridge 03/13/2015, 4:28 AM

## 2015-03-13 NOTE — Progress Notes (Signed)
Patient transported on vent to MRI and back to room 123456 without complications.

## 2015-03-13 NOTE — Progress Notes (Signed)
[place] Progress Note Patient Name: Benjamin Park DOB: 1940/08/29 MRN: [id number]   Date of Service  03/13/2015  HPI/Events of Note  CVP <10 per RN. Patient back from MRI. Read pending.  eICU Interventions  LR 1L bolus over  1 hour & repeat CVP. Plan for more fluid if <10.     Intervention Category Intermediate Interventions: Hypovolemia - evaluation and management  Tera Partridge 03/13/2015, 3:14 AM

## 2015-03-13 NOTE — Procedures (Signed)
Extubation Procedure Note  Patient Details:   Name: Benjamin Park DOB: 11/19/1940 MRN: [id number]   Airway Documentation:  Airway 8 mm (Active)  Secured at (cm) 25 cm 03/13/2015  7:54 AM  Measured From Lips 03/13/2015  7:54 AM  Secured Location Left 03/13/2015  7:27 AM  Secured By [employer] 03/13/2015  7:27 AM  Tube Holder Repositioned Yes 03/13/2015  7:27 AM  Cuff Pressure (cm H2O) 24 cm H2O 03/12/2015  7:46 AM  Site Condition Dry 03/13/2015  7:27 AM    Evaluation  O2 sats: stable throughout Complications: No apparent complications Patient did tolerate procedure well. Bilateral Breath Sounds: Clear, Diminished   Yes.  Extubated per dr.'s orders. Vital signs stable  Josehua Hammar V 03/13/2015, 9:57 AM

## 2015-03-13 NOTE — Progress Notes (Signed)
[place] Progress Note Patient Name: Benjamin Park DOB: [birth year] MRN: [id number]   Date of Service  03/13/2015  HPI/Events of Note  sbp 169 and map 134.  HR 84. Wife says this bp is too high. RN says current labetalol order has sbp goal < 140  eICU Interventions  Hydralazine prn for sbp < 150     Intervention Category Intermediate Interventions: Hypertension - evaluation and management  Alyssa Mancera 03/13/2015, 11:37 PM

## 2015-03-13 NOTE — Progress Notes (Signed)
PT Cancellation Note  Patient Details Name: Benjamin Park MRN: [id number] DOB: [birth year]   Cancelled Treatment:    Reason Eval/Treat Not Completed: Medical issues which prohibited therapy (Just extubated. Will check back Monday. Thanks. )   [name], [name] 03/13/2015, 10:49 AM  [name] [phone] (pager)

## 2015-03-13 NOTE — Progress Notes (Signed)
CRITICAL VALUE ALERT  Critical value received:  Phosphorus <1.0   Date of notification:  03/13/15  Time of notification:  [id number]  Critical value read back:Yes.    Nurse who received alert:  Lexi   MD notified (1st page):  Dr. Ashok Cordia  Time of first page:  0424  MD notified (2nd page):  Time of second page:  Responding MD:  Dr. Ashok Cordia  Time MD responded:  [phone]

## 2015-03-13 NOTE — Evaluation (Signed)
Clinical/Bedside Swallow Evaluation Patient Details  Name: Benjamin Park MRN: [id number] Date of Birth: 31-May-1940  Today's Date: 03/13/2015 Time: SLP Start Time (ACUTE ONLY): [id number] SLP Stop Time (ACUTE ONLY): 1609 SLP Time Calculation (min) (ACUTE ONLY): 12 min  Past Medical History:  Past Medical History  Diagnosis Date  . Hx of transient ischemic attack (TIA)   . Hyperlipidemia   . ED (erectile dysfunction)   . Personal history of kidney stones   . Subarachnoid hemorrhage ([place])     '11- hospitalized -3 to 4 days(passed out-awoke in hospital)- no residual affects.  . Myocardial infarction ([place])     12/1991  . Dysphagia   . Hypokalemia   . Hypothyroidism   . Sleep apnea     on CPAP-settings 6  . History of kidney stones 02-27-13    x5- none recent  . Throat cancer [facility])     '00-radiation therapy  . Stroke ([place])   . Unspecified cerebral artery occlusion with cerebral infarction 11/08/2012  . Episodic confusion 02-27-13    hx. of, not currently(multiple episodes- ? TIA's)-always saw MD to be evaluated.  Marland Kitchen AKI (acute kidney injury) ([place]) 02/23/2011    pt. denies any problems 02-27-13  . Hypertension     hx. labile - with spikes every 3-6 mos.-last 8'14 ED visit.  Marland Kitchen Hypertensive encephalopathy   . Headache(784.0)     episodic with periods of confusion -multiple  . Neuromuscular disorder ([place])     "parkinson's" -no tremor-sees neurology MD at [facility]  . Coronary artery disease     Dr. Martinique   Past Surgical History:  Past Surgical History  Procedure Laterality Date  . Angioplasty  1993    LAD  . Cardiac catheterization    . Coronary angioplasty    . Basal cell cancer      head  . Hernia repair    . Appendectomy    . Patent foramen ovale closure  2008  . Eye surgery    . Colonoscopy N/A 06/18/2012    Procedure: COLONOSCOPY;  Surgeon: Jeryl Columbia, MD;  Location[facility] END[facility]COPY;  Service: Endoscopy;  Laterality: N/A;  h&p in file-[facility]   . Hot hemostasis N/A 06/18/2012    Procedure: HOT HEMOSTASIS (ARGON PLASMA COAGULATION/BICAP);  Surgeon: Jeryl Columbia, MD;  Location[facility]COPY;  Service: Endoscopy;  Laterality: N/A;  . Colonoscopy with propofol N/A 03/07/2013    Procedure: COLONOSCOPY WITH PROPOFOL;  Surgeon: Jeryl Columbia, MD;  Location: [facility] ENDOSCOPY;  Service: Endoscopy;  Laterality: N/A;   HPI:  74 y.o. male past medical history of hypothyroidism and multiple admissions for acute encephalopathy attributed to hypertensive encephalopathy. Additional PMH includes [place] 2011, dysphagia, throat cancer, HTN, Parkinsons. MRI two small adjacent cortical ischemic infarcts measuring up to 8 mm within the high right parietal lobe near the vertex. Intubated less than 24 hours. CXR Progression of bibasilar atelectasis. Multiple MBS, most recent 08/16/14 Dys 1 and pudding thick liquids, MBS 02/02/14 recommended Dys 1, pudding thick liquids.    Assessment / Plan / Recommendation Clinical Impression  Pt has history of dysphagia, some confusion and unable to provide reliable details. Two previous MBS (2016 and 2015) have recommended Dys 1 texture and pudding thick liquids. Pt report drinking thin liquids at home. Each ice chip and teaspoon trial water resulted in immediate hard cough indicative of airway penetration and/or aspiration. Suspect delayed swallow initiation with puree. Recommend continue NPO; currently receiving tube feeds. ST will return Monday. Continue oral care.  Aspiration Risk   (mod-severe)    Diet Recommendation NPO   Medication Administration: Via alternative means    Other  Recommendations Oral Care Recommendations: Oral care QID   Follow up Recommendations   (TBD)    Frequency and Duration min 2x/week  2 weeks       Prognosis Prognosis for Safe Diet Advancement:  (fair-good) Barriers to Reach Goals: Cognitive deficits      Swallow Study   General HPI: 74 y.o. male past medical history of hypothyroidism and multiple admissions for acute  encephalopathy attributed to hypertensive encephalopathy. Additional PMH includes [place] 2011, dysphagia, throat cancer, HTN, Parkinsons. MRI two small adjacent cortical ischemic infarcts measuring up to 8 mm within the high right parietal lobe near the vertex. Intubated less than 24 hours. CXR Progression of bibasilar atelectasis. Multiple MBS, most recent 08/16/14 Dys 1 and pudding thick liquids, MBS 02/02/14 recommended Dys 1, pudding thick liquids.  Type of Study: Bedside Swallow Evaluation Previous Swallow Assessment:  (see HPI) Diet Prior to this Study: NPO;NG Tube Temperature Spikes Noted: No Respiratory Status: Nasal cannula History of Recent Intubation: Yes Length of Intubations (days):  (less than 24 hours) Date extubated: 03/13/15 (10 am) Behavior/Cognition: Alert;Cooperative;Pleasant mood;Confused;Requires cueing Oral Cavity Assessment: Within Functional Limits Oral Care Completed by SLP: No Oral Cavity - Dentition: Edentulous Vision: Functional for self-feeding Self-Feeding Abilities: Able to feed self Patient Positioning: Upright in bed Baseline Vocal Quality: Hoarse;Low vocal intensity Volitional Cough: Strong Volitional Swallow: Able to elicit    Oral/Motor/Sensory Function Overall Oral Motor/Sensory Function: Within functional limits   Ice Chips Ice chips: Impaired Presentation: Spoon Pharyngeal Phase Impairments: Cough - Immediate   Thin Liquid Thin Liquid: Impaired Presentation: Spoon Pharyngeal  Phase Impairments: Cough - Immediate    Nectar Thick Nectar Thick Liquid: Not tested   Honey Thick Honey Thick Liquid: Not tested   Puree Puree: Impaired Presentation: Spoon Pharyngeal Phase Impairments: Suspected delayed Swallow   Solid Solid: Not tested       [name] 03/13/2015,4:29 PM  [name] [phone]

## 2015-03-13 NOTE — Progress Notes (Signed)
PULMONARY / CRITICAL CARE MEDICINE   Name: Benjamin Park MRN: [id number] DOB: 1940-12-06    ADMISSION DATE:  03/11/2015 CONSULTATION DATE:  03/11/2015  REFERRING MD:  Aileen Fass TRH  CHIEF COMPLAINT:  AMS  HISTORY OF PRESENT ILLNESS:   74 year old male past medical history as below, which includes uncontrolled hypertension with history of hypertensive encephalopathy admissions, TIA, subarachnoid hemorrhage, MI, hypothyroidism, obstructive sleep apnea, and acute kidney injury. Is also recorded in prior cardiology office notes that he has a history of chronic aspiration which as been well-documented on swallowing evaluations. It is noted that he refuses to use a thickening solution. Most recent admission is just about a year ago for hypertensive encephalopathy. At baseline the patient is very functional and requires no assistance to perform his ADLs.  12/22 he presented to [facility] emergency department complaining of a 2 day history of altered mental status. On the day of admission she found him on the floor and his own urine. She describes no chest pain, shortness breath, fever, GI symptoms in the preceding days. In the emergency department he was profoundly hypertensive with a systolic blood pressure [id number]. The patient was reportedly unresponsive time of arrival to medical floor at around 8 PM. There was increasing concern for sepsis of uncertain etiology. Resuscitation fluids were administered and he was started on IV antibiotics including meningitis coverage. ABG showed hypoxia. Lactic acid was initially elevated but since improved, however, has not entirely cleared to this point. PCCM was consulted for further evaluation.  SUBJECTIVE:  Awake and interactive this AM, MRI noted.  VITAL SIGNS: BP 132/91 mmHg  Pulse 79  Temp(Src) 98.1 F (36.7 C) (Oral)  Resp 14  Ht 6' (1.829 m)  Wt 75.6 kg (166 lb 10.7 oz)  BMI 22.60 kg/m2  SpO2 100%  HEMODYNAMICS: CVP:  [4 mmHg-15 mmHg] 15  mmHg  VENTILATOR SETTINGS: Vent Mode:  [-] PSV;CPAP FiO2 (%):  [40 %] 40 % Set Rate:  [18 bmp] 18 bmp Vt Set:  [580 mL] 580 mL PEEP:  [5 cmH20] 5 cmH20 Pressure Support:  [5 cmH20] 5 cmH20 Plateau Pressure:  [15 cmH20-18 cmH20] 18 cmH20  INTAKE / OUTPUT: I/O last 3 completed shifts: In: 8497.5 [I.V.:1820; Other:1072.5; NG/GT:75; IV [id number] Out: [id number]  PHYSICAL EXAMINATION: General:  Elderly chronically ill appearing male Neuro:  Awake and interactive, moving all ext to commands. HEENT:  [place]/AT, PERRL, no appreciable JVD.  Cardiovascular:  RRR, Nl S1/S2, -M/R/G. Lungs: Bibasilar crackles.  Abdomen:  Soft, non-tender, non-distended Musculoskeletal:  No acute deformity or ROM limitation. Skin:  Grossly intact, diaphoretic  LABS:  BMET  Recent Labs Lab 03/12/15 0243 03/12/15 0715 03/13/15 0330  NA 135 138 138  K 2.6* 3.7 2.9*  CL 101 106 108  CO2 25 22 21*  BUN 16 19 17   CREATININE 0.90 1.21 1.17  GLUCOSE 151* 128* 154*   Electrolytes  Recent Labs Lab 03/12/15 0243 03/12/15 0715 03/13/15 0330  CALCIUM 7.6* 7.4* 7.6*  MG  --   --  1.6*  PHOS  --   --  <1.0*    CBC  Recent Labs Lab 03/12/15 0243 03/12/15 0715 03/13/15 0330  WBC 17.0* 19.6* 15.8*  HGB 15.1 13.7 12.6*  HCT 43.4 40.0 35.3*  PLT 190 163 195    Coag's  Recent Labs Lab 03/11/15 1510 03/11/15 2144  APTT  --  35  INR 1.07  --     Sepsis Markers  Recent Labs Lab 03/11/15 2144 03/12/15 0125  03/12/15 1400 03/12/15 2000 03/13/15 0330  LATICACIDVEN 2.3* 1.6 1.1 0.8  --   PROCALCITON <0.10  --   --   --  0.13    ABG  Recent Labs Lab 03/11/15 2115 03/12/15 0404 03/13/15 0420  PHART 7.443 7.487* 7.448  PCO2ART 36.0 31.9* 32.5*  PO2ART 78.9* 199.0* 119*    Liver Enzymes  Recent Labs Lab 03/11/15 1510  AST 43*  ALT 23  ALKPHOS 90  BILITOT 1.1  ALBUMIN 4.3    Cardiac Enzymes  Recent Labs Lab 03/12/15 0243 03/12/15 2014  TROPONINI 0.14*  0.07*    Glucose  Recent Labs Lab 03/12/15 1131 03/12/15 1655 03/12/15 2001 03/12/15 2325 03/13/15 0330 03/13/15 0733  GLUCAP 108* 112* 116* 128* 142* 205*    Imaging Dg Abd 1 View  03/12/2015  CLINICAL DATA:  Nasogastric tube placement EXAM: ABDOMEN - 1 VIEW COMPARISON:  None. FLUOROSCOPY TIME:  6 minutes 0 seconds.  One submitted image FINDINGS: Nasogastric tube tip and side port in stomach. Visualized bowel gas pattern normal. IMPRESSION: Nasogastric tube tip and side port in stomach. Electronically Signed   By: Lowella Grip III M.D.   On: 03/12/2015 14:33   Mr Brain Wo Contrast  03/13/2015  CLINICAL DATA:  Initial evaluation for acute encephalopathy. Found unresponsive. EXAM: MRI HEAD WITHOUT CONTRAST TECHNIQUE: Multiplanar, multiecho pulse sequences of the brain and surrounding structures were obtained without intravenous contrast. COMPARISON:  Prior CT from 03/11/2015, previous MRI from 02/01/2014, as well as multiple previous studies. FINDINGS: Abnormal diffusion-weighted signal and T2 hyperintensity involving the corpus callosum, middle cerebellar peduncle is, and scattered throughout the bilateral frontal and parietal white matter overall a is likely not significantly changed relative to previous MRI. Slight increased prominence of high signal intensity on DWI images within these affected areas favored to be related to technique as compared to previous scan. Patchy T2 hyperintensities elsewhere in the cerebral white matter bilaterally do not appear significantly changed, most consistent with moderate chronic small vessel ischemic disease. Advanced cerebral and cerebellar atrophy is unchanged. There are 2 small adjacent subtle foci of restricted diffusion involving the cortical gray matter of the high right parietal lobe near the vertex (series 4, image 47). The larger of these foci of measures 8 mm. These are not seen on prior study, and likely reflects small cortical infarcts.  No associated hemorrhage or mass effect. Major intracranial vascular flow voids are preserved. Small focus of susceptibility artifact within the left parietal lobe is noted, unchanged. No mass lesion, midline shift, or mass effect. Ventricular prominence related to global parenchymal volume loss present without hydrocephalus. No extra-axial fluid collection. Craniocervical junction within normal limits. Pituitary gland normal.  No acute abnormality about the orbits. Moderate polypoid opacity within the left maxillary sinus present. Scattered mucosal thickening within the right maxillary sinus, ethmoidal air cells, and sphenoid sinuses. Small amount of layering opacity within the posterior nasopharynx. Patient is likely intubated. Trace opacity within the left mastoid air cells. Inner ear structures grossly normal. Bone marrow signal intensity within normal limits. No scalp soft tissue abnormality. IMPRESSION: 1. Two small adjacent cortical ischemic infarcts measuring up to 8 mm within the high right parietal lobe near the vertex. No associated hemorrhage or significant mass effect. 2. Otherwise unchanged advanced cerebral atrophy with chronic small vessel ischemic disease. Chronic signal changes in corpus callosum, middle cerebellar peduncles, and cerebral white matter are otherwise stable. Electronically Signed   By: Jeannine Boga M.D.   On: 03/13/2015 05:20  Dg Chest Port 1 View  03/12/2015  CLINICAL DATA:  74 year old male with central line placement EXAM: PORTABLE CHEST 1 VIEW COMPARISON:  Chest radiograph dated 03/12/2015 FINDINGS: Endotracheal tube with tip approximately 2 cm above the carina. An enteric tube is partially visualized coursing throughout the upper abdomen. Right IJ central line with tip over central SVC. There is no pneumothorax. Linear subsegmental atelectatic changes of the left lung base. There is no focal consolidation, pleural effusion, or pneumothorax. The cardiac silhouette is  within normal limits. The osseous structures are grossly unremarkable. IMPRESSION: Right IJ central line with tip over central SVC.  No pneumothorax. Electronically Signed   By: Anner Crete M.D.   On: 03/12/2015 18:55   Dg Addison Bailey G Tube Plc W/fl-no Rad  03/12/2015  CLINICAL DATA:  NASO G TUBE PLACEMENT WITH FLUORO Fluoroscopy was utilized by the requesting physician.  No radiographic interpretation.    STUDIES:  CT head 12/22 > No acute intracranial abnormality. Stable cerebral and cerebellar atrophy. Diffuse white matter changes likely related to chronic small vessel ischemic disease.  CULTURES: Blood 12/22 > Urine 12/22 > Sputum 12/22 >  ANTIBIOTICS: Ampicillin 12/22 > Ceftriaxone 12/22 > Pip/tazo 12/22 > Vancomycin 12/22 >  SIGNIFICANT EVENTS: 12/23 > admit for acute encephalopathy  LINES/TUBES: PIV R IJ TLC 12/23>>> R radial a-line 12/23>>> ETT 12/22>>>12/24  DISCUSSION: 74 year old male with past medical history including labile hypertension with episodes of hypertensive encephalopathy and past, and Parkinson's disease. He is admitted 12/22 with altered mental status 2 days. Etiology uncertain at time of admission. Mental status continued to decline to the point with patient became unresponsive. He was thought to be septic without clear source was covered for meningitis and admitted to the stepdown unit under the hospitalist team. PCCM was consulted due to worsening mental status. Patient is obtunded at the time of evaluation. He is not protecting his airway. Suspect that this is secondary to hypertensive encephalopathy base of the patient's history. Will obtain EEG and MRI. There is concern of aspiration based on his history as well. Suspicion for meningitis is low, however, in absence of obvious source we will continue broad antibiotic treatment for this. We'll transfer patient to ICU, and discuss plans moving forward with his wife re: Intubation other aggressive measures.  If she is in favor of these patient will most likely be intubated on arrival to ICU.  ASSESSMENT / PLAN:  PULMONARY A: At risk for intubation due to poor airway protection Chronic aspiration OSA uncertain if prescribed CPAP but is generally non-compliant Unable to protect airway and was intubated. P:   SBT to extubate today IS per RT protocol Titrate O2 for sats. Ambulate.  CARDIOVASCULAR A:  Hypertensive crisis H/o CAD, MI, labile HTN, PFO closure  P:  Telemetry monitoring. Levophed for BP support, hopefully able to titrate to off once extubated. Echo per cards. KVO IVF. Hold home PO antihypertensives  (clonidine, labetalol)  RENAL A:   Acute renal failure  P:   KVO IVF BMET at noon and in AM. Strict I&O Replace electrolytes as indicated.  GASTROINTESTINAL A:   No acute issues  P:   D/C TF Swallow evaluation. IV protonix  HEMATOLOGIC A:   No acute issues  P:  Follow hematocrit and hemoglobin Heparin for VTE ppx  INFECTIOUS A:   SIRS (fever, leukocytosis w L shift, tachycardia) ? Sepsis, no obvious source. Concern aspiration. Doubt meningitis, however in absence of other source will continue to treat  P:   ABX  as above (vanc, amp, rocephin), may d/c acyclovir and amp if ok with neuro. Initial procalcitonin nml, continue to trend Trend WBC and fever curve  ENDOCRINE A:   Hypothyroidism Hyperglycemia without history of DM  P:   CBG monitoring and SSI. TSH, free T4 noted. Synthroid to IV 1/2 dose.  NEUROLOGIC A:   Acute encephalopathy. Unclear etiology. Parkinson's disease  P:   RASS goal: 0 UDS negative MRI brain without contrast EEG in AM  FAMILY  - Updates: No family bedside.  - Inter-disciplinary family meet or Palliative Care meeting due by:  03/18/2015  The patient is critically ill with multiple organ systems failure and requires high complexity decision making for assessment and support, frequent evaluation and titration  of therapies, application of advanced monitoring technologies and extensive interpretation of multiple databases.   Critical Care Time devoted to patient care services described in this note is  31  Minutes. This time reflects time of care of this signee Dr Jennet Maduro. This critical care time does not reflect procedure time, or teaching time or supervisory time of PA/NP/Med student/Med Resident etc but could involve care discussion time.  Rush Farmer, M.D. [facility] Pulmonary/Critical Care Medicine. Pager: [phone]. After hours pager: [phone].  03/13/2015 9:02 AM

## 2015-03-14 ENCOUNTER — Inpatient Hospital Stay (HOSPITAL_COMMUNITY): Payer: Medicare HMO

## 2015-03-14 DIAGNOSIS — I10 Essential (primary) hypertension: Secondary | ICD-10-CM

## 2015-03-14 DIAGNOSIS — I674 Hypertensive encephalopathy: Principal | ICD-10-CM

## 2015-03-14 LAB — CBC
HEMATOCRIT: 37.6 % — AB (ref 39.0–52.0)
Hemoglobin: 13.1 g/dL (ref 13.0–17.0)
MCH: 33.8 pg (ref 26.0–34.0)
MCHC: 34.8 g/dL (ref 30.0–36.0)
MCV: 96.9 fL (ref 78.0–100.0)
PLATELETS: 135 10*3/uL — AB (ref 150–400)
RBC: 3.88 MIL/uL — ABNORMAL LOW (ref 4.22–5.81)
RDW: 13.2 % (ref 11.5–15.5)
WBC: 12 10*3/uL — AB (ref 4.0–10.5)

## 2015-03-14 LAB — BASIC METABOLIC PANEL
ANION GAP: 8 (ref 5–15)
BUN: 9 mg/dL (ref 6–20)
CO2: 27 mmol/L (ref 22–32)
CREATININE: 0.86 mg/dL (ref 0.61–1.24)
Calcium: 8 mg/dL — ABNORMAL LOW (ref 8.9–10.3)
Chloride: 106 mmol/L (ref 101–111)
GLUCOSE: 168 mg/dL — AB (ref 65–99)
POTASSIUM: 2.8 mmol/L — AB (ref 3.5–5.1)
Sodium: 141 mmol/L (ref 135–145)

## 2015-03-14 LAB — GLUCOSE, CAPILLARY
GLUCOSE-CAPILLARY: 105 mg/dL — AB (ref 65–99)
GLUCOSE-CAPILLARY: 113 mg/dL — AB (ref 65–99)
GLUCOSE-CAPILLARY: 90 mg/dL (ref 65–99)
GLUCOSE-CAPILLARY: 91 mg/dL (ref 65–99)
Glucose-Capillary: 126 mg/dL — ABNORMAL HIGH (ref 65–99)

## 2015-03-14 LAB — PHOSPHORUS: PHOSPHORUS: 2.1 mg/dL — AB (ref 2.5–4.6)

## 2015-03-14 LAB — VANCOMYCIN, TROUGH: VANCOMYCIN TR: 8 ug/mL — AB (ref 10.0–20.0)

## 2015-03-14 LAB — PROCALCITONIN: Procalcitonin: <0.1 ng/mL

## 2015-03-14 LAB — MAGNESIUM: Magnesium: 2.2 mg/dL (ref 1.7–2.4)

## 2015-03-14 MED ORDER — VANCOMYCIN HCL IN DEXTROSE 750-5 MG/150ML-% IV SOLN
750.0000 mg | Freq: Three times a day (TID) | INTRAVENOUS | Status: DC
Start: 2015-03-14 — End: 2015-03-14
  Administered 2015-03-14: 750 mg via INTRAVENOUS
  Filled 2015-03-14 (×3): qty 150

## 2015-03-14 MED ORDER — ASPIRIN 300 MG RE SUPP
300.0000 mg | Freq: Every day | RECTAL | Status: DC
  Administered 2015-03-14 – 2015-03-15 (×2): 300 mg via RECTAL
  Filled 2015-03-14 (×2): qty 1

## 2015-03-14 MED ORDER — POTASSIUM CHLORIDE 10 MEQ/50ML IV SOLN
10.0000 meq | INTRAVENOUS | Status: AC
Start: 2015-03-14 — End: 2015-03-14
  Administered 2015-03-14 (×4): 10 meq via INTRAVENOUS
  Filled 2015-03-14 (×4): qty 50

## 2015-03-14 MED ORDER — POTASSIUM CHLORIDE 10 MEQ/100ML IV SOLN: 10.0000 meq | INTRAVENOUS | Status: DC

## 2015-03-14 MED ORDER — POTASSIUM CHLORIDE 20 MEQ/15ML (10%) PO SOLN: 20.0000 meq | Freq: Two times a day (BID) | ORAL | Status: DC

## 2015-03-14 MED ORDER — LEVOTHYROXINE SODIUM 100 MCG IV SOLR
56.0000 ug | Freq: Every day | INTRAVENOUS | Status: DC
  Administered 2015-03-14 – 2015-03-15 (×2): 56 ug via INTRAVENOUS
  Filled 2015-03-14 (×2): qty 5

## 2015-03-14 MED ORDER — POTASSIUM CHLORIDE 20 MEQ PO PACK: 20.0000 meq | PACK | Freq: Two times a day (BID) | ORAL | Status: DC

## 2015-03-14 MED ORDER — LABETALOL HCL 100 MG PO TABS
100.0000 mg | ORAL_TABLET | Freq: Three times a day (TID) | ORAL | Status: DC
  Filled 2015-03-14: qty 1

## 2015-03-14 MED ORDER — POTASSIUM PHOSPHATES 15 MMOLE/5ML IV SOLN
15.0000 mmol | Freq: Once | INTRAVENOUS | Status: AC
  Administered 2015-03-14: 15 mmol via INTRAVENOUS
  Filled 2015-03-14: qty 5

## 2015-03-14 MED ORDER — POTASSIUM CHLORIDE 10 MEQ/50ML IV SOLN
INTRAVENOUS | Status: AC
  Filled 2015-03-14: qty 150

## 2015-03-14 MED ORDER — POTASSIUM CHLORIDE 10 MEQ/50ML IV SOLN
10.0000 meq | INTRAVENOUS | Status: AC
  Administered 2015-03-14 (×3): 10 meq via INTRAVENOUS

## 2015-03-14 MED ORDER — PANTOPRAZOLE SODIUM 40 MG IV SOLR
40.0000 mg | INTRAVENOUS | Status: DC
  Administered 2015-03-14 – 2015-03-15 (×2): 40 mg via INTRAVENOUS
  Filled 2015-03-14 (×2): qty 40

## 2015-03-14 MED ORDER — METOPROLOL TARTRATE 1 MG/ML IV SOLN
5.0000 mg | Freq: Four times a day (QID) | INTRAVENOUS | Status: DC
  Administered 2015-03-14 – 2015-03-15 (×4): 5 mg via INTRAVENOUS
  Filled 2015-03-14 (×5): qty 5

## 2015-03-14 NOTE — Progress Notes (Signed)
[facility] Progress Note Patient Name: Benjamin Park DOB: May 30, 1940 MRN: [id number]   Date of Service  03/14/2015  HPI/Events of Note  Very low potassium No enteric access Only received 40 mEq KCL IV today  eICU Interventions  Give another 30 mEq IV KCL Repeat BMET afterwards     Intervention Category Major Interventions: Electrolyte abnormality - evaluation and management  Simonne Maffucci 03/14/2015, 5:31 PM

## 2015-03-14 NOTE — Progress Notes (Signed)
STROKE TEAM PROGRESS NOTE   HISTORY Benjamin Park is a 74 y.o. male hx of TIA/stroke, HLD, SAH, Parkinson's admitted for 2 days of increasing confusion. At baseline family reports he is high functioning and able to perform all ADLs. 2 days ago they note he became increasingly confused. On day of admission family found him unconscious in his own urine. Admission labs pertinent for WBC 16.8, BUN 23, lactic acid 2.61. Was intubated for airway protection.   Family notes multiple episodes in the past where his blood pressure is elevated and he becomes confused. EMS reports BP of 170/100 upon their arrival. Family notes that his mental status appears to be improving compared to this morning.    SUBJECTIVE (INTERVAL HISTORY)  His family is not at the bedside.  Overall he feels his condition is unchanged. However, it is not clear that he has complete insight regarding his condition   OBJECTIVE Temp:  [97.6 F (36.4 C)-98.5 F (36.9 C)] 97.9 F (36.6 C) (12/25 1244) Pulse Rate:  [70-102] 88 (12/25 1300) Cardiac Rhythm:  [-] Normal sinus rhythm (12/25 0800) Resp:  [14-26] 18 (12/25 1300) BP: (110-177)/(77-129) 120/88 mmHg (12/25 1300) SpO2:  [92 %-100 %] 100 % (12/25 1300) Arterial Line BP: (127-166)/(66-93) 166/93 mmHg (12/24 1616)  CBC:  Recent Labs Lab 03/11/15 2144 03/12/15 0243  03/13/15 0330 03/14/15 0530  WBC 13.5* 17.0*  < > 15.8* 12.0*  NEUTROABS 12.1* 15.2*  --   --   --   HGB 16.4 15.1  < > 12.6* 13.1  HCT 48.0 43.4  < > 35.3* 37.6*  MCV 96.0 95.4  < > 95.1 96.9  PLT 169 190  < > 195 135*  < > = values in this interval not displayed.  Basic Metabolic Panel:   Recent Labs Lab 03/13/15 1200 03/14/15 0530  NA 143 141  K 3.3* 2.8*  CL 109 106  CO2 25 27  GLUCOSE 94 168*  BUN 12 9  CREATININE 0.95 0.86  CALCIUM 8.0* 8.0*  MG 2.3 2.2  PHOS 1.7* 2.1*    Lipid Panel:     Component Value Date/Time   CHOL 179 02/01/2014 0413   TRIG 90 02/01/2014 0413   HDL 44  02/01/2014 0413   CHOLHDL 4.1 02/01/2014 0413   VLDL 18 02/01/2014 0413   LDLCALC 117* 02/01/2014 0413   HgbA1c:  Lab Results  Component Value Date   HGBA1C 5.4 02/01/2014   Urine Drug Screen:     Component Value Date/Time   LABOPIA NONE DETECTED 03/12/2015 0253   LABOPIA NEGATIVE 02/24/2011 1007   COCAINSCRNUR NONE DETECTED 03/12/2015 0253   COCAINSCRNUR NEGATIVE 02/24/2011 1007   LABBENZ NONE DETECTED 03/12/2015 0253   LABBENZ NEGATIVE 02/24/2011 1007   AMPHETMU NONE DETECTED 03/12/2015 0253   AMPHETMU NEGATIVE 02/24/2011 1007   THCU NONE DETECTED 03/12/2015 0253   LABBARB NONE DETECTED 03/12/2015 0253      IMAGING  Dg Abd 1 View 03/12/2015   Nasogastric tube tip and side port in stomach.    Mr Brain Wo Contrast 03/13/2015   1. Two small adjacent cortical ischemic infarcts measuring up to 8 mm within the high right parietal lobe near the vertex. No associated hemorrhage or significant mass effect.  2. Otherwise unchanged advanced cerebral atrophy with chronic small vessel ischemic disease. Chronic signal changes in corpus callosum, middle cerebellar peduncles, and cerebral white matter are otherwise stable.     Dg Chest Port 1 View 03/13/2015   Endotracheal tube is  low, recommend withdrawal 2 cm. Progression of bibasilar atelectasis.     Dg Chest Port 1 View 03/12/2015   Right IJ central line with tip over central SVC.  No pneumothorax.     Dg Naso G Tube Plc W/fl-no Rad 03/12/2015   NASO G TUBE PLACEMENT WITH FLUORO Fluoroscopy was utilized by the requesting physician.  No radiographic interpretation.   PHYSICAL EXAM  Neurologic Examination Mental Status: Extubated ans sitting up in chair.  Able to follow most commands; oriented to self and "this is not home" but otherwise confused.  Cranial Nerves: II: optic discs not visualized, blinks to threat bilaterall, pupils equal, round, reactive to light  III,IV, VI: ptosis not present, extra-ocular  motions intact bilaterally, no gaze deviation V,VII: face symmetric,  VIII: hearing normal bilaterally IX,X: weak cough XI: shrug symmetric XII: tongue midline  Motor: Appears to have generalized weakness and some trouble understanding how to demonstrate best effort Sensory: states sense of light touch is equal Plantars: Right: downgoingLeft: downgoing Cerebellar: Patient unable to understand task Gait: unable to test   ASSESSMENT/PLAN Mr. Benjamin Park is a 74 y.o. male with history of a previous TIA/stroke, hypertensive encephalopathy admissions, coronary artery disease, hyperlipidemia, subarachnoid hemorrhage, and Parkinson's disease, presenting with a 2 day history of confusion, elevated blood pressure and elevated WBC count.  He did not receive IV t-PA due to late presentation.  Stroke:  Non-dominant infarcts possibly embolic from an unknown source.  Resultant  Confusion; also possible infection as underlying cause  MRI - Two small adjacent cortical ischemic infarcts measuring up to 8 mm within the high right parietal lobe.  MRA - not performed  EEG - 03/12/2015 - moderate diffuse slowing of the background c/w nonspecific diffuse cerebral dysfunction.  Carotid Doppler - pending  2D Echo EF 65-70%. No cardiac source of emboli identified.  LDL ordered  HgbA1c ordered  VTE prophylaxis - SCDs and subcutaneous heparin Diet NPO time specified  aspirin 325 mg daily prior to admission, now on aspirin 300 mg suppository daily  Patient counseled to be compliant with his antithrombotic medications  Ongoing aggressive stroke risk factor management  Therapy recommendations: Pending  Disposition: Pending  Hypertension  Labile blood pressures  Permissive hypertension (OK if < 220/120) but gradually normalize in 5-7 days  Hyperlipidemia  Home meds:  No lipid lowering medications prior to admission.  LDL pending, goal <  70   Suspected sepsis - chronic aspiration  Acyclovir, Rocephin, and vancomycin  Cultures pending   Other Stroke Risk Factors  Advanced age  Cigarette smoker, quit smoking 23 years ago.  Hx stroke/TIA  Coronary artery disease   Other Active Problems  Leukocytosis  Hypokalemia - supplement  PLAN  Consider TEE to evaluate for embolic source of strokes.  Continue stroke workup  Hospital day # 3  NEUROLOGY ATTENDING NOTE Patient was seen and examined by me personally. I reviewed notes, independently viewed imaging studies, participated in medical decision making and plan of care. I have made additions or clarifications directly to the above note.  Documentation accurately reflects findings. The laboratory and radiographic studies were personally reviewed by me.  ROS completed by me personally and pertinent positives fully documented.  Assessment and plan completed by me personally and fully documented above.  Condition is unchanged improved I spent 30 minutes time in the care of  this patient.  SIGNED BY: Dr. Elissa Hefty      To contact Stroke Continuity provider, please refer to [email or web address] After hours,  contact General Neurology

## 2015-03-14 NOTE — Progress Notes (Signed)
eLink Physician-Brief Progress Note Patient Name: Benjamin Park DOB: Apr 09, 1940 MRN: [id number]  [facility] Physician Progress Note and Electrolyte Replacement  Patient Name: Benjamin Park DOB: [birth year] MRN: [id number]  Date of Service  03/14/2015   HPI/Events of Note    Recent Labs Lab 03/12/15 0243 03/12/15 0715 03/13/15 0330 03/13/15 1200 03/14/15 0530  NA 135 138 138 143 141  K 2.6* 3.7 2.9* 3.3* 2.8*  CL 101 106 108 109 106  CO2 25 22 21* 25 27  GLUCOSE 151* 128* 154* 94 168*  BUN 16 19 17 12 9   CREATININE 0.90 1.21 1.17 0.95 0.86  CALCIUM 7.6* 7.4* 7.6* 8.0* 8.0*  MG  --   --  1.6* 2.3 2.2  PHOS  --   --  <1.0* 1.7* 2.1*    Estimated Creatinine Clearance: 81.8 mL/min (by C-G formula based on Cr of 0.86).  Intake/Output      12/24 0701 - 12/25 0700   I.V. (mL/kg) 123.2 (1.6)   NG/GT 1019.6   IV Piggyback 1452   Total Intake(mL/kg) 2594.8 (34.3)   Urine (mL/kg/hr) 3875 (2.1)   Total Output 3875   Net -1280.3        - I/O DETAILED x 24h    Total I/O In: 823 [NG/GT:395; IV Piggyback:428] Out: 1425 [Urine:1425] - I/O THIS SHIFT    ASSESSMENT Hypokalemia Hypophosphatemia   eICURN Interventions  kcl 10 x 4 runs k phos 15 mmol   ASSESSMENT: MAJOR ELECTROLYTE      Dr. Brand Males, M.D., [facility].C.P Pulmonary and Critical Care Medicine Staff Physician [facility] and Critical [facility] Pager: [phone], If no answer or between  15:00h - 7:00h: call [phone]  03/14/2015 6:33 AM      Intervention Category Major Interventions: Electrolyte abnormality - evaluation and management  Torrie Lafavor 03/14/2015, 6:32 AM

## 2015-03-14 NOTE — Progress Notes (Addendum)
VASCULAR LAB PRELIMINARY  PRELIMINARY  PRELIMINARY  PRELIMINARY  Carotid duplex completed.    Preliminary report:  Technically limited on the right due to central line placement and dressing. Appears to have a 1% to 39% ICA stenosis bilaterally, Right vertebral,distal CCA, and bulb could not be visualized due to the above mentioned limitation.  Ezio Wieck, [facility], RVS 03/14/2015, 5:16 PM

## 2015-03-14 NOTE — Progress Notes (Signed)
PULMONARY / CRITICAL CARE MEDICINE   Name: Benjamin Park MRN: [id number] DOB: 10/02/1940    ADMISSION DATE:  03/11/2015 CONSULTATION DATE:  03/11/2015  REFERRING MD:  Aileen Fass TRH  CHIEF COMPLAINT:  AMS  HISTORY OF PRESENT ILLNESS:   74 year old male past medical history as below, which includes uncontrolled hypertension with history of hypertensive encephalopathy admissions, TIA, subarachnoid hemorrhage, MI, hypothyroidism, obstructive sleep apnea, and acute kidney injury. Is also recorded in prior cardiology office notes that he has a history of chronic aspiration which as been well-documented on swallowing evaluations. It is noted that he refuses to use a thickening solution. Most recent admission is just about a year ago for hypertensive encephalopathy. At baseline the patient is very functional and requires no assistance to perform his ADLs.  12/22 he presented to [facility] emergency department complaining of a 2 day history of altered mental status. On the day of admission she found him on the floor and his own urine. She describes no chest pain, shortness breath, fever, GI symptoms in the preceding days. In the emergency department he was profoundly hypertensive with a systolic blood pressure [id number]. The patient was reportedly unresponsive time of arrival to medical floor at around 8 PM. There was increasing concern for sepsis of uncertain etiology. Resuscitation fluids were administered and he was started on IV antibiotics including meningitis coverage. ABG showed hypoxia. Lactic acid was initially elevated but since improved, however, has not entirely cleared to this point. PCCM was consulted for further evaluation.  SUBJECTIVE:  Awake and interactive this AM, MRI noted.  VITAL SIGNS: BP 163/100 mmHg  Pulse 76  Temp(Src) 97.6 F (36.4 C) (Oral)  Resp 17  Ht 6' (1.829 m)  Wt 75.6 kg (166 lb 10.7 oz)  BMI 22.60 kg/m2  SpO2 95%  HEMODYNAMICS: CVP:  [5 mmHg-15 mmHg] 6  mmHg  VENTILATOR SETTINGS:    INTAKE / OUTPUT: I/O last 3 completed shifts: In: 7123.3 [I.V.:1238.7; NG/GT:1094.6; IV Piggyback:4790] Out: 64 [Urine:6600]  PHYSICAL EXAMINATION: General:  Elderly chronically ill appearing male Neuro:  Awake and interactive, moving all ext to commands. HEENT:  [place]/AT, PERRL, no appreciable JVD.  Cardiovascular:  RRR, Nl S1/S2, -M/R/G. Lungs: Bibasilar crackles.  Abdomen:  Soft, non-tender, non-distended Musculoskeletal:  No acute deformity or ROM limitation. Skin:  Grossly intact, diaphoretic  LABS:  BMET  Recent Labs Lab 03/13/15 0330 03/13/15 1200 03/14/15 0530  NA 138 143 141  K 2.9* 3.3* 2.8*  CL 108 109 106  CO2 21* 25 27  BUN 17 12 9   CREATININE 1.17 0.95 0.86  GLUCOSE 154* 94 168*   Electrolytes  Recent Labs Lab 03/13/15 0330 03/13/15 1200 03/14/15 0530  CALCIUM 7.6* 8.0* 8.0*  MG 1.6* 2.3 2.2  PHOS <1.0* 1.7* 2.1*    CBC  Recent Labs Lab 03/12/15 0715 03/13/15 0330 03/14/15 0530  WBC 19.6* 15.8* 12.0*  HGB 13.7 12.6* 13.1  HCT 40.0 35.3* 37.6*  PLT 163 195 135*    Coag's  Recent Labs Lab 03/11/15 1510 03/11/15 2144  APTT  --  35  INR 1.07  --     Sepsis Markers  Recent Labs Lab 03/11/15 2144 03/12/15 0125 03/12/15 1400 03/12/15 2000 03/13/15 0330 03/14/15 0530  LATICACIDVEN 2.3* 1.6 1.1 0.8  --   --   PROCALCITON <0.10  --   --   --  0.13 <0.10    ABG  Recent Labs Lab 03/11/15 2115 03/12/15 0404 03/13/15 0420  PHART 7.443 7.487* 7.448  PCO2ART 36.0 31.9* 32.5*  PO2ART 78.9* 199.0* 119*    Liver Enzymes  Recent Labs Lab 03/11/15 1510  AST 43*  ALT 23  ALKPHOS 90  BILITOT 1.1  ALBUMIN 4.3    Cardiac Enzymes  Recent Labs Lab 03/12/15 0243 03/12/15 2014  TROPONINI 0.14* 0.07*    Glucose  Recent Labs Lab 03/13/15 1207 03/13/15 1656 03/13/15 1932 03/13/15 2347 03/14/15 0331 03/14/15 0846  GLUCAP 81 127* 121* 113* 105* 113*    Imaging No results  found.  STUDIES:  CT head 12/22 > No acute intracranial abnormality. Stable cerebral and cerebellar atrophy. Diffuse white matter changes likely related to chronic small vessel ischemic disease.  CULTURES: Blood 12/22 >NTD Urine 12/22 >NTD Sputum 12/22 >NTD  ANTIBIOTICS: Ampicillin 12/22 >12/25 Ceftriaxone 12/22 > Pip/tazo 12/22 >12/23 Vancomycin 12/22 >12/25 Acyclovir 12/22>>>12/25  SIGNIFICANT EVENTS: 12/23 > admit for acute encephalopathy  LINES/TUBES: PIV R IJ TLC 12/23>>> R radial a-line 12/23>>> ETT 12/22>>>12/24  DISCUSSION: 74 year old male with past medical history including labile hypertension with episodes of hypertensive encephalopathy and past, and Parkinson's disease. He is admitted 12/22 with altered mental status 2 days. Etiology uncertain at time of admission. Mental status continued to decline to the point with patient became unresponsive. He was thought to be septic without clear source was covered for meningitis and admitted to the stepdown unit under the hospitalist team. PCCM was consulted due to worsening mental status. Patient is obtunded at the time of evaluation. He is not protecting his airway. Suspect that this is secondary to hypertensive encephalopathy base of the patient's history. Will obtain EEG and MRI. There is concern of aspiration based on his history as well. Suspicion for meningitis is low, however, in absence of obvious source we will continue broad antibiotic treatment for this. We'll transfer patient to ICU, and discuss plans moving forward with his wife re: Intubation other aggressive measures. If she is in favor of these patient will most likely be intubated on arrival to ICU.  ASSESSMENT / PLAN:  PULMONARY A: At risk for intubation due to poor airway protection Chronic aspiration OSA uncertain if prescribed CPAP but is generally non-compliant Unable to protect airway and was intubated. P:   Titrate O2 to off. IS per RT  protocol. Titrate O2 for sats. Ambulate.  CARDIOVASCULAR A:  Hypertensive crisis H/o CAD, MI, labile HTN, PFO closure  P:  Telemetry monitoring. D/C levophed. Echo per cards. KVO IVF. Restart PO labetalol, 100 mg PO TID with holding parameters.  RENAL A:   Acute renal failure  P:   KVO IVF BMET at noon and in AM. Strict I&O Replace electrolytes as indicated.  GASTROINTESTINAL A:   No acute issues  P:   D/C TF Swallow evaluation reordered, patient failed, had an NGT but pulled it out. IV protonix  HEMATOLOGIC A:   No acute issues  P:  Follow hematocrit and hemoglobin Heparin for VTE ppx  INFECTIOUS A:   SIRS (fever, leukocytosis w L shift, tachycardia) ? Sepsis, no obvious source. Concern aspiration. Doubt meningitis, however in absence of other source will continue to treat  P:   D/C all abx but rocephin until cultures are finalized, this is not meningitis. Initial procalcitonin nml. Trend WBC and fever curve.  ENDOCRINE A:   Hypothyroidism Hyperglycemia without history of DM  P:   CBG monitoring and SSI. TSH, free T4 noted. Synthroid to IV 1/2 dose.  NEUROLOGIC A:   Acute encephalopathy. Unclear etiology. Parkinson's disease  P:  RASS goal: 0 UDS negative I reviewed MRI of the brain without contrast myself with two small adjacent cortical ischemic infarcts noted. EEG in per neuro. Further recommendations per neuro.  Discussed with TRH-MD, transfer to SDU and to [facility] service with PCCM off 12/26.  FAMILY  - Updates: Patient updated bedside.  - Inter-disciplinary family meet or Palliative Care meeting due by:  03/18/2015  Rush Farmer, M.D. [facility] Pulmonary/Critical Care Medicine. Pager: [phone]. After hours pager: [phone].  03/14/2015 9:09 AM

## 2015-03-14 NOTE — Progress Notes (Signed)
Pt pulled NG tube out, CCM notified. Patients vitals signs are stable. Will continue to monitor closely.

## 2015-03-14 NOTE — Progress Notes (Signed)
Pharmacy Antibiotic Follow-up Note  Benjamin Park is a 74 y.o. year-old male admitted on 03/11/2015.  The patient is currently on day 3 of vancomycin for sepsis vs r/o meningitis.   Temp (24hrs), Avg:98.2 F (36.8 C), Min:97.9 F (36.6 C), Max:98.5 F (36.9 C)   Recent Labs Lab 03/11/15 1510 03/11/15 2144 03/12/15 0243 03/12/15 0715 03/13/15 0330  WBC 16.8* 13.5* 17.0* 19.6* 15.8*    Recent Labs Lab 03/11/15 1510 03/12/15 0243 03/12/15 0715 03/13/15 0330 03/13/15 1200  CREATININE 1.14 0.90 1.21 1.17 0.95   Estimated Creatinine Clearance: 74.1 mL/min (by C-G formula based on Cr of 0.95).    No Known Allergies    Levels this admission: vanc trough 8 on vanc 750mg  Q12H    Assessment/Plan: Will change vancomycin to 750mg  IV [id number] for calculated trough ~16 and continue to monitor.  Thank you for allowing pharmacy to be a part of this patient's care.  Wynona Neat, PharmD, BCPS   03/14/2015 6:11 AM

## 2015-03-14 NOTE — Progress Notes (Signed)
[facility] Progress Note Patient Name: Benjamin Park DOB: 1941-03-01 MRN: [id number]   Date of Service  03/14/2015  HPI/Events of Note  Panda placed b y IR pulled out  eICU Interventions  Ordered IR guided panda     Intervention Category Major Interventions: Other:  [name] 03/14/2015, 2:35 AM

## 2015-03-14 NOTE — Progress Notes (Signed)
E-link called to switch labetalol PO to IV.  Talked with nurse and she is relaying message to MD when they get in at 1500.

## 2015-03-14 NOTE — Progress Notes (Signed)
eLink Physician-Brief Progress Note Patient Name: HASANI MENDELSON DOB: May 24, 1940 MRN: [id number]   Date of Service  03/14/2015  HPI/Events of Note  Hypertension Can't take oral meds Pulled out NG Was on labetalol per tube  eICU Interventions  Change oral labetalol to IV metoprolol     Intervention Category Intermediate Interventions: Hypertension - evaluation and management  Simonne Maffucci 03/14/2015, 3:08 PM

## 2015-03-14 NOTE — Progress Notes (Addendum)
ANTIBIOTIC CONSULT NOTE - INITIAL  Pharmacy Consult for vanc/CTX/acyclovir Indication: r/o meningitis  No Known Allergies  Patient Measurements: Height: 6' (182.9 cm) Weight: 166 lb 10.7 oz (75.6 kg) IBW/kg (Calculated) : 77.6   Vital Signs: Temp: 97.6 F (36.4 C) (12/25 0901) Temp Source: Oral (12/25 0901) BP: 177/109 mmHg (12/25 1018) Pulse Rate: 76 (12/25 0901) Intake/Output from previous day: 12/24 0701 - 12/25 0700 In: 2794.8 [I.V.:123.2; NG/GT:1019.6; IV Piggyback:1652] Out: 4025 [Urine:4025] Intake/Output from this shift: Total I/O In: 358 [I.V.:3; IV Piggyback:355] Out: 300 [Urine:300]  Labs:  Recent Labs  03/12/15 0715 03/13/15 0330 03/13/15 1200 03/14/15 0530  WBC 19.6* 15.8*  --  12.0*  HGB 13.7 12.6*  --  13.1  PLT 163 195  --  135*  CREATININE 1.21 1.17 0.95 0.86   Estimated Creatinine Clearance: 81.8 mL/min (by C-G formula based on Cr of 0.86).  Recent Labs  03/14/15 0530  [place] 8*     Microbiology: Recent Results (from the past 720 hour(s))  Blood culture (routine x 2)     Status: None (Preliminary result)   Collection Time: 03/11/15  3:00 PM  Result Value Ref Range Status   Specimen Description BLOOD RIGHT ANTECUBITAL  Final   Special Requests BOTTLES DRAWN AEROBIC AND ANAEROBIC 10CC  Final   Culture NO GROWTH 2 DAYS  Final   Report Status PENDING  Incomplete  Blood culture (routine x 2)     Status: None (Preliminary result)   Collection Time: 03/11/15  3:05 PM  Result Value Ref Range Status   Specimen Description BLOOD RIGHT ARM  Final   Special Requests IN PEDIATRIC BOTTLE 4CC  Final   Culture NO GROWTH 2 DAYS  Final   Report Status PENDING  Incomplete  Urine culture     Status: None   Collection Time: 03/11/15  3:32 PM  Result Value Ref Range Status   Specimen Description URINE, CATHETERIZED  Final   Special Requests NONE  Final   Culture NO GROWTH 2 DAYS  Final   Report Status 03/13/2015 FINAL  Final  MRSA PCR Screening      Status: None   Collection Time: 03/11/15  9:47 PM  Result Value Ref Range Status   MRSA by PCR NEGATIVE NEGATIVE Final    Comment:        The GeneXpert MRSA Assay (FDA approved for NASAL specimens only), is one component of a comprehensive MRSA colonization surveillance program. It is not intended to diagnose MRSA infection nor to guide or monitor treatment for MRSA infections.     Medical History: Past Medical History  Diagnosis Date  . Hx of transient ischemic attack (TIA)   . Hyperlipidemia   . ED (erectile dysfunction)   . Personal history of kidney stones   . Subarachnoid hemorrhage ([place])     '11- hospitalized -3 to 4 days(passed out-awoke in hospital)- no residual affects.  . Myocardial infarction ([place])     12/1991  . Dysphagia   . Hypokalemia   . Hypothyroidism   . Sleep apnea     on CPAP-settings 6  . History of kidney stones 02-27-13    x5- none recent  . Throat cancer [facility])     '00-radiation therapy  . Stroke ([place])   . Unspecified cerebral artery occlusion with cerebral infarction 11/08/2012  . Episodic confusion 02-27-13    hx. of, not currently(multiple episodes- ? TIA's)-always saw MD to be evaluated.  Marland Kitchen AKI (acute kidney injury) ([place]) 02/23/2011  pt. denies any problems 02-27-13  . Hypertension     hx. labile - with spikes every 3-6 mos.-last 8'14 ED visit.  Marland Kitchen Hypertensive encephalopathy   . Headache(784.0)     episodic with periods of confusion -multiple  . Neuromuscular disorder ([place])     "parkinson's" -no tremor-sees neurology MD at [facility]  . Coronary artery disease     Dr. [place]    Assessment: 3 yom with suspected sepsis. Pharmacy consulted to dose vancomycin/ceftriaxone/acyclovir for r/o meningitis + ampicillin per MD. Ampicillin was discontinued. Given 1x doses of Zosyn/Vanc 1g in ED. Is currently afebrile, WBC have trended down since admit (16.8>15.8>12). SCr improved (1.17>0.95>0.86), CrCl~81.  12/22 vanc>> 12/22 zosyn x 1  dose 12/22 CTX>> 12/22 acyclovir>> 12/22 amp >>12/25  12/22 BCx2>> 12/22 UC>>  VT: 8 (drew at 0530 appropriately before dose)  Goal of Therapy:  Vancomycin trough level 15-20 mcg/ml  Plan:  Vanc 750mg  IV Q8H CTX 2g IV Q12H Acyclovir 700mg  IV Q8H (~10mg /kg) Monitor clinical progress, c/s, renal function, abx plan/LOT VT@SS  as indicated   Melburn Popper, PharmD Clinical Pharmacy Resident Pager: [phone] 03/14/2015 11:14 AM

## 2015-03-15 ENCOUNTER — Inpatient Hospital Stay (HOSPITAL_COMMUNITY): Payer: Medicare HMO

## 2015-03-15 DIAGNOSIS — E785 Hyperlipidemia, unspecified: Secondary | ICD-10-CM

## 2015-03-15 DIAGNOSIS — E872 Acidosis: Secondary | ICD-10-CM

## 2015-03-15 DIAGNOSIS — R1310 Dysphagia, unspecified: Secondary | ICD-10-CM

## 2015-03-15 LAB — CBC
HCT: 40.6 % (ref 39.0–52.0)
HEMOGLOBIN: 13.4 g/dL (ref 13.0–17.0)
MCH: 33 pg (ref 26.0–34.0)
MCHC: 33 g/dL (ref 30.0–36.0)
MCV: 100 fL (ref 78.0–100.0)
Platelets: 166 10*3/uL (ref 150–400)
RBC: 4.06 MIL/uL — AB (ref 4.22–5.81)
RDW: 13.4 % (ref 11.5–15.5)
WBC: 9.7 10*3/uL (ref 4.0–10.5)

## 2015-03-15 LAB — BASIC METABOLIC PANEL
ANION GAP: 9 (ref 5–15)
ANION GAP: 9 (ref 5–15)
BUN: 12 mg/dL (ref 6–20)
BUN: 9 mg/dL (ref 6–20)
CALCIUM: 8.5 mg/dL — AB (ref 8.9–10.3)
CHLORIDE: 108 mmol/L (ref 101–111)
CO2: 27 mmol/L (ref 22–32)
CO2: 27 mmol/L (ref 22–32)
CREATININE: 0.9 mg/dL (ref 0.61–1.24)
Calcium: 8.6 mg/dL — ABNORMAL LOW (ref 8.9–10.3)
Chloride: 108 mmol/L (ref 101–111)
Creatinine, Ser: 0.98 mg/dL (ref 0.61–1.24)
GFR calc non Af Amer: >60 mL/min (ref >60)
Glucose, Bld: 105 mg/dL — ABNORMAL HIGH (ref 65–99)
Glucose, Bld: 95 mg/dL (ref 65–99)
POTASSIUM: 3.5 mmol/L (ref 3.5–5.1)
Potassium: 4.1 mmol/L (ref 3.5–5.1)
SODIUM: 144 mmol/L (ref 135–145)
SODIUM: 144 mmol/L (ref 135–145)

## 2015-03-15 LAB — LIPID PANEL
CHOLESTEROL: 162 mg/dL (ref 0–200)
HDL: 44 mg/dL (ref >40)
LDL Cholesterol: 96 mg/dL (ref 0–99)
TRIGLYCERIDES: 110 mg/dL (ref <150)
Total CHOL/HDL Ratio: 3.7 RATIO
VLDL: 22 mg/dL (ref 0–40)

## 2015-03-15 LAB — GLUCOSE, CAPILLARY
GLUCOSE-CAPILLARY: 95 mg/dL (ref 65–99)
GLUCOSE-CAPILLARY: 92 mg/dL (ref 65–99)
GLUCOSE-CAPILLARY: 96 mg/dL (ref 65–99)
Glucose-Capillary: 100 mg/dL — ABNORMAL HIGH (ref 65–99)
Glucose-Capillary: 111 mg/dL — ABNORMAL HIGH (ref 65–99)

## 2015-03-15 LAB — PHOSPHORUS: PHOSPHORUS: 2.7 mg/dL (ref 2.5–4.6)

## 2015-03-15 LAB — PROCALCITONIN: Procalcitonin: 0.12 ng/mL

## 2015-03-15 LAB — MAGNESIUM: MAGNESIUM: 2.3 mg/dL (ref 1.7–2.4)

## 2015-03-15 MED ORDER — DEXTROSE 5 % IV SOLN
1.0000 g | INTRAVENOUS | Status: AC
  Administered 2015-03-15: 1 g via INTRAVENOUS
  Filled 2015-03-15: qty 10

## 2015-03-15 MED ORDER — STARCH (THICKENING) PO POWD
ORAL | Status: DC | PRN
  Filled 2015-03-15: qty 227

## 2015-03-15 MED ORDER — BENZONATATE 100 MG PO CAPS: 200.0000 mg | ORAL_CAPSULE | Freq: Three times a day (TID) | ORAL | Status: DC | PRN

## 2015-03-15 MED ORDER — LEVOTHYROXINE SODIUM 112 MCG PO TABS
112.0000 ug | ORAL_TABLET | Freq: Every day | ORAL | Status: DC
  Administered 2015-03-16 – 2015-03-18 (×3): 112 ug via ORAL
  Filled 2015-03-15 (×3): qty 1

## 2015-03-15 MED ORDER — CEFTRIAXONE SODIUM 1 G IJ SOLR
1.0000 g | Freq: Two times a day (BID) | INTRAMUSCULAR | Status: DC
  Filled 2015-03-15: qty 10

## 2015-03-15 MED ORDER — ATORVASTATIN CALCIUM 20 MG PO TABS
20.0000 mg | ORAL_TABLET | Freq: Every day | ORAL | Status: DC
  Administered 2015-03-15 – 2015-03-17 (×2): 20 mg via ORAL
  Filled 2015-03-15 (×3): qty 1

## 2015-03-15 MED ORDER — CLOPIDOGREL BISULFATE 75 MG PO TABS
75.0000 mg | ORAL_TABLET | Freq: Every day | ORAL | Status: DC
  Administered 2015-03-16 – 2015-03-18 (×3): 75 mg via ORAL
  Filled 2015-03-15 (×3): qty 1

## 2015-03-15 MED ORDER — LABETALOL HCL 200 MG PO TABS
200.0000 mg | ORAL_TABLET | Freq: Two times a day (BID) | ORAL | Status: DC
  Administered 2015-03-15 – 2015-03-18 (×6): 200 mg via ORAL
  Filled 2015-03-15 (×6): qty 1

## 2015-03-15 MED ORDER — RESOURCE THICKENUP CLEAR PO POWD
ORAL | Status: DC | PRN
  Filled 2015-03-15: qty 125

## 2015-03-15 NOTE — Progress Notes (Signed)
STROKE TEAM PROGRESS NOTE   HISTORY Benjamin Park is a 74 y.o. male hx of TIA/stroke, HLD, SAH, Parkinson's admitted for 2 days of increasing confusion. At baseline family reports he is high functioning and able to perform all ADLs. 2 days ago they note he became increasingly confused. On day of admission family found him unconscious in his own urine. Admission labs pertinent for WBC 16.8, BUN 23, lactic acid 2.61. Was intubated for airway protection.   Family notes multiple episodes in the past where his blood pressure is elevated and he becomes confused. EMS reports BP of 170/100 upon their arrival. Family notes that his mental status appears to be improving compared to this morning.    SUBJECTIVE (INTERVAL HISTORY) No family is at the bedside.  Overall his condition is stable. Moving all extremities, only with mild dysarthria. Afebrile and mental status much improved.    OBJECTIVE Temp:  [97.8 F (36.6 C)-98.3 F (36.8 C)] 98 F (36.7 C) (12/26 1142) Pulse Rate:  [77-101] 99 (12/26 1142) Cardiac Rhythm:  [-] Normal sinus rhythm (12/26 0800) Resp:  [15-21] 17 (12/26 1142) BP: (110-178)/(77-111) 171/110 mmHg (12/26 1142) SpO2:  [96 %-100 %] 100 % (12/26 1142)  CBC:  Recent Labs Lab 03/11/15 2144 03/12/15 0243  03/14/15 0530 03/15/15 0435  WBC 13.5* 17.0*  < > 12.0* 9.7  NEUTROABS 12.1* 15.2*  --   --   --   HGB 16.4 15.1  < > 13.1 13.4  HCT 48.0 43.4  < > 37.6* 40.6  MCV 96.0 95.4  < > 96.9 100.0  PLT 169 190  < > 135* 166  < > = values in this interval not displayed.  Basic Metabolic Panel:  Recent Labs Lab 03/14/15 0530 03/14/15 2350 03/15/15 0435  NA 141 144 144  K 2.8* 4.1 3.5  CL 106 108 108  CO2 27 27 27   GLUCOSE 168* 105* 95  BUN 9 9 12   CREATININE 0.86 0.90 0.98  CALCIUM 8.0* 8.5* 8.6*  MG 2.2  --  2.3  PHOS 2.1*  --  2.7    Lipid Panel:    Component Value Date/Time   CHOL 162 03/15/2015 0435   TRIG 110 03/15/2015 0435   HDL 44 03/15/2015 0435   CHOLHDL 3.7 03/15/2015 0435   VLDL 22 03/15/2015 0435   LDLCALC 96 03/15/2015 0435   HgbA1c:  Lab Results  Component Value Date   HGBA1C 5.4 02/01/2014   Urine Drug Screen:    Component Value Date/Time   LABOPIA NONE DETECTED 03/12/2015 0253   LABOPIA NEGATIVE 02/24/2011 1007   COCAINSCRNUR NONE DETECTED 03/12/2015 0253   COCAINSCRNUR NEGATIVE 02/24/2011 1007   LABBENZ NONE DETECTED 03/12/2015 0253   LABBENZ NEGATIVE 02/24/2011 1007   AMPHETMU NONE DETECTED 03/12/2015 0253   AMPHETMU NEGATIVE 02/24/2011 1007   THCU NONE DETECTED 03/12/2015 0253   LABBARB NONE DETECTED 03/12/2015 0253     IMAGING I have personally reviewed the radiological images below and agree with the radiology interpretations.  Dg Abd 1 View 03/12/2015   Nasogastric tube tip and side port in stomach.   Ct Head Wo Contrast 03/11/2015  No acute intracranial abnormality. Stable cerebral and cerebellar atrophy. Diffuse white matter changes likely related to chronic small vessel ischemic disease.   Mr Brain Wo Contrast 03/13/2015   1. Two small adjacent cortical ischemic infarcts measuring up to 8 mm within the high right parietal lobe near the vertex. No associated hemorrhage or significant mass effect. 2. Otherwise  unchanged advanced cerebral atrophy with chronic small vessel ischemic disease. Chronic signal changes in corpus callosum, middle cerebellar peduncles, and cerebral white matter are otherwise stable.   Dg Chest Port 1 View 03/13/2015  Endotracheal tube is low, recommend withdrawal 2 cm. Progression of bibasilar atelectasis.  03/12/2015  Right IJ central line with tip over central SVC.  No pneumothorax.  03/12/2015   Tip of endotracheal tube at carina directed into RIGHT mainstem bronchus, recommend withdrawal 2.5 cm. Atelectasis versus consolidation LEFT lower lobe with minimal RIGHT basilar atelectasis.   Dg Chest Portable 1 View 03/11/2015   No acute chest findings.   EEG - 03/12/2015 -  moderate diffuse slowing of the background c/w nonspecific diffuse cerebral dysfunction  2D Echo EF 65-70%. No cardiac source of emboli identified.  Carotid Doppler   Technically limited on the right due to central line placement and dressing. Appears to have a 1% to 39% ICA stenosis bilaterally, Right vertebral,distal CCA, and bulb could not be visualized due to the above mentioned limitation.    PHYSICAL EXAM Constitutional: He appears well-developed and well-nourished.  Psych: Affect appropriate to situation Eyes: No scleral injection HENT: No OP obstrucion Head: Normocephalic.  Cardiovascular: Normal rate and regular rhythm.  Respiratory: Effort normal and breath sounds normal.  GI: Soft. Bowel sounds are normal. No distension. There is no tenderness.  Skin: WDI  Neurologic Examination Mental Status: Awake alert, following commands and answering questions. AAO x 3 but impaired on fund of knowledge. Intact naming and repetition, but mild dysarthria.  Cranial Nerves: II: optic discs not visualized, blinks to threat bilaterall, pupils equal, round, reactive to light  III,IV, VI: ptosis not present, extra-ocular motions intact bilaterally, no gaze deviation V,VII: face symmetric,  VIII: hearing normal bilaterally IX,X: gag reflex present, mild dysarthria. XI: unable to test XII: unable to test Motor: Unable to formally test, withdrawals to noxious stimuli in all etremities Sensory: see above Deep Tendon Reflexes: 2+ and symmetric throughout Plantars: Right: downgoingLeft: downgoing Cerebellar: slow but no ataxia. Gait: deferred due to safety concerns.    ASSESSMENT/PLAN [name] is a 74 y.o. male with history of a previous TIA/stroke, hypertensive encephalopathy, CAD, hyperlipidemia, subarachnoid hemorrhage, throat cancer s/p radiation and Parkinson's disease, presenting with a 2 day history of confusion, elevated blood  pressure and elevated WBC count. He did not receive IV t-PA due to late presentation.  Stroke:  Two right high parietal cortical infarcts, embolic vs atherosclerosis   MRI  2 high R parietal lobe cortical infarcts  Carotid Doppler  No significant stenosis   2D Echo EF 65-70%. No cardiac source of emboli identified.  EEG - moderate diffuse slowing of the background c/w nonspecific diffuse cerebral dysfunction  LDL 96  HgbA1c pending  Heparin 5000 units sq tid for VTE prophylaxis  DIET - DYS 1 Room service appropriate?: Yes; Fluid consistency:: Pudding Thick  aspirin 325 mg daily prior to admission, now on plavix for stroke prevention. Continue plavix on discharge.   Patient counseled to be compliant with his antithrombotic medications  Recommend outpt 30 day cardiac event monitoring to rule out afib  Blood culture and TTE negative, pt clinically much improved, hold off on TEE.  Ongoing aggressive stroke risk factor management  Therapy recommendations:  pending   Disposition:  pending   Fever  On rocephin  Not consistent with CNS infection  Off vanco and acyclovir, ampicillin.  WBC trending down and normalized.  Could be due to aspiration.   Hypertensive  encephalopathy  BP elevated  BP goal < 180 and gradually normalize to 120-140  Multiple hypertensive encephalopathy admissions  Hyperlipidemia  Home meds:  No statin listed  LDL 96, goal < 70  Added statin, lipitor 20  Continue statin at discharge  Hx Seizures  Treated with Keppra during previous hospitalization for seizure  Other Stroke Risk Factors  Advanced age  Former Cigarette smoker, quit smoking 23 years ago   Hx stroke/TIA  coronary artery disease - MI  obstructive sleep apnea   PFO closure 2008  Other Active Problems  Parkinson's Diease - no tremor - sees MD at [facility]  Hx throat cancer s/p radiation  Hospital day # 4  Neurology will sign off. Please call with questions. Pt  will follow up with NP Ms. Hassell Done at [facility] in about 1 month. Thanks for the consult.  Rosalin Hawking, MD PhD Stroke Neurology 03/15/2015 9:31 PM     To contact Stroke Continuity provider, please refer to [email or web address] After hours, contact General Neurology

## 2015-03-15 NOTE — Progress Notes (Addendum)
Speech Language Pathology   Patient Details Name: Benjamin Park MRN: [id number] DOB: [birth year] Today's Date: 03/15/2015 Time:  -     MBS completed today. See progress note for full details. Recommend puree and pudding thick liquids (nothing thinner than puree) following MBS. Per RN, pt's wife reported he is supposed to use thickener but doesn't; home alone most of day (?). She reported to RN pt coughs every time he drinks liquid. Pt has had longstanding dysphagia with mulitple MBS. According to [facility] documentation multiple chest xrays over past 3-4 years have not revealed pna (once with questionable infiltrates).  Possibly he has been able to clear penetrates with cough (?) Briefly discussed with Dr. Thereasa Solo allowing nectar or honey thick liquids for hydration with chronic dysphagia. Will follow up next day.   [name] Benjamin.Ed [employer] [phone]

## 2015-03-15 NOTE — Progress Notes (Signed)
ANTIBIOTIC CONSULT NOTE - INITIAL  Pharmacy Consult for narrow to Ceftriaxone alone Indication: r/o sepsis  No Known Allergies  Patient Measurements: Height: 6' (182.9 cm) Weight: 166 lb 10.7 oz (75.6 kg) IBW/kg (Calculated) : 77.6   Vital Signs: Temp: 98 F (36.7 C) (12/26 1142) Temp Source: Oral (12/26 1142) BP: 171/110 mmHg (12/26 1142) Pulse Rate: 99 (12/26 1142) Intake/Output from previous day: 12/25 0701 - 12/26 0700 In: 758 [I.V.:3; IV Piggyback:755] Out: 1475 [Urine:1475] Intake/Output from this shift: Total I/O In: 50 [IV Piggyback:50] Out: 120 [Urine:120]  Labs:  Recent Labs  03/13/15 0330  03/14/15 0530 03/14/15 2350 03/15/15 0435  WBC 15.8*  --  12.0*  --  9.7  HGB 12.6*  --  13.1  --  13.4  PLT 195  --  135*  --  166  CREATININE 1.17  < > 0.86 0.90 0.98  < > = values in this interval not displayed. Estimated Creatinine Clearance: 71.8 mL/min (by C-G formula based on Cr of 0.98).  Recent Labs  03/14/15 0530  [place] 8*     Microbiology: Recent Results (from the past 720 hour(s))  Blood culture (routine x 2)     Status: None (Preliminary result)   Collection Time: 03/11/15  3:00 PM  Result Value Ref Range Status   Specimen Description BLOOD RIGHT ANTECUBITAL  Final   Special Requests BOTTLES DRAWN AEROBIC AND ANAEROBIC 10CC  Final   Culture NO GROWTH 4 DAYS  Final   Report Status PENDING  Incomplete  Blood culture (routine x 2)     Status: None (Preliminary result)   Collection Time: 03/11/15  3:05 PM  Result Value Ref Range Status   Specimen Description BLOOD RIGHT ARM  Final   Special Requests IN PEDIATRIC BOTTLE 4CC  Final   Culture NO GROWTH 4 DAYS  Final   Report Status PENDING  Incomplete  Urine culture     Status: None   Collection Time: 03/11/15  3:32 PM  Result Value Ref Range Status   Specimen Description URINE, CATHETERIZED  Final   Special Requests NONE  Final   Culture NO GROWTH 2 DAYS  Final   Report Status 03/13/2015  FINAL  Final  MRSA PCR Screening     Status: None   Collection Time: 03/11/15  9:47 PM  Result Value Ref Range Status   MRSA by PCR NEGATIVE NEGATIVE Final    Comment:        The GeneXpert MRSA Assay (FDA approved for NASAL specimens only), is one component of a comprehensive MRSA colonization surveillance program. It is not intended to diagnose MRSA infection nor to guide or monitor treatment for MRSA infections.     Medical History: Past Medical History  Diagnosis Date  . Hx of transient ischemic attack (TIA)   . Hyperlipidemia   . ED (erectile dysfunction)   . Personal history of kidney stones   . Subarachnoid hemorrhage ([place])     '11- hospitalized -3 to 4 days(passed out-awoke in hospital)- no residual affects.  . Myocardial infarction ([place])     12/1991  . Dysphagia   . Hypokalemia   . Hypothyroidism   . Sleep apnea     on CPAP-settings 6  . History of kidney stones 02-27-13    x5- none recent  . Throat cancer [facility])     '00-radiation therapy  . Stroke ([place])   . Unspecified cerebral artery occlusion with cerebral infarction 11/08/2012  . Episodic confusion 02-27-13    hx.  of, not currently(multiple episodes- ? TIA's)-always saw MD to be evaluated.  Marland Kitchen AKI (acute kidney injury) ([place]) 02/23/2011    pt. denies any problems 02-27-13  . Hypertension     hx. labile - with spikes every 3-6 mos.-last 8'14 ED visit.  Marland Kitchen Hypertensive encephalopathy   . Headache(784.0)     episodic with periods of confusion -multiple  . Neuromuscular disorder ([place])     "parkinson's" -no tremor-sees neurology MD at [facility]  . Coronary artery disease     Dr. [place]    Assessment: 29 yom with suspected sepsis. Pharmacy consulted to dose vancomycin/ceftriaxone/acyclovir for r/o meningitis + ampicillin per MD. Ampicillin was discontinued. Given 1x doses of Zosyn/Vanc 1g in ED. Is currently afebrile, WBC have trended down since admit. SCr improved.  Per CCM note, meningitis ruled out,  narrowing antibiotics to ceftriaxone.  12/22 vanc>>12/25 12/22 zosyn x 1 dose 12/22 CTX>> 12/22 acyclovir>>12/25 12/22 amp >>12/25  12/22 BCx2>>ngtd 12/22 UC>>ngtd  VT: 8 (drew at 0530 appropriately before dose)  Goal of Therapy:  Vancomycin trough level 15-20 mcg/ml  Plan:  Decrease ceftriaxone to 1g q 24 hrs. F/u LOT  Uvaldo Rising, BCPS  Clinical Pharmacist Pager [phone]  03/15/2015 1:22 PM

## 2015-03-15 NOTE — Evaluation (Signed)
Physical Therapy Evaluation Patient Details Name: Benjamin Park MRN: [id number] DOB: 19-Aug-1940 Today's Date: 03/15/2015   History of Present Illness  Benjamin Park is a 74 y.o. male hx of TIA/stroke, HLD, SAH, Parkinson's admitted for 2 days of increasing confusion. At baseline family reports he is high functioning and able to perform all ADLs. 2 days ago they note he became increasingly confused. On day of admission family found him unconscious in his own urine. Admission labs pertinent for WBC 16.8, BUN 23, lactic acid 2.61. Was intubated for airway protection.   Clinical Impression  Pt admitted with above diagnosis. Pt currently with functional limitations due to the deficits listed below (see PT Problem List). Pt will need SNF at d/c as he is confused and unsafe even with RW.  Cannot stay alone at this time for safety issues therefore will benefit from SNF for rehab.  Will follow acutely.  Pt will benefit from skilled PT to increase their independence and safety with mobility to allow discharge to the venue listed below.     Follow Up Recommendations SNF;Supervision/Assistance - 24 hour    Equipment Recommendations  Other (comment) (TBA)    Recommendations for Other Services       Precautions / Restrictions Precautions Precautions: Fall Restrictions Weight Bearing Restrictions: No      Mobility  Bed Mobility Overal bed mobility: Needs Assistance Bed Mobility: Supine to Sit     Supine to sit: Min guard     General bed mobility comments: Assist for elevation of trunk.    Transfers Overall transfer level: Needs assistance Equipment used: Rolling walker (2 wheeled) Transfers: Sit to/from Stand Sit to Stand: Min assist         General transfer comment: Needs steadying assist in standing position with RW.    Ambulation/Gait Ambulation/Gait assistance: Min assist;Mod assist Ambulation Distance (Feet): 200 Feet Assistive device: Rolling walker (2 wheeled) Gait  Pattern/deviations: Step-to pattern;Decreased stride length;Scissoring;Ataxic;Narrow base of support;Trunk flexed;Staggering left;Staggering right   Gait velocity interpretation: Below normal speed for age/gender General Gait Details: Pt with poor safety awareness with RW.  Pt ambulating and reached up to scratch head but kept pushing RW with other hand and lost balance needing mod assist to recover.  Pt generally unsteady due to scissor gait. Needed assist and cues for safety with Rw.    Stairs            Wheelchair Mobility    Modified Rankin (Stroke Patients Only)       Balance Overall balance assessment: Needs assistance;History of Falls Sitting-balance support: No upper extremity supported;Feet supported Sitting balance-Leahy Scale: Fair     Standing balance support: Bilateral upper extremity supported;During functional activity Standing balance-Leahy Scale: Poor Standing balance comment: pt relies on UEs for balance.              High level balance activites: Direction changes;Turns High Level Balance Comments: Pt needed min to mod assist with challenges to balance              Pertinent Vitals/Pain Pain Assessment: No/denies pain  BP high and nursing notified - 171/112    Home Living Family/patient expects to be discharged to:: Private residence Living Arrangements: Spouse/significant other Available Help at Discharge: Family;Available PRN/intermittently Type of Home: House Home Access: Stairs to enter Entrance Stairs-Rails: Right Entrance Stairs-Number of Steps: 5 Home Layout: One level;Laundry or work area in [place]: Environmental consultant - 2 wheels;Cane - single point;Shower seat Additional Comments: pt's wife works  as CNA at [employer] 2nd shift as well as 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 Pt does not drive.    Prior Function Level of Independence: Independent with assistive device(s)         Comments: walks with cane,  frequent falls. Mows grass on his own     Hand Dominance   Dominant Hand: Right    Extremity/Trunk Assessment   Upper Extremity Assessment: Defer to OT evaluation           Lower Extremity Assessment: Generalized weakness      Cervical / Trunk Assessment: Kyphotic  Communication   Communication: HOH  Cognition Arousal/Alertness: Awake/alert Behavior During Therapy: Flat affect Overall Cognitive Status: Impaired/Different from baseline Area of Impairment: Safety/judgement;Problem solving         Safety/Judgement: Decreased awareness of safety   Problem Solving: Slow processing      General Comments      Exercises General Exercises - Lower Extremity Ankle Circles/Pumps: AROM;Both;10 reps;Seated Long Arc Quad: AROM;Both;10 reps;Seated      Assessment/Plan    PT Assessment Patient needs continued PT services  PT Diagnosis Generalized weakness   PT Problem List Decreased mobility;Decreased balance;Decreased activity tolerance;Decreased knowledge of use of DME;Decreased safety awareness;Decreased knowledge of precautions;Decreased strength;Decreased coordination;Decreased cognition  PT Treatment Interventions DME instruction;Gait training;Functional mobility training;Therapeutic activities;Therapeutic exercise;Balance training;Patient/family education   PT Goals (Current goals can be found in the Care Plan section) Acute Rehab PT Goals Patient Stated Goal: unable to state PT Goal Formulation: Patient unable to participate in goal setting Time For Goal Achievement: 03/29/15 Potential to Achieve Goals: Good    Frequency Min 3X/week   Barriers to discharge Decreased caregiver support (wife works)      Building control surveyor of Session Equipment Utilized During Treatment: Gait belt Activity Tolerance: Patient limited by fatigue Patient left: in chair;with call bell/phone within reach;with chair alarm set Nurse Communication: Mobility  status         Time: 1210-1225 PT Time Calculation (min) (ACUTE ONLY): 15 min   Charges:   PT Evaluation $Initial PT Evaluation Tier I: 1 Procedure     PT G CodesDenice Paradise 04/05/15, 4:47 PM Paelyn Smick [facility] Acute Rehabilitation [phone] (pager)

## 2015-03-15 NOTE — Care Management Important Message (Signed)
Important Message  Patient Details  Name: Benjamin Park MRN: [id number] Date of Birth: [birth year]   Medicare Important Message Given:  Yes    Lacretia Leigh, RN 03/15/2015, 12:12 PM

## 2015-03-15 NOTE — Progress Notes (Signed)
[facility] TEAM 1 - Stepdown/ICU TEAM PROGRESS NOTE  Benjamin Park [id number] DOB: October 30, 1940 DOA: 03/11/2015 PCP: No primary care provider on file.  Admit HPI / Brief Narrative: 74 year old male w/ a hx of uncontrolled hypertension, hypertensive encephalopathy, TIA, subarachnoid hemorrhage, MI, hypothyroidism, obstructive sleep apnea, and chronic aspiration which has been well-documented on prior swallowing evaluations (refuses to use a thickening solution) who presented to [facility] ED 12/22 complaining of a 2 day history of altered mental status. His family found him on the floor in his own urine. In the emergency department he was profoundly hypertensive.  Significant Events: 12/23 > admit for acute encephalopathy  HPI/Subjective: Pt is resting in a bedside chair.  He c/o frequent hacking cough, but denies cp, sob, n/v, abdom pain, or HA.    Assessment/Plan:  Hypertensive crisis w/ encephalopathy BP now well controlled - mental status appears to be slowly improving   SIRS (fever, leukocytosis, tachycardia) no obvious source of infection - PCCM was treating empirically for meningitis - will complete 5 days of abx and then follow w/o    Two small adjacent cortical ischemic infarcts measuring up to 23mm within the high right parietal lobe near the vertex Care as per Neurology - embolic v/s atherosclerotic - no signif stenosis on carotid dopplers - no source of emboli on TTE - Plavix to be started per Neuro   Chronic aspiration See notes per SLP - give trial of modified diet and follow for tolerance   Acute renal failure Resolved - crt is normal   Parkinson's disease Resume usual home med tx   Hx of Seizures   Hypothyroidism Cont synthroid   Hyperglycemia without history of DM Resolved   OSA uncertain if prescribed CPAP but is generally non-compliant  H/o CAD, MI  Code Status: FULL Family Communication: no family present at time of exam Disposition Plan: SDU    Consultants: PCCM Neurology   Antibiotics: Ampicillin 12/22 > 12/25 Ceftriaxone 12/22 > Pip/tazo 12/22 > 12/23 Vancomycin 12/22 > 12/25 Acyclovir 12/22 > 12/25  DVT prophylaxis: SQ heparin   Objective: Blood pressure 139/99, pulse 80, temperature 98 F (36.7 C), temperature source Oral, resp. rate 15, height 6' (1.829 m), weight 75.6 kg (166 lb 10.7 oz), SpO2 100 %.  Intake/Output Summary (Last 24 hours) at 03/15/15 1641 Last data filed at 03/15/15 [id number]  Gross per 24 hour  Intake    250 ml  Output    870 ml  Net   -620 ml   Exam: General: No acute respiratory distress Lungs: Clear to auscultation bilaterally without wheezes or crackles Cardiovascular: Regular rate and rhythm without murmur gallop or rub normal S1 and S2 Abdomen: Nontender, nondistended, soft, bowel sounds positive, no rebound, no ascites, no appreciable mass Extremities: No significant cyanosis, clubbing, or edema bilateral lower extremities  Data Reviewed: Basic Metabolic Panel:  Recent Labs Lab 03/13/15 0330 03/13/15 1200 03/14/15 0530 03/14/15 2350 03/15/15 0435  NA 138 143 141 144 144  K 2.9* 3.3* 2.8* 4.1 3.5  CL 108 109 106 108 108  CO2 21* 25 27 27 27   GLUCOSE 154* 94 168* 105* 95  BUN 17 12 9 9 12   CREATININE 1.17 0.95 0.86 0.90 0.98  CALCIUM 7.6* 8.0* 8.0* 8.5* 8.6*  MG 1.6* 2.3 2.2  --  2.3  PHOS <1.0* 1.7* 2.1*  --  2.7    CBC:  Recent Labs Lab 03/11/15 1510 03/11/15 2144 03/12/15 0243 03/12/15 0715 03/13/15 0330 03/14/15 0530 03/15/15 0435  WBC 16.8* 13.5* 17.0* 19.6* 15.8* 12.0* 9.7  NEUTROABS 15.1* 12.1* 15.2*  --   --   --   --   HGB 18.0* 16.4 15.1 13.7 12.6* 13.1 13.4  HCT 50.3 48.0 43.4 40.0 35.3* 37.6* 40.6  MCV 95.6 96.0 95.4 98.3 95.1 96.9 100.0  PLT 228 169 190 163 195 135* 166    Liver Function Tests:  Recent Labs Lab 03/11/15 1510  AST 43*  ALT 23  ALKPHOS 90  BILITOT 1.1  PROT 8.0  ALBUMIN 4.3    Coags:  Recent Labs Lab  03/11/15 1510  INR 1.07    Recent Labs Lab 03/11/15 2144  APTT 35    Cardiac Enzymes:  Recent Labs Lab 03/12/15 0243 03/12/15 2014  TROPONINI 0.14* 0.07*    CBG:  Recent Labs Lab 03/14/15 2305 03/15/15 0435 03/15/15 0725 03/15/15 1141 03/15/15 1614  GLUCAP 100* 95 92 96 111*    Recent Results (from the past 240 hour(s))  Blood culture (routine x 2)     Status: None (Preliminary result)   Collection Time: 03/11/15  3:00 PM  Result Value Ref Range Status   Specimen Description BLOOD RIGHT ANTECUBITAL  Final   Special Requests BOTTLES DRAWN AEROBIC AND ANAEROBIC 10CC  Final   Culture NO GROWTH 4 DAYS  Final   Report Status PENDING  Incomplete  Blood culture (routine x 2)     Status: None (Preliminary result)   Collection Time: 03/11/15  3:05 PM  Result Value Ref Range Status   Specimen Description BLOOD RIGHT ARM  Final   Special Requests IN PEDIATRIC BOTTLE 4CC  Final   Culture NO GROWTH 4 DAYS  Final   Report Status PENDING  Incomplete  Urine culture     Status: None   Collection Time: 03/11/15  3:32 PM  Result Value Ref Range Status   Specimen Description URINE, CATHETERIZED  Final   Special Requests NONE  Final   Culture NO GROWTH 2 DAYS  Final   Report Status 03/13/2015 FINAL  Final  MRSA PCR Screening     Status: None   Collection Time: 03/11/15  9:47 PM  Result Value Ref Range Status   MRSA by PCR NEGATIVE NEGATIVE Final    Comment:        The GeneXpert MRSA Assay (FDA approved for NASAL specimens only), is one component of a comprehensive MRSA colonization surveillance program. It is not intended to diagnose MRSA infection nor to guide or monitor treatment for MRSA infections.      Studies:   Recent x-ray studies have been reviewed in detail by the Attending Physician  Scheduled Meds:  Scheduled Meds: . antiseptic oral rinse  7 mL Mouth Rinse BID  . atorvastatin  20 mg Oral q1800  . cefTRIAXone (ROCEPHIN)  IV  1 g Intravenous Q24H   . [START ON 03/16/2015] clopidogrel  75 mg Oral Daily  . heparin  5,000 Units Subcutaneous 3 times per day  . insulin aspart  2-6 Units Subcutaneous 6 times per day  . levothyroxine  56 mcg Intravenous Daily  . metoprolol  5 mg Intravenous 4 times per day  . pantoprazole (PROTONIX) IV  40 mg Intravenous Q24H  . potassium chloride  10 mEq Intravenous q morning - 10a  . potassium chloride  20 mEq Per Tube BID  . sodium chloride  3 mL Intravenous Q12H    Time spent on care of this patient: 35 mins   Michell Giuliano T , MD  [facility] Office  [phone] Pager - Text Page per Shea Evans as per below:  On-Call/Text Page:      [email or web address]      password [email or web address]  If 7PM-7AM, please contact night-coverage [email or web address] Password [email or web address] 03/15/2015, 4:41 PM   LOS: 4 days

## 2015-03-15 NOTE — Progress Notes (Signed)
Patients wife concerned that patients Diastolic BP not less than 90 mmHg. Will address with day-shift RN and Attending MD

## 2015-03-15 NOTE — Progress Notes (Signed)
Speech Language Pathology  Patient Details Name: MYKOL GOYTIA MRN: [id number] DOB: 15-May-1940 Today's Date: 03/15/2015 Time: [id number] SLP Time Calculation (min) (ACUTE ONLY): 18 min  Please see full report in imaging section. Click on DG swallow function.   Recommendations:  Oral phase characterized by mildly delayed manipulation and transit. Moderate-severe sensorimotor pharyngeal dysphagia revealing aspiration during the swallow with honey thick (3-5 second reflexive cough) and penetration with head in chin tuck position. Mod-max vallecular residue following puree from reduced tongue base retraction. Scan of cervical esophagus showed mild stasis (? cricopharyngeal dysfunction/prominent cricopharyngeus). Cued additional swallows assisted to decrease residue although difficulty initiating "dry" swallow. Recommend Dys1, pudding thick liquids/nothing thinner than puree), crush pills and continued ST. Pt is at chronic risk for aspiration even with these recommendations.    [name] [place].Ed [employer] [phone]

## 2015-03-16 DIAGNOSIS — E785 Hyperlipidemia, unspecified: Secondary | ICD-10-CM | POA: Insufficient documentation

## 2015-03-16 DIAGNOSIS — I63321 Cerebral infarction due to thrombosis of right anterior cerebral artery: Secondary | ICD-10-CM | POA: Insufficient documentation

## 2015-03-16 LAB — CULTURE, BLOOD (ROUTINE X 2)
CULTURE: NO GROWTH
Culture: NO GROWTH

## 2015-03-16 LAB — HEMOGLOBIN A1C
HEMOGLOBIN A1C: 5.5 % (ref 4.8–5.6)
Mean Plasma Glucose: 111 mg/dL

## 2015-03-16 MED ORDER — SODIUM CHLORIDE 0.9 % IV SOLN
INTRAVENOUS | Status: DC
  Administered 2015-03-16 – 2015-03-17 (×2): via INTRAVENOUS

## 2015-03-16 MED ORDER — ALPRAZOLAM 0.25 MG PO TABS: 0.2500 mg | ORAL_TABLET | Freq: Two times a day (BID) | ORAL | Status: DC | PRN

## 2015-03-16 NOTE — Progress Notes (Signed)
NURSING PROGRESS NOTE  TYAIR LOHMEYER [id number] Admission Data: 03/16/2015 4:36 PM Attending Provider: Cherene Altes, MD PCP:No primary care provider on file. Code Status: FULL   Benjamin Park is a 74 y.o. male patient admitted from ED:  -No acute distress noted.  -No complaints of shortness of breath.  -No complaints of chest pain.   Cardiac Monitoring: none  Blood pressure 116/81, pulse 79, temperature 97.7 F (36.5 C), temperature source Oral, resp. rate 21, height 6' (1.829 m), weight 75.6 kg (166 lb 10.7 oz), SpO2 100 %.   IV Fluids:  IV in place. IV leaking; removed. New IV placed in right forearm.  Allergies:  Review of patient's allergies indicates no known allergies.  Past Medical History:   has a past medical history of transient ischemic attack (TIA); Hyperlipidemia; ED (erectile dysfunction); Personal history of kidney stones; Subarachnoid hemorrhage ([place]); Myocardial infarction ([place]); Dysphagia; Hypokalemia; Hypothyroidism; Sleep apnea; History of kidney stones (02-27-13); Throat cancer ([place]); Stroke [facility]); Unspecified cerebral artery occlusion with cerebral infarction (11/08/2012); Episodic confusion (02-27-13); AKI (acute kidney injury) ([place]) (02/23/2011); Hypertension; Hypertensive encephalopathy; Headache(784.0); Neuromuscular disorder ([place]); and Coronary artery disease.  Past Surgical History:   has past surgical history that includes Angioplasty (1993); Cardiac catheterization; Coronary angioplasty; basal cell cancer; Hernia repair; Appendectomy; Patent foramen ovale closure (2008); Eye surgery; Colonoscopy (N/A, 06/18/2012); Hot hemostasis (N/A, 06/18/2012); and Colonoscopy with propofol (N/A, 03/07/2013).  Social History:   reports that he quit smoking about 24 years ago. His smoking use included Cigarettes. He has never used smokeless tobacco. He reports that he does not drink alcohol or use illicit drugs.  Skin: Intact  Patient/Family orientated to room.  Information packet given to patient/family. Admission inpatient armband information verified with patient/family to include name and date of birth and placed on patient arm. Side rails up x 2, fall assessment and education completed with patient/family. Patient/family able to verbalize understanding of risk associated with falls and verbalized understanding to call for assistance before getting out of bed. Call light within reach. Patient/family able to voice and demonstrate understanding of unit orientation instructions.    Will continue to evaluate and treat per MD orders.  Charolette Child, RN

## 2015-03-16 NOTE — Progress Notes (Signed)
[place] TEAM 1 - Stepdown/ICU TEAM PROGRESS NOTE  Benjamin Park [id number] DOB: 09-30-1940 DOA: 03/11/2015 PCP: No primary care provider on file.  Admit HPI / Brief Narrative: 74 year old male w/ a hx of uncontrolled hypertension, hypertensive encephalopathy, TIA, subarachnoid hemorrhage, MI, hypothyroidism, obstructive sleep apnea, and chronic aspiration which has been well-documented on prior swallowing evaluations (refuses to use a thickening solution) who presented to [facility] ED 12/22 complaining of a 2 day history of altered mental status. His family found him on the floor in his own urine. In the emergency department he was profoundly hypertensive.  Significant Events: 12/23 > admit for acute encephalopathy  HPI/Subjective: Pt is laying in bed.  He has no complaints.  He denies cp, sob, n/v, or abdom pain.  He is able to tell me where he is, and the date, but his answers are slow are require a good bit of processing.    I spoke w/ his wife via phone at length.  She tells me he would not stay in a SNF, and that she desires for him to be d/c home to her care when ready.  She requests maximal HH assistance.  She is a Chartered certified accountant, and has a very close friend who is a Marine scientist who she states will be able to help her.  She is aware of his cognitive deficits, and that we recommend 24hr supervision.    Assessment/Plan:  Hypertensive crisis w/ encephalopathy BP now much better controlled - mental status appears to be approaching baseline - follow BP trend    SIRS (fever, leukocytosis, tachycardia) no obvious source of infection - PCCM was treating empirically for meningitis - completed 5 days of abx - follow w/o further abx tx   Two small adjacent cortical ischemic infarcts measuring up to 47mm within the high right parietal lobe near the vertex Care as per Neurology - embolic v/s atherosclerotic - no signif stenosis on carotid dopplers - no source of emboli on TTE - Plavix started per  Neuro   Chronic aspiration - hx of XRT to throat See notes per SLP - diet as per SLP - supplement w/ low volume IVF for now as intake quiet limited due to disliking pudding thick "liquids"   Acute renal failure Resolved - crt is normal   Parkinson's disease Resumed usual home med tx   Hx of Seizures  Quiescent at present   Hypothyroidism Cont synthroid   Hyperglycemia without history of DM Resolved - A1c 5.5 therefore not DM   OSA uncertain if prescribed CPAP but is generally non-compliant  H/o CAD, MI  Code Status: FULL Family Communication: no family present at time of exam - spoke w/ wife via phone at length  Disposition Plan: transfer to medical bed - cont PT/OT - follow BP control - wife preparing home for his return - plan is for D/C Thursday 12/29 if pt remains stable   Consultants: PCCM Neurology   Antibiotics: Ampicillin 12/22 > 12/25 Ceftriaxone 12/22 > 12/26 Pip/tazo 12/22 > 12/23 Vancomycin 12/22 > 12/25 Acyclovir 12/22 > 12/25  DVT prophylaxis: SQ heparin   Objective: Blood pressure 109/67, pulse 88, temperature 98.3 F (36.8 C), temperature source Oral, resp. rate 18, height 6' (1.829 m), weight 75.6 kg (166 lb 10.7 oz), SpO2 99 %.  Intake/Output Summary (Last 24 hours) at 03/16/15 1048 Last data filed at 03/16/15 0800  Gross per 24 hour  Intake    170 ml  Output   1125 ml  Net   -  955 ml   Exam: General: No acute respiratory distress - alert  Lungs: Clear to auscultation bilaterally  Cardiovascular: Regular rate and rhythm without murmur gallop or rub  Abdomen: Nontender, nondistended, soft, bowel sounds positive, no rebound, no ascites, no appreciable mass Extremities: No significant cyanosis, clubbing, or edema bilateral lower extremities  Data Reviewed: Basic Metabolic Panel:  Recent Labs Lab 03/13/15 0330 03/13/15 1200 03/14/15 0530 03/14/15 2350 03/15/15 0435  NA 138 143 141 144 144  K 2.9* 3.3* 2.8* 4.1 3.5  CL 108 109 106  108 108  CO2 21* 25 27 27 27   GLUCOSE 154* 94 168* 105* 95  BUN 17 12 9 9 12   CREATININE 1.17 0.95 0.86 0.90 0.98  CALCIUM 7.6* 8.0* 8.0* 8.5* 8.6*  MG 1.6* 2.3 2.2  --  2.3  PHOS <1.0* 1.7* 2.1*  --  2.7    CBC:  Recent Labs Lab 03/11/15 1510 03/11/15 2144 03/12/15 0243 03/12/15 0715 03/13/15 0330 03/14/15 0530 03/15/15 0435  WBC 16.8* 13.5* 17.0* 19.6* 15.8* 12.0* 9.7  NEUTROABS 15.1* 12.1* 15.2*  --   --   --   --   HGB 18.0* 16.4 15.1 13.7 12.6* 13.1 13.4  HCT 50.3 48.0 43.4 40.0 35.3* 37.6* 40.6  MCV 95.6 96.0 95.4 98.3 95.1 96.9 100.0  PLT 228 169 190 163 195 135* 166    Liver Function Tests:  Recent Labs Lab 03/11/15 1510  AST 43*  ALT 23  ALKPHOS 90  BILITOT 1.1  PROT 8.0  ALBUMIN 4.3    Coags:  Recent Labs Lab 03/11/15 1510  INR 1.07    Recent Labs Lab 03/11/15 2144  APTT 35    Cardiac Enzymes:  Recent Labs Lab 03/12/15 0243 03/12/15 2014  TROPONINI 0.14* 0.07*    CBG:  Recent Labs Lab 03/14/15 2305 03/15/15 0435 03/15/15 0725 03/15/15 1141 03/15/15 1614  GLUCAP 100* 95 92 96 111*    Recent Results (from the past 240 hour(s))  Blood culture (routine x 2)     Status: None   Collection Time: 03/11/15  3:00 PM  Result Value Ref Range Status   Specimen Description BLOOD RIGHT ANTECUBITAL  Final   Special Requests BOTTLES DRAWN AEROBIC AND ANAEROBIC 10CC  Final   Culture NO GROWTH 5 DAYS  Final   Report Status 03/16/2015 FINAL  Final  Blood culture (routine x 2)     Status: None   Collection Time: 03/11/15  3:05 PM  Result Value Ref Range Status   Specimen Description BLOOD RIGHT ARM  Final   Special Requests IN PEDIATRIC BOTTLE 4CC  Final   Culture NO GROWTH 5 DAYS  Final   Report Status 03/16/2015 FINAL  Final  Urine culture     Status: None   Collection Time: 03/11/15  3:32 PM  Result Value Ref Range Status   Specimen Description URINE, CATHETERIZED  Final   Special Requests NONE  Final   Culture NO GROWTH 2 DAYS   Final   Report Status 03/13/2015 FINAL  Final  MRSA PCR Screening     Status: None   Collection Time: 03/11/15  9:47 PM  Result Value Ref Range Status   MRSA by PCR NEGATIVE NEGATIVE Final    Comment:        The GeneXpert MRSA Assay (FDA approved for NASAL specimens only), is one component of a comprehensive MRSA colonization surveillance program. It is not intended to diagnose MRSA infection nor to guide or monitor treatment for MRSA  infections.      Studies:   Recent x-ray studies have been reviewed in detail by the Attending Physician  Scheduled Meds:  Scheduled Meds: . antiseptic oral rinse  7 mL Mouth Rinse BID  . atorvastatin  20 mg Oral q1800  . clopidogrel  75 mg Oral Daily  . heparin  5,000 Units Subcutaneous 3 times per day  . labetalol  200 mg Oral BID  . levothyroxine  112 mcg Oral QAC breakfast  . sodium chloride  3 mL Intravenous Q12H    Time spent on care of this patient: 35 mins   Taron Mondor T , MD   [facility] Office  [phone] Pager - Text Page per Shea Evans as per below:  On-Call/Text Page:      [email or web address]      password [email or web address]  If 7PM-7AM, please contact night-coverage [email or web address] Password [email or web address] 03/16/2015, 10:48 AM   LOS: 5 days

## 2015-03-16 NOTE — Progress Notes (Signed)
Speech Language Pathology Treatment: Dysphagia  Patient Details Name: Benjamin Park MRN: [id number] DOB: 06-10-1940 Today's Date: 03/16/2015 Time: [id number] SLP Time Calculation (min) (ACUTE ONLY): 10 min  Assessment / Plan / Recommendation Clinical Impression  Delayed cough with pudding suspected from pharyngeal residue. Max verbal and tactile cues given to implement precautions. Currently pt's IV not hooked up; discussed with Dr. Thereasa Solo restarting fluids due to pudding thick liquid status. [name] not present, however will attempt to speak with her re: baseline swallow status and plan (may be visiting at 11:00). Plan is to initiate honey thick liquids with known aspiration risks prior to discharge.  Pt may be discharged to SNF.    HPI HPI: 74 y.o. male past medical history of hypothyroidism and multiple admissions for acute encephalopathy attributed to hypertensive encephalopathy. Additional PMH includes [place] 2011, dysphagia, throat cancer, HTN, Parkinsons. MRI two small adjacent cortical ischemic infarcts measuring up to 8 mm within the high right parietal lobe near the vertex. Intubated less than 24 hours. CXR Progression of bibasilar atelectasis. Multiple MBS, most recent 08/16/14 Dys 1 and pudding thick liquids, MBS 02/02/14 recommended Dys 1, pudding thick liquids.       SLP Plan  Continue with current plan of care     Recommendations  Diet recommendations: Dysphagia 1 (puree);Pudding-thick liquid Liquids provided via: Teaspoon Medication Administration: Crushed with puree Supervision: Patient able to self feed;Full supervision/cueing for compensatory strategies Compensations: Slow rate;Small sips/bites;Multiple dry swallows after each bite/sip;Clear throat intermittently Postural Changes and/or Swallow Maneuvers: Seated upright 90 degrees              Oral Care Recommendations: Oral care QID Follow up Recommendations: Skilled Nursing facility Plan: Continue with current plan  of care   [name] 03/16/2015, 11:52 AM

## 2015-03-16 NOTE — Care Management Note (Signed)
Case Management Note  Patient Details  Name: JEFERSON REYMOND MRN: [id number] Date of Birth: [birth year]  Subjective/Objective:       Adm w encepalopathy             Action/Plan:phy there rec snf   Expected Discharge Date:                  Expected Discharge Plan:  Skilled Nursing Facility  In-House Referral:  Clinical Social Work  Discharge planning Services     Post Acute Care Choice:    Choice offered to:     DME Arranged:    DME Agency:     HH Arranged:    [place] Agency:     Status of Service:     Medicare Important Message Given:  Yes Date Medicare IM Given:    Medicare IM give by:    Date Additional Medicare IM Given:    Additional Medicare Important Message give by:     If discussed at [place] of Stay Meetings, dates discussed:    Additional Comments:sw ref has been placed  Lacretia Leigh, RN 03/16/2015, 9:03 AM

## 2015-03-16 NOTE — Progress Notes (Signed)
Nutrition Follow-up  DOCUMENTATION CODES:   Not applicable  INTERVENTION:   -Magic Cup TID with meals  NUTRITION DIAGNOSIS:   Predicted suboptimal nutrient intake related to dysphagia as evidenced by  (restrictive mechanically altered diet).  Ongoing  GOAL:   Patient will meet greater than or equal to 90% of their needs  Progressing  MONITOR:   PO intake, Supplement acceptance, Labs, Weight trends, Skin, I & O's  REASON FOR ASSESSMENT:   Consult Enteral/tube feeding initiation and management  ASSESSMENT:   74 year old male past medical history as below, which includes uncontrolled hypertension with history of hypertensive encephalopathy admissions, TIA, subarachnoid hemorrhage, MI, hypothyroidism, obstructive sleep apnea, and acute kidney injury. Is also recorded in prior cardiology office notes that he has a history of chronic aspiration which as been well-documented on swallowing evaluations. It is noted that he refuses to use a thickening solution. Most recent admission is just about a year ago for hypertensive encephalopathy. At baseline the patient is very functional and requires no assistance to perform his ADLs.  Pt extubated on 03/13/15. SLP evaluated after extubation and recommended NPO with nutrition via alternative means. Pt continued to receive TF unit NGT was pulled out on 03/14/15.   Pt underwent MBSS on 03/15/15, which revealed moderate to severe dysphagia with chronic aspiration risk. Pt was subsequently placed on a dysphagia 1 diet with pudding thick liquids. Pt with good appetite; meal completion 100%.   SLP evaluating pt at time of visit. Case discussed with RN and SLP. Plan is continue on dysphagia 1 diet with pudding thick liquids. Spoke with SLP, who reports that pt has chronic aspiration and does aspirate on any consistency thinner than pudding. She reports she just spoke with MD, who plans to increase IVFs to minimize dehydration risk.   Per RN, pt  with CSW consult for nursing home placement.   Labs reviewed.   Diet Order:  DIET - DYS 1 Room service appropriate?: Yes; Fluid consistency:: Pudding Thick  Skin:  Reviewed, no issues  Last BM:  PTA  Height:   Ht Readings from Last 1 Encounters:  03/11/15 6' (1.829 m)    Weight:   Wt Readings from Last 1 Encounters:  03/13/15 166 lb 10.7 oz (75.6 kg)    Ideal Body Weight:  80.9 kg  BMI:  Body mass index is 22.6 kg/(m^2).  Estimated Nutritional Needs:   Kcal:  1650-1850  Protein:  75-90 grams  Fluid:  1.6-1.8 L  EDUCATION NEEDS:   No education needs identified at this time  Gibran Veselka A. Jimmye Norman, RD, LDN, CDE Pager: [phone] After hours Pager: [phone]

## 2015-03-17 DIAGNOSIS — I63321 Cerebral infarction due to thrombosis of right anterior cerebral artery: Secondary | ICD-10-CM

## 2015-03-17 DIAGNOSIS — G20 Parkinson's disease: Secondary | ICD-10-CM

## 2015-03-17 LAB — COMPREHENSIVE METABOLIC PANEL
ALT: 29 U/L (ref 17–63)
AST: 26 U/L (ref 15–41)
Albumin: 2.8 g/dL — ABNORMAL LOW (ref 3.5–5.0)
Alkaline Phosphatase: 55 U/L (ref 38–126)
Anion gap: 10 (ref 5–15)
BUN: 19 mg/dL (ref 6–20)
CHLORIDE: 110 mmol/L (ref 101–111)
CO2: 26 mmol/L (ref 22–32)
Calcium: 8.6 mg/dL — ABNORMAL LOW (ref 8.9–10.3)
Creatinine, Ser: 0.94 mg/dL (ref 0.61–1.24)
Glucose, Bld: 108 mg/dL — ABNORMAL HIGH (ref 65–99)
POTASSIUM: 3.3 mmol/L — AB (ref 3.5–5.1)
Sodium: 146 mmol/L — ABNORMAL HIGH (ref 135–145)
TOTAL PROTEIN: 5.6 g/dL — AB (ref 6.5–8.1)
Total Bilirubin: 0.4 mg/dL (ref 0.3–1.2)

## 2015-03-17 LAB — CBC
HEMATOCRIT: 40.2 % (ref 39.0–52.0)
Hemoglobin: 13 g/dL (ref 13.0–17.0)
MCH: 32.7 pg (ref 26.0–34.0)
MCHC: 32.3 g/dL (ref 30.0–36.0)
MCV: 101 fL — AB (ref 78.0–100.0)
PLATELETS: 163 10*3/uL (ref 150–400)
RBC: 3.98 MIL/uL — AB (ref 4.22–5.81)
RDW: 13.2 % (ref 11.5–15.5)
WBC: 7.4 10*3/uL (ref 4.0–10.5)

## 2015-03-17 MED ORDER — RESOURCE THICKENUP CLEAR PO POWD: ORAL | Status: DC | PRN

## 2015-03-17 MED ORDER — POTASSIUM CHLORIDE CRYS ER 20 MEQ PO TBCR: 40.0000 meq | EXTENDED_RELEASE_TABLET | Freq: Once | ORAL | Status: DC

## 2015-03-17 MED ORDER — ACETAMINOPHEN 325 MG PO TABS
650.0000 mg | ORAL_TABLET | ORAL | Status: DC | PRN
  Administered 2015-03-17: 650 mg via ORAL
  Filled 2015-03-17: qty 2

## 2015-03-17 MED ORDER — POTASSIUM CHLORIDE 20 MEQ/15ML (10%) PO SOLN
40.0000 meq | Freq: Once | ORAL | Status: AC
  Administered 2015-03-17: 40 meq via ORAL
  Filled 2015-03-17: qty 30

## 2015-03-17 NOTE — Progress Notes (Signed)
PROGRESS NOTE  KAIKANE BENGEL [id number] DOB: 1940-10-17 DOA: 03/11/2015 PCP: No primary care provider on file.  Brief Narrative: 74 year old male w/ a hx of uncontrolled hypertension, hypertensive encephalopathy, TIA, subarachnoid hemorrhage, MI, hypothyroidism, obstructive sleep apnea, and chronic aspiration which has been well-documented on prior swallowing evaluations (refuses to use a thickening solution) who presented to [facility] ED 12/22 complaining of a 2 day history of altered mental status. His family found him on the floor in his own urine. In the emergency department he was profoundly hypertensive.  Significant Events: 12/23 > admit for acute encephalopathy  Subjective: Patient noted to be sitting on a chair. He denies any complaints. No shortness of breath. No nausea, vomiting.   Assessment/Plan:  Hypertensive crisis w/ encephalopathy BP now much better controlled - mental status appears to be approaching baseline - follow BP trend    SIRS (fever, leukocytosis, tachycardia) no obvious source of infection - PCCM was treating empirically for meningitis - completed 5 days of abx - follow w/o further abx tx   Two small adjacent cortical ischemic infarcts measuring up to 78mm within the high right parietal lobe near the vertex Seen by neurology. embolic v/s atherosclerotic - no signif stenosis on carotid dopplers - no source of emboli on TTE - Plavix started per Neuro   Chronic aspiration - hx of XRT to throat See notes per SLP - diet as per SLP - supplement w/ low volume IVF for now as intake quiet limited due to disliking pudding thick "liquids"   Acute renal failure Resolved - creatinine is normal   Parkinson's disease Resumed usual home med tx   Hx of Seizures  Quiescent at present   Hypothyroidism Continue synthroid   Hyperglycemia without history of DM Resolved - A1c 5.5 therefore not DM   OSA uncertain if prescribed CPAP but is generally  non-compliant  H/o CAD, MI  Hypokalemia Potassium will be repleted  Code Status: FULL Family Communication: no family present at time of exam. Dr. Thereasa Solo has discussed with wife yesterday in great detail. Please see his note. Disposition Plan: wife preparing home for his return - plan is for D/C Thursday 12/29 if pt remains stable   Consultants: PCCM Neurology   Antibiotics: Ampicillin 12/22 > 12/25 Ceftriaxone 12/22 > 12/26 Pip/tazo 12/22 > 12/23 Vancomycin 12/22 > 12/25 Acyclovir 12/22 > 12/25  DVT prophylaxis: SQ heparin   Objective: Blood pressure 136/86, pulse 99, temperature 97.7 F (36.5 C), temperature source Oral, resp. rate 18, height 6' (1.829 m), weight 75.6 kg (166 lb 10.7 oz), SpO2 95 %.  Intake/Output Summary (Last 24 hours) at 03/17/15 1034 Last data filed at 03/17/15 1010  Gross per 24 hour  Intake 904.66 ml  Output   1670 ml  Net -765.34 ml   Exam: General: Awake, alert. In no distress. Lungs: Clear to auscultation bilaterally  Cardiovascular: Regular rate and rhythm without murmur gallop or rub  Abdomen: Nontender, nondistended, soft, bowel sounds positive, no rebound, no ascites, no appreciable mass  Data Reviewed: Basic Metabolic Panel:  Recent Labs Lab 03/13/15 0330 03/13/15 1200 03/14/15 0530 03/14/15 2350 03/15/15 0435 03/17/15 0548  NA 138 143 141 144 144 146*  K 2.9* 3.3* 2.8* 4.1 3.5 3.3*  CL 108 109 106 108 108 110  CO2 21* 25 27 27 27 26   GLUCOSE 154* 94 168* 105* 95 108*  BUN 17 12 9 9 12 19   CREATININE 1.17 0.95 0.86 0.90 0.98 0.94  CALCIUM 7.6* 8.0*  8.0* 8.5* 8.6* 8.6*  MG 1.6* 2.3 2.2  --  2.3  --   PHOS <1.0* 1.7* 2.1*  --  2.7  --     CBC:  Recent Labs Lab 03/11/15 1510 03/11/15 2144 03/12/15 0243 03/12/15 0715 03/13/15 0330 03/14/15 0530 03/15/15 0435 03/17/15 0548  WBC 16.8* 13.5* 17.0* 19.6* 15.8* 12.0* 9.7 7.4  NEUTROABS 15.1* 12.1* 15.2*  --   --   --   --   --   HGB 18.0* 16.4 15.1 13.7 12.6* 13.1  13.4 13.0  HCT 50.3 48.0 43.4 40.0 35.3* 37.6* 40.6 40.2  MCV 95.6 96.0 95.4 98.3 95.1 96.9 100.0 101.0*  PLT 228 169 190 163 195 135* 166 163    Liver Function Tests:  Recent Labs Lab 03/11/15 1510 03/17/15 0548  AST 43* 26  ALT 23 29  ALKPHOS 90 55  BILITOT 1.1 0.4  PROT 8.0 5.6*  ALBUMIN 4.3 2.8*    Coags:  Recent Labs Lab 03/11/15 1510  INR 1.07    Recent Labs Lab 03/11/15 2144  APTT 35    Cardiac Enzymes:  Recent Labs Lab 03/12/15 0243 03/12/15 2014  TROPONINI 0.14* 0.07*    CBG:  Recent Labs Lab 03/14/15 2305 03/15/15 0435 03/15/15 0725 03/15/15 1141 03/15/15 1614  GLUCAP 100* 95 92 96 111*    Recent Results (from the past 240 hour(s))  Blood culture (routine x 2)     Status: None   Collection Time: 03/11/15  3:00 PM  Result Value Ref Range Status   Specimen Description BLOOD RIGHT ANTECUBITAL  Final   Special Requests BOTTLES DRAWN AEROBIC AND ANAEROBIC 10CC  Final   Culture NO GROWTH 5 DAYS  Final   Report Status 03/16/2015 FINAL  Final  Blood culture (routine x 2)     Status: None   Collection Time: 03/11/15  3:05 PM  Result Value Ref Range Status   Specimen Description BLOOD RIGHT ARM  Final   Special Requests IN PEDIATRIC BOTTLE 4CC  Final   Culture NO GROWTH 5 DAYS  Final   Report Status 03/16/2015 FINAL  Final  Urine culture     Status: None   Collection Time: 03/11/15  3:32 PM  Result Value Ref Range Status   Specimen Description URINE, CATHETERIZED  Final   Special Requests NONE  Final   Culture NO GROWTH 2 DAYS  Final   Report Status 03/13/2015 FINAL  Final  MRSA PCR Screening     Status: None   Collection Time: 03/11/15  9:47 PM  Result Value Ref Range Status   MRSA by PCR NEGATIVE NEGATIVE Final    Comment:        The GeneXpert MRSA Assay (FDA approved for NASAL specimens only), is one component of a comprehensive MRSA colonization surveillance program. It is not intended to diagnose MRSA infection nor to guide  or monitor treatment for MRSA infections.       Scheduled Meds:  Scheduled Meds: . antiseptic oral rinse  7 mL Mouth Rinse BID  . atorvastatin  20 mg Oral q1800  . clopidogrel  75 mg Oral Daily  . heparin  5,000 Units Subcutaneous 3 times per day  . labetalol  200 mg Oral BID  . levothyroxine  112 mcg Oral QAC breakfast  . potassium chloride  40 mEq Oral Once  . sodium chloride  3 mL Intravenous Q12H    Time spent on care of this patient: 35 mins   Bonnielee Haff , MD  [phone]  [facility] Office  [phone] Pager - Text Page per Shea Evans as per below:  On-Call/Text Page:      [email or web address]      password [email or web address]  If 7PM-7AM, please contact night-coverage [email or web address] Password [email or web address] 03/17/2015, 10:34 AM   LOS: 6 days

## 2015-03-17 NOTE — Progress Notes (Signed)
Speech Language Pathology Treatment: Dysphagia  Patient Details Name: Benjamin Park MRN: [id number] DOB: Jan 13, 1941 Today's Date: 03/17/2015 Time: 1205-1220 SLP Time Calculation (min) (ACUTE ONLY): 15 min  Assessment / Plan / Recommendation Clinical Impression  Treatment focused on education with pt's wife via phone. Educated wife re: current MBS results, recommendations (aspirates any po' s thinner than pudding/puree consistency) and discussed premorbid diet/liquids. Pt is at home alone 4 days a week while wife works. She states he mixes his liquids to "not quite honey consistency." Pt will be unable to sustain hydration on pudding thick once discharged therefore explained/educated allowing pt to have honey thick liquids with family accepting known risks which wife is in agreement. SLP concerned with pt's safety at home alone and ability to thicken liquids. Wife may be able to thicken liquids prior to going to work (?). Wife reports he refused to stay at SNF during last admission. RN case Freight forwarder, please add Home Health Speech Pathology.   HPI HPI: 74 y.o. male past medical history of hypothyroidism and multiple admissions for acute encephalopathy attributed to hypertensive encephalopathy. Additional PMH includes [place] 2011, dysphagia, throat cancer, HTN, Parkinsons. MRI two small adjacent cortical ischemic infarcts measuring up to 8 mm within the high right parietal lobe near the vertex. Intubated less than 24 hours. CXR Progression of bibasilar atelectasis. Multiple MBS, most recent 08/16/14 Dys 1 and pudding thick liquids, MBS 02/02/14 recommended Dys 1, pudding thick liquids.       SLP Plan  Continue with current plan of care     Recommendations  Diet recommendations: Dysphagia 2 (fine chop);Honey-thick liquid Liquids provided via: Cup;No straw Medication Administration: Crushed with puree Supervision: Patient able to self feed;Full supervision/cueing for compensatory  strategies Compensations: Slow rate;Small sips/bites;Multiple dry swallows after each bite/sip;Clear throat intermittently Postural Changes and/or Swallow Maneuvers: Seated upright 90 degrees              Oral Care Recommendations: Oral care BID Follow up Recommendations: Skilled Nursing facility (wife informed this SLP pt is going home) Plan: Continue with current plan of care   Benjamin Park 03/17/2015, 1:30 PM   Benjamin Park Benjamin Park Benjamin Park.Ed [employer] [phone]

## 2015-03-17 NOTE — Progress Notes (Signed)
CSW received consult regarding SNF placement at discharge.  Patient is refusing SNF and wants to return home.  CSW signing off.  Percell Locus Awanda Wilcock LCSWA [phone]

## 2015-03-17 NOTE — Care Management Important Message (Signed)
Important Message  Patient Details  Name: KENITH PRABHAKAR MRN: [id number] Date of Birth: 1940/08/08   Medicare Important Message Given:  Yes    Carles Collet, RN 03/17/2015, 12:33 [place] Message  Patient Details  Name: ONAJE ANGON MRN: [id number] Date of Birth: 07/13/1940   Medicare Important Message Given:  Yes    Carles Collet, RN 03/17/2015, 12:32 PM

## 2015-03-17 NOTE — Evaluation (Signed)
Occupational Therapy Evaluation Patient Details Name: Benjamin Park MRN: [id number] DOB: [birth year] Today's Date: 03/17/2015    History of Present Illness Benjamin Park is a 74 y.o. male hx of TIA/stroke, HLD, SAH, Parkinson's admitted for 2 days of increasing confusion. At baseline family reports he is high functioning and able to perform all ADLs. 2 days ago they note he became increasingly confused. On day of admission family found him unconscious in his own urine. Admission labs pertinent for WBC 16.8, BUN 23, lactic acid 2.61. Was intubated for airway protection. MRI: Two small adjacent cortical ischemic infarcts measuring up to 8 mm within the high right parietal lobe near the vertex.     Clinical Impression   Patient presenting with decreased ADL and functional mobility independence secondary to above. Patient independent to mod I PTA. Patient currently functioning at an overall supervision to min assist level. Patient will benefit from acute OT to increase overall independence in the areas of ADLs, functional mobility, and overall safety in order to safely discharge to venue listed below.   Upon entering room, pt on phone with wife. Wife discussed concern of patient's high blood pressure and stated "If his diastolic is above 90 he will be more confused". Therapist discussed this concern with patient's RN. From here, assisted patient supine to EOB and BP checked=149/102. Pt donned sock and sat EOB for ~5 minutes. BP checked again=135/92. Assisted patient EOB to bed side recliner. BP checked=138/98 with BLEs elevated. Assisted patient with breakfast, cueing needed for 2 swallows per bite. Therapist noticed thin liquid drink on bed side table and immediately removed, notifying RN of this as well.     Follow Up Recommendations  SNF;Supervision/Assistance - 24 hour    Equipment Recommendations  Other (comment) (TBD)    Recommendations for Other Services  None at this time     Precautions / Restrictions Precautions Precautions: Fall Restrictions Weight Bearing Restrictions: No    Mobility Bed Mobility Overal bed mobility: Needs Assistance Bed Mobility: Supine to Sit     Supine to sit: Min guard     General bed mobility comments: Assist for elevation of trunk.    Transfers Overall transfer level: Needs assistance Equipment used: Rolling walker (2 wheeled) Transfers: Sit to/from Stand Sit to Stand: Min assist General transfer comment: Needs steadying assist in standing position with RW.      Balance Overall balance assessment: Needs assistance Sitting-balance support: No upper extremity supported;Feet supported Sitting balance-Leahy Scale: Fair     Standing balance support: Bilateral upper extremity supported;During functional activity Standing balance-Leahy Scale: Fair    ADL Overall ADL's : Needs assistance/impaired Eating/Feeding: Set up;Cueing for safety Eating/Feeding Details (indicate cue type and reason): Pt on D1 and pudding thick liquid, pt required cues for 2 swallows per bite Grooming: Set up;Sitting   Upper Body Bathing: Set up;Sitting   Lower Body Bathing: Minimal assistance;Sit to/from stand   Upper Body Dressing : Set up;Sitting   Lower Body Dressing: Minimal assistance;Sit to/from stand   Toilet Transfer: Min guard;RW;BSC Functional mobility during ADLs: Minimal assistance;Rolling walker;Cueing for safety General ADL Comments: Pt required increased time for mobility and increased to for following commands     Vision Additional Comments: Will continue to functionally assess          Pertinent Vitals/Pain Pain Assessment: No/denies pain     Hand Dominance Right   Extremity/Trunk Assessment Upper Extremity Assessment Upper Extremity Assessment: Generalized weakness   Lower Extremity Assessment Lower Extremity Assessment: Generalized  weakness   Cervical / Trunk Assessment Cervical / Trunk Assessment:  Kyphotic   Communication Communication Communication: HOH   Cognition Arousal/Alertness: Awake/alert Behavior During Therapy: Flat affect Overall Cognitive Status: Impaired/Different from baseline Area of Impairment: Safety/judgement;Problem solving;Following commands;Awareness     Memory: Decreased short-term memory Following Commands: Follows one step commands with increased time Safety/Judgement: Decreased awareness of safety Awareness: Emergent Problem Solving: Slow processing;Difficulty sequencing;Requires verbal cues                Home Living Family/patient expects to be discharged to:: Private residence Living Arrangements: Spouse/significant other Available Help at Discharge: Family;Available PRN/intermittently Type of Home: House Home Access: Stairs to enter [employer] of Steps: 5 Entrance Stairs-Rails: Right Home Layout: One level;Laundry or work area in [place] Shower/Tub: Teacher, early years/pre: [place]: Environmental consultant - 2 wheels;Cane - single point;Shower seat   Additional Comments: pt's wife works as Quarry manager at [employer] 2nd shift as well as Ecologist a few mornings a week. Pt does not drive. All information was per PT info entered.       Prior Functioning/Environment Level of Independence: Independent with assistive device(s)  Comments: walks with cane, frequent falls. Mows grass on his own    OT Diagnosis: Generalized weakness;Acute pain;Altered mental status   OT Problem List: Decreased strength;Decreased activity tolerance;Impaired balance (sitting and/or standing);Impaired vision/perception;Decreased cognition;Decreased safety awareness;Decreased knowledge of use of DME or AE;Pain;Decreased knowledge of precautions   OT Treatment/Interventions: Self-care/ADL training;Therapeutic exercise;Energy conservation;DME and/or AE instruction;Therapeutic activities;Patient/family  education;Balance training    OT Goals(Current goals can be found in the care plan section) Acute Rehab OT Goals Patient Stated Goal: did not state OT Goal Formulation: With patient Time For Goal Achievement: 03/31/15 Potential to Achieve Goals: Good ADL Goals Pt Will Perform Grooming: with modified independence;standing Pt Will Transfer to Toilet: with supervision;ambulating;bedside commode Additional ADL Goal #1: Pt will be supervision for mobility within room using LRAD  OT Frequency: Min 2X/week   Barriers to D/C: Decreased caregiver support  wife works 2nd shift   End of [place] During Treatment: Administrator, arts Communication: Mobility status;Other (comment) (Pt's impaired cognition, therapist set-up pt for breakfast, therapist noticed thin liquid on tray)  Activity Tolerance: Patient tolerated treatment well Patient left: in chair;with call bell/phone within reach;with chair alarm set   Time: [id number] OT Time Calculation (min): 29 min Charges:  OT General Charges $OT Visit: 1 Procedure OT Evaluation $Initial OT Evaluation Tier I: 1 Procedure OT Treatments $Self Care/Home Management : 8-22 mins  Chrys Racer , MS, OTR/L, CLT Pager: [phone]  03/17/2015, 9:29 AM

## 2015-03-17 NOTE — Progress Notes (Signed)
Physical Therapy Treatment Patient Details Name: Benjamin Park MRN: [id number] DOB: 01-14-1941 Today's Date: 03/17/2015    History of Present Illness Benjamin Park is a 74 y.o. male hx of TIA/stroke, HLD, SAH, Parkinson's admitted for 2 days of increasing confusion. At baseline family reports he is high functioning and able to perform all ADLs. 2 days ago they note he became increasingly confused. On day of admission family found him unconscious in his own urine. Admission labs pertinent for WBC 16.8, BUN 23, lactic acid 2.61. Was intubated for airway protection. MRI: Two small adjacent cortical ischemic infarcts measuring up to 8 mm within the high right parietal lobe near the vertex.      PT Comments    Pt OOB in recliner.  Assisted with amb in hallway using RW for increased safety/stability however required assist to correctly advance (s/p CVA). Following commands.  Pleasant.    Follow Up Recommendations  SNF     Equipment Recommendations       Recommendations for Other Services       Precautions / Restrictions Precautions Precautions: Fall Precaution Comments: droplet Restrictions Weight Bearing Restrictions: No    Mobility  Bed Mobility Overal bed mobility: Needs Assistance Bed Mobility: Supine to Sit     Supine to sit: Min guard     General bed mobility comments: pt OOB in recliner  Transfers Overall transfer level: Needs assistance Equipment used: Rolling walker (2 wheeled) Transfers: Sit to/from Stand Sit to Stand: Min assist;Mod assist         General transfer comment: needs VC's on proper hand placement esp with stand to sit  Ambulation/Gait Ambulation/Gait assistance: Min assist;Mod assist Ambulation Distance (Feet): 115 Feet Assistive device: Rolling walker (2 wheeled) Gait Pattern/deviations: Step-to pattern;Step-through pattern     General Gait Details: Needs steadying assist in standing position with RW and control advancing  walker   Stairs            Wheelchair Mobility    Modified Rankin (Stroke Patients Only)       Balance Overall balance assessment: Needs assistance Sitting-balance support: No upper extremity supported;Feet supported Sitting balance-Leahy Scale: Fair     Standing balance support: Bilateral upper extremity supported;During functional activity Standing balance-Leahy Scale: Fair                      Cognition   Behavior During Therapy: WFL for tasks assessed/performed                        Exercises      General Comments        Pertinent Vitals/Pain Pain Assessment: No/denies pain    Home Living                      Prior Function            PT Goals (current goals can now be found in the care plan section) Acute Rehab PT Goals Patient Stated Goal: did not state Progress towards PT goals: Progressing toward goals    Frequency  Min 3X/week    PT Plan      Co-evaluation             End of Session Equipment Utilized During Treatment: Gait belt Activity Tolerance: Patient tolerated treatment well Patient left: in chair;with call bell/phone within reach;with chair alarm set     Time: 1030-1047 PT Time Calculation (min) (ACUTE ONLY): 17  min  Charges:  $Gait Training: 8-22 mins                    G Codes:      Rica Koyanagi  PTA [facility]  Acute  Rehab Pager      [phone]

## 2015-03-17 NOTE — Care Management Note (Signed)
Case Management Note  Patient Details  Name: Benjamin Park MRN: [id number] Date of Birth: 1940/07/22  Subjective/Objective:                 Spoke with patient's wife over the phone as requested by patient to coordinate home care services. Patient's wife states that patient has DME walker and tub bench at home and will not need any further equipment to the best of her knowledge. She wants to take him home because he was discharged to a SNF with his las admission and wanted to go home after 17 hours and she feels like she can handle him at home. They used [facility] in the past and would like to continue with them, specifically "[facility]."    Action/Plan:  Referral made to [facility] for [facility] RN [facility] PT OT SW.   Expected Discharge Date:                  Expected Discharge Plan:  [place]  In-House Referral:  Clinical Social Work  Discharge planning Services  CM Consult  Post Acute Care Choice:  Home Health Choice offered to:  Spouse, Patient  DME Arranged:    DME Agency:     HH Arranged:  RN, PT, OT, Nurse's Aide, Social Work [employer] Agency:     Status of Service:  Completed, signed off  Medicare Important Message Given:  Yes Date Medicare IM Given:    Medicare IM give by:    Date Additional Medicare IM Given:    Additional Medicare Important Message give by:     If discussed at [place] of Stay Meetings, dates discussed:    Additional Comments:  Carles Collet, RN 03/17/2015, 12:29 PM

## 2015-03-18 LAB — METHYLMALONIC ACID, SERUM: Methylmalonic Acid, Quantitative: 80 nmol/L (ref 0–378)

## 2015-03-18 MED ORDER — ATORVASTATIN CALCIUM 20 MG PO TABS: 20.0000 mg | ORAL_TABLET | Freq: Every day | ORAL | Status: DC

## 2015-03-18 MED ORDER — CLOPIDOGREL BISULFATE 75 MG PO TABS: 75.0000 mg | ORAL_TABLET | Freq: Every day | ORAL | Status: DC

## 2015-03-18 MED ORDER — PROMETHAZINE HCL 12.5 MG RE SUPP: 12.5000 mg | Freq: Three times a day (TID) | RECTAL | Status: DC | PRN

## 2015-03-18 NOTE — Discharge Summary (Addendum)
Physician Discharge Summary  Benjamin Park WAS [id number] DOB: 04-10-1940 DOA: 03/11/2015  PCP: No primary care provider on file.  Admit date: 03/11/2015 Discharge date: 03/18/2015  Time spent: 35 minutes  Recommendations for Outpatient Follow-up:  1. Please follow up on blood pressures, he was discharged on labetalol 200 mg by mouth twice a day. 2. Patient started on Plavix any 5 mg by mouth daily during this hospitalization. Previously on aspirin however MRI revealed cortical ischemic infarcts. 3. Physical therapy had recommended rehabilitation at skilled nursing facility however family members expressing wishes to have him home with home health services. He was set up for home health physical therapy and speech pathology.   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 male past medical history of hypothyroidism and multiple admissions for acute encephalopathy attributed to hypertensive encephalopathy. Most of the history is obtained from the chart and family as patient cannot provide history. The wife relates he is a highly functional man was feeling well until 2 days ago she started becoming altered which progressed to today. His wife found him on the floor on his own urine. She relates that he's has denied chest pain shortness of breath fever nausea vomiting diarrhea constipation hematemesis and dysuria.  Hospital Course:  74 year old male w/ a hx  of uncontrolled hypertension, hypertensive encephalopathy, TIA, subarachnoid hemorrhage, MI, hypothyroidism, obstructive sleep apnea, and chronic aspiration which has been well-documented on prior swallowing evaluations (refuses to use a thickening solution) who presented to [facility] ED [facility]/22 complaining of a 2 day history of altered mental status. His family found him on the floor in his own urine. In the emergency department he was profoundly hypertensive. Lab work revealed white count of 16,800 with the presence of bands and elevated lactate. Pulmonary critical care medicine was consulted, patient underwent rapid sequence and the patient on 03/12/2015 for acute on chronic respiratory failure with hypoxemia. There was suspicion that source of sepsis could be aspiration pneumonia. He was treated with IV vancomycin and Zosyn. He underwent successful extubation on 03/13/2015. MRI of brain performed on 03/13/2015 showed 2 small adjacent cortical ischemic infarcts measuring up to 8 mm. Neurology was consulted. Prior to hospitalization he had been on aspirin 325 mg by mouth daily which was changed to Plavix 75 mg by mouth daily. Transthoracic echocardiogram did not reveal cardiac source of emboli. Carotid Dopplers were unremarkable. During this hospitalization he completed 5 days of IV antibiotic therapy. He showed gradual clinical improvement. Physical therapy had recommended skilled nursing facility placement for rehabilitation. Family members expressing wish wishes to care for him at home. Case manager consulted, he was set up with home health services for physical therapy and speech pathology. He was discharged was home in stable condition on 03/18/2015.  Procedures: LINES/TUBES: PIV R IJ TLC 12/23 R radial a-line 12/23 ETT 12/22>>>12/24  Consultations:  Neurology    pulmonary critical care medicine  Discharge Exam: Filed Vitals:   03/18/15 [id number]  03/18/15 1336  BP: 149/88 121/75  Pulse: 79 77   Temp: 98.3 F (36.8 C) 97.8 F (36.6 C)  Resp: 18 20    General: Awake, alert. In no distress. Lungs: Clear to auscultation bilaterally  Cardiovascular: Regular rate and rhythm without murmur gallop or rub  Abdomen: Nontender, nondistended, soft, bowel sounds positive, no rebound, no ascites, no appreciable mass  Discharge Instructions   Discharge Instructions    Ambulatory referral to Neurology    Complete by:  As directed   Pt will follow up with NP Ms. Hassell Done in about one month. Ms. Hassell Done saw this pt in [place] in 2014. Thanks.     Call MD for:  difficulty breathing, headache or visual disturbances    Complete by:  As directed      Call MD for:  extreme fatigue    Complete by:  As directed      Call MD for:  hives    Complete by:  As directed      Call MD for:  persistant dizziness or light-headedness    Complete by:  As directed      Call MD for:  persistant nausea and vomiting    Complete by:  As directed      Call MD for:  redness, tenderness, or signs of infection (pain, swelling, redness, odor or green/yellow discharge around incision site)    Complete by:  As directed      Call MD for:  severe uncontrolled pain    Complete by:  As directed      Call MD for:  temperature >100.4    Complete by:  As directed      Call MD for:    Complete by:  As directed      Diet - low sodium heart healthy    Complete by:  As directed      Increase activity slowly    Complete by:  As directed           Current Discharge Medication List    START taking these medications   Details  atorvastatin (LIPITOR) 20 MG tablet Take 1 tablet (20 mg total) by mouth daily at 6 PM. Qty: 30 tablet, Refills: 1    clopidogrel (PLAVIX) 75 MG tablet Take 1 tablet (75 mg total) by mouth daily. Qty: 30 tablet, Refills: 0    promethazine (PHENERGAN) 12.5 MG suppository Place 1 suppository (12.5 mg total) rectally every 8 (eight) hours as needed for nausea or vomiting. Qty: 6 each, Refills: 0       CONTINUE these medications which have NOT CHANGED   Details  ALPRAZolam (XANAX) 0.25 MG tablet Take 0.25 mg by mouth daily as needed for anxiety (combative).     Ascorbic Acid (VITAMIN C PO) Take 1 tablet by mouth daily.    chlorthalidone (HYGROTON) 25 MG tablet Take 25 mg by mouth daily.    cloNIDine (CATAPRES) 0.1 MG tablet Take 0.1 mg by mouth as needed (high blood pressure).     !! ENSURE (ENSURE) Take 237 mLs by mouth daily.    fish oil-omega-3 fatty acids 1000 MG capsule Take 1 g by mouth daily.     GLUCOSAMINE HCL PO Take 1 tablet by mouth daily.    KLOR-CON M10 10 MEQ tablet Take 10 mEq by mouth daily.    labetalol (NORMODYNE) 200 MG tablet Take 200 mg by mouth 2 (two) times daily.    levothyroxine (SYNTHROID, LEVOTHROID) 112 MCG tablet Take 112 mcg by  mouth daily.     Multiple Vitamin (MULITIVITAMIN WITH MINERALS) TABS Take 1 tablet by mouth daily.      !! Nutritional Supplements (CARNATION BREAKFAST ESSENTIALS PO) Take 1 Bottle by mouth daily.    Maltodextrin-Xanthan Gum (RESOURCE THICKENUP CLEAR) POWD Take 1 g by mouth as needed. Qty: 1 Can, Refills: 6    testosterone cypionate (DEPOTESTOTERONE CYPIONATE) 200 MG/ML injection Inject 1 mL (200 mg total) into the muscle every 28 (twenty-eight) days. Qty: 10 mL, Refills: 5     !! - Potential duplicate medications found. Please discuss with provider.    STOP taking these medications     aspirin 325 MG tablet      doxycycline (VIBRA-TABS) 100 MG tablet        No Known Allergies Follow-up Information    Follow up with Dennie Bible, NP. Schedule an appointment as soon as possible for a visit in 1 month.   Specialty:  Family Medicine   Why:  stroke clinic   Contact information:   [address] [place] [address] [phone]        The results of significant diagnostics from this hospitalization (including imaging, microbiology, ancillary and laboratory) are listed below for  reference.    Significant Diagnostic Studies: Dg Abd 1 View  03/12/2015  CLINICAL DATA:  Nasogastric tube placement EXAM: ABDOMEN - 1 VIEW COMPARISON:  None. FLUOROSCOPY TIME:  6 minutes 0 seconds.  One submitted image FINDINGS: Nasogastric tube tip and side port in stomach. Visualized bowel gas pattern normal. IMPRESSION: Nasogastric tube tip and side port in stomach. Electronically Signed   By: Lowella Grip III M.D.   On: 03/12/2015 14:33   Ct Head Wo Contrast  03/11/2015  CLINICAL DATA:  Altered mental status.  Incontinence and vomiting. EXAM: CT HEAD WITHOUT CONTRAST TECHNIQUE: Contiguous axial images were obtained from the base of the skull through the vertex without contrast. COMPARISON:  08/14/2014 FINDINGS: There is extensive cerebral and cerebellar atrophy which is unchanged. Again noted is low density throughout the white matter which is similar to the previous examination. No evidence for acute hemorrhage, mass lesion, midline shift, hydrocephalus or new large infarct. Mucosal disease in the maxillary sinuses, left side greater than right. Some mucosal disease in the anterior ethmoid air cells. Negative for a calvarial fracture. IMPRESSION: No acute intracranial abnormality. Stable cerebral and cerebellar atrophy. Diffuse white matter changes likely related to chronic small vessel ischemic disease. Electronically Signed   By: Markus Daft M.D.   On: 03/11/2015 16:01   Mr Brain Wo Contrast  03/13/2015  CLINICAL DATA:  Initial evaluation for acute encephalopathy. Found unresponsive. EXAM: MRI HEAD WITHOUT CONTRAST TECHNIQUE: Multiplanar, multiecho pulse sequences of the brain and surrounding structures were obtained without intravenous contrast. COMPARISON:  Prior CT from 03/11/2015, previous MRI from 02/01/2014, as well as multiple previous studies. FINDINGS: Abnormal diffusion-weighted signal and T2 hyperintensity involving the corpus callosum, middle cerebellar peduncle is, and scattered  throughout the bilateral frontal and parietal white matter overall a is likely not significantly changed relative to previous MRI. Slight increased prominence of high signal intensity on DWI images within these affected areas favored to be related to technique as compared to previous scan. Patchy T2 hyperintensities elsewhere in the cerebral white matter bilaterally do not appear significantly changed, most consistent with moderate chronic small vessel ischemic disease. Advanced cerebral and cerebellar atrophy is unchanged. There are 2 small adjacent subtle foci of restricted diffusion involving the cortical gray matter of the  high right parietal lobe near the vertex (series 4, image 47). The larger of these foci of measures 8 mm. These are not seen on prior study, and likely reflects small cortical infarcts. No associated hemorrhage or mass effect. Major intracranial vascular flow voids are preserved. Small focus of susceptibility artifact within the left parietal lobe is noted, unchanged. No mass lesion, midline shift, or mass effect. Ventricular prominence related to global parenchymal volume loss present without hydrocephalus. No extra-axial fluid collection. Craniocervical junction within normal limits. Pituitary gland normal.  No acute abnormality about the orbits. Moderate polypoid opacity within the left maxillary sinus present. Scattered mucosal thickening within the right maxillary sinus, ethmoidal air cells, and sphenoid sinuses. Small amount of layering opacity within the posterior nasopharynx. Patient is likely intubated. Trace opacity within the left mastoid air cells. Inner ear structures grossly normal. Bone marrow signal intensity within normal limits. No scalp soft tissue abnormality. IMPRESSION: 1. Two small adjacent cortical ischemic infarcts measuring up to 8 mm within the high right parietal lobe near the vertex. No associated hemorrhage or significant mass effect. 2. Otherwise unchanged  advanced cerebral atrophy with chronic small vessel ischemic disease. Chronic signal changes in corpus callosum, middle cerebellar peduncles, and cerebral white matter are otherwise stable. Electronically Signed   By: Jeannine Boga M.D.   On: 03/13/2015 05:20   Dg Chest Port 1 View  03/13/2015  CLINICAL DATA:  Endotracheal tube position EXAM: PORTABLE CHEST 1 VIEW COMPARISON:  03/12/2015 FINDINGS: Endotracheal tube is approximately 10 mm above the carina . Recommend withdrawal of 2 cm. NG tube in the stomach. Central venous catheter tip in the lower SVC. PFO closure device noted. Bibasilar atelectasis shows mild progression.  Negative for edema. IMPRESSION: Endotracheal tube is low, recommend withdrawal 2 cm. Progression of bibasilar atelectasis. Electronically Signed   By: Franchot Gallo M.D.   On: 03/13/2015 09:12   Dg Chest Port 1 View  03/12/2015  CLINICAL DATA:  73 year old male with central line placement EXAM: PORTABLE CHEST 1 VIEW COMPARISON:  Chest radiograph dated 03/12/2015 FINDINGS: Endotracheal tube with tip approximately 2 cm above the carina. An enteric tube is partially visualized coursing throughout the upper abdomen. Right IJ central line with tip over central SVC. There is no pneumothorax. Linear subsegmental atelectatic changes of the left lung base. There is no focal consolidation, pleural effusion, or pneumothorax. The cardiac silhouette is within normal limits. The osseous structures are grossly unremarkable. IMPRESSION: Right IJ central line with tip over central SVC.  No pneumothorax. Electronically Signed   By: Anner Crete M.D.   On: 03/12/2015 18:55   Portable Chest Xray  03/12/2015  CLINICAL DATA:  Respiratory failure EXAM: PORTABLE CHEST 1 VIEW COMPARISON:  Portable exam 0517 hours compared to 03/11/2015 FINDINGS: Tip of endotracheal tube at carina directed into RIGHT mainstem bronchus, recommend withdrawal 2.5 cm. Normal heart size, mediastinal contours, and  pulmonary vascularity. ASD occlusion device noted. Atelectasis versus infiltrate in LEFT lower lobe. Minimal RIGHT base atelectasis. Upper lungs clear. Skin fold projects over LEFT upper lobe. No definite pleural effusion or pneumothorax. IMPRESSION: Tip of endotracheal tube at carina directed into RIGHT mainstem bronchus, recommend withdrawal 2.5 cm. Atelectasis versus consolidation LEFT lower lobe with minimal RIGHT basilar atelectasis. Findings called to [place] [facility] on 03/12/2015 at 0805 hours. Electronically Signed   By: Lavonia Dana M.D.   On: 03/12/2015 08:05   Dg Chest Portable 1 View  03/11/2015  CLINICAL DATA:  Tachycardia with hypertension. Altered mental  status. EXAM: PORTABLE CHEST 1 VIEW COMPARISON:  08/14/2014 FINDINGS: Patient is rotated towards the left on this examination. Evidence for foramen ovale closure device in the heart. Lungs are clear without airspace disease or pulmonary edema. Negative for a pneumothorax. No acute bone abnormality. Difficult to evaluate heart size due to patient rotation. IMPRESSION: No acute chest findings. Electronically Signed   By: Markus Daft M.D.   On: 03/11/2015 15:13   Dg [place]ison Bailey G Tube Plc W/fl-no Rad  03/12/2015  CLINICAL DATA:  NASO G TUBE PLACEMENT WITH FLUORO Fluoroscopy was utilized by the requesting physician.  No radiographic interpretation.   Dg Swallowing Func-speech Pathology  03/15/2015  mbs Objective Swallowing Evaluation:   MBS Patient Details Name: ERBIE MAROTTA MRN: [id number] Date of Birth: December 31, 1940 Today's Date: 03/15/2015 Time: SLP Start Time (ACUTE ONLY): 1002-SLP Stop Time (ACUTE ONLY): 1020 SLP Time Calculation (min) (ACUTE ONLY): 18 min Past Medical History: Past Medical History Diagnosis Date . Hx of transient ischemic attack (TIA)  . Hyperlipidemia  . ED (erectile dysfunction)  . Personal history of kidney stones  . Subarachnoid hemorrhage ([place])    '11- hospitalized -3 to 4 days(passed out-awoke in hospital)- no residual  affects. . Myocardial infarction ([place])    12/1991 . Dysphagia  . Hypokalemia  . Hypothyroidism  . Sleep apnea    on CPAP-settings 6 . History of kidney stones 02-27-13   x5- none recent . Throat cancer [facility])    '00-radiation therapy . Stroke ([place])  . Unspecified cerebral artery occlusion with cerebral infarction 11/08/2012 . Episodic confusion 02-27-13   hx. of, not currently(multiple episodes- ? TIA's)-always saw MD to be evaluated. Marland Kitchen AKI (acute kidney injury) ([place]) 02/23/2011   pt. denies any problems 02-27-13 . Hypertension    hx. labile - with spikes every 3-6 mos.-last 8'14 ED visit. Marland Kitchen Hypertensive encephalopathy  . Headache(784.0)    episodic with periods of confusion -multiple . Neuromuscular disorder ([place])    "parkinson's" -no tremor-sees neurology MD at [facility] . Coronary artery disease    Dr. [place] Past Surgical History: Past Surgical History Procedure Laterality Date . Angioplasty  1993   LAD . Cardiac catheterization   . Coronary angioplasty   . Basal cell cancer     head . Hernia repair   . Appendectomy   . Patent foramen ovale closure  2008 . Eye surgery   . Colonoscopy N/A 06/18/2012   Procedure: COLONOSCOPY;  Surgeon: Jeryl Columbia, MD;  Location: [facility] ENDOSCOPY;  Service: Endoscopy;  Laterality: N/A;  h&p in file-[place]  . Hot hemostasis N/A 06/18/2012   Procedure: HOT HEMOSTASIS (ARGON PLASMA COAGULATION/BICAP);  Surgeon: Jeryl Columbia, MD;  Location: [facility] ENDOSCOPY;  Service: Endoscopy;  Laterality: N/A; . Colonoscopy with propofol N/A 03/07/2013   Procedure: COLONOSCOPY WITH PROPOFOL;  Surgeon: Jeryl Columbia, MD;  Location: [facility] ENDOSCOPY;  Service: Endoscopy;  Laterality: N/A; HPI: 74 y.o. male past medical history of hypothyroidism and multiple admissions for acute encephalopathy attributed to hypertensive encephalopathy. Additional PMH includes [place] 2011, dysphagia, throat cancer, HTN, Parkinsons. MRI two small adjacent cortical ischemic infarcts measuring up to 8 mm within the high right parietal lobe near  the vertex. Intubated less than 24 hours. CXR Progression of bibasilar atelectasis. Multiple MBS, most recent 08/16/14 Dys 1 and pudding thick liquids, MBS 02/02/14 recommended Dys 1, pudding thick liquids.  No Data Recorded Assessment / Plan / Recommendation CHL IP CLINICAL IMPRESSIONS 03/15/2015 Therapy Diagnosis -- Clinical Impression Oral phase characterized by mildly delayed manipulation  and transit. Moderate-severe sensorimotor pharyngeal dysphagia revealing aspiration during the swallow with honey thick (3-5 second reflexive cough) and penetration with head in chin tuck position. Mod-max vallecular residue following puree from reduced tongue base retraction. Scan of cervical esophagus showed mild stasis (? cricopharyngeal dysfunction/prominent cricopharyngeus). Cued additional swallows assisted to decrease residue although difficulty initiating "dry" swallow. Recommend Dys1, pudding thick liquids/nothing thinner than puree), crush pills and continued ST. Pt is at chronic risk for aspiration even with these recommendations.  Impact on safety and function Severe aspiration risk   CHL IP TREATMENT RECOMMENDATION 03/15/2015 Treatment Recommendations Therapy as outlined in treatment plan below   Prognosis 03/15/2015 Prognosis for Safe Diet Advancement Fair Barriers to Reach Goals Cognitive deficits Barriers/Prognosis Comment -- CHL IP DIET RECOMMENDATION 03/15/2015 SLP Diet Recommendations Dysphagia 1 (Puree) solids;Pudding thick liquid Liquid Administration via Spoon Medication Administration Crushed with puree Compensations Slow rate;Small sips/bites;Multiple dry swallows after each bite/sip;Clear throat intermittently Postural Changes Remain semi-upright after after feeds/meals (Comment);Seated upright at 90 degrees   CHL IP OTHER RECOMMENDATIONS 03/15/2015 Recommended Consults -- Oral Care Recommendations Oral care BID Other Recommendations --   CHL IP FOLLOW UP RECOMMENDATIONS 03/15/2015 Follow up  Recommendations (No Data)   CHL IP FREQUENCY AND DURATION 03/15/2015 Speech Therapy Frequency (ACUTE ONLY) min 2x/week Treatment Duration 2 weeks      CHL IP ORAL PHASE 03/15/2015 Oral Phase Impaired Oral - Pudding Teaspoon -- Oral - Pudding Cup -- Oral - Honey Teaspoon Delayed oral transit;Reduced posterior propulsion Oral - Honey Cup NT Oral - Nectar Teaspoon -- Oral - Nectar Cup -- Oral - Nectar Straw -- Oral - Thin Teaspoon -- Oral - Thin Cup -- Oral - Thin Straw -- Oral - Puree Reduced posterior propulsion;Delayed oral transit Oral - Mech Soft -- Oral - Regular -- Oral - Multi-Consistency -- Oral - Pill -- Oral Phase - Comment --  CHL IP PHARYNGEAL PHASE 03/15/2015 Pharyngeal Phase Impaired Pharyngeal- Pudding Teaspoon -- Pharyngeal -- Pharyngeal- Pudding Cup -- Pharyngeal -- Pharyngeal- Honey Teaspoon Delayed swallow initiation-vallecula;Penetration/Aspiration during swallow;Pharyngeal residue - valleculae;Reduced tongue base retraction Pharyngeal Material enters airway, passes BELOW cords and not ejected out despite cough attempt by patient Pharyngeal- Honey Cup NT Pharyngeal -- Pharyngeal- Nectar Teaspoon -- Pharyngeal -- Pharyngeal- Nectar Cup -- Pharyngeal -- Pharyngeal- Nectar Straw -- Pharyngeal -- Pharyngeal- Thin Teaspoon -- Pharyngeal -- Pharyngeal- Thin Cup -- Pharyngeal -- Pharyngeal- Thin Straw -- Pharyngeal -- Pharyngeal- Puree Delayed swallow initiation-vallecula;Pharyngeal residue - valleculae;Reduced tongue base retraction Pharyngeal -- Pharyngeal- Mechanical Soft -- Pharyngeal -- Pharyngeal- Regular -- Pharyngeal -- Pharyngeal- Multi-consistency -- Pharyngeal -- Pharyngeal- Pill -- Pharyngeal -- Pharyngeal Comment --  CHL IP CERVICAL ESOPHAGEAL PHASE 03/15/2015 Cervical Esophageal Phase Impaired Pudding Teaspoon -- Pudding Cup -- Honey Teaspoon -- Honey Cup -- Nectar Teaspoon -- Nectar Cup -- Nectar Straw -- Thin Teaspoon -- Thin Cup -- Thin Straw -- Puree -- Mechanical Soft -- Regular --  Multi-consistency -- Pill -- Cervical Esophageal Comment -- [name] 03/15/2015, 11:09 AM [name] [phone]               Microbiology: Recent Results (from the past 240 hour(s))  Blood culture (routine x 2)     Status: None   Collection Time: 03/11/15  3:00 PM  Result Value Ref Range Status   Specimen Description BLOOD RIGHT ANTECUBITAL  Final   Special Requests BOTTLES DRAWN AEROBIC AND ANAEROBIC 10CC  Final   Culture NO GROWTH 5 DAYS  Final  Report Status 03/16/2015 FINAL  Final  Blood culture (routine x 2)     Status: None   Collection Time: 03/11/15  3:05 PM  Result Value Ref Range Status   Specimen Description BLOOD RIGHT ARM  Final   Special Requests IN PEDIATRIC BOTTLE 4CC  Final   Culture NO GROWTH 5 DAYS  Final   Report Status 03/16/2015 FINAL  Final  Urine culture     Status: None   Collection Time: 03/11/15  3:32 PM  Result Value Ref Range Status   Specimen Description URINE, CATHETERIZED  Final   Special Requests NONE  Final   Culture NO GROWTH 2 DAYS  Final   Report Status 03/13/2015 FINAL  Final  MRSA PCR Screening     Status: None   Collection Time: 03/11/15  9:47 PM  Result Value Ref Range Status   MRSA by PCR NEGATIVE NEGATIVE Final    Comment:        The GeneXpert MRSA Assay (FDA approved for NASAL specimens only), is one component of a comprehensive MRSA colonization surveillance program. It is not intended to diagnose MRSA infection nor to guide or monitor treatment for MRSA infections.      Labs: Basic Metabolic Panel:  Recent Labs Lab 03/13/15 0330 03/13/15 1200 03/14/15 0530 03/14/15 2350 03/15/15 0435 03/17/15 0548  NA 138 143 141 144 144 146*  K 2.9* 3.3* 2.8* 4.1 3.5 3.3*  CL 108 109 106 108 108 110  CO2 21* 25 27 27 27 26   GLUCOSE 154* 94 168* 105* 95 108*  BUN 17 12 9 9 12 19   CREATININE 1.17 0.95 0.86 0.90 0.98 0.94  CALCIUM 7.6* 8.0* 8.0* 8.5* 8.6* 8.6*  MG 1.6* 2.3 2.2  --  2.3  --    PHOS <1.0* 1.7* 2.1*  --  2.7  --    Liver Function Tests:  Recent Labs Lab 03/11/15 1510 03/17/15 0548  AST 43* 26  ALT 23 29  ALKPHOS 90 55  BILITOT 1.1 0.4  PROT 8.0 5.6*  ALBUMIN 4.3 2.8*   No results for input(s): LIPASE, AMYLASE in the last 168 hours. No results for input(s): AMMONIA in the last 168 hours. CBC:  Recent Labs Lab 03/11/15 1510 03/11/15 2144 03/12/15 0243 03/12/15 0715 03/13/15 0330 03/14/15 0530 03/15/15 0435 03/17/15 0548  WBC 16.8* 13.5* 17.0* 19.6* 15.8* 12.0* 9.7 7.4  NEUTROABS 15.1* 12.1* 15.2*  --   --   --   --   --   HGB 18.0* 16.4 15.1 13.7 12.6* 13.1 13.4 13.0  HCT 50.3 48.0 43.4 40.0 35.3* 37.6* 40.6 40.2  MCV 95.6 96.0 95.4 98.3 95.1 96.9 100.0 101.0*  PLT 228 169 190 163 195 135* 166 163   Cardiac Enzymes:  Recent Labs Lab 03/12/15 0243 03/12/15 2014  TROPONINI 0.14* 0.07*   BNP: BNP (last 3 results) No results for input(s): BNP in the last 8760 hours.  ProBNP (last 3 results) No results for input(s): PROBNP in the last 8760 hours.  CBG:  Recent Labs Lab 03/14/15 2305 03/15/15 0435 03/15/15 0725 03/15/15 1141 03/15/15 1614  GLUCAP 100* 95 92 96 111*       Signed:  Kelvin Cellar MD  FACP  [facility] 03/18/2015, 1:44 PM

## 2015-03-18 NOTE — Progress Notes (Signed)
Notified Dr. Coralyn Pear of patient's incontinent episodes of loose stools today.

## 2015-03-18 NOTE — Progress Notes (Signed)
NURSING PROGRESS NOTE  [place] [id number] Discharge Data: 03/18/2015 2:15 PM Attending Provider: Kelvin Cellar, MD PCP:No primary care provider on file.     Vanetta Shawl to be D/C'd Home per MD order.  Discussed with the patient the After Visit Summary and all questions fully answered. All IV's discontinued with no bleeding noted. All belongings returned to patient for patient to take home.   Last Vital Signs:  Blood pressure 121/75, pulse 77, temperature 97.8 F (36.6 C), temperature source Oral, resp. rate 20, height 6' (1.829 m), weight 75.6 kg (166 lb 10.7 oz), SpO2 99 %.  Discharge Medication List   Medication List    STOP taking these medications        aspirin 325 MG tablet     doxycycline 100 MG tablet  Commonly known as:  VIBRA-TABS      TAKE these medications        ALPRAZolam 0.25 MG tablet  Commonly known as:  XANAX  Take 0.25 mg by mouth daily as needed for anxiety (combative).     atorvastatin 20 MG tablet  Commonly known as:  LIPITOR  Take 1 tablet (20 mg total) by mouth daily at 6 PM.     CARNATION BREAKFAST ESSENTIALS PO  Take 1 Bottle by mouth daily.     ENSURE  Take 237 mLs by mouth daily.     chlorthalidone 25 MG tablet  Commonly known as:  HYGROTON  Take 25 mg by mouth daily.     cloNIDine 0.1 MG tablet  Commonly known as:  CATAPRES  Take 0.1 mg by mouth as needed (high blood pressure).     clopidogrel 75 MG tablet  Commonly known as:  PLAVIX  Take 1 tablet (75 mg total) by mouth daily.     fish oil-omega-3 fatty acids 1000 MG capsule  Take 1 g by mouth daily.     GLUCOSAMINE HCL PO  Take 1 tablet by mouth daily.     KLOR-CON M10 10 MEQ tablet  Generic drug:  potassium chloride  Take 10 mEq by mouth daily.     labetalol 200 MG tablet  Commonly known as:  NORMODYNE  Take 200 mg by mouth 2 (two) times daily.     levothyroxine 112 MCG tablet  Commonly known as:  SYNTHROID, LEVOTHROID  Take 112 mcg by mouth daily.     multivitamin with minerals Tabs tablet  Take 1 tablet by mouth daily.     promethazine 12.5 MG suppository  Commonly known as:  PHENERGAN  Place 1 suppository (12.5 mg total) rectally every 8 (eight) hours as needed for nausea or vomiting.     RESOURCE THICKENUP CLEAR Powd  Take 1 g by mouth as needed.     testosterone cypionate 200 MG/ML injection  Commonly known as:  DEPOTESTOSTERONE CYPIONATE  Inject 1 mL (200 mg total) into the muscle every 28 (twenty-eight) days.     VITAMIN C PO  Take 1 tablet by mouth daily.         Doristine Devoid, RN

## 2015-04-15 ENCOUNTER — Encounter (HOSPITAL_COMMUNITY): Payer: Self-pay | Admitting: *Deleted

## 2015-04-15 ENCOUNTER — Emergency Department (HOSPITAL_COMMUNITY): Payer: Medicare HMO

## 2015-04-15 ENCOUNTER — Observation Stay (HOSPITAL_COMMUNITY)
Admission: EM | Admit: 2015-04-15 | Discharge: 2015-04-16 | Disposition: A | Discharge status: Home / Self Care | Payer: Medicare HMO | Attending: Internal Medicine | Admitting: Internal Medicine

## 2015-04-15 DIAGNOSIS — G709 Myoneural disorder, unspecified: Secondary | ICD-10-CM | POA: Insufficient documentation

## 2015-04-15 DIAGNOSIS — R55 Syncope and collapse: Secondary | ICD-10-CM | POA: Diagnosis not present

## 2015-04-15 DIAGNOSIS — Z87442 Personal history of urinary calculi: Secondary | ICD-10-CM | POA: Diagnosis not present

## 2015-04-15 DIAGNOSIS — E039 Hypothyroidism, unspecified: Secondary | ICD-10-CM | POA: Diagnosis not present

## 2015-04-15 DIAGNOSIS — E785 Hyperlipidemia, unspecified: Secondary | ICD-10-CM | POA: Diagnosis not present

## 2015-04-15 DIAGNOSIS — R42 Dizziness and giddiness: Secondary | ICD-10-CM | POA: Insufficient documentation

## 2015-04-15 DIAGNOSIS — E46 Unspecified protein-calorie malnutrition: Secondary | ICD-10-CM

## 2015-04-15 DIAGNOSIS — R110 Nausea: Secondary | ICD-10-CM | POA: Diagnosis not present

## 2015-04-15 DIAGNOSIS — N179 Acute kidney failure, unspecified: Secondary | ICD-10-CM | POA: Insufficient documentation

## 2015-04-15 DIAGNOSIS — Z9889 Other specified postprocedural states: Secondary | ICD-10-CM | POA: Insufficient documentation

## 2015-04-15 DIAGNOSIS — Z87891 Personal history of nicotine dependence: Secondary | ICD-10-CM | POA: Insufficient documentation

## 2015-04-15 DIAGNOSIS — I10 Essential (primary) hypertension: Secondary | ICD-10-CM | POA: Diagnosis not present

## 2015-04-15 DIAGNOSIS — R05 Cough: Secondary | ICD-10-CM | POA: Insufficient documentation

## 2015-04-15 DIAGNOSIS — I2510 Atherosclerotic heart disease of native coronary artery without angina pectoris: Secondary | ICD-10-CM | POA: Insufficient documentation

## 2015-04-15 DIAGNOSIS — Z7982 Long term (current) use of aspirin: Secondary | ICD-10-CM | POA: Diagnosis not present

## 2015-04-15 DIAGNOSIS — I6350 Cerebral infarction due to unspecified occlusion or stenosis of unspecified cerebral artery: Secondary | ICD-10-CM | POA: Insufficient documentation

## 2015-04-15 DIAGNOSIS — N529 Male erectile dysfunction, unspecified: Secondary | ICD-10-CM | POA: Insufficient documentation

## 2015-04-15 DIAGNOSIS — G4730 Sleep apnea, unspecified: Secondary | ICD-10-CM | POA: Diagnosis not present

## 2015-04-15 DIAGNOSIS — I252 Old myocardial infarction: Secondary | ICD-10-CM | POA: Insufficient documentation

## 2015-04-15 DIAGNOSIS — I674 Hypertensive encephalopathy: Secondary | ICD-10-CM | POA: Insufficient documentation

## 2015-04-15 DIAGNOSIS — I63321 Cerebral infarction due to thrombosis of right anterior cerebral artery: Secondary | ICD-10-CM

## 2015-04-15 DIAGNOSIS — E876 Hypokalemia: Secondary | ICD-10-CM | POA: Diagnosis not present

## 2015-04-15 DIAGNOSIS — R231 Pallor: Secondary | ICD-10-CM | POA: Insufficient documentation

## 2015-04-15 DIAGNOSIS — Z8673 Personal history of transient ischemic attack (TIA), and cerebral infarction without residual deficits: Secondary | ICD-10-CM | POA: Diagnosis not present

## 2015-04-15 DIAGNOSIS — Z85818 Personal history of malignant neoplasm of other sites of lip, oral cavity, and pharynx: Secondary | ICD-10-CM | POA: Insufficient documentation

## 2015-04-15 DIAGNOSIS — I951 Orthostatic hypotension: Secondary | ICD-10-CM | POA: Insufficient documentation

## 2015-04-15 LAB — URINALYSIS, ROUTINE W REFLEX MICROSCOPIC
Bilirubin Urine: NEGATIVE
GLUCOSE, UA: NEGATIVE mg/dL
HGB URINE DIPSTICK: NEGATIVE
KETONES UR: NEGATIVE mg/dL
LEUKOCYTES UA: NEGATIVE
Nitrite: NEGATIVE
PROTEIN: NEGATIVE mg/dL
Specific Gravity, Urine: 1.016 (ref 1.005–1.030)
pH: 7.5 (ref 5.0–8.0)

## 2015-04-15 LAB — CBC
HCT: 39.4 % (ref 39.0–52.0)
HEMATOCRIT: 39.2 % (ref 39.0–52.0)
HEMOGLOBIN: 13.5 g/dL (ref 13.0–17.0)
Hemoglobin: 13 g/dL (ref 13.0–17.0)
MCH: 32.4 pg (ref 26.0–34.0)
MCH: 33.8 pg (ref 26.0–34.0)
MCHC: 33 g/dL (ref 30.0–36.0)
MCHC: 34.4 g/dL (ref 30.0–36.0)
MCV: 98.3 fL (ref 78.0–100.0)
MCV: 98.2 fL (ref 78.0–100.0)
PLATELETS: 117 10*3/uL — AB (ref 150–400)
Platelets: 155 10*3/uL (ref 150–400)
RBC: 4.01 MIL/uL — AB (ref 4.22–5.81)
RBC: 3.99 MIL/uL — ABNORMAL LOW (ref 4.22–5.81)
RDW: 13.1 % (ref 11.5–15.5)
RDW: 13 % (ref 11.5–15.5)
WBC: 8.3 10*3/uL (ref 4.0–10.5)
WBC: 11.6 10*3/uL — ABNORMAL HIGH (ref 4.0–10.5)

## 2015-04-15 LAB — BASIC METABOLIC PANEL
Anion gap: 8 (ref 5–15)
BUN: 19 mg/dL (ref 6–20)
CALCIUM: 9.2 mg/dL (ref 8.9–10.3)
CO2: 31 mmol/L (ref 22–32)
CREATININE: 1.24 mg/dL (ref 0.61–1.24)
Chloride: 104 mmol/L (ref 101–111)
GFR calc non Af Amer: 56 mL/min — ABNORMAL LOW (ref >60)
Glucose, Bld: 99 mg/dL (ref 65–99)
Potassium: 3.5 mmol/L (ref 3.5–5.1)
SODIUM: 143 mmol/L (ref 135–145)

## 2015-04-15 LAB — CREATININE, SERUM
Creatinine, Ser: 1.18 mg/dL (ref 0.61–1.24)
GFR calc Af Amer: >60 mL/min (ref >60)
GFR calc non Af Amer: 59 mL/min — ABNORMAL LOW (ref >60)

## 2015-04-15 LAB — TROPONIN I

## 2015-04-15 MED ORDER — CLONIDINE HCL 0.1 MG PO TABS: 0.1000 mg | ORAL_TABLET | ORAL | Status: DC | PRN

## 2015-04-15 MED ORDER — LABETALOL HCL 200 MG PO TABS
200.0000 mg | ORAL_TABLET | Freq: Two times a day (BID) | ORAL | Status: DC
  Administered 2015-04-15: 200 mg via ORAL
  Filled 2015-04-15: qty 1

## 2015-04-15 MED ORDER — CETYLPYRIDINIUM CHLORIDE 0.05 % MT LIQD
7.0000 mL | Freq: Two times a day (BID) | OROMUCOSAL | Status: DC
  Administered 2015-04-15 – 2015-04-16 (×2): 7 mL via OROMUCOSAL

## 2015-04-15 MED ORDER — STARCH (THICKENING) PO POWD
ORAL | Status: DC | PRN
  Administered 2015-04-15: 22:00:00 via ORAL
  Filled 2015-04-15: qty 227

## 2015-04-15 MED ORDER — ENOXAPARIN SODIUM 30 MG/0.3ML ~~LOC~~ SOLN
30.0000 mg | SUBCUTANEOUS | Status: DC
Start: 2015-04-15 — End: 2015-04-16
  Administered 2015-04-15: 30 mg via SUBCUTANEOUS
  Filled 2015-04-15: qty 0.3

## 2015-04-15 MED ORDER — OMEGA-3 FATTY ACIDS 1000 MG PO CAPS: 1.0000 g | ORAL_CAPSULE | Freq: Every day | ORAL | Status: DC

## 2015-04-15 MED ORDER — PRAVASTATIN SODIUM 10 MG PO TABS
10.0000 mg | ORAL_TABLET | Freq: Every day | ORAL | Status: DC
  Filled 2015-04-15: qty 1

## 2015-04-15 MED ORDER — SODIUM CHLORIDE 0.9 % IV SOLN: 250.0000 mL | INTRAVENOUS | Status: DC | PRN

## 2015-04-15 MED ORDER — SODIUM CHLORIDE 0.9% FLUSH: 3.0000 mL | INTRAVENOUS | Status: DC | PRN

## 2015-04-15 MED ORDER — SODIUM CHLORIDE 0.9% FLUSH
3.0000 mL | Freq: Two times a day (BID) | INTRAVENOUS | Status: DC
  Administered 2015-04-16: 3 mL via INTRAVENOUS

## 2015-04-15 MED ORDER — ADULT MULTIVITAMIN W/MINERALS CH
1.0000 | ORAL_TABLET | Freq: Every day | ORAL | Status: DC
  Administered 2015-04-16: 1 via ORAL
  Filled 2015-04-15: qty 1

## 2015-04-15 MED ORDER — LEVOTHYROXINE SODIUM 112 MCG PO TABS
112.0000 ug | ORAL_TABLET | Freq: Every day | ORAL | Status: DC
  Administered 2015-04-16: 112 ug via ORAL
  Filled 2015-04-15: qty 1

## 2015-04-15 MED ORDER — ENSURE ENLIVE PO LIQD
237.0000 mL | Freq: Two times a day (BID) | ORAL | Status: DC
  Administered 2015-04-16: 237 mL via ORAL

## 2015-04-15 MED ORDER — ZOLPIDEM TARTRATE 5 MG PO TABS: 5.0000 mg | ORAL_TABLET | Freq: Every evening | ORAL | Status: DC | PRN

## 2015-04-15 MED ORDER — ASPIRIN 325 MG PO TABS: 325.0000 mg | ORAL_TABLET | Freq: Every day | ORAL | Status: DC

## 2015-04-15 MED ORDER — SODIUM CHLORIDE 0.9% FLUSH: 3.0000 mL | Freq: Two times a day (BID) | INTRAVENOUS | Status: DC

## 2015-04-15 MED ORDER — ONDANSETRON HCL 4 MG PO TABS: 4.0000 mg | ORAL_TABLET | Freq: Four times a day (QID) | ORAL | Status: DC | PRN

## 2015-04-15 MED ORDER — OMEGA-3-ACID ETHYL ESTERS 1 G PO CAPS
1.0000 g | ORAL_CAPSULE | Freq: Every day | ORAL | Status: DC
  Administered 2015-04-16: 1 g via ORAL
  Filled 2015-04-15: qty 1

## 2015-04-15 MED ORDER — ONDANSETRON HCL 4 MG/2ML IJ SOLN: 4.0000 mg | Freq: Four times a day (QID) | INTRAMUSCULAR | Status: DC | PRN

## 2015-04-15 MED ORDER — POTASSIUM CHLORIDE CRYS ER 10 MEQ PO TBCR
10.0000 meq | EXTENDED_RELEASE_TABLET | Freq: Every day | ORAL | Status: DC
  Administered 2015-04-16: 10 meq via ORAL
  Filled 2015-04-15: qty 1

## 2015-04-15 NOTE — ED Notes (Signed)
Pt presents via [facility] from home after a syncopal episode.  Pt was dizzy while standing, sat down and had a syncopal episode lasting ~1 min, witnessed by family.  Initially 80s sys, pt layed flat and hyoptension resolved.  Hx: MI, stroke.  Per wife pt has had 4 similar episodes since his stroke x 1 month ago.  EKG NSR occasional PVS.  BP-140/96 P-62 O2-96%RA.  Pt denies nausea/dizziness on arrival.  18 g LAC.  A x 4, NAD.

## 2015-04-15 NOTE — ED Notes (Signed)
Pt orthostatic vitals abnormal, Hayley, RN made aware. Pt had no complaint of dizziness during standing during ortho vs.

## 2015-04-15 NOTE — ED Notes (Signed)
MD at bedside. 

## 2015-04-15 NOTE — ED Provider Notes (Signed)
CSN: [id number]     Arrival date & time 04/15/15  1424 History   None    Chief Complaint  Patient presents with  . Loss of Consciousness     (Consider location/radiation/quality/duration/timing/severity/associated sxs/prior Treatment) Patient is a 75 y.o. male presenting with syncope. The history is provided by the patient and the EMS personnel.  Loss of Consciousness Episode history:  Multiple Most recent episode:  Today Duration: less than 3 min. Timing:  Intermittent Progression:  Resolved Chronicity:  New Context: normal activity   Context comment:  Ambulating to bathroom Witnessed: yes   Relieved by:  Lying down Exacerbated by: standing up. Ineffective treatments:  None tried Associated symptoms: dizziness and nausea   Associated symptoms: no chest pain, no difficulty breathing, no fever, no focal weakness, no headaches, no palpitations, no rectal bleeding, no shortness of breath and no vomiting     Past Medical History  Diagnosis Date  . Hx of transient ischemic attack (TIA)   . Hyperlipidemia   . ED (erectile dysfunction)   . Personal history of kidney stones   . Subarachnoid hemorrhage ([place])     '11- hospitalized -3 to 4 days(passed out-awoke in hospital)- no residual affects.  . Myocardial infarction ([place])     12/1991  . Dysphagia   . Hypokalemia   . Hypothyroidism   . Sleep apnea     on CPAP-settings 6  . History of kidney stones 02-27-13    x5- none recent  . Throat cancer [facility])     '00-radiation therapy  . Stroke ([place])   . Unspecified cerebral artery occlusion with cerebral infarction 11/08/2012  . Episodic confusion 02-27-13    hx. of, not currently(multiple episodes- ? TIA's)-always saw MD to be evaluated.  Marland Kitchen AKI (acute kidney injury) ([place]) 02/23/2011    pt. denies any problems 02-27-13  . Hypertension     hx. labile - with spikes every 3-6 mos.-last 8'14 ED visit.  Marland Kitchen Hypertensive encephalopathy   . Headache(784.0)     episodic with periods of  confusion -multiple  . Neuromuscular disorder ([place])     "parkinson's" -no tremor-sees neurology MD at [facility]  . Coronary artery disease     Dr. [place]   Past Surgical History  Procedure Laterality Date  . Angioplasty  1993    LAD  . Cardiac catheterization    . Coronary angioplasty    . Basal cell cancer      head  . Hernia repair    . Appendectomy    . Patent foramen ovale closure  2008  . Eye surgery    . Colonoscopy N/A 06/18/2012    Procedure: COLONOSCOPY;  Surgeon: Jeryl Columbia, MD;  Location: [facility] ENDOSCOPY;  Service: Endoscopy;  Laterality: N/A;  h&p in file-[place]   . Hot hemostasis N/A 06/18/2012    Procedure: HOT HEMOSTASIS (ARGON PLASMA COAGULATION/BICAP);  Surgeon: Jeryl Columbia, MD;  Location: [facility] ENDOSCOPY;  Service: Endoscopy;  Laterality: N/A;  . Colonoscopy with propofol N/A 03/07/2013    Procedure: COLONOSCOPY WITH PROPOFOL;  Surgeon: Jeryl Columbia, MD;  Location: [facility] ENDOSCOPY;  Service: Endoscopy;  Laterality: N/A;   Family History  Problem Relation Age of Onset  . Heart failure Father   . Heart attack Brother   . Anemia Brother 54    mi   Social History  Substance Use Topics  . Smoking status: Former Smoker    Types: Cigarettes    Quit date: 09/21/1990  . Smokeless tobacco: Never Used  .  Alcohol Use: No    Review of Systems  Constitutional: Negative for fever.  HENT: Negative.   Eyes: Negative for visual disturbance.  Respiratory: Positive for cough. Negative for shortness of breath.   Cardiovascular: Positive for syncope. Negative for chest pain and palpitations.  Gastrointestinal: Positive for nausea. Negative for vomiting and diarrhea.  Genitourinary: Negative.   Musculoskeletal: Negative.   Skin: Negative for pallor, rash and wound.  Neurological: Positive for dizziness and light-headedness. Negative for focal weakness and headaches.  All other systems reviewed and are negative.     Allergies  Review of patient's allergies indicates no known  allergies.  Home Medications   Prior to Admission medications   Medication Sig Start Date End Date Taking? Authorizing Provider  Ascorbic Acid (VITAMIN C PO) Take 1 tablet by mouth daily.   Yes Historical Provider, MD  aspirin 325 MG tablet Take 325 mg by mouth daily.   Yes Historical Provider, MD  chlorthalidone (HYGROTON) 25 MG tablet Take 25 mg by mouth daily. 05/28/14  Yes Historical Provider, MD  cloNIDine (CATAPRES) 0.1 MG tablet Take 0.1 mg by mouth as needed (high blood pressure).  11/29/13  Yes Historical Provider, MD  ENSURE (ENSURE) Take 237 mLs by mouth daily.   Yes Historical Provider, MD  fish oil-omega-3 fatty acids 1000 MG capsule Take 1 g by mouth daily.    Yes Historical Provider, MD  KLOR-CON M10 10 MEQ tablet Take 10 mEq by mouth daily. 05/12/14  Yes Historical Provider, MD  labetalol (NORMODYNE) 200 MG tablet Take 200 mg by mouth 2 (two) times daily. 01/20/15  Yes Historical Provider, MD  levothyroxine (SYNTHROID, LEVOTHROID) 112 MCG tablet Take 112 mcg by mouth daily.  12/11/13  Yes Historical Provider, MD  lovastatin (MEVACOR) 20 MG tablet Take 20 mg by mouth 2 (two) times daily.   Yes Historical Provider, MD  Maltodextrin-Xanthan Gum (RESOURCE THICKENUP CLEAR) POWD Take 1 g by mouth as needed. 02/03/14  Yes Marianne L York, PA-C  Multiple Vitamin (MULITIVITAMIN WITH MINERALS) TABS Take 1 tablet by mouth daily.     Yes Historical Provider, MD  atorvastatin (LIPITOR) 20 MG tablet Take 1 tablet (20 mg total) by mouth daily at 6 PM. Patient not taking: Reported on 04/15/2015 03/18/15   Kelvin Cellar, MD  clopidogrel (PLAVIX) 75 MG tablet Take 1 tablet (75 mg total) by mouth daily. Patient not taking: Reported on 04/15/2015 03/18/15   Kelvin Cellar, MD  GLUCOSAMINE HCL PO Take 1 tablet by mouth daily. Reported on 04/15/2015    Historical Provider, MD  promethazine (PHENERGAN) 12.5 MG suppository Place 1 suppository (12.5 mg total) rectally every 8 (eight) hours as needed for  nausea or vomiting. Patient not taking: Reported on 04/15/2015 03/18/15   Kelvin Cellar, MD  testosterone cypionate (DEPOTESTOTERONE CYPIONATE) 200 MG/ML injection Inject 1 mL (200 mg total) into the muscle every 28 (twenty-eight) days. Patient not taking: Reported on 03/11/2015 11/20/12   Blanchie Serve, MD   BP 149/101 mmHg  Pulse 75  Temp(Src) 97.8 F (36.6 C) (Oral)  Resp 13  Ht 6' (1.829 m)  Wt 68.539 kg  BMI 20.49 kg/m2  SpO2 96% Physical Exam  Constitutional: He is oriented to person, place, and time. He appears well-developed and well-nourished. No distress.  HENT:  Head: Normocephalic and atraumatic.  Mouth/Throat: Oropharynx is clear and moist. No oropharyngeal exudate.  Eyes: Pupils are equal, round, and reactive to light. No scleral icterus.  Neck: Normal range of motion. Neck supple.  Cardiovascular:  Normal rate, regular rhythm, normal heart sounds and intact distal pulses.  Exam reveals no gallop and no friction rub.   No murmur heard. Pulmonary/Chest: Effort normal and breath sounds normal. No respiratory distress. He has no wheezes. He exhibits no tenderness.  rhonchi throughout, worse on left  Abdominal: Soft. He exhibits no distension. There is no tenderness. There is no rebound and no guarding.  Musculoskeletal: Normal range of motion. He exhibits no edema or tenderness.  Neurological: He is alert and oriented to person, place, and time. No cranial nerve deficit. He exhibits normal muscle tone. Coordination normal.  Skin: Skin is warm and dry. No rash noted. He is not diaphoretic. No erythema. There is pallor.  Psychiatric: He has a normal mood and affect.  Nursing note and vitals reviewed.   ED Course  Procedures (including critical care time) Labs Review Labs Reviewed  BASIC METABOLIC PANEL - Abnormal; Notable for the following:    GFR calc non Af Amer 56 (*)    All other components within normal limits  CBC - Abnormal; Notable for the following:    RBC  4.01 (*)    Platelets 117 (*)    All other components within normal limits  CBC - Abnormal; Notable for the following:    WBC 11.6 (*)    RBC 3.99 (*)    All other components within normal limits  CREATININE, SERUM - Abnormal; Notable for the following:    GFR calc non Af Amer 59 (*)    All other components within normal limits  URINALYSIS, ROUTINE W REFLEX MICROSCOPIC (NOT AT [facility])  TSH  TROPONIN I  TROPONIN I  TROPONIN I  CBG MONITORING, ED  Randolm Idol, ED    Imaging Review Dg Chest 2 View  04/15/2015  CLINICAL DATA:  Syncopal episode today.  History of MI, stroke. EXAM: CHEST  2 VIEW COMPARISON:  Chest x-ray dated 04/07/2015. FINDINGS: Heart size is upper normal, unchanged. Overall cardiomediastinal silhouette is stable in size and configuration. Septal defect occluded device is stable in position overlying the right heart. There is mild chronic scarring/ fibrosis bilaterally, unchanged. Lungs otherwise clear. No evidence of pneumonia. No pleural effusion seen. No pneumothorax seen. Slight elevation/ eventration of the right hemidiaphragm is stable. Osseous and soft tissue structures about the chest are otherwise unremarkable. IMPRESSION: Stable chest x-ray. No evidence of acute cardiopulmonary abnormality. Electronically Signed   By: Franki Cabot M.D.   On: 04/15/2015 15:45   I have personally reviewed and evaluated these images and lab results as part of my medical decision-making.   EKG Interpretation   Date/Time:  Thursday April 15 2015 14:53:59 EST Ventricular Rate:  65 PR Interval:  204 QRS Duration: 96 QT Interval:  404 QTC Calculation: 420 R Axis:   82 Text Interpretation:  Sinus rhythm Borderline right axis deviation  Baseline wander in lead(s) V3 No significant change since last tracing  Confirmed by Maryan Rued  MD, Loree Fee ([id number]) on 04/15/2015 5:55:12 PM      MDM   Final diagnoses:  Syncope, unspecified syncope type    Patient is a 75 year old male  who presents after a syncopal episode at home today. Per family patient has had multiple episodes of syncope over the past month after a stroke. He does report presyncopal symptoms prior to this but otherwise denies any pain. Further history and exam as above notable for stable vital signs and reassuring physical exam. Patient profoundly orthostatic here.labs and ekg reassuring. Patient will be admitted to  medicine for further management and evaluation.    Heriberto Antigua, MD 04/16/15 1200  Blanchie Dessert, MD 04/17/15 [phone]

## 2015-04-15 NOTE — H&P (Signed)
[facility]                                                                                    Patient Demographics  Benjamin Park, is a 75 y.o. male  CSN: [id number]  MRN: [id number]  DOB - [birth year]  Admit Date - 04/15/2015  Outpatient Primary MD for the patient is No primary care provider on file.   With History of -  Past Medical History  Diagnosis Date  . Hx of transient ischemic attack (TIA)   . Hyperlipidemia   . ED (erectile dysfunction)   . Personal history of kidney stones   . Subarachnoid hemorrhage ([place])     '11- hospitalized -3 to 4 days(passed out-awoke in hospital)- no residual affects.  . Myocardial infarction ([place])     12/1991  . Dysphagia   . Hypokalemia   . Hypothyroidism   . Sleep apnea     on CPAP-settings 6  . History of kidney stones 02-27-13    x5- none recent  . Throat cancer [facility])     '00-radiation therapy  . Stroke ([place])   . Unspecified cerebral artery occlusion with cerebral infarction 11/08/2012  . Episodic confusion 02-27-13    hx. of, not currently(multiple episodes- ? TIA's)-always saw MD to be evaluated.  Marland Kitchen AKI (acute kidney injury) ([place]) 02/23/2011    pt. denies any problems 02-27-13  . Hypertension     hx. labile - with spikes every 3-6 mos.-last 8'14 ED visit.  Marland Kitchen Hypertensive encephalopathy   . Headache(784.0)     episodic with periods of confusion -multiple  . Neuromuscular disorder ([place])     "parkinson's" -no tremor-sees neurology MD at [facility]  . Coronary artery disease     Dr. [place]      Past Surgical History  Procedure Laterality Date  . Angioplasty  1993    LAD  . Cardiac catheterization    . Coronary angioplasty    . Basal cell cancer      head  . Hernia repair    . Appendectomy    . Patent foramen ovale closure  2008  . Eye surgery    . Colonoscopy N/A 06/18/2012    Procedure: COLONOSCOPY;  Surgeon: Jeryl Columbia, MD;  Location: [facility] ENDOSCOPY;  Service: Endoscopy;  Laterality: N/A;  h&p in  file-[place]   . Hot hemostasis N/A 06/18/2012    Procedure: HOT HEMOSTASIS (ARGON PLASMA COAGULATION/BICAP);  Surgeon: Jeryl Columbia, MD;  Location: [facility] ENDOSCOPY;  Service: Endoscopy;  Laterality: N/A;  . Colonoscopy with propofol N/A 03/07/2013    Procedure: COLONOSCOPY WITH PROPOFOL;  Surgeon: Jeryl Columbia, MD;  Location: [facility] ENDOSCOPY;  Service: Endoscopy;  Laterality: N/A;    in for   Chief Complaint  Patient presents with  . Loss of Consciousness     HPI  Benjamin Park  is a 75 y.o. male, her past medical history significant for hypertension, TIA, hyperlipidemia and separate nose hemorrhage was recently discharged from the hospital on 03/18/2015 after an admission for hypertensive encephalopathy, and acute encephalopathy . Patient denies any chest pains, shortness of breath nausea and vomiting at this time but had an episode  of vomiting earlier.. The episode was described by his wife who was at bedside as loss of consciousness for around 3 minutes with no reported tonic-clonic activity. His wife reports similar episodes lately and history of multiple falls. Patient had workup during his last hospitalization which included an echocardiogram and carotid Dopplers. In the emergency room, the patient was noted to have orthostatic hypotension.    Review of Systems    In addition to the HPI above,  No Fever-chills, No Headache, No changes with Vision or hearing, No problems swallowing food or Liquids, No Chest pain, Cough or Shortness of Breath, No Abdominal pain,  Bowel movements are regular, No Blood in stool or Urine, No dysuria, No new skin rashes or bruises, No new joints pains-aches,  No new weakness, tingling, numbness in any extremity, No recent weight gain or loss, No polyuria, polydypsia or polyphagia, No significant Mental Stressors.  A full 10 point Review of Systems was done, except as stated above, all other Review of Systems were negative.   Social History Social  History  Substance Use Topics  . Smoking status: Former Smoker    Types: Cigarettes    Quit date: 09/21/1990  . Smokeless tobacco: Never Used  . Alcohol Use: No    Family History Family History  Problem Relation Age of Onset  . Heart failure Father   . Heart attack Brother   . Anemia Brother 81    mi     Prior to Admission medications   Medication Sig Start Date End Date Taking? Authorizing Provider  Ascorbic Acid (VITAMIN C PO) Take 1 tablet by mouth daily.   Yes Historical Provider, MD  aspirin 325 MG tablet Take 325 mg by mouth daily.   Yes Historical Provider, MD  chlorthalidone (HYGROTON) 25 MG tablet Take 25 mg by mouth daily. 05/28/14  Yes Historical Provider, MD  cloNIDine (CATAPRES) 0.1 MG tablet Take 0.1 mg by mouth as needed (high blood pressure).  11/29/13  Yes Historical Provider, MD  ENSURE (ENSURE) Take 237 mLs by mouth daily.   Yes Historical Provider, MD  fish oil-omega-3 fatty acids 1000 MG capsule Take 1 g by mouth daily.    Yes Historical Provider, MD  KLOR-CON M10 10 MEQ tablet Take 10 mEq by mouth daily. 05/12/14  Yes Historical Provider, MD  labetalol (NORMODYNE) 200 MG tablet Take 200 mg by mouth 2 (two) times daily. 01/20/15  Yes Historical Provider, MD  levothyroxine (SYNTHROID, LEVOTHROID) 112 MCG tablet Take 112 mcg by mouth daily.  12/11/13  Yes Historical Provider, MD  lovastatin (MEVACOR) 20 MG tablet Take 20 mg by mouth 2 (two) times daily.   Yes Historical Provider, MD  Maltodextrin-Xanthan Gum (RESOURCE THICKENUP CLEAR) POWD Take 1 g by mouth as needed. 02/03/14  Yes Marianne L York, PA-C  Multiple Vitamin (MULITIVITAMIN WITH MINERALS) TABS Take 1 tablet by mouth daily.     Yes Historical Provider, MD  atorvastatin (LIPITOR) 20 MG tablet Take 1 tablet (20 mg total) by mouth daily at 6 PM. Patient not taking: Reported on 04/15/2015 03/18/15   Kelvin Cellar, MD  clopidogrel (PLAVIX) 75 MG tablet Take 1 tablet (75 mg total) by mouth daily. Patient not  taking: Reported on 04/15/2015 03/18/15   Kelvin Cellar, MD  GLUCOSAMINE HCL PO Take 1 tablet by mouth daily. Reported on 04/15/2015    Historical Provider, MD  promethazine (PHENERGAN) 12.5 MG suppository Place 1 suppository (12.5 mg total) rectally every 8 (eight) hours as needed for nausea or  vomiting. Patient not taking: Reported on 04/15/2015 03/18/15   Kelvin Cellar, MD  testosterone cypionate (DEPOTESTOTERONE CYPIONATE) 200 MG/ML injection Inject 1 mL (200 mg total) into the muscle every 28 (twenty-eight) days. Patient not taking: Reported on 03/11/2015 11/20/12   Blanchie Serve, MD    No Known Allergies  Physical Exam  Vitals  Blood pressure 125/81, pulse 81, temperature 97.9 F (36.6 C), temperature source Temporal, resp. rate 20, height 6' (1.829 m), weight 72.576 kg (160 lb), SpO2 94 %.   1. General elderly gentleman, in no acute distress  2. Normal affect and insight, Not Suicidal or Homicidal, Awake Alert, Oriented X 3.  3. No F.N deficits, grossly, patient moving all extremities .Marland Kitchen  4. Ears and Eyes appear Normal, Conjunctivae clear, PERRLA. Moist Oral Mucosa.  5. Supple Neck, No JVD, No cervical lymphadenopathy appriciated, No Carotid Bruits.  6. Symmetrical Chest wall movement, Good air movement bilaterally, CTAB.  7. RRR, No Gallops, Rubs or Murmurs, No Parasternal Heave.  8. Positive Bowel Sounds, Abdomen Soft, Non tender, No organomegaly appriciated,No rebound -guarding or rigidity.  9.  No Cyanosis, Normal Skin Turgor, No Skin Rash or Bruise.  10. Good muscle tone,  joints appear normal , no effusions, Normal ROM.  11. No Palpable Lymph Nodes in Neck or Axillae    Data Review  CBC  Recent Labs Lab 04/15/15 1533  WBC 8.3  HGB 13.0  HCT 39.4  PLT 117*  MCV 98.3  MCH 32.4  MCHC 33.0  RDW 13.1   ------------------------------------------------------------------------------------------------------------------  Chemistries   Recent Labs Lab  04/15/15 1533  NA 143  K 3.5  CL 104  CO2 31  GLUCOSE 99  BUN 19  CREATININE 1.24  CALCIUM 9.2   ------------------------------------------------------------------------------------------------------------------ estimated creatinine clearance is 53.7 mL/min (by C-G formula based on Cr of 1.24). ------------------------------------------------------------------------------------------------------------------ No results for input(s): TSH, T4TOTAL, T3FREE, THYROIDAB in the last 72 hours.  Invalid input(s): FREET3   Coagulation profile No results for input(s): INR, PROTIME in the last 168 hours. ------------------------------------------------------------------------------------------------------------------- No results for input(s): DDIMER in the last 72 hours. -------------------------------------------------------------------------------------------------------------------  Cardiac Enzymes No results for input(s): CKMB, TROPONINI, MYOGLOBIN in the last 168 hours.  Invalid input(s): CK ------------------------------------------------------------------------------------------------------------------ Invalid input(s): POCBNP   ---------------------------------------------------------------------------------------------------------------  Urinalysis    Component Value Date/Time   COLORURINE YELLOW 04/15/2015 [place] 04/15/2015 1653   LABSPEC 1.016 04/15/2015 1653   PHURINE 7.5 04/15/2015 1653   GLUCOSEU NEGATIVE 04/15/2015 1653   HGBUR NEGATIVE 04/15/2015 1653   BILIRUBINUR NEGATIVE 04/15/2015 1653   KETONESUR NEGATIVE 04/15/2015 1653   PROTEINUR NEGATIVE 04/15/2015 1653   UROBILINOGEN 0.2 08/14/2014 1200   NITRITE NEGATIVE 04/15/2015 1653   LEUKOCYTESUR NEGATIVE 04/15/2015 1653    ----------------------------------------------------------------------------------------------------------------  Imaging results:   Dg Chest 2 View  04/15/2015   CLINICAL DATA:  Syncopal episode today.  History of MI, stroke. EXAM: CHEST  2 VIEW COMPARISON:  Chest x-ray dated 04/07/2015. FINDINGS: Heart size is upper normal, unchanged. Overall cardiomediastinal silhouette is stable in size and configuration. Septal defect occluded device is stable in position overlying the right heart. There is mild chronic scarring/ fibrosis bilaterally, unchanged. Lungs otherwise clear. No evidence of pneumonia. No pleural effusion seen. No pneumothorax seen. Slight elevation/ eventration of the right hemidiaphragm is stable. Osseous and soft tissue structures about the chest are otherwise unremarkable. IMPRESSION: Stable chest x-ray. No evidence of acute cardiopulmonary abnormality. Electronically Signed   By: Franki Cabot M.D.   On: 04/15/2015 15:45  My personal review of EKG: Rhythm NSR, Rate  65 bpm, with PVCs and no acute changes    Assessment & Plan  1. Syncope, seems to be related to orthostatic hypotension     Patient has history of falls and similar episodes 2. 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 History of hypertension with hypertensive encephalopathy 4. History of respiratory failure  Plan Place under observation in telemetry Serial troponins Physical therapy evaluation and treatment. Discontinue chlorthalidone.    DVT Prophylaxis Lovenox  AM Labs Ordered, also please review Full Orders  Family Communication: Admission, patients condition and plan of care including tests being ordered have been discussed with the patient and wife  who indicate understanding and agree with the plan and Code Status.  Code Status full  Disposition Plan: Home  Time spent in minutes :37 minutes  Condition GUARDED  @SIGNATURE @

## 2015-04-16 DIAGNOSIS — E785 Hyperlipidemia, unspecified: Secondary | ICD-10-CM | POA: Diagnosis not present

## 2015-04-16 DIAGNOSIS — Z8673 Personal history of transient ischemic attack (TIA), and cerebral infarction without residual deficits: Secondary | ICD-10-CM | POA: Diagnosis not present

## 2015-04-16 DIAGNOSIS — E039 Hypothyroidism, unspecified: Secondary | ICD-10-CM

## 2015-04-16 DIAGNOSIS — R55 Syncope and collapse: Secondary | ICD-10-CM | POA: Diagnosis not present

## 2015-04-16 DIAGNOSIS — I951 Orthostatic hypotension: Secondary | ICD-10-CM

## 2015-04-16 LAB — TROPONIN I: Troponin I: <0.03 ng/mL

## 2015-04-16 LAB — TSH: TSH: 1.187 u[IU]/mL (ref 0.350–4.500)

## 2015-04-16 MED ORDER — LABETALOL HCL 100 MG PO TABS: 100.0000 mg | ORAL_TABLET | Freq: Two times a day (BID) | ORAL | Status: AC

## 2015-04-16 MED ORDER — LABETALOL HCL 100 MG PO TABS
100.0000 mg | ORAL_TABLET | Freq: Two times a day (BID) | ORAL | Status: DC
  Filled 2015-04-16: qty 1

## 2015-04-16 MED ORDER — ENOXAPARIN SODIUM 40 MG/0.4ML ~~LOC~~ SOLN: 40.0000 mg | SUBCUTANEOUS | Status: DC

## 2015-04-16 MED ORDER — ASPIRIN 81 MG PO TABS: 81.0000 mg | ORAL_TABLET | Freq: Every day | ORAL | Status: DC

## 2015-04-16 MED ORDER — SODIUM CHLORIDE 0.9 % IV BOLUS (SEPSIS)
1000.0000 mL | Freq: Once | INTRAVENOUS | Status: AC
  Administered 2015-04-16: 1000 mL via INTRAVENOUS

## 2015-04-16 MED ORDER — CLOPIDOGREL BISULFATE 75 MG PO TABS
75.0000 mg | ORAL_TABLET | Freq: Every day | ORAL | Status: DC
  Administered 2015-04-16: 75 mg via ORAL
  Filled 2015-04-16: qty 1

## 2015-04-16 MED ORDER — LABETALOL HCL 100 MG PO TABS: 150.0000 mg | ORAL_TABLET | Freq: Two times a day (BID) | ORAL | Status: DC

## 2015-04-16 NOTE — Care Management Obs Status (Signed)
[place] NOTIFICATION   Patient Details  Name: KATHARINE YANNUZZI MRN: [id number] Date of Birth: 1941-03-09   Medicare Observation Status Notification Given:  Yes    Bethena Roys, RN 04/16/2015, 12:13 PM

## 2015-04-16 NOTE — Plan of Care (Signed)
Problem: Health Behavior/Discharge Planning: Goal: Ability to manage health-related needs will improve Outcome: Progressing Per patient's wife, patient has had multiple falls recently.  According to that, patient is at high risk for falls.  Fall risk measures in place.  RN instructed patient to call and wait for assistance prior to getting out of bed.  Patient stated understanding and thus far this shift has made no attempts to get out of bed.  Safe environment being provided per RN and patient care tech.

## 2015-04-16 NOTE — Discharge Summary (Signed)
Physician Discharge Summary  Benjamin Park [id number] DOB: 01-19-1941 DOA: 04/15/2015  PCP: No primary care provider on file.  Admit date: 04/15/2015 Discharge date: 04/16/2015  Time spent: 35 minutes  Recommendations for Outpatient Follow-up:  1. Reassess blood pressure and adjust antihypertensive regimen as needed 2. If the patient have any further episodes of orthostatic events will benefit of starting midodrine therapy 3. Repeat basic metabolic panel to follow electrolytes and renal function   Discharge Diagnoses:  Active Problems:   Syncope orthostatic hypotension Essential hypertension Hypertension  Hyperlipidemia Hypothyroidism History of CVA/TIA Dysphagia as a residual effect from CVA   Discharge Condition: Stable and improved. Patient discharged home with resumption of his home health services. Will need close follow-up by PCP in approximately 10 days.  Diet recommendation: Low sodium diet; patient with residual dysphasia to liquids from CVA, requiring liquids to be thickened up to a honey thick consistency.   Filed Weights   04/15/15 1424 04/15/15 2037  Weight: 72.576 kg (160 lb) 68.539 kg (151 lb 1.6 oz)    History of present illness:  75 y.o. male, her past medical history significant for hypertension, TIA, hyperlipidemia and separate nose hemorrhage was recently discharged from the hospital on 03/18/2015 after an admission for hypertensive encephalopathy, and acute encephalopathy . Patient denies any chest pains, shortness of breath nausea and vomiting at this time but had an episode of vomiting earlier.. The episode was described by his wife who was at bedside as loss of consciousness for around 3 minutes with no reported tonic-clonic activity. His wife reports similar episodes lately and history of multiple falls. Patient had workup during his last hospitalization which included an echocardiogram and carotid Dopplers. In the emergency room, the patient was noted  to have orthostatic hypotension. [facility] called to admit the patient for further evaluation and treatment.  Hospital Course:  1-syncope: Appears to be related to orthostatic hypertension -Patient blood pressure medication has been adjusted -Diuretics has been discontinue -Patient received fluid resuscitation and has been advised to keep himself well-hydrated (difficult challenge, since the patient had history of dysphagia and requires honey thick liquids). -In order to also assist with his orthostatic changes has been instructed to wear compression stockings -Close outpatient follow-up has been instructed; patient will benefit of midodrine use if BP continue dropping while changing positions.  2-history of CVA/TIAs -No new focal deficit -Will continue risk factor modifications -Continue aspirin/Plavix and statins  3-dysphagia: Especially to liquids. Received will effect from CVA -Will continue honey thick liquids  4-hypothyroidism: Will continue Synthroid  5-hyperlipidemia: Continue statins  6-hypertension: With orthostatic changes. -Will continue the use of labetalol -Will continue diuretics -Patient instructed not to further use clonidine -Advised to follow low sodium diet   Procedures:  See below for x-ray reports  Consultations:  None  Discharge Exam: Filed Vitals:   04/16/15 0349 04/16/15 0359  BP: 88/54 110/66  Pulse: 65   Temp: 98.5 F (36.9 C)   Resp: 17     General: Afebrile, denies chest pain and shortness of breath. No palpitations. Patient without headaches, lightheadedness or any further episode of syncope. Patient is still orthostatic on the morning of discharge (received a bolus of normal saline; with improvement in his spinal signs). Otherwise asymptomatic. Cardiovascular: S1 and S2, no rubs, no gallops  Respiratory: Good air movement bilaterally, scattered rhonchi; no wheezing or crackles abdomen: Soft, nontender, nondistended, positive  bowel sounds  Discharge Instructions   Discharge Instructions    Diet - low sodium heart  healthy    Complete by:  As directed      Discharge instructions    Complete by:  As directed   Take medications as prescribed Maintained adequate hydration Arrange follow-up with PCP in 10 days Take a time when changing positions especially lay down to sitting and to standing; to prevent orthostatic changes and passing out. Start the use of compression stockings (wear then 12 hours on and 12 hours off; especially when standing)          Current Discharge Medication List    CONTINUE these medications which have CHANGED   Details  aspirin 81 MG tablet Take 1 tablet (81 mg total) by mouth daily.    labetalol (NORMODYNE) 100 MG tablet Take 1 tablet (100 mg total) by mouth 2 (two) times daily. Qty: 60 tablet, Refills: 1      CONTINUE these medications which have NOT CHANGED   Details  Ascorbic Acid (VITAMIN C PO) Take 1 tablet by mouth daily.    ENSURE (ENSURE) Take 237 mLs by mouth daily.    fish oil-omega-3 fatty acids 1000 MG capsule Take 1 g by mouth daily.     KLOR-CON M10 10 MEQ tablet Take 10 mEq by mouth daily.    levothyroxine (SYNTHROID, LEVOTHROID) 112 MCG tablet Take 112 mcg by mouth daily.     lovastatin (MEVACOR) 20 MG tablet Take 20 mg by mouth 2 (two) times daily.    Maltodextrin-Xanthan Gum (RESOURCE THICKENUP CLEAR) POWD Take 1 g by mouth as needed. Qty: 1 Can, Refills: 6    Multiple Vitamin (MULITIVITAMIN WITH MINERALS) TABS Take 1 tablet by mouth daily.      atorvastatin (LIPITOR) 20 MG tablet Take 1 tablet (20 mg total) by mouth daily at 6 PM. Qty: 30 tablet, Refills: 1    clopidogrel (PLAVIX) 75 MG tablet Take 1 tablet (75 mg total) by mouth daily. Qty: 30 tablet, Refills: 0    GLUCOSAMINE HCL PO Take 1 tablet by mouth daily. Reported on 04/15/2015      STOP taking these medications     chlorthalidone (HYGROTON) 25 MG tablet      cloNIDine (CATAPRES)  0.1 MG tablet      promethazine (PHENERGAN) 12.5 MG suppository      testosterone cypionate (DEPOTESTOTERONE CYPIONATE) 200 MG/ML injection        No Known Allergies   The results of significant diagnostics from this hospitalization (including imaging, microbiology, ancillary and laboratory) are listed below for reference.    Significant Diagnostic Studies: Dg Chest 2 View  04/15/2015  CLINICAL DATA:  Syncopal episode today.  History of MI, stroke. EXAM: CHEST  2 VIEW COMPARISON:  Chest x-ray dated 04/07/2015. FINDINGS: Heart size is upper normal, unchanged. Overall cardiomediastinal silhouette is stable in size and configuration. Septal defect occluded device is stable in position overlying the right heart. There is mild chronic scarring/ fibrosis bilaterally, unchanged. Lungs otherwise clear. No evidence of pneumonia. No pleural effusion seen. No pneumothorax seen. Slight elevation/ eventration of the right hemidiaphragm is stable. Osseous and soft tissue structures about the chest are otherwise unremarkable. IMPRESSION: Stable chest x-ray. No evidence of acute cardiopulmonary abnormality. Electronically Signed   By: Franki Cabot M.D.   On: 04/15/2015 15:45   Labs: Basic Metabolic Panel:  Recent Labs Lab 04/15/15 1533 04/15/15 2214  NA 143  --   K 3.5  --   CL 104  --   CO2 31  --   GLUCOSE 99  --  BUN 19  --   CREATININE 1.24 1.18  CALCIUM 9.2  --    CBC:  Recent Labs Lab 04/15/15 1533 04/15/15 2214  WBC 8.3 11.6*  HGB 13.0 13.5  HCT 39.4 39.2  MCV 98.3 98.2  PLT 117* 155   Cardiac Enzymes:  Recent Labs Lab 04/15/15 2214 04/16/15 0216 04/16/15 0934  TROPONINI <0.03 <0.03 <0.03    Signed:  Barton Dubois MD.  [facility] 04/16/2015, 12:45 PM

## 2015-04-16 NOTE — Plan of Care (Signed)
Problem: Education: Goal: Knowledge of [place] [facility] Education information/materials will improve Outcome: Progressing Plan of care reviewed with patient and patient's wife.  Medication education provided to patient and patient's wife prior to administering medications that were ordered thus far this shift.  Patient has denied pain thus far this shift.

## 2015-04-16 NOTE — Evaluation (Signed)
Physical Therapy Evaluation Patient Details Name: Benjamin Park MRN: [id number] DOB: 1940-04-25 Today's Date: 04/16/2015   History of Present Illness  Benjamin Park is a 75 y.o. male, her past medical history significant for hypertension, TIA, hyperlipidemia and separate nose hemorrhage was recently discharged from the hospital on 03/18/2015 after an admission for hypertensive encephalopathy, and acute encephalopathy .   Pt admitted with syncope after having 3 min episode of LOC.  Clinical Impression  Pt admitted with above diagnosis. Pt currently with functional limitations due to the deficits listed below (see PT Problem List).  Pt will benefit from skilled PT to increase their independence and safety with mobility to allow discharge to the venue listed below.  Pt moved well, but with orthostatic hypotension during eval which limited full gait assessment. Pt denies dizziness or lightheadedness, but did appear slightly more pale to this observer in standing.  Supine BP 129/81 HR 66 Sitting BP 102/ 81 HR 87 Standing BP 112/60 HR 84 After 3 mins of standing BP 46/35 HR 81-Rechecked BP in standing again and 59/32 HR 90     Follow Up Recommendations Home health PT;Supervision for mobility/OOB    Equipment Recommendations  None recommended by PT    Recommendations for Other Services       Precautions / Restrictions Precautions Precautions: Fall Precaution Comments: 3 falls in last 6 months, orthostatic hypotension Restrictions Weight Bearing Restrictions: No      Mobility  Bed Mobility Overal bed mobility: Modified Independent             General bed mobility comments: Flat bed without rails  Transfers Overall transfer level: Needs assistance Equipment used: Rolling walker (2 wheeled) Transfers: Sit to/from Stand Sit to Stand: Min guard         General transfer comment: min/guard for steadiness, cues for hand placement. Stood for orthostatic  BP  Ambulation/Gait Ambulation/Gait assistance: Museum/gallery curator (Feet): 10 Feet Assistive device: Rolling walker (2 wheeled) Gait Pattern/deviations: Decreased stride length     General Gait Details: Limited gait assessment due to orthostatic hypotension. Unsteadiness.  Stairs            Wheelchair Mobility    Modified Rankin (Stroke Patients Only)       Balance Overall balance assessment: History of Falls;Needs assistance   Sitting balance-Leahy Scale: Good       Standing balance-Leahy Scale: Poor                               Pertinent Vitals/Pain Pain Assessment: No/denies pain  Supine BP 129/81 HR 66 Sitting BP 102/ 81 HR 87 Standing BP 112/60 HR 84 After 3 mins of standing BP 46/35 HR 81-Rechecked BP in standing again and 59/32 HR 90     Home Living Family/patient expects to be discharged to:: Private residence Living Arrangements: Spouse/significant other Available Help at Discharge: Family;Available PRN/intermittently Type of Home: House Home Access: Stairs to enter Entrance Stairs-Rails: Right Entrance Stairs-Number of Steps: 5 Home Layout: One level;Laundry or work area in [place]: Environmental consultant - 2 wheels;Cane - single point;Shower seat Additional Comments: pt's wife works as Quarry manager at [employer] 2nd shift as well as Ecologist a few mornings a week. Pt does not drive.     Prior Function Level of Independence: Independent with assistive device(s)         Comments: Amb with cane household distances, but uses rollator for community  distances.  Has A on the stairs.     Hand Dominance   Dominant Hand: Right    Extremity/Trunk Assessment   Upper Extremity Assessment: Overall WFL for tasks assessed           Lower Extremity Assessment: Overall WFL for tasks assessed         Communication   Communication: HOH  Cognition Arousal/Alertness: Awake/alert Behavior During Therapy: WFL for  tasks assessed/performed Overall Cognitive Status: Within Functional Limits for tasks assessed                      General Comments General comments (skin integrity, edema, etc.): Pt with no c/o dizziness or lightheadedness during session.    Exercises        Assessment/Plan    PT Assessment Patient needs continued PT services  PT Diagnosis Difficulty walking   PT Problem List Decreased strength;Decreased activity tolerance;Decreased balance;Decreased mobility;Cardiopulmonary status limiting activity  PT Treatment Interventions Gait training;Functional mobility training;Stair training;DME instruction;Therapeutic activities;Therapeutic exercise;Patient/family education;Balance training   PT Goals (Current goals can be found in the Care Plan section) Acute Rehab PT Goals Patient Stated Goal: go home PT Goal Formulation: With patient Time For Goal Achievement: 04/30/15 Potential to Achieve Goals: Good    Frequency Min 3X/week   Barriers to discharge Decreased caregiver support Wife works 2nd shift    Co-evaluation               End of [place] During Treatment: Gait belt Activity Tolerance: Treatment limited secondary to medical complications (Comment) Patient left: in chair;with call bell/phone within reach;Other (comment) (lab tech in rrom. Blue chair alarm pad in place and nurse to get box due to no box in room.) Nurse Communication: Mobility status;Other (comment) (orthostatic vitals)    Functional Assessment Tool Used: clinical judgement and objective findings Functional Limitation: Mobility: Walking and moving around Mobility: Walking and Moving Around Current Status [phone]): At least 1 percent but less than 20 percent impaired, limited or restricted Mobility: Walking and Moving Around Goal Status [phone]): At least 1 percent but less than 20 percent impaired, limited or restricted    Time: 0904-0933 PT Time Calculation (min) (ACUTE  ONLY): 29 min   Charges:   PT Evaluation $PT Eval Moderate Complexity: 1 Procedure PT Treatments $Therapeutic Activity: 8-22 mins   PT G Codes:   PT G-Codes **NOT FOR INPATIENT CLASS** Functional Assessment Tool Used: clinical judgement and objective findings Functional Limitation: Mobility: Walking and moving around Mobility: Walking and Moving Around Current Status [phone]): At least 1 percent but less than 20 percent impaired, limited or restricted Mobility: Walking and Moving Around Goal Status [phone]): At least 1 percent but less than 20 percent impaired, limited or restricted    [facility] 04/16/2015, 9:49 AM

## 2015-04-16 NOTE — Care Management Note (Signed)
Case Management Note  Patient Details  Name: Benjamin Park MRN: [id number] Date of Birth: 02-Aug-1940  Subjective/Objective:     Pt admitted for syncope. Pt is from home with support of wife.                Action/Plan: Plan for d/c home with [place] did offer choice and pt agreeable to [facility]- Co pay will be $40.00 per visit. [facility] to speak with pt in regards to visits if agreeable to co pay. IF not agreeable pt will be without services. No further needs from CM at this time.   Expected Discharge Date:                  Expected Discharge Plan:  [place]  In-House Referral:  NA  Discharge planning Services  CM Consult  Post Acute Care Choice:  Home Health Choice offered to:  Patient  DME Arranged:  N/A DME Agency:  NA  HH Arranged:  PT [place] Agency:  [facility]  Status of Service:  Completed, signed off  Medicare Important Message Given:    Date Medicare IM Given:    Medicare IM give by:    Date Additional Medicare IM Given:    Additional Medicare Important Message give by:     If discussed at Opp of Stay Meetings, dates discussed:    Additional Comments:  Bethena Roys, RN 04/16/2015, 2:14 PM

## 2015-05-02 ENCOUNTER — Encounter (HOSPITAL_COMMUNITY): Payer: Self-pay | Admitting: Emergency Medicine

## 2015-05-02 ENCOUNTER — Other Ambulatory Visit: Payer: Self-pay

## 2015-05-02 ENCOUNTER — Emergency Department (HOSPITAL_COMMUNITY)
Admission: EM | Admit: 2015-05-02 | Discharge: 2015-05-02 | Disposition: A | Discharge status: Home / Self Care | Payer: Medicare HMO | Attending: Emergency Medicine | Admitting: Emergency Medicine

## 2015-05-02 DIAGNOSIS — E860 Dehydration: Secondary | ICD-10-CM | POA: Diagnosis not present

## 2015-05-02 DIAGNOSIS — Z87442 Personal history of urinary calculi: Secondary | ICD-10-CM | POA: Insufficient documentation

## 2015-05-02 DIAGNOSIS — G4730 Sleep apnea, unspecified: Secondary | ICD-10-CM | POA: Insufficient documentation

## 2015-05-02 DIAGNOSIS — Z87891 Personal history of nicotine dependence: Secondary | ICD-10-CM | POA: Diagnosis not present

## 2015-05-02 DIAGNOSIS — R110 Nausea: Secondary | ICD-10-CM | POA: Diagnosis present

## 2015-05-02 DIAGNOSIS — Z9889 Other specified postprocedural states: Secondary | ICD-10-CM | POA: Insufficient documentation

## 2015-05-02 DIAGNOSIS — Z87448 Personal history of other diseases of urinary system: Secondary | ICD-10-CM | POA: Diagnosis not present

## 2015-05-02 DIAGNOSIS — Z7982 Long term (current) use of aspirin: Secondary | ICD-10-CM | POA: Insufficient documentation

## 2015-05-02 DIAGNOSIS — Z9861 Coronary angioplasty status: Secondary | ICD-10-CM | POA: Insufficient documentation

## 2015-05-02 DIAGNOSIS — Z79899 Other long term (current) drug therapy: Secondary | ICD-10-CM | POA: Insufficient documentation

## 2015-05-02 DIAGNOSIS — E039 Hypothyroidism, unspecified: Secondary | ICD-10-CM | POA: Insufficient documentation

## 2015-05-02 DIAGNOSIS — Z87438 Personal history of other diseases of male genital organs: Secondary | ICD-10-CM | POA: Diagnosis not present

## 2015-05-02 DIAGNOSIS — Z9981 Dependence on supplemental oxygen: Secondary | ICD-10-CM | POA: Diagnosis not present

## 2015-05-02 DIAGNOSIS — G20 Parkinson's disease: Secondary | ICD-10-CM | POA: Diagnosis not present

## 2015-05-02 DIAGNOSIS — Z85819 Personal history of malignant neoplasm of unspecified site of lip, oral cavity, and pharynx: Secondary | ICD-10-CM | POA: Insufficient documentation

## 2015-05-02 DIAGNOSIS — E785 Hyperlipidemia, unspecified: Secondary | ICD-10-CM | POA: Insufficient documentation

## 2015-05-02 DIAGNOSIS — I252 Old myocardial infarction: Secondary | ICD-10-CM | POA: Diagnosis not present

## 2015-05-02 DIAGNOSIS — I2510 Atherosclerotic heart disease of native coronary artery without angina pectoris: Secondary | ICD-10-CM | POA: Insufficient documentation

## 2015-05-02 DIAGNOSIS — Z8673 Personal history of transient ischemic attack (TIA), and cerebral infarction without residual deficits: Secondary | ICD-10-CM | POA: Diagnosis not present

## 2015-05-02 DIAGNOSIS — I10 Essential (primary) hypertension: Secondary | ICD-10-CM | POA: Insufficient documentation

## 2015-05-02 LAB — BASIC METABOLIC PANEL
ANION GAP: 8 (ref 5–15)
BUN: 26 mg/dL — ABNORMAL HIGH (ref 6–20)
CALCIUM: 9.3 mg/dL (ref 8.9–10.3)
CO2: 30 mmol/L (ref 22–32)
Chloride: 105 mmol/L (ref 101–111)
Creatinine, Ser: 1.38 mg/dL — ABNORMAL HIGH (ref 0.61–1.24)
GFR calc Af Amer: 57 mL/min — ABNORMAL LOW (ref >60)
GFR, EST NON AFRICAN AMERICAN: 49 mL/min — AB (ref >60)
GLUCOSE: 93 mg/dL (ref 65–99)
POTASSIUM: 3.3 mmol/L — AB (ref 3.5–5.1)
SODIUM: 143 mmol/L (ref 135–145)

## 2015-05-02 LAB — CBC WITH DIFFERENTIAL/PLATELET
BASOS ABS: 0 10*3/uL (ref 0.0–0.1)
BASOS PCT: 0 %
EOS PCT: 3 %
Eosinophils Absolute: 0.4 10*3/uL (ref 0.0–0.7)
HEMATOCRIT: 41 % (ref 39.0–52.0)
Hemoglobin: 13.8 g/dL (ref 13.0–17.0)
Lymphocytes Relative: 6 %
Lymphs Abs: 0.6 10*3/uL — ABNORMAL LOW (ref 0.7–4.0)
MCH: 32.8 pg (ref 26.0–34.0)
MCHC: 33.7 g/dL (ref 30.0–36.0)
MCV: 97.4 fL (ref 78.0–100.0)
Monocytes Absolute: 0.5 10*3/uL (ref 0.1–1.0)
Monocytes Relative: 5 %
NEUTROS ABS: 9 10*3/uL — AB (ref 1.7–7.7)
Neutrophils Relative %: 86 %
PLATELETS: 188 10*3/uL (ref 150–400)
RBC: 4.21 MIL/uL — AB (ref 4.22–5.81)
RDW: 12.9 % (ref 11.5–15.5)
WBC: 10.5 10*3/uL (ref 4.0–10.5)

## 2015-05-02 LAB — URINALYSIS, ROUTINE W REFLEX MICROSCOPIC
Bilirubin Urine: NEGATIVE
GLUCOSE, UA: NEGATIVE mg/dL
Hgb urine dipstick: NEGATIVE
KETONES UR: NEGATIVE mg/dL
LEUKOCYTES UA: NEGATIVE
NITRITE: NEGATIVE
PROTEIN: NEGATIVE mg/dL
Specific Gravity, Urine: 1.02 (ref 1.005–1.030)
pH: 7 (ref 5.0–8.0)

## 2015-05-02 LAB — I-STAT TROPONIN, ED: Troponin i, poc: 0.01 ng/mL (ref 0.00–0.08)

## 2015-05-02 MED ORDER — SODIUM CHLORIDE 0.9 % IV BOLUS (SEPSIS)
500.0000 mL | Freq: Once | INTRAVENOUS | Status: AC
  Administered 2015-05-02: 500 mL via INTRAVENOUS

## 2015-05-02 NOTE — Discharge Instructions (Signed)

## 2015-05-02 NOTE — ED Notes (Signed)
Pt with syncopal episode while in church today. Pt vomited.

## 2015-05-02 NOTE — ED Notes (Signed)
Bed: [id number] Expected date:  Expected time:  Means of arrival:  Comments: EMS- syncopal episode

## 2015-05-02 NOTE — ED Provider Notes (Signed)
CSN: [id number]     Arrival date & time 05/02/15  1203 History   First MD Initiated Contact with Patient 05/02/15 1239     Chief Complaint  Patient presents with  . Loss of Consciousness      HPI Patient was at "Sunday school today when he had a near syncopal episode.  He was nauseated prior to this study vomited times one he feels back to baseline now.  Patient recently admitted for same.  Patient denies headache. Past Medical History  Diagnosis Date  . Hx of transient ischemic attack (TIA)   . Hyperlipidemia   . ED (erectile dysfunction)   . Personal history of kidney stones   . Subarachnoid hemorrhage (HCC)     '11- hospitalized -3 to 4 days(passed out-awoke in hospital)- no residual affects.  . Myocardial infarction (HCC)     10" /1993  . Dysphagia   . Hypokalemia   . Hypothyroidism   . Sleep apnea     on CPAP-settings 6  . History of kidney stones 02-27-13    x5- none recent  . Throat cancer [facility])     '00-radiation therapy  . Stroke ([place])   . Unspecified cerebral artery occlusion with cerebral infarction 11/08/2012  . Episodic confusion 02-27-13    hx. of, not currently(multiple episodes- ? TIA's)-always saw MD to be evaluated.  Marland Kitchen AKI (acute kidney injury) ([place]) 02/23/2011    pt. denies any problems 02-27-13  . Hypertension     hx. labile - with spikes every 3-6 mos.-last 8'14 ED visit.  Marland Kitchen Hypertensive encephalopathy   . Headache(784.0)     episodic with periods of confusion -multiple  . Neuromuscular disorder ([place])     "parkinson's" -no tremor-sees neurology MD at [facility]  . Coronary artery disease     Dr. [place]   Past Surgical History  Procedure Laterality Date  . Angioplasty  1993    LAD  . Cardiac catheterization    . Coronary angioplasty    . Basal cell cancer      head  . Hernia repair    . Appendectomy    . Patent foramen ovale closure  2008  . Eye surgery    . Colonoscopy N/A 06/18/2012    Procedure: COLONOSCOPY;  Surgeon: Jeryl Columbia, MD;  Location:  [facility] ENDOSCOPY;  Service: Endoscopy;  Laterality: N/A;  h&p in file-[place]   . Hot hemostasis N/A 06/18/2012    Procedure: HOT HEMOSTASIS (ARGON PLASMA COAGULATION/BICAP);  Surgeon: Jeryl Columbia, MD;  Location: [facility] ENDOSCOPY;  Service: Endoscopy;  Laterality: N/A;  . Colonoscopy with propofol N/A 03/07/2013    Procedure: COLONOSCOPY WITH PROPOFOL;  Surgeon: Jeryl Columbia, MD;  Location: [facility] ENDOSCOPY;  Service: Endoscopy;  Laterality: N/A;   Family History  Problem Relation Age of Onset  . Heart failure Father   . Heart attack Brother   . Anemia Brother 7    mi   Social History  Substance Use Topics  . Smoking status: Former Smoker    Types: Cigarettes    Quit date: 09/21/1990  . Smokeless tobacco: Never Used  . Alcohol Use: No    Review of Systems  All other systems reviewed and are negative  Allergies  Review of patient's allergies indicates no known allergies.  Home Medications   Prior to Admission medications   Medication Sig Start Date End Date Taking? Authorizing Provider  aspirin 325 MG tablet Take 325 mg by mouth daily.   Yes Historical Provider, MD  chlorthalidone (  HYGROTON) 25 MG tablet Take 25 mg by mouth daily.   Yes Historical Provider, MD  labetalol (NORMODYNE) 200 MG tablet Take 200 mg by mouth 2 (two) times daily.   Yes Historical Provider, MD  Ascorbic Acid (VITAMIN C PO) Take 1 tablet by mouth daily.    Historical Provider, MD  aspirin 81 MG tablet Take 1 tablet (81 mg total) by mouth daily. 04/16/15   Barton Dubois, MD  atorvastatin (LIPITOR) 20 MG tablet Take 1 tablet (20 mg total) by mouth daily at 6 PM. Patient not taking: Reported on 04/15/2015 03/18/15   Kelvin Cellar, MD  clopidogrel (PLAVIX) 75 MG tablet Take 1 tablet (75 mg total) by mouth daily. Patient not taking: Reported on 04/15/2015 03/18/15   Kelvin Cellar, MD  ENSURE (ENSURE) Take 237 mLs by mouth daily.    Historical Provider, MD  fish oil-omega-3 fatty acids 1000 MG capsule Take 1 g by mouth  daily.     Historical Provider, MD  GLUCOSAMINE HCL PO Take 1 tablet by mouth daily. Reported on 04/15/2015    Historical Provider, MD  KLOR-CON M10 10 MEQ tablet Take 10 mEq by mouth daily. 05/12/14   Historical Provider, MD  labetalol (NORMODYNE) 100 MG tablet Take 1 tablet (100 mg total) by mouth 2 (two) times daily. 04/16/15   Barton Dubois, MD  levothyroxine (SYNTHROID, LEVOTHROID) 112 MCG tablet Take 112 mcg by mouth daily.  12/11/13   Historical Provider, MD  lovastatin (MEVACOR) 20 MG tablet Take 20 mg by mouth 2 (two) times daily.    Historical Provider, MD  Maltodextrin-Xanthan Gum (RESOURCE THICKENUP CLEAR) POWD Take 1 g by mouth as needed. 02/03/14   Melton Alar, PA-C  Multiple Vitamin (MULITIVITAMIN WITH MINERALS) TABS Take 1 tablet by mouth daily.      Historical Provider, MD   BP 122/79 mmHg  Pulse 77  Temp(Src) 97.9 F (36.6 C) (Oral)  Resp 16  SpO2 97% Physical Exam Physical Exam  Nursing note and vitals reviewed. Constitutional: He is oriented to person, place, and time. He appears well-developed and well-nourished. No distress.  HENT:  Head: Normocephalic and atraumatic.  Eyes: Pupils are equal, round, and reactive to light.  Neck: Normal range of motion.  Cardiovascular: Normal rate and intact distal pulses.   Pulmonary/Chest: No respiratory distress.  Abdominal: Normal appearance. He exhibits no distension.  Musculoskeletal: Normal range of motion.  Neurological: He is alert and oriented to person, place, and time. No cranial nerve deficit.  Skin: Skin is warm and dry. No rash noted.  Psychiatric: He has a normal mood and affect. His behavior is normal.   ED Course  Procedures (including critical care time) Medications  sodium chloride 0.9 % bolus 500 mL (0 mLs Intravenous Stopped 05/02/15 1504)    Labs Review Labs Reviewed  BASIC METABOLIC PANEL - Abnormal; Notable for the following:    Potassium 3.3 (*)    BUN 26 (*)    Creatinine, Ser 1.38 (*)    GFR  calc non Af Amer 49 (*)    GFR calc Af Amer 57 (*)    All other components within normal limits  CBC WITH DIFFERENTIAL/PLATELET - Abnormal; Notable for the following:    RBC 4.21 (*)    Neutro Abs 9.0 (*)    Lymphs Abs 0.6 (*)    All other components within normal limits  URINALYSIS, ROUTINE W REFLEX MICROSCOPIC (NOT AT [facility])  I-STAT TROPOININ, ED    Imaging Review No results found.  I have personally reviewed and evaluated these images and lab results as part of my medical decision-making.   EKG Interpretation   Date/Time:  Sunday May 02 2015 12:28:14 EST Ventricular Rate:  69 PR Interval:  205 QRS Duration: 101 QT Interval:  409 QTC Calculation: 438 R Axis:   95 Text Interpretation:  Sinus rhythm Right axis deviation Borderline low  voltage, extremity leads ED PHYSICIAN INTERPRETATION AVAILABLE IN [facility]  [facility] Confirmed by TEST, Record ([id number]) on 05/03/2015 7:43:35 AM     Patient has had recent workup for similar complaints.  He admits he has not been drinking as much as he should.  In light of increasing BUN and creatinine suspect dehydration.  Patient was given IV fluids and discussed about keeping hydrated home we'll discharge in stable condition. MDM   Final diagnoses:  Dehydration        [name] Schwartz, MD 05/04/15 [phone]

## 2015-07-07 ENCOUNTER — Inpatient Hospital Stay (HOSPITAL_COMMUNITY): Payer: Medicare HMO

## 2015-07-07 ENCOUNTER — Emergency Department (HOSPITAL_COMMUNITY): Payer: Medicare HMO

## 2015-07-07 ENCOUNTER — Encounter (HOSPITAL_COMMUNITY): Payer: Self-pay

## 2015-07-07 ENCOUNTER — Inpatient Hospital Stay (HOSPITAL_COMMUNITY)
Admission: EM | Admit: 2015-07-07 | Discharge: 2015-08-19 | DRG: 871 | Disposition: E | Payer: Medicare HMO | Attending: Internal Medicine | Admitting: Internal Medicine

## 2015-07-07 DIAGNOSIS — R4182 Altered mental status, unspecified: Secondary | ICD-10-CM | POA: Diagnosis present

## 2015-07-07 DIAGNOSIS — E119 Type 2 diabetes mellitus without complications: Secondary | ICD-10-CM | POA: Diagnosis present

## 2015-07-07 DIAGNOSIS — R0600 Dyspnea, unspecified: Secondary | ICD-10-CM | POA: Insufficient documentation

## 2015-07-07 DIAGNOSIS — A419 Sepsis, unspecified organism: Principal | ICD-10-CM | POA: Diagnosis present

## 2015-07-07 DIAGNOSIS — Z7189 Other specified counseling: Secondary | ICD-10-CM | POA: Diagnosis not present

## 2015-07-07 DIAGNOSIS — Z66 Do not resuscitate: Secondary | ICD-10-CM | POA: Diagnosis not present

## 2015-07-07 DIAGNOSIS — Z87891 Personal history of nicotine dependence: Secondary | ICD-10-CM

## 2015-07-07 DIAGNOSIS — Z681 Body mass index (BMI) 19 or less, adult: Secondary | ICD-10-CM | POA: Diagnosis not present

## 2015-07-07 DIAGNOSIS — Z7982 Long term (current) use of aspirin: Secondary | ICD-10-CM | POA: Diagnosis not present

## 2015-07-07 DIAGNOSIS — R296 Repeated falls: Secondary | ICD-10-CM | POA: Diagnosis not present

## 2015-07-07 DIAGNOSIS — R4701 Aphasia: Secondary | ICD-10-CM | POA: Diagnosis present

## 2015-07-07 DIAGNOSIS — N529 Male erectile dysfunction, unspecified: Secondary | ICD-10-CM | POA: Diagnosis present

## 2015-07-07 DIAGNOSIS — R1319 Other dysphagia: Secondary | ICD-10-CM | POA: Diagnosis present

## 2015-07-07 DIAGNOSIS — H918X9 Other specified hearing loss, unspecified ear: Secondary | ICD-10-CM | POA: Diagnosis present

## 2015-07-07 DIAGNOSIS — Z8249 Family history of ischemic heart disease and other diseases of the circulatory system: Secondary | ICD-10-CM | POA: Diagnosis not present

## 2015-07-07 DIAGNOSIS — Y848 Other medical procedures as the cause of abnormal reaction of the patient, or of later complication, without mention of misadventure at the time of the procedure: Secondary | ICD-10-CM | POA: Diagnosis not present

## 2015-07-07 DIAGNOSIS — R1310 Dysphagia, unspecified: Secondary | ICD-10-CM | POA: Diagnosis not present

## 2015-07-07 DIAGNOSIS — G9340 Encephalopathy, unspecified: Secondary | ICD-10-CM | POA: Diagnosis not present

## 2015-07-07 DIAGNOSIS — T17990A Other foreign object in respiratory tract, part unspecified in causing asphyxiation, initial encounter: Secondary | ICD-10-CM | POA: Diagnosis not present

## 2015-07-07 DIAGNOSIS — Z8774 Personal history of (corrected) congenital malformations of heart and circulatory system: Secondary | ICD-10-CM

## 2015-07-07 DIAGNOSIS — Z85819 Personal history of malignant neoplasm of unspecified site of lip, oral cavity, and pharynx: Secondary | ICD-10-CM | POA: Diagnosis not present

## 2015-07-07 DIAGNOSIS — I252 Old myocardial infarction: Secondary | ICD-10-CM | POA: Diagnosis not present

## 2015-07-07 DIAGNOSIS — E870 Hyperosmolality and hypernatremia: Secondary | ICD-10-CM | POA: Diagnosis not present

## 2015-07-07 DIAGNOSIS — I6789 Other cerebrovascular disease: Secondary | ICD-10-CM | POA: Diagnosis not present

## 2015-07-07 DIAGNOSIS — Z79899 Other long term (current) drug therapy: Secondary | ICD-10-CM

## 2015-07-07 DIAGNOSIS — J189 Pneumonia, unspecified organism: Secondary | ICD-10-CM

## 2015-07-07 DIAGNOSIS — I674 Hypertensive encephalopathy: Secondary | ICD-10-CM | POA: Diagnosis present

## 2015-07-07 DIAGNOSIS — I639 Cerebral infarction, unspecified: Secondary | ICD-10-CM | POA: Diagnosis not present

## 2015-07-07 DIAGNOSIS — Z4659 Encounter for fitting and adjustment of other gastrointestinal appliance and device: Secondary | ICD-10-CM

## 2015-07-07 DIAGNOSIS — R6521 Severe sepsis with septic shock: Secondary | ICD-10-CM | POA: Diagnosis present

## 2015-07-07 DIAGNOSIS — Z923 Personal history of irradiation: Secondary | ICD-10-CM | POA: Diagnosis not present

## 2015-07-07 DIAGNOSIS — E039 Hypothyroidism, unspecified: Secondary | ICD-10-CM | POA: Diagnosis present

## 2015-07-07 DIAGNOSIS — E871 Hypo-osmolality and hyponatremia: Secondary | ICD-10-CM | POA: Diagnosis not present

## 2015-07-07 DIAGNOSIS — G936 Cerebral edema: Secondary | ICD-10-CM | POA: Diagnosis not present

## 2015-07-07 DIAGNOSIS — E876 Hypokalemia: Secondary | ICD-10-CM | POA: Diagnosis not present

## 2015-07-07 DIAGNOSIS — I161 Hypertensive emergency: Secondary | ICD-10-CM | POA: Diagnosis not present

## 2015-07-07 DIAGNOSIS — I2510 Atherosclerotic heart disease of native coronary artery without angina pectoris: Secondary | ICD-10-CM | POA: Diagnosis present

## 2015-07-07 DIAGNOSIS — R54 Age-related physical debility: Secondary | ICD-10-CM | POA: Diagnosis present

## 2015-07-07 DIAGNOSIS — E785 Hyperlipidemia, unspecified: Secondary | ICD-10-CM | POA: Diagnosis present

## 2015-07-07 DIAGNOSIS — I63429 Cerebral infarction due to embolism of unspecified anterior cerebral artery: Secondary | ICD-10-CM | POA: Diagnosis not present

## 2015-07-07 DIAGNOSIS — E162 Hypoglycemia, unspecified: Secondary | ICD-10-CM | POA: Diagnosis not present

## 2015-07-07 DIAGNOSIS — R531 Weakness: Secondary | ICD-10-CM

## 2015-07-07 DIAGNOSIS — R1110 Vomiting, unspecified: Secondary | ICD-10-CM | POA: Diagnosis present

## 2015-07-07 DIAGNOSIS — I160 Hypertensive urgency: Secondary | ICD-10-CM | POA: Diagnosis not present

## 2015-07-07 DIAGNOSIS — G4730 Sleep apnea, unspecified: Secondary | ICD-10-CM | POA: Diagnosis present

## 2015-07-07 DIAGNOSIS — J9601 Acute respiratory failure with hypoxia: Secondary | ICD-10-CM | POA: Diagnosis not present

## 2015-07-07 DIAGNOSIS — R259 Unspecified abnormal involuntary movements: Secondary | ICD-10-CM | POA: Diagnosis present

## 2015-07-07 DIAGNOSIS — Z9889 Other specified postprocedural states: Secondary | ICD-10-CM | POA: Diagnosis not present

## 2015-07-07 DIAGNOSIS — Z8673 Personal history of transient ischemic attack (TIA), and cerebral infarction without residual deficits: Secondary | ICD-10-CM | POA: Diagnosis not present

## 2015-07-07 DIAGNOSIS — G8194 Hemiplegia, unspecified affecting left nondominant side: Secondary | ICD-10-CM | POA: Diagnosis not present

## 2015-07-07 DIAGNOSIS — Z515 Encounter for palliative care: Secondary | ICD-10-CM | POA: Diagnosis not present

## 2015-07-07 DIAGNOSIS — G4733 Obstructive sleep apnea (adult) (pediatric): Secondary | ICD-10-CM | POA: Diagnosis present

## 2015-07-07 DIAGNOSIS — J690 Pneumonitis due to inhalation of food and vomit: Secondary | ICD-10-CM | POA: Diagnosis present

## 2015-07-07 DIAGNOSIS — Z9289 Personal history of other medical treatment: Secondary | ICD-10-CM

## 2015-07-07 DIAGNOSIS — Z452 Encounter for adjustment and management of vascular access device: Secondary | ICD-10-CM

## 2015-07-07 DIAGNOSIS — F0390 Unspecified dementia without behavioral disturbance: Secondary | ICD-10-CM | POA: Diagnosis present

## 2015-07-07 DIAGNOSIS — J9600 Acute respiratory failure, unspecified whether with hypoxia or hypercapnia: Secondary | ICD-10-CM

## 2015-07-07 DIAGNOSIS — G20 Parkinson's disease: Secondary | ICD-10-CM | POA: Diagnosis present

## 2015-07-07 DIAGNOSIS — R410 Disorientation, unspecified: Secondary | ICD-10-CM | POA: Insufficient documentation

## 2015-07-07 DIAGNOSIS — Z0189 Encounter for other specified special examinations: Secondary | ICD-10-CM

## 2015-07-07 DIAGNOSIS — R0602 Shortness of breath: Secondary | ICD-10-CM

## 2015-07-07 DIAGNOSIS — R401 Stupor: Secondary | ICD-10-CM | POA: Diagnosis not present

## 2015-07-07 DIAGNOSIS — R29898 Other symptoms and signs involving the musculoskeletal system: Secondary | ICD-10-CM

## 2015-07-07 LAB — COMPREHENSIVE METABOLIC PANEL
ALBUMIN: 3.9 g/dL (ref 3.5–5.0)
ALT: 20 U/L (ref 17–63)
AST: 21 U/L (ref 15–41)
Alkaline Phosphatase: 77 U/L (ref 38–126)
Anion gap: 12 (ref 5–15)
BUN: 23 mg/dL — ABNORMAL HIGH (ref 6–20)
CHLORIDE: 102 mmol/L (ref 101–111)
CO2: 25 mmol/L (ref 22–32)
CREATININE: 1.09 mg/dL (ref 0.61–1.24)
Calcium: 9.4 mg/dL (ref 8.9–10.3)
GFR calc non Af Amer: >60 mL/min (ref >60)
GLUCOSE: 159 mg/dL — AB (ref 65–99)
Potassium: 3.4 mmol/L — ABNORMAL LOW (ref 3.5–5.1)
SODIUM: 139 mmol/L (ref 135–145)
Total Bilirubin: 0.8 mg/dL (ref 0.3–1.2)
Total Protein: 7.9 g/dL (ref 6.5–8.1)

## 2015-07-07 LAB — CBG MONITORING, ED: GLUCOSE-CAPILLARY: 138 mg/dL — AB (ref 65–99)

## 2015-07-07 LAB — I-STAT VENOUS BLOOD GAS, ED
Acid-Base Excess: 2 mmol/L (ref 0.0–2.0)
BICARBONATE: 29.3 meq/L — AB (ref 20.0–24.0)
O2 SAT: 15 %
PCO2 VEN: 54.5 mmHg — AB (ref 45.0–50.0)
PH VEN: 7.339 — AB (ref 7.250–7.300)
PO2 VEN: 14 mmHg — AB (ref 31.0–45.0)
TCO2: 31 mmol/L (ref 0–100)

## 2015-07-07 LAB — I-STAT ARTERIAL BLOOD GAS, ED
ACID-BASE DEFICIT: 2 mmol/L (ref 0.0–2.0)
Bicarbonate: 22.9 mEq/L (ref 20.0–24.0)
O2 SAT: 81 %
TCO2: 24 mmol/L (ref 0–100)
pCO2 arterial: 39.8 mmHg (ref 35.0–45.0)
pH, Arterial: 7.375 (ref 7.350–7.450)
pO2, Arterial: 50 mmHg — ABNORMAL LOW (ref 80.0–100.0)

## 2015-07-07 LAB — I-STAT CHEM 8, ED
BUN: 28 mg/dL — AB (ref 6–20)
CHLORIDE: 100 mmol/L — AB (ref 101–111)
Calcium, Ion: 1.13 mmol/L (ref 1.13–1.30)
Creatinine, Ser: 1 mg/dL (ref 0.61–1.24)
Glucose, Bld: 155 mg/dL — ABNORMAL HIGH (ref 65–99)
HEMATOCRIT: 52 % (ref 39.0–52.0)
Hemoglobin: 17.7 g/dL — ABNORMAL HIGH (ref 13.0–17.0)
POTASSIUM: 3.3 mmol/L — AB (ref 3.5–5.1)
SODIUM: 141 mmol/L (ref 135–145)
TCO2: 27 mmol/L (ref 0–100)

## 2015-07-07 LAB — PROTIME-INR
INR: 1.15 (ref 0.00–1.49)
PROTHROMBIN TIME: 14.9 s (ref 11.6–15.2)

## 2015-07-07 LAB — CBC
HCT: 46.5 % (ref 39.0–52.0)
Hemoglobin: 15.8 g/dL (ref 13.0–17.0)
MCH: 32.2 pg (ref 26.0–34.0)
MCHC: 34 g/dL (ref 30.0–36.0)
MCV: 94.9 fL (ref 78.0–100.0)
PLATELETS: 190 10*3/uL (ref 150–400)
RBC: 4.9 MIL/uL (ref 4.22–5.81)
RDW: 12.6 % (ref 11.5–15.5)
WBC: 10.3 10*3/uL (ref 4.0–10.5)

## 2015-07-07 LAB — I-STAT TROPONIN, ED: Troponin i, poc: 0 ng/mL (ref 0.00–0.08)

## 2015-07-07 LAB — DIFFERENTIAL
BASOS ABS: 0 10*3/uL (ref 0.0–0.1)
BASOS PCT: 0 %
Eosinophils Absolute: 0.1 10*3/uL (ref 0.0–0.7)
Eosinophils Relative: 1 %
Lymphocytes Relative: 5 %
Lymphs Abs: 0.5 10*3/uL — ABNORMAL LOW (ref 0.7–4.0)
MONO ABS: 0.1 10*3/uL (ref 0.1–1.0)
Monocytes Relative: 1 %
NEUTROS ABS: 9.6 10*3/uL — AB (ref 1.7–7.7)
NEUTROS PCT: 93 %

## 2015-07-07 LAB — APTT: APTT: 32 s (ref 24–37)

## 2015-07-07 LAB — ETHANOL

## 2015-07-07 MED ORDER — METRONIDAZOLE IN NACL 5-0.79 MG/ML-% IV SOLN
500.0000 mg | Freq: Once | INTRAVENOUS | Status: AC
  Administered 2015-07-08: 500 mg via INTRAVENOUS
  Filled 2015-07-07: qty 100

## 2015-07-07 MED ORDER — CEFTRIAXONE SODIUM 1 G IJ SOLR
1.0000 g | Freq: Once | INTRAMUSCULAR | Status: AC
  Administered 2015-07-07: 1 g via INTRAVENOUS
  Filled 2015-07-07: qty 10

## 2015-07-07 MED ORDER — ACETAMINOPHEN 650 MG RE SUPP
650.0000 mg | Freq: Once | RECTAL | Status: AC
  Administered 2015-07-07: 650 mg via RECTAL

## 2015-07-07 MED ORDER — ONDANSETRON HCL 4 MG/2ML IJ SOLN
INTRAMUSCULAR | Status: AC
  Filled 2015-07-07: qty 2

## 2015-07-07 MED ORDER — NICARDIPINE HCL IN NACL 20-0.86 MG/200ML-% IV SOLN
3.0000 mg/h | INTRAVENOUS | Status: DC
  Administered 2015-07-07: 5 mg/h via INTRAVENOUS
  Filled 2015-07-07: qty 200

## 2015-07-07 MED ORDER — LABETALOL HCL 5 MG/ML IV SOLN
20.0000 mg | Freq: Once | INTRAVENOUS | Status: AC
  Administered 2015-07-07: 20 mg via INTRAVENOUS
  Filled 2015-07-07: qty 4

## 2015-07-07 MED ORDER — LABETALOL HCL 5 MG/ML IV SOLN
INTRAVENOUS | Status: AC
  Filled 2015-07-07: qty 4

## 2015-07-07 MED ORDER — DEXTROSE 5 % IV SOLN
500.0000 mg | Freq: Once | INTRAVENOUS | Status: AC
  Administered 2015-07-07: 500 mg via INTRAVENOUS
  Filled 2015-07-07: qty 500

## 2015-07-07 MED ORDER — ONDANSETRON HCL 4 MG/2ML IJ SOLN
4.0000 mg | Freq: Once | INTRAMUSCULAR | Status: AC
  Administered 2015-07-07: 4 mg via INTRAVENOUS

## 2015-07-07 MED ORDER — SODIUM CHLORIDE 0.9 % IV BOLUS (SEPSIS)
1000.0000 mL | Freq: Once | INTRAVENOUS | Status: AC
  Administered 2015-07-07: 1000 mL via INTRAVENOUS

## 2015-07-07 NOTE — ED Provider Notes (Signed)
CSN: [id number]     Arrival date & time 06/28/2015  1837 History   First MD Initiated Contact with Patient 06/19/2015 1849     No chief complaint on file.    (Consider location/radiation/quality/duration/timing/severity/associated sxs/prior Treatment) Patient is a 75 y.o. male presenting with altered mental status. The history is provided by the patient.  Altered Mental Status Presenting symptoms: behavior changes and confusion   Severity:  Moderate Most recent episode:  Today Episode history:  Single Timing:  Constant Progression:  Worsening Chronicity:  Recurrent Context comment:  History of hemorrhagic strokes, history of episodic confusion Associated symptoms: weakness   Associated symptoms: no abdominal pain, normal movement, no fever, no nausea, no rash, no visual change and no vomiting     Past Medical History  Diagnosis Date  . Hx of transient ischemic attack (TIA)   . Hyperlipidemia   . ED (erectile dysfunction)   . Personal history of kidney stones   . Subarachnoid hemorrhage ([place])     '11- hospitalized -3 to 4 days(passed out-awoke in hospital)- no residual affects.  . Myocardial infarction ([place])     12/1991  . Dysphagia   . Hypokalemia   . Hypothyroidism   . Sleep apnea     on CPAP-settings 6  . History of kidney stones 02-27-13    x5- none recent  . Throat cancer [facility])     '00-radiation therapy  . Stroke ([place])   . Unspecified cerebral artery occlusion with cerebral infarction 11/08/2012  . Episodic confusion 02-27-13    hx. of, not currently(multiple episodes- ? TIA's)-always saw MD to be evaluated.  Marland Kitchen AKI (acute kidney injury) ([place]) 02/23/2011    pt. denies any problems 02-27-13  . Hypertension     hx. labile - with spikes every 3-6 mos.-last 8'14 ED visit.  Marland Kitchen Hypertensive encephalopathy   . Headache(784.0)     episodic with periods of confusion -multiple  . Neuromuscular disorder ([place])     "parkinson's" -no tremor-sees neurology MD at [facility]  . Coronary  artery disease     Dr. [place]   Past Surgical History  Procedure Laterality Date  . Angioplasty  1993    LAD  . Cardiac catheterization    . Coronary angioplasty    . Basal cell cancer      head  . Hernia repair    . Appendectomy    . Patent foramen ovale closure  2008  . Eye surgery    . Colonoscopy N/A 06/18/2012    Procedure: COLONOSCOPY;  Surgeon: Jeryl Columbia, MD;  Location: [facility] ENDOSCOPY;  Service: Endoscopy;  Laterality: N/A;  h&p in file-[place]   . Hot hemostasis N/A 06/18/2012    Procedure: HOT HEMOSTASIS (ARGON PLASMA COAGULATION/BICAP);  Surgeon: Jeryl Columbia, MD;  Location: [facility] ENDOSCOPY;  Service: Endoscopy;  Laterality: N/A;  . Colonoscopy with propofol N/A 03/07/2013    Procedure: COLONOSCOPY WITH PROPOFOL;  Surgeon: Jeryl Columbia, MD;  Location: [facility] ENDOSCOPY;  Service: Endoscopy;  Laterality: N/A;   Family History  Problem Relation Age of Onset  . Heart failure Father   . Heart attack Brother   . Anemia Brother 46    mi   Social History  Substance Use Topics  . Smoking status: Former Smoker    Types: Cigarettes    Quit date: 09/21/1990  . Smokeless tobacco: Never Used  . Alcohol Use: No    Review of Systems  Constitutional: Negative for fever.  HENT: Negative for congestion and rhinorrhea.  Eyes: Negative for visual disturbance.  Respiratory: Negative for cough, shortness of breath and wheezing.   Cardiovascular: Negative for chest pain and leg swelling.  Gastrointestinal: Negative for nausea, vomiting, abdominal pain and diarrhea.  Genitourinary: Negative for flank pain.  Musculoskeletal: Negative for neck pain and neck stiffness.  Skin: Negative for rash.  Neurological: Positive for weakness.  Psychiatric/Behavioral: Positive for confusion and decreased concentration.      Allergies  Review of patient's allergies indicates no known allergies.  Home Medications   Prior to Admission medications   Medication Sig Start Date End Date Taking?  Authorizing Provider  Ascorbic Acid (VITAMIN C PO) Take 1 tablet by mouth daily.   Yes Historical Provider, MD  aspirin 325 MG tablet Take 325 mg by mouth daily.   Yes Historical Provider, MD  chlorthalidone (HYGROTON) 25 MG tablet Take 25 mg by mouth daily.   Yes Historical Provider, MD  cloNIDine (CATAPRES) 0.1 MG tablet Take 0.1 mg by mouth as needed (When BP >160/95).   Yes Historical Provider, MD  ENSURE (ENSURE) Take 237 mLs by mouth daily.   Yes Historical Provider, MD  fish oil-omega-3 fatty acids 1000 MG capsule Take 1 g by mouth daily.    Yes Historical Provider, MD  GLUCOSAMINE HCL PO Take 1 tablet by mouth daily. Reported on 04/15/2015   Yes Historical Provider, MD  KLOR-CON M10 10 MEQ tablet Take 10 mEq by mouth daily. 05/12/14  Yes Historical Provider, MD  labetalol (NORMODYNE) 100 MG tablet Take 1 tablet (100 mg total) by mouth 2 (two) times daily. 04/16/15  Yes Barton Dubois, MD  levothyroxine (SYNTHROID, LEVOTHROID) 112 MCG tablet Take 112 mcg by mouth daily.  12/11/13  Yes Historical Provider, MD  lovastatin (MEVACOR) 20 MG tablet Take 20 mg by mouth 2 (two) times daily.   Yes Historical Provider, MD  Maltodextrin-Xanthan Gum (RESOURCE THICKENUP CLEAR) POWD Take 1 g by mouth as needed. 02/03/14  Yes Marianne L York, PA-C  Multiple Vitamin (MULITIVITAMIN WITH MINERALS) TABS Take 1 tablet by mouth daily.     Yes Historical Provider, MD   BP 99/81 mmHg  Pulse 74  Temp(Src) 99.7 F (37.6 C) (Rectal)  Resp 14  Ht 6' (1.829 m)  Wt 68.8 kg  BMI 20.57 kg/m2  SpO2 99% Physical Exam  Constitutional:  Chronically ill-appearing, altered male in no acute distress  HENT:  Head: Normocephalic and atraumatic.  Mouth/Throat: No oropharyngeal exudate.  Eyes: Conjunctivae are normal. Pupils are equal, round, and reactive to light.  Neck: Normal range of motion. Neck supple.  Cardiovascular: Normal rate, regular rhythm, normal heart sounds and intact distal pulses.  Exam reveals no friction  rub.   No murmur heard. Pulmonary/Chest: Effort normal and breath sounds normal. No respiratory distress. He has no wheezes. He has no rales.  Abdominal: Soft. He exhibits no distension. There is no tenderness.  Musculoskeletal: He exhibits no edema.  Neurological:  Drowsy with intermittent expressive aphasia, although difficult to assess due to general confusion. Oriented to person only. Moves all extremities with at least antigravity strength. Endorses normal sensation in bilateral upper and lower extremities.  Skin: Skin is warm.  Nursing note and vitals reviewed.   ED Course  .Intubation Date/Time: 07/08/2015 2:16 AM Performed by: Duffy Bruce Authorized by: Duffy Bruce Consent: The procedure was performed in an emergent situation. Patient identity confirmed: arm band Time out: Immediately prior to procedure a "time out" was called to verify the correct patient, procedure, equipment, support staff and  site/side marked as required. Indications: respiratory distress and  airway protection Intubation method: video-assisted Patient status: paralyzed (RSI) Preoxygenation: nonrebreather mask Sedatives: etomidate Paralytic: succinylcholine Laryngoscope size: Mac 4 Tube size: 8.0 mm Number of attempts: 1 Cords visualized: yes Post-procedure assessment: chest rise and ETCO2 monitor Breath sounds: equal Cuff inflated: yes Tube secured with: ETT holder Chest x-ray interpreted by me. Chest x-ray findings: endotracheal tube in appropriate position Patient tolerance: Patient tolerated the procedure well with no immediate complications   (including critical care time) Labs Review Labs Reviewed  DIFFERENTIAL - Abnormal; Notable for the following:    Neutro Abs 9.6 (*)    Lymphs Abs 0.5 (*)    All other components within normal limits  COMPREHENSIVE METABOLIC PANEL - Abnormal; Notable for the following:    Potassium 3.4 (*)    Glucose, Bld 159 (*)    BUN 23 (*)    All other  components within normal limits  BASIC METABOLIC PANEL - Abnormal; Notable for the following:    Glucose, Bld 125 (*)    BUN 23 (*)    Calcium 8.3 (*)    All other components within normal limits  MAGNESIUM - Abnormal; Notable for the following:    Magnesium 1.5 (*)    All other components within normal limits  PHOSPHORUS - Abnormal; Notable for the following:    Phosphorus 2.0 (*)    All other components within normal limits  TROPONIN I - Abnormal; Notable for the following:    Troponin I 0.05 (*)    All other components within normal limits  I-STAT CHEM 8, ED - Abnormal; Notable for the following:    Potassium 3.3 (*)    Chloride 100 (*)    BUN 28 (*)    Glucose, Bld 155 (*)    Hemoglobin 17.7 (*)    All other components within normal limits  I-STAT VENOUS BLOOD GAS, ED - Abnormal; Notable for the following:    pH, Ven 7.339 (*)    pCO2, Ven 54.5 (*)    pO2, Ven 14.0 (*)    Bicarbonate 29.3 (*)    All other components within normal limits  CBG MONITORING, ED - Abnormal; Notable for the following:    Glucose-Capillary 138 (*)    All other components within normal limits  I-STAT ARTERIAL BLOOD GAS, ED - Abnormal; Notable for the following:    pO2, Arterial 50.0 (*)    All other components within normal limits  I-STAT ARTERIAL BLOOD GAS, ED - Abnormal; Notable for the following:    pO2, Arterial 318.0 (*)    Bicarbonate 25.5 (*)    All other components within normal limits  CULTURE, BLOOD (ROUTINE X 2)  CULTURE, BLOOD (ROUTINE X 2)  CULTURE, RESPIRATORY (NON-EXPECTORATED)  MRSA PCR SCREENING  ETHANOL  PROTIME-INR  APTT  CBC  CBC  LACTIC ACID, PLASMA  TSH  PROCALCITONIN  GLUCOSE, CAPILLARY  URINE RAPID DRUG SCREEN, HOSP PERFORMED  URINALYSIS, ROUTINE W REFLEX MICROSCOPIC (NOT AT [facility])  BLOOD GAS, ARTERIAL  BLOOD GAS, ARTERIAL  I-STAT TROPOININ, ED  I-STAT CG4 LACTIC ACID, ED  I-STAT VENOUS BLOOD GAS, ED  I-STAT CG4 LACTIC ACID, ED    Imaging Review Ct Head  Wo Contrast  07/08/2015  CLINICAL DATA:  Change in mental status. EXAM: CT HEAD WITHOUT CONTRAST TECHNIQUE: Contiguous axial images were obtained from the base of the skull through the vertex without intravenous contrast. COMPARISON:  CT 5-1/2 hours prior.  Brain MRI 03/13/2015 FINDINGS: No change  from prior exam. Advanced cerebral and cerebellar atrophy again seen. Advanced chronic small vessel ischemia. No evidence of developing infarct. No interval hemorrhage. Unchanged opacification of the left maxillary sinus, ethmoid air cell and frontal sinus to lesser extent. IMPRESSION: Advanced atrophy and chronic small vessel ischemia. No change from exam 5 hours prior. These results will be called to the ordering clinician or representative by the Radiologist Assistant, and communication documented in the PACS or zVision Dashboard. Electronically Signed   By: Jeb Levering M.D.   On: 07/08/2015 00:52   Ct Head Wo Contrast  06/28/2015  CLINICAL DATA:  Acute onset of confusion and aphasia.  Code stroke. EXAM: CT HEAD WITHOUT CONTRAST TECHNIQUE: Contiguous axial images were obtained from the base of the skull through the vertex without intravenous contrast. COMPARISON:  03/13/2015 FINDINGS: The brain shows advanced generalized atrophy. There are extensive chronic small vessel ischemic changes throughout the white matter. No identifiable acute infarction, mass lesion, hemorrhage, hydrocephalus or extra-axial collection. No calvarial abnormality. Sinuses are clear except for the left maxillary sinus which is opacified. There is atherosclerotic calcification of the major vessels at the base of the brain. IMPRESSION: No acute finding by CT. Advanced generalized brain atrophy and chronic small vessel ischemic changes. These results were called by telephone at the time of interpretation on 07/09/2015 at 6:54 pm to Dr. Gareth Morgan , who verbally acknowledged these results. Electronically Signed   By: Nelson Chimes M.D.    On: 06/21/2015 18:57   Dg Chest Portable 1 View  07/08/2015  CLINICAL DATA:  Post intubation EXAM: PORTABLE CHEST 1 VIEW COMPARISON:  07/10/2015 FINDINGS: Interval placement of an endotracheal tube with tip measuring 3.6 cm above the carina. Enteric tube tip is not visualized but is below the left hemidiaphragm, likely in the distal stomach. Borderline heart size with normal pulmonary vascularity. Focal airspace consolidation in the left mid lung as before. Developing consolidation in the right upper lung. No blunting of costophrenic angles. No pneumothorax. Calcification of the aorta. IMPRESSION: Appliances appear in satisfactory position. Focal consolidation in the left mid lung with developing consolidation in the right upper lung. Electronically Signed   By: Lucienne Capers M.D.   On: 07/08/2015 01:12   Dg Chest Port 1 View  07/17/2015  CLINICAL DATA:  Pneumonia.  Shortness of breath. EXAM: PORTABLE CHEST 1 VIEW COMPARISON:  Earlier same day.  04/15/2015. FINDINGS: Compared to the study of only 4 hours ago, there is rapidly worsening infiltrate in the left lower lobe. This raises the possibility of aspiration. Right chest appears clear in this projection. Presumed Watchman device projects over the heart. IMPRESSION: Development of rapid airspace filling in the left lower lobe consistent with pneumonia. Rapid development suggests possible aspiration. Electronically Signed   By: Nelson Chimes M.D.   On: 07/17/2015 23:49   Dg Chest Portable 1 View  07/08/2015  CLINICAL DATA:  Code stroke.  Altered mental status. EXAM: PORTABLE CHEST 1 VIEW COMPARISON:  04/15/2015 FINDINGS: The patient has taken a poor inspiration. The heart is at the upper limits of normal in size. There is calcification of the thoracic aorta. Accentuated markings at the lung bases could relate to the poor inspiration. Mild basilar pneumonia not excluded on this portable exam. The upper lungs are clear. No evidence of heart failure or  effusion. IMPRESSION: Poor inspiration. Prominent markings at the bases that could relate to the poor inspiration or could be secondary to basilar pneumonia. Electronically Signed   By: Nelson Chimes  M.D.   On: 07/18/2015 19:45   I have personally reviewed and evaluated these images and lab results as part of my medical decision-making.   MDM   75 yo M with PMHx of HTN, HLD, CVA (hemorrhagic), CAD s/p MI who presents with AMS, hypertension. CODE STROKE activated prior to arrival. On assessment, pt confused but protecting airway, with mild aphasia, confusion but no focal neuro deficits. Taken to CT scanner with Neuro. CT Head negative, POC glucose WNL. On re-assessment, pt noted to have marked HTN. Per discussion with pt's wife, who has now arrived, pt has h/o hypertensive encephalopathy as well as intubation due to AMS in the past. Neuro has reviewed CT, examined pt and recommends BP control for likely HTN emergency. Will send broad labs for AMS work-up, monitor closely. No seizure-like activity.  BP dropped markedly after IV labetalol, cardene drip ordered by Neurology. Will hold on cardene drip with gaol SBP reduction of 25%. Per wife, pt vomiting earlier due to AMS. CXR c/f PNA. Will treat for CAP as pt has been out of hospital, monitor closely. Otherwise, labs as above. CBC shows no leukocytosis or anemia. CMP with mild hypokalemia - will replete once able to tolerate PO. Trop negative and EKG non-ischemic. Will admit to step-down.  While awaiting step-down bed, pt noted to acutely decompensate with increasing O2 requirement and AMS. Pt now febrile. Will add flagyl for anaerobic coverage. Discussed case with ICU. Given concern for aspiration with repeat CXR showing new effusion, worsening mental status, now GCS 8-9, pt intubated for airway protection. Pt covered for aspiration as above and 30 cc/kg fluid given. BP improved. Repeat CT Head shows no acute hemorrhage. Will admit to ICU for management of  acute encephalopathy with concern for aspiration PNA and hypoxic respiratory failure. Wife was aware of critical illness prior to her leaving - ICU to contact. Otherwise, repeat ABG improving after intubation. Pt HDS.   Clinical Impression: 1. Acute respiratory failure with hypoxia ([place])   2. SOB (shortness of breath)   3. Altered mental status   4. Aspiration pneumonia of both lower lobes due to vomit ([place])   5. Hypertensive emergency   6. Hypokalemia   7. Hypertensive encephalopathy   8. Confusion     Disposition: Admit  Condition: Critical but stable  Pt seen in conjunction with Dr. Erin Sons, MD 07/08/15 [id number]  Gareth Morgan, MD 07/08/15 [place], MD 07/22/15 [phone]

## 2015-07-07 NOTE — ED Notes (Signed)
Pt wife came to desk and stated husband was having a stroke- assisted patient out of car, pt was alert, does not follow commands, garbled speech, LSN 1300. Wife states he has a history of hemorrhagic stroke in the past with similar symptoms. Pt appears to be moving all extremities. Wife reports patient baseline is alert and oriented with clear speech. Code stroke paged. MD Schlossman screened patient prior to CT.

## 2015-07-07 NOTE — ED Notes (Signed)
CBG 162 

## 2015-07-07 NOTE — Progress Notes (Signed)
Page from ED for code Stroke. Upon arrival patient confused and slurred speech.  Patient BP 212/140, CBG 155 , wife arrived shortly after to bedside.  Patient wife reports that the patient has had several episodes like this before when his blood pressure is elevated and a history of hemorrhagic stroke.  Wife reports that she gave him his medication this afternoon.  LKW was 1300, IV access placed and patient started on Cardene gtt for bp control.  MD Derald Macleod to clear  CODE stroke and bp control for HTN crisis.  Handoff to nurse Roselyn Reef.

## 2015-07-07 NOTE — ED Notes (Signed)
RN to room to check on pt. PT more altered. Hot to touch centrally, very cold peripherally, rectal temp and CBG taken. Pt responding to stimulation only, increased WOB. Dr. Buelah Manis and Dr. Billy Fischer informed. RT called to bedside.

## 2015-07-07 NOTE — Consult Note (Signed)
Requesting Physician: Dr.  Billy Fischer    Reason for consultation:  Stroke code, altered mental status  HPI:                                                                                                                                         Benjamin Park is an 75 y.o. male patient with baseline Parkinson's disease, dementia, multiple prior ER visits for altered mental status in the setting of severe hypertension , was brought in by his wife to the emergency room for worsening hypertension, confusion and altered mental status. She reported that she had seen similar symptoms on multiple occasions over the past 10+ years all of which are in the setting of severe hypertension, some of them required intubation, was noted to have subarachnoid hemorrhage in the past with one of these episodes. He was last admitted in December 2016 with similar presentation, noted to have a tiny embolic infarct in the parietal vertex, incidental finding. On aspirin and Plavix at home.  Patient is confused and unable to provide any history. Information obtained from his wife and review of EMR  Date last known well:  2017, April 19th Time last known well:  Unknown tPA Given: No: Unknown time of onset, grossly nonfocal exam, prior history of SAH  Stroke Risk Factors - hypertension  Past Medical History: Past Medical History  Diagnosis Date  . Hx of transient ischemic attack (TIA)   . Hyperlipidemia   . ED (erectile dysfunction)   . Personal history of kidney stones   . Subarachnoid hemorrhage ([place])     '11- hospitalized -3 to 4 days(passed out-awoke in hospital)- no residual affects.  . Myocardial infarction ([place])     12/1991  . Dysphagia   . Hypokalemia   . Hypothyroidism   . Sleep apnea     on CPAP-settings 6  . History of kidney stones 02-27-13    x5- none recent  . Throat cancer [facility])     '00-radiation therapy  . Stroke ([place])   . Unspecified cerebral artery occlusion with cerebral infarction  11/08/2012  . Episodic confusion 02-27-13    hx. of, not currently(multiple episodes- ? TIA's)-always saw MD to be evaluated.  Marland Kitchen AKI (acute kidney injury) ([place]) 02/23/2011    pt. denies any problems 02-27-13  . Hypertension     hx. labile - with spikes every 3-6 mos.-last 8'14 ED visit.  Marland Kitchen Hypertensive encephalopathy   . Headache(784.0)     episodic with periods of confusion -multiple  . Neuromuscular disorder ([place])     "parkinson's" -no tremor-sees neurology MD at [facility]  . Coronary artery disease     Dr. [place]    Past Surgical History  Procedure Laterality Date  . Angioplasty  1993    LAD  . Cardiac catheterization    . Coronary angioplasty    . Basal cell cancer  head  . Hernia repair    . Appendectomy    . Patent foramen ovale closure  2008  . Eye surgery    . Colonoscopy N/A 06/18/2012    Procedure: COLONOSCOPY;  Surgeon: Jeryl Columbia, MD;  Location: [facility] ENDOSCOPY;  Service: Endoscopy;  Laterality: N/A;  h&p in file-[place]   . Hot hemostasis N/A 06/18/2012    Procedure: HOT HEMOSTASIS (ARGON PLASMA COAGULATION/BICAP);  Surgeon: Jeryl Columbia, MD;  Location: [facility] ENDOSCOPY;  Service: Endoscopy;  Laterality: N/A;  . Colonoscopy with propofol N/A 03/07/2013    Procedure: COLONOSCOPY WITH PROPOFOL;  Surgeon: Jeryl Columbia, MD;  Location: [facility] ENDOSCOPY;  Service: Endoscopy;  Laterality: N/A;    Family History: Family History  Problem Relation Age of Onset  . Heart failure Father   . Heart attack Brother   . Anemia Brother 74    mi    Social History:   reports that he quit smoking about 24 years ago. His smoking use included Cigarettes. He has never used smokeless tobacco. He reports that he does not drink alcohol or use illicit drugs.  Allergies:  No Known Allergies   Medications:                                                                                                                         Current facility-administered medications:  .  nicardipine (CARDENE)  20mg  in 0.86% saline 230ml IV infusion (0.1 mg/ml), 3-15 mg/hr, Intravenous, Continuous, Kyleigh Nannini Fuller Mandril, MD  Current outpatient prescriptions:  .  Ascorbic Acid (VITAMIN C PO), Take 1 tablet by mouth daily., Disp: , Rfl:  .  aspirin 325 MG tablet, Take 325 mg by mouth daily., Disp: , Rfl:  .  aspirin 81 MG tablet, Take 1 tablet (81 mg total) by mouth daily., Disp: , Rfl:  .  atorvastatin (LIPITOR) 20 MG tablet, Take 1 tablet (20 mg total) by mouth daily at 6 PM. (Patient not taking: Reported on 04/15/2015), Disp: 30 tablet, Rfl: 1 .  chlorthalidone (HYGROTON) 25 MG tablet, Take 25 mg by mouth daily., Disp: , Rfl:  .  clopidogrel (PLAVIX) 75 MG tablet, Take 1 tablet (75 mg total) by mouth daily. (Patient not taking: Reported on 04/15/2015), Disp: 30 tablet, Rfl: 0 .  ENSURE (ENSURE), Take 237 mLs by mouth daily., Disp: , Rfl:  .  fish oil-omega-3 fatty acids 1000 MG capsule, Take 1 g by mouth daily. , Disp: , Rfl:  .  GLUCOSAMINE HCL PO, Take 1 tablet by mouth daily. Reported on 04/15/2015, Disp: , Rfl:  .  KLOR-CON M10 10 MEQ tablet, Take 10 mEq by mouth daily., Disp: , Rfl:  .  labetalol (NORMODYNE) 100 MG tablet, Take 1 tablet (100 mg total) by mouth 2 (two) times daily., Disp: 60 tablet, Rfl: 1 .  labetalol (NORMODYNE) 200 MG tablet, Take 200 mg by mouth 2 (two) times daily., Disp: , Rfl:  .  levothyroxine (SYNTHROID, LEVOTHROID) 112 MCG tablet, Take  112 mcg by mouth daily. , Disp: , Rfl:  .  lovastatin (MEVACOR) 20 MG tablet, Take 20 mg by mouth 2 (two) times daily., Disp: , Rfl:  .  Maltodextrin-Xanthan Gum (RESOURCE THICKENUP CLEAR) POWD, Take 1 g by mouth as needed., Disp: 1 Can, Rfl: 6 .  Multiple Vitamin (MULITIVITAMIN WITH MINERALS) TABS, Take 1 tablet by mouth daily.  , Disp: , Rfl:    ROS:                                                                                                                                       History    unobtainable from patient due to mental  status  Neurologic Examination:                                                                                                    Today's Vitals   06/30/2015 1850 07/13/2015 1913  BP: 211/134   Pulse: 102   Temp: 97.5 F (36.4 C)   TempSrc: Oral   Resp: 18   PainSc:  3     Evaluation of higher integrative functions including: Level of alertness: Alert,  Oriented to place and person Speech: Very hard of hearing. He was able to speak one to 2 word sentences which was fluent but has difficulty making him follow commands Test the following cranial nerves: 2-12 grossly appear intact Motor examination: Able to move all 4 extremities spontaneously and antigravity symmetrically , not cooperative for resistance testing  Examination of sensation : Unable to assess, except that he withdraws to stimulation symmetrically bilaterally  Test coordination: no abnormal involuntary movements   Lab Results: Basic Metabolic Panel:  Recent Labs Lab 06/30/2015 1852  NA 141  K 3.3*  CL 100*  GLUCOSE 155*  BUN 28*  CREATININE 1.00    Liver Function Tests: No results for input(s): AST, ALT, ALKPHOS, BILITOT, PROT, ALBUMIN in the last 168 hours. No results for input(s): LIPASE, AMYLASE in the last 168 hours. No results for input(s): AMMONIA in the last 168 hours.  CBC:  Recent Labs Lab 06/19/2015 1843 06/24/2015 1852  WBC 10.3  --   NEUTROABS 9.6*  --   HGB 15.8 17.7*  HCT 46.5 52.0  MCV 94.9  --   PLT 190  --     Cardiac Enzymes: No results for input(s): CKTOTAL, CKMB, CKMBINDEX, TROPONINI in the last 168 hours.  Lipid Panel: No results for input(s): CHOL, TRIG, HDL, CHOLHDL, VLDL, LDLCALC in the last  168 hours.  CBG: No results for input(s): GLUCAP in the last 168 hours.  Microbiology: Results for orders placed or performed during the hospital encounter of 03/11/15  Blood culture (routine x 2)     Status: None   Collection Time: 03/11/15  3:00 PM  Result Value Ref Range Status    Specimen Description BLOOD RIGHT ANTECUBITAL  Final   Special Requests BOTTLES DRAWN AEROBIC AND ANAEROBIC 10CC  Final   Culture NO GROWTH 5 DAYS  Final   Report Status 03/16/2015 FINAL  Final  Blood culture (routine x 2)     Status: None   Collection Time: 03/11/15  3:05 PM  Result Value Ref Range Status   Specimen Description BLOOD RIGHT ARM  Final   Special Requests IN PEDIATRIC BOTTLE 4CC  Final   Culture NO GROWTH 5 DAYS  Final   Report Status 03/16/2015 FINAL  Final  Urine culture     Status: None   Collection Time: 03/11/15  3:32 PM  Result Value Ref Range Status   Specimen Description URINE, CATHETERIZED  Final   Special Requests NONE  Final   Culture NO GROWTH 2 DAYS  Final   Report Status 03/13/2015 FINAL  Final  MRSA PCR Screening     Status: None   Collection Time: 03/11/15  9:47 PM  Result Value Ref Range Status   MRSA by PCR NEGATIVE NEGATIVE Final    Comment:        The GeneXpert MRSA Assay (FDA approved for NASAL specimens only), is one component of a comprehensive MRSA colonization surveillance program. It is not intended to diagnose MRSA infection nor to guide or monitor treatment for MRSA infections.      Imaging: Ct Head Wo Contrast  06/20/2015  CLINICAL DATA:  Acute onset of confusion and aphasia.  Code stroke. EXAM: CT HEAD WITHOUT CONTRAST TECHNIQUE: Contiguous axial images were obtained from the base of the skull through the vertex without intravenous contrast. COMPARISON:  03/13/2015 FINDINGS: The brain shows advanced generalized atrophy. There are extensive chronic small vessel ischemic changes throughout the white matter. No identifiable acute infarction, mass lesion, hemorrhage, hydrocephalus or extra-axial collection. No calvarial abnormality. Sinuses are clear except for the left maxillary sinus which is opacified. There is atherosclerotic calcification of the major vessels at the base of the brain. IMPRESSION: No acute finding by CT. Advanced  generalized brain atrophy and chronic small vessel ischemic changes. These results were called by telephone at the time of interpretation on 07/18/2015 at 6:54 pm to Dr. Gareth Morgan , who verbally acknowledged these results. Electronically Signed   By: Nelson Chimes M.D.   On: 07/01/2015 18:57      Assessment and plan:   ASSAEL LOEFFELHOLZ is an 75 y.o. male patient who was brought into the ER by his wife due to worsening confusion. She reported having similar symptoms of altered mental status and confusion on multiple occasions in the past in setting of severe hypertension. His systolic blood pressure in the ER was 213 , he started vomiting while in the CT scanner room. CT of the head did not show any evidence of acute pathology or intracranial hemorrhage. Other than difficult following commands, grossly nonfocal neurological evaluation. His wife reported that this is not uncommon for him to have trouble following commands and confusion in the setting of severe hypertension and she has seen this happen over the past 10+ years in multiple locations. Given labetalol 20 mg one dose in the ER  and started on nicardipine drip for hypertension control. Defer further management of hypertension to the ER physician and the admitting hospitalist. No further neurodiagnostic evaluation acutely tonight. Can continue his home medications including aspirin and Plavix.  We'll follow-up.

## 2015-07-07 NOTE — Consult Note (Addendum)
History and Physical    Benjamin Park [id number] DOB: [birth year] DOA: 06/28/2015  Referring MD/NP/PA: Ellender Hose  PCP: No primary care provider on file. (Confirm with patient/family/NH records and if not entered, this has to be entered at [facility] point of entry) Outpatient Specialists:   Cardiology Peter Martinique  [place]    Patient coming from: home  Chief Complaint: confusion  HPI: Benjamin Park is a 75 y.o. male with medical history significant of Parkinson disease, dementia,  labiale HTN with previous admissions for hypertensive urgency and prior hemorrhagic stroke. Presented with confusionless for less than 24 hours  was brought in by his wife. He was having severe  garbled speech last time seen normal at 1 PM. Wife was concerned because he has  had prior hemorrhagic stroke with a similar presentation. Nothing was making this better.  She denies localized neurological complaints. In his usual self patient is alert and oriented with normal speech. Patient was noted to have SBP elevated up to 165 she gave him his home meds but he vomited BP went up to 200 she tried to give him clonidine but he vomited again. He was endorsing headache and seeing spots. No fever no chills, last wee he lost voice and had cough. His PCP started him on antibiotics wife not sure what is the name but he completed course. Has hx of aspiration and coughs when eating.  Wife currently having URI Of note in December 2016 he had also similar presentation at that time he was found to tiny embolic infarct in the parietal vertex     Status of chronic medical problems: Patient has hx of tremors was seen by neurology in the past Has poorly controlled labile hypertension with occasional episodes of orthostatic hypotension with recurent episodes of confusion. Mode history of throat cancer status post radiation therapy in 2000 wife thinks that  He episodes of hypertension started after radiation therapy. Streator of  hemorrhagic stroke as well as thrombolytic stroke in 2014  Sp [place] closure [place] with a cain.  ED Course: Code stroke was called ER provide a spoke to Dr. Silverio Decamp who felt the patient Fredderick Severance have hypertensive urgency worrisome for press he was started on nicardipine drip but blood pressure drift down rapidly in the Cardene drip was stopped. CT head showed no acute findings chest x-ray could not: Possible by basilar pneumonia although less likely. He was started on Zithromax and Rocephin. Given labetalol 20 mg for elevated blood pressure with good results. MRI that patient could not tolerate On arrival CBG 162  On repeat evaluation patient became poorly responsive foaming at the mouth blood pressure started to trend down as low as 90s over 60s. Repeat ABG showing hypoxia but no evidence of hypercarbia chest x-ray showing new aspiration pneumonia. Discuss case with [facility] M who recommends at this point intubation will admit to ICU Review of Systems: headache, confusion nausea vomiting As per HPI otherwise 10 point review of systems negative.    Past Medical History  Diagnosis Date  . Hx of transient ischemic attack (TIA)   . Hyperlipidemia   . ED (erectile dysfunction)   . Personal history of kidney stones   . Subarachnoid hemorrhage ([place])     '11- hospitalized -3 to 4 days(passed out-awoke in hospital)- no residual affects.  . Myocardial infarction ([place])     12/1991  . Dysphagia   . Hypokalemia   . Hypothyroidism   . Sleep apnea     on CPAP-settings 6  .  History of kidney stones 02-27-13    x5- none recent  . Throat cancer [facility])     '00-radiation therapy  . Stroke ([place])   . Unspecified cerebral artery occlusion with cerebral infarction 11/08/2012  . Episodic confusion 02-27-13    hx. of, not currently(multiple episodes- ? TIA's)-always saw MD to be evaluated.  Marland Kitchen AKI (acute kidney injury) ([place]) 02/23/2011    pt. denies any problems 02-27-13  . Hypertension     hx. labile - with spikes  every 3-6 mos.-last 8'14 ED visit.  Marland Kitchen Hypertensive encephalopathy   . Headache(784.0)     episodic with periods of confusion -multiple  . Neuromuscular disorder ([place])     "parkinson's" -no tremor-sees neurology MD at [facility]  . Coronary artery disease     Dr. Martinique    Past Surgical History  Procedure Laterality Date  . Angioplasty  1993    LAD  . Cardiac catheterization    . Coronary angioplasty    . Basal cell cancer      head  . Hernia repair    . Appendectomy    . Patent foramen ovale closure  2008  . Eye surgery    . Colonoscopy N/A 06/18/2012    Procedure: COLONOSCOPY;  Surgeon: Jeryl Columbia, MD;  Location[facility] ENDOSCOPY;  Service: Endoscopy;  Laterality: N/A;  h&p in file-[place]   . Hot hemostasis N/A 06/18/2012    Procedure: HOT HEMOSTASIS (ARGON PLASMA COAGULATION/BICAP);  Surgeon: Jeryl Columbia, MD;  Location: [facility] ENDOSCOPY;  Service: Endoscopy;  Laterality: N/A;  . Colonoscopy with propofol N/A 03/07/2013    Procedure: COLONOSCOPY WITH PROPOFOL;  Surgeon: Jeryl Columbia, MD;  Location: [facility] ENDOSCOPY;  Service: Endoscopy;  Laterality: N/A;     reports that he quit smoking about 24 years ago. His smoking use included Cigarettes. He has never used smokeless tobacco. He reports that he does not drink alcohol or use illicit drugs.  No Known Allergies  Family History  Problem Relation Age of Onset  . Heart failure Father   . Heart attack Brother   . Anemia Brother 6    mi      Prior to Admission medications   Medication Sig Start Date End Date Taking? Authorizing Provider  Ascorbic Acid (VITAMIN C PO) Take 1 tablet by mouth daily.    Historical Provider, MD  aspirin 325 MG tablet Take 325 mg by mouth daily.    Historical Provider, MD  aspirin 81 MG tablet Take 1 tablet (81 mg total) by mouth daily. 04/16/15   Barton Dubois, MD  atorvastatin (LIPITOR) 20 MG tablet Take 1 tablet (20 mg total) by mouth daily at 6 PM. Patient not taking: Reported on 04/15/2015 03/18/15   Kelvin Cellar, MD  chlorthalidone (HYGROTON) 25 MG tablet Take 25 mg by mouth daily.    Historical Provider, MD  clopidogrel (PLAVIX) 75 MG tablet Take 1 tablet (75 mg total) by mouth daily. Patient not taking: Reported on 04/15/2015 03/18/15   Kelvin Cellar, MD  ENSURE (ENSURE) Take 237 mLs by mouth daily.    Historical Provider, MD  fish oil-omega-3 fatty acids 1000 MG capsule Take 1 g by mouth daily.     Historical Provider, MD  GLUCOSAMINE HCL PO Take 1 tablet by mouth daily. Reported on 04/15/2015    Historical Provider, MD  KLOR-CON M10 10 MEQ tablet Take 10 mEq by mouth daily. 05/12/14   Historical Provider, MD  labetalol (NORMODYNE) 100 MG tablet Take 1 tablet (100  mg total) by mouth 2 (two) times daily. 04/16/15   Barton Dubois, MD  labetalol (NORMODYNE) 200 MG tablet Take 200 mg by mouth 2 (two) times daily.    Historical Provider, MD  levothyroxine (SYNTHROID, LEVOTHROID) 112 MCG tablet Take 112 mcg by mouth daily.  12/11/13   Historical Provider, MD  lovastatin (MEVACOR) 20 MG tablet Take 20 mg by mouth 2 (two) times daily.    Historical Provider, MD  Maltodextrin-Xanthan Gum (RESOURCE THICKENUP CLEAR) POWD Take 1 g by mouth as needed. 02/03/14   Melton Alar, PA-C  Multiple Vitamin (MULITIVITAMIN WITH MINERALS) TABS Take 1 tablet by mouth daily.      Historical Provider, MD    Physical Exam: Filed Vitals:   07/08/2015 1930 06/19/2015 1930 06/22/2015 2000 07/06/2015 2021  BP: 146/124 159/105 132/108 160/111  Pulse: 93 80 84   Temp:      TempSrc:      Resp: 33  17   SpO2: 90% 97% 97%       Constitutional: NAD, calm, comfortable Filed Vitals:   07/09/2015 1930 07/01/2015 1930 07/04/2015 2000 07/14/2015 2021  BP: 146/124 159/105 132/108 160/111  Pulse: 93 80 84   Temp:      TempSrc:      Resp: 33  17   SpO2: 90% 97% 97%    Eyes: PERRL, lids and conjunctivae normal ENMT: Mucous membranes are moist. Posterior pharynx clear of any exudate or lesions.Normal dentition.  Neck: normal, supple, no  masses, no thyromegaly Respiratory: clear to auscultation bilaterally, no wheezing, no crackles. Normal respiratory effort. No accessory muscle use.  Cardiovascular: Regular rate and rhythm, no murmurs / rubs / gallops. No extremity edema. 2+ pedal pulses. No carotid bruits.  Abdomen: no tenderness, no masses palpated. No hepatosplenomegaly. Bowel sounds positive.  Musculoskeletal: no clubbing / cyanosis. No joint deformity upper and lower extremities. Good ROM, no contractures. Normal muscle tone.  Skin: no rashes, lesions, ulcers. No induration mottled pale appearing Neurologic: Not following commands unable to perform Psychiatric: Patient is arousable to sternal rub opens eyes but not following any commands     Labs on Admission: I have personally reviewed following labs and imaging studies  CBC:  Recent Labs Lab 06/24/2015 1843 07/03/2015 1852  WBC 10.3  --   NEUTROABS 9.6*  --   HGB 15.8 17.7*  HCT 46.5 52.0  MCV 94.9  --   PLT 190  --    Basic Metabolic Panel:  Recent Labs Lab 06/26/2015 1843 06/24/2015 1852  NA 139 141  K 3.4* 3.3*  CL 102 100*  CO2 25  --   GLUCOSE 159* 155*  BUN 23* 28*  CREATININE 1.09 1.00  CALCIUM 9.4  --    GFR: CrCl cannot be calculated (Unknown ideal weight.). Liver Function Tests:  Recent Labs Lab 07/18/2015 1843  AST 21  ALT 20  ALKPHOS 77  BILITOT 0.8  PROT 7.9  ALBUMIN 3.9   No results for input(s): LIPASE, AMYLASE in the last 168 hours. No results for input(s): AMMONIA in the last 168 hours. Coagulation Profile:  Recent Labs Lab 06/27/2015 1843  INR 1.15   Cardiac Enzymes: No results for input(s): CKTOTAL, CKMB, CKMBINDEX, TROPONINI in the last 168 hours. BNP (last 3 results) No results for input(s): PROBNP in the last 8760 hours. HbA1C: No results for input(s): HGBA1C in the last 72 hours. CBG: No results for input(s): GLUCAP in the last 168 hours. Lipid Profile: No results for input(s): CHOL, HDL, LDLCALC,  TRIG,  CHOLHDL, LDLDIRECT in the last 72 hours. Thyroid Function Tests: No results for input(s): TSH, T4TOTAL, FREET4, T3FREE, THYROIDAB in the last 72 hours. Anemia Panel: No results for input(s): VITAMINB12, FOLATE, FERRITIN, TIBC, IRON, RETICCTPCT in the last 72 hours. Urine analysis:    Component Value Date/Time   COLORURINE YELLOW 05/02/2015 1330   APPEARANCEUR CLEAR 05/02/2015 1330   LABSPEC 1.020 05/02/2015 1330   PHURINE 7.0 05/02/2015 1330   GLUCOSEU NEGATIVE 05/02/2015 1330   HGBUR NEGATIVE 05/02/2015 1330   BILIRUBINUR NEGATIVE 05/02/2015 1330   KETONESUR NEGATIVE 05/02/2015 1330   PROTEINUR NEGATIVE 05/02/2015 1330   UROBILINOGEN 0.2 08/14/2014 1200   NITRITE NEGATIVE 05/02/2015 1330   LEUKOCYTESUR NEGATIVE 05/02/2015 1330   Sepsis Labs: @LABRCNTIP (procalcitonin:4,lacticidven:4) )No results found for this or any previous visit (from the past 240 hour(s)).   Radiological Exams on Admission: Ct Head Wo Contrast  06/21/2015  CLINICAL DATA:  Acute onset of confusion and aphasia.  Code stroke. EXAM: CT HEAD WITHOUT CONTRAST TECHNIQUE: Contiguous axial images were obtained from the base of the skull through the vertex without intravenous contrast. COMPARISON:  03/13/2015 FINDINGS: The brain shows advanced generalized atrophy. There are extensive chronic small vessel ischemic changes throughout the white matter. No identifiable acute infarction, mass lesion, hemorrhage, hydrocephalus or extra-axial collection. No calvarial abnormality. Sinuses are clear except for the left maxillary sinus which is opacified. There is atherosclerotic calcification of the major vessels at the base of the brain. IMPRESSION: No acute finding by CT. Advanced generalized brain atrophy and chronic small vessel ischemic changes. These results were called by telephone at the time of interpretation on 06/30/2015 at 6:54 pm to Dr. Gareth Morgan , who verbally acknowledged these results. Electronically Signed   By:  Nelson Chimes M.D.   On: 07/14/2015 18:57   Dg Chest Portable 1 View  06/27/2015  CLINICAL DATA:  Code stroke.  Altered mental status. EXAM: PORTABLE CHEST 1 VIEW COMPARISON:  04/15/2015 FINDINGS: The patient has taken a poor inspiration. The heart is at the upper limits of normal in size. There is calcification of the thoracic aorta. Accentuated markings at the lung bases could relate to the poor inspiration. Mild basilar pneumonia not excluded on this portable exam. The upper lungs are clear. No evidence of heart failure or effusion. IMPRESSION: Poor inspiration. Prominent markings at the bases that could relate to the poor inspiration or could be secondary to basilar pneumonia. Electronically Signed   By: Nelson Chimes M.D.   On: 06/20/2015 19:45    EKG: Independently reviewed. HR 89 sinus rhythm with right atrial enlargement QTC 442  Assessment/Plan Active Problems:  Sepsis due to aspiration pNeumonia   Encephalopathy acute  - concern for CVA could not tolerte MRI will repeat CT     History of TIA (transient ischemic attack)   Hypokalemia - replace   Aspiration pneumonia [facility]) discussed with PCCM will intubate to protect air way, rec broad antibiotic coverage with addition of zosyn   Hypertensive urgency, malignant currently hypotensive, hold home meds and fluid resuscitation for possible sepsis due to aspiration pneumonia       DVT prophylaxis: SCD Code Status: Full Code as per family Family Communication: spoke wife Nolyn Gettman [id number] Disposition Plan:  Home with wife, will need PT OT eval Consults called: by ER provider Dr. Silverio Decamp Neuro Admission status:  inpatient    Alesi Zachery MD [facility]    If 7PM-7AM, please contact night-coverage [email or web address] Password [facility]  07/06/2015, 8:36 PM

## 2015-07-07 NOTE — ED Notes (Signed)
Unable to go to MRI at this time, pt is unable to be still. Dr. Roel Cluck uinformed

## 2015-07-08 ENCOUNTER — Inpatient Hospital Stay (HOSPITAL_COMMUNITY): Payer: Medicare HMO

## 2015-07-08 ENCOUNTER — Other Ambulatory Visit (HOSPITAL_COMMUNITY): Payer: Medicare HMO

## 2015-07-08 ENCOUNTER — Encounter (HOSPITAL_COMMUNITY): Payer: Self-pay | Admitting: Radiology

## 2015-07-08 DIAGNOSIS — R410 Disorientation, unspecified: Secondary | ICD-10-CM | POA: Insufficient documentation

## 2015-07-08 DIAGNOSIS — R0602 Shortness of breath: Secondary | ICD-10-CM | POA: Insufficient documentation

## 2015-07-08 DIAGNOSIS — J690 Pneumonitis due to inhalation of food and vomit: Secondary | ICD-10-CM | POA: Insufficient documentation

## 2015-07-08 DIAGNOSIS — A419 Sepsis, unspecified organism: Secondary | ICD-10-CM

## 2015-07-08 DIAGNOSIS — J9600 Acute respiratory failure, unspecified whether with hypoxia or hypercapnia: Secondary | ICD-10-CM

## 2015-07-08 DIAGNOSIS — G9340 Encephalopathy, unspecified: Secondary | ICD-10-CM

## 2015-07-08 DIAGNOSIS — J9601 Acute respiratory failure with hypoxia: Secondary | ICD-10-CM

## 2015-07-08 LAB — PHOSPHORUS
PHOSPHORUS: 2 mg/dL — AB (ref 2.5–4.6)
PHOSPHORUS: 2.3 mg/dL — AB (ref 2.5–4.6)
Phosphorus: 3.9 mg/dL (ref 2.5–4.6)

## 2015-07-08 LAB — CBC
HCT: 43.3 % (ref 39.0–52.0)
Hemoglobin: 14.4 g/dL (ref 13.0–17.0)
MCH: 31.8 pg (ref 26.0–34.0)
MCHC: 33.3 g/dL (ref 30.0–36.0)
MCV: 95.6 fL (ref 78.0–100.0)
PLATELETS: 178 10*3/uL (ref 150–400)
RBC: 4.53 MIL/uL (ref 4.22–5.81)
RDW: 12.5 % (ref 11.5–15.5)
WBC: 9.5 10*3/uL (ref 4.0–10.5)

## 2015-07-08 LAB — I-STAT ARTERIAL BLOOD GAS, ED
ACID-BASE EXCESS: 1 mmol/L (ref 0.0–2.0)
Bicarbonate: 25.5 mEq/L — ABNORMAL HIGH (ref 20.0–24.0)
O2 Saturation: 100 %
PH ART: 7.414 (ref 7.350–7.450)
PO2 ART: 318 mmHg — AB (ref 80.0–100.0)
TCO2: 27 mmol/L (ref 0–100)
pCO2 arterial: 39.9 mmHg (ref 35.0–45.0)

## 2015-07-08 LAB — BLOOD GAS, ARTERIAL
Acid-base deficit: 1.3 mmol/L (ref 0.0–2.0)
BICARBONATE: 22.1 meq/L (ref 20.0–24.0)
DRAWN BY: 437071
FIO2: 0.5
MECHVT: 620 mL
O2 Saturation: 98.3 %
PEEP: 5 cmH2O
PH ART: 7.452 — AB (ref 7.350–7.450)
Patient temperature: 99
RATE: 14 resp/min
TCO2: 23 mmol/L (ref 0–100)
pCO2 arterial: 32.2 mmHg — ABNORMAL LOW (ref 35.0–45.0)
pO2, Arterial: 110 mmHg — ABNORMAL HIGH (ref 80.0–100.0)

## 2015-07-08 LAB — URINALYSIS, ROUTINE W REFLEX MICROSCOPIC
BILIRUBIN URINE: NEGATIVE
GLUCOSE, UA: NEGATIVE mg/dL
KETONES UR: NEGATIVE mg/dL
LEUKOCYTES UA: NEGATIVE
NITRITE: NEGATIVE
PH: 6 (ref 5.0–8.0)
PROTEIN: 100 mg/dL — AB
Specific Gravity, Urine: 1.022 (ref 1.005–1.030)

## 2015-07-08 LAB — BASIC METABOLIC PANEL
Anion gap: 12 (ref 5–15)
BUN: 23 mg/dL — AB (ref 6–20)
CALCIUM: 8.3 mg/dL — AB (ref 8.9–10.3)
CO2: 24 mmol/L (ref 22–32)
CREATININE: 1.13 mg/dL (ref 0.61–1.24)
Chloride: 101 mmol/L (ref 101–111)
Glucose, Bld: 125 mg/dL — ABNORMAL HIGH (ref 65–99)
Potassium: 4.1 mmol/L (ref 3.5–5.1)
SODIUM: 137 mmol/L (ref 135–145)

## 2015-07-08 LAB — POCT I-STAT 3, ART BLOOD GAS (G3+)
Acid-base deficit: 3 mmol/L — ABNORMAL HIGH (ref 0.0–2.0)
Bicarbonate: 22.9 mEq/L (ref 20.0–24.0)
O2 Saturation: 97 %
PCO2 ART: 42.2 mmHg (ref 35.0–45.0)
TCO2: 24 mmol/L (ref 0–100)
pH, Arterial: 7.343 — ABNORMAL LOW (ref 7.350–7.450)
pO2, Arterial: 97 mmHg (ref 80.0–100.0)

## 2015-07-08 LAB — URINE MICROSCOPIC-ADD ON

## 2015-07-08 LAB — TSH: TSH: 0.82 u[IU]/mL (ref 0.350–4.500)

## 2015-07-08 LAB — RAPID URINE DRUG SCREEN, HOSP PERFORMED
AMPHETAMINES: NOT DETECTED
BARBITURATES: NOT DETECTED
Benzodiazepines: POSITIVE — AB
Cocaine: NOT DETECTED
OPIATES: NOT DETECTED
TETRAHYDROCANNABINOL: NOT DETECTED

## 2015-07-08 LAB — CARBOXYHEMOGLOBIN
CARBOXYHEMOGLOBIN: 0.7 % (ref 0.5–1.5)
METHEMOGLOBIN: 0.7 % (ref 0.0–1.5)
O2 Saturation: 72.3 %
Total hemoglobin: 19 g/dL — ABNORMAL HIGH (ref 13.5–18.0)

## 2015-07-08 LAB — MAGNESIUM
MAGNESIUM: 1.5 mg/dL — AB (ref 1.7–2.4)
MAGNESIUM: 2.4 mg/dL (ref 1.7–2.4)
MAGNESIUM: 2.1 mg/dL (ref 1.7–2.4)

## 2015-07-08 LAB — GLUCOSE, CAPILLARY
GLUCOSE-CAPILLARY: 99 mg/dL (ref 65–99)
GLUCOSE-CAPILLARY: 121 mg/dL — AB (ref 65–99)
GLUCOSE-CAPILLARY: 157 mg/dL — AB (ref 65–99)
Glucose-Capillary: 170 mg/dL — ABNORMAL HIGH (ref 65–99)
Glucose-Capillary: 185 mg/dL — ABNORMAL HIGH (ref 65–99)

## 2015-07-08 LAB — CG4 I-STAT (LACTIC ACID): LACTIC ACID, VENOUS: 1.9 mmol/L (ref 0.5–2.0)

## 2015-07-08 LAB — MRSA PCR SCREENING: MRSA BY PCR: NEGATIVE

## 2015-07-08 LAB — LACTIC ACID, PLASMA: LACTIC ACID, VENOUS: 2 mmol/L (ref 0.5–2.0)

## 2015-07-08 LAB — TROPONIN I: Troponin I: 0.05 ng/mL — ABNORMAL HIGH (ref <0.031)

## 2015-07-08 LAB — CORTISOL

## 2015-07-08 LAB — PROCALCITONIN: Procalcitonin: 0.55 ng/mL

## 2015-07-08 MED ORDER — SUCCINYLCHOLINE CHLORIDE 20 MG/ML IJ SOLN
120.0000 mg | Freq: Once | INTRAMUSCULAR | Status: DC
  Filled 2015-07-08: qty 6

## 2015-07-08 MED ORDER — PANTOPRAZOLE SODIUM 40 MG IV SOLR: 40.0000 mg | Freq: Every day | INTRAVENOUS | Status: DC

## 2015-07-08 MED ORDER — PIPERACILLIN-TAZOBACTAM 3.375 G IVPB
3.3750 g | Freq: Three times a day (TID) | INTRAVENOUS | Status: AC
  Administered 2015-07-08 – 2015-07-15 (×22): 3.375 g via INTRAVENOUS
  Filled 2015-07-08 (×27): qty 50

## 2015-07-08 MED ORDER — ETOMIDATE 2 MG/ML IV SOLN
INTRAVENOUS | Status: AC | PRN
  Administered 2015-07-08: 20 mg via INTRAVENOUS

## 2015-07-08 MED ORDER — ANTISEPTIC ORAL RINSE SOLUTION (CORINZ)
7.0000 mL | Freq: Four times a day (QID) | OROMUCOSAL | Status: DC
  Administered 2015-07-08 – 2015-07-18 (×36): 7 mL via OROMUCOSAL

## 2015-07-08 MED ORDER — POTASSIUM CHLORIDE 20 MEQ/15ML (10%) PO SOLN
40.0000 meq | Freq: Once | ORAL | Status: AC
  Administered 2015-07-08: 40 meq
  Filled 2015-07-08: qty 30

## 2015-07-08 MED ORDER — ACETAMINOPHEN 160 MG/5ML PO SOLN
650.0000 mg | Freq: Four times a day (QID) | ORAL | Status: DC | PRN
  Administered 2015-07-08: 650 mg via ORAL
  Filled 2015-07-08: qty 20.3

## 2015-07-08 MED ORDER — FENTANYL CITRATE (PF) 100 MCG/2ML IJ SOLN
INTRAMUSCULAR | Status: AC
  Filled 2015-07-08: qty 2

## 2015-07-08 MED ORDER — FENTANYL CITRATE (PF) 100 MCG/2ML IJ SOLN
INTRAMUSCULAR | Status: AC
  Administered 2015-07-08: 50 ug via INTRAVENOUS
  Filled 2015-07-08: qty 2

## 2015-07-08 MED ORDER — HYDROCORTISONE NA SUCCINATE PF 100 MG IJ SOLR
50.0000 mg | Freq: Four times a day (QID) | INTRAMUSCULAR | Status: DC
  Administered 2015-07-08: 50 mg via INTRAVENOUS
  Filled 2015-07-08: qty 2
  Filled 2015-07-08: qty 1

## 2015-07-08 MED ORDER — VANCOMYCIN HCL IN DEXTROSE 750-5 MG/150ML-% IV SOLN
750.0000 mg | Freq: Two times a day (BID) | INTRAVENOUS | Status: DC
  Administered 2015-07-08 – 2015-07-09 (×2): 750 mg via INTRAVENOUS
  Filled 2015-07-08 (×4): qty 150

## 2015-07-08 MED ORDER — MIDAZOLAM HCL 2 MG/2ML IJ SOLN
1.0000 mg | INTRAMUSCULAR | Status: DC | PRN
  Administered 2015-07-08: 1 mg via INTRAVENOUS
  Filled 2015-07-08: qty 2

## 2015-07-08 MED ORDER — HYDRALAZINE HCL 20 MG/ML IJ SOLN
10.0000 mg | INTRAMUSCULAR | Status: DC | PRN
  Administered 2015-07-09 – 2015-07-10 (×2): 20 mg via INTRAVENOUS
  Administered 2015-07-13: 10 mg via INTRAVENOUS
  Filled 2015-07-08 (×3): qty 1

## 2015-07-08 MED ORDER — PRO-STAT SUGAR FREE PO LIQD
30.0000 mL | Freq: Two times a day (BID) | ORAL | Status: AC
  Administered 2015-07-08 (×2): 30 mL
  Filled 2015-07-08 (×2): qty 30

## 2015-07-08 MED ORDER — SUCCINYLCHOLINE CHLORIDE 20 MG/ML IJ SOLN
INTRAMUSCULAR | Status: AC | PRN
  Administered 2015-07-08: 120 mg via INTRAVENOUS

## 2015-07-08 MED ORDER — ETOMIDATE 2 MG/ML IV SOLN: 20.0000 mg | Freq: Once | INTRAVENOUS | Status: DC

## 2015-07-08 MED ORDER — HEPARIN SODIUM (PORCINE) 5000 UNIT/ML IJ SOLN
5000.0000 [IU] | Freq: Three times a day (TID) | INTRAMUSCULAR | Status: DC
  Administered 2015-07-08 – 2015-07-24 (×47): 5000 [IU] via SUBCUTANEOUS
  Filled 2015-07-08 (×49): qty 1

## 2015-07-08 MED ORDER — SODIUM CHLORIDE 0.9 % IV BOLUS (SEPSIS): 1000.0000 mL | INTRAVENOUS | Status: AC

## 2015-07-08 MED ORDER — MIDAZOLAM HCL 2 MG/2ML IJ SOLN
INTRAMUSCULAR | Status: AC
  Administered 2015-07-08: 02:00:00
  Filled 2015-07-08: qty 2

## 2015-07-08 MED ORDER — SODIUM CHLORIDE 0.9 % IV BOLUS (SEPSIS): 500.0000 mL | INTRAVENOUS | Status: AC

## 2015-07-08 MED ORDER — SODIUM CHLORIDE 0.9 % IV SOLN
250.0000 mL | INTRAVENOUS | Status: DC | PRN
  Administered 2015-07-08: 250 mL via INTRAVENOUS

## 2015-07-08 MED ORDER — LEVOTHYROXINE SODIUM 100 MCG IV SOLR
56.0000 ug | Freq: Every day | INTRAVENOUS | Status: DC
  Administered 2015-07-08 – 2015-07-10 (×3): 56 ug via INTRAVENOUS
  Filled 2015-07-08 (×3): qty 5

## 2015-07-08 MED ORDER — FENTANYL CITRATE (PF) 100 MCG/2ML IJ SOLN
INTRAMUSCULAR | Status: DC | PRN
  Administered 2015-07-08 (×2): 50 ug via INTRAVENOUS

## 2015-07-08 MED ORDER — NOREPINEPHRINE BITARTRATE 1 MG/ML IV SOLN
0.0000 ug/min | INTRAVENOUS | Status: DC
  Administered 2015-07-08: 15 ug/min via INTRAVENOUS
  Administered 2015-07-08: 12 ug/min via INTRAVENOUS
  Administered 2015-07-08: 9 ug/min via INTRAVENOUS
  Administered 2015-07-08: 20 ug/min via INTRAVENOUS
  Filled 2015-07-08 (×4): qty 4

## 2015-07-08 MED ORDER — VITAL AF 1.2 CAL PO LIQD
1000.0000 mL | ORAL | Status: DC
  Administered 2015-07-08: 1000 mL

## 2015-07-08 MED ORDER — MAGNESIUM SULFATE 2 GM/50ML IV SOLN
2.0000 g | Freq: Once | INTRAVENOUS | Status: AC
  Administered 2015-07-08: 2 g via INTRAVENOUS
  Filled 2015-07-08: qty 50

## 2015-07-08 MED ORDER — FENTANYL BOLUS VIA INFUSION
25.0000 ug | INTRAVENOUS | Status: DC | PRN
  Filled 2015-07-08: qty 25

## 2015-07-08 MED ORDER — VASOPRESSIN 20 UNIT/ML IV SOLN
0.0300 [IU]/min | INTRAVENOUS | Status: DC
  Administered 2015-07-08 – 2015-07-09 (×2): 0.03 [IU]/min via INTRAVENOUS
  Filled 2015-07-08 (×2): qty 2

## 2015-07-08 MED ORDER — FENTANYL CITRATE (PF) 100 MCG/2ML IJ SOLN: 50.0000 ug | Freq: Once | INTRAMUSCULAR | Status: AC

## 2015-07-08 MED ORDER — CHLORHEXIDINE GLUCONATE 0.12% ORAL RINSE (MEDLINE KIT)
15.0000 mL | Freq: Two times a day (BID) | OROMUCOSAL | Status: DC
  Administered 2015-07-08 – 2015-07-18 (×20): 15 mL via OROMUCOSAL

## 2015-07-08 MED ORDER — PANTOPRAZOLE SODIUM 40 MG IV SOLR
40.0000 mg | Freq: Every day | INTRAVENOUS | Status: DC
  Administered 2015-07-08: 40 mg via INTRAVENOUS
  Filled 2015-07-08: qty 40

## 2015-07-08 MED ORDER — PRAVASTATIN SODIUM 20 MG PO TABS
20.0000 mg | ORAL_TABLET | Freq: Every day | ORAL | Status: DC
  Administered 2015-07-08 – 2015-07-23 (×13): 20 mg
  Filled 2015-07-08 (×15): qty 1

## 2015-07-08 MED ORDER — SODIUM CHLORIDE 0.9 % IV BOLUS (SEPSIS)
500.0000 mL | Freq: Once | INTRAVENOUS | Status: AC
  Administered 2015-07-08: 500 mL via INTRAVENOUS

## 2015-07-08 MED ORDER — SODIUM CHLORIDE 0.9 % IV BOLUS (SEPSIS)
1000.0000 mL | Freq: Once | INTRAVENOUS | Status: AC
  Administered 2015-07-08: 1000 mL via INTRAVENOUS

## 2015-07-08 MED ORDER — DEXTROSE 5 % IV SOLN
30.0000 mmol | Freq: Once | INTRAVENOUS | Status: AC
  Administered 2015-07-08: 30 mmol via INTRAVENOUS
  Filled 2015-07-08: qty 10

## 2015-07-08 MED ORDER — ASPIRIN 325 MG PO TABS
325.0000 mg | ORAL_TABLET | Freq: Every day | ORAL | Status: DC
  Administered 2015-07-08 – 2015-07-20 (×12): 325 mg
  Filled 2015-07-08 (×13): qty 1

## 2015-07-08 MED ORDER — VANCOMYCIN HCL IN DEXTROSE 1-5 GM/200ML-% IV SOLN
1000.0000 mg | Freq: Once | INTRAVENOUS | Status: AC
  Administered 2015-07-08: 1000 mg via INTRAVENOUS
  Filled 2015-07-08 (×2): qty 200

## 2015-07-08 MED ORDER — VITAL HIGH PROTEIN PO LIQD: 1000.0000 mL | ORAL | Status: DC

## 2015-07-08 MED ORDER — LORAZEPAM 2 MG/ML IJ SOLN
INTRAMUSCULAR | Status: AC
Start: 2015-07-08 — End: 2015-07-08
  Administered 2015-07-08: 02:00:00
  Filled 2015-07-08: qty 1

## 2015-07-08 MED ORDER — SODIUM CHLORIDE 0.9 % IV SOLN
25.0000 ug/h | INTRAVENOUS | Status: DC
  Administered 2015-07-08: 100 ug/h via INTRAVENOUS
  Administered 2015-07-08: 50 ug/h via INTRAVENOUS
  Administered 2015-07-09: 25 ug/h via INTRAVENOUS
  Filled 2015-07-08 (×2): qty 50

## 2015-07-08 MED ORDER — NOREPINEPHRINE BITARTRATE 1 MG/ML IV SOLN
0.0000 ug/min | INTRAVENOUS | Status: DC
  Administered 2015-07-09: 6 ug/min via INTRAVENOUS
  Filled 2015-07-08: qty 16

## 2015-07-08 MED ORDER — SODIUM CHLORIDE 0.9 % IV SOLN
INTRAVENOUS | Status: DC
  Administered 2015-07-08 – 2015-07-09 (×3): via INTRAVENOUS

## 2015-07-08 MED ORDER — MIDAZOLAM HCL 2 MG/2ML IJ SOLN
1.0000 mg | INTRAMUSCULAR | Status: DC | PRN
  Administered 2015-07-08: 1 mg via INTRAVENOUS

## 2015-07-08 MED ORDER — PIPERACILLIN-TAZOBACTAM 3.375 G IVPB 30 MIN
3.3750 g | Freq: Once | INTRAVENOUS | Status: DC
  Filled 2015-07-08 (×2): qty 50

## 2015-07-08 MED ORDER — LORAZEPAM 2 MG/ML IJ SOLN
2.0000 mg | INTRAMUSCULAR | Status: DC | PRN
  Administered 2015-07-08: 2 mg via INTRAVENOUS
  Filled 2015-07-08: qty 1

## 2015-07-08 NOTE — Procedures (Deleted)
Extubation Procedure Note  Patient Details:   Name: Benjamin Park DOB: [birth year] MRN: [id number]   Airway Documentation: Pt had an audible cuff leak, following commands prior to extubation.  EXtubated to 4 lpm [place] tolerated well sat 95%.  Good, strong productive cough and able to state full name and where he was.  IS instruction given pt able to do 1000cc.  Will continue to monitor.    Evaluation  O2 sats: stable throughout Complications: No apparent complications Patient did tolerate procedure well. Bilateral Breath Sounds: Diminished   Yes  Ned Grace 07/08/2015, 9:44 AM

## 2015-07-08 NOTE — Progress Notes (Signed)
SLP Cancellation Note  Patient Details Name: Benjamin Park MRN: [id number] DOB: 1940/10/17   Cancelled treatment:       Reason Eval/Treat Not Completed: Patient not medically ready, remains intubated. Will follow for readiness.   [name], M.A. CCC-SLP [phone]  [name] 07/08/2015, 1:22 PM

## 2015-07-08 NOTE — Progress Notes (Signed)
Pharmacy Antibiotic Note  Benjamin Park is a 75 y.o. male admitted on 06/30/2015 with sepsis and aspiration PNA.  Pharmacy has been consulted for Vancocin and Zosyn dosing.  Plan: Vancomycin 1g x1 then 750mg  IV every 12 hours.  Goal trough 15-20 mcg/mL. Zosyn 3.375g IV q8h (4 hour infusion).  Height: 6' (182.9 cm) Weight: 151 lb 0.2 oz (68.5 kg) IBW/kg (Calculated) : 77.6  Temp (24hrs), Avg:99.3 F (37.4 C), Min:97.5 F (36.4 C), Max:101.1 F (38.4 C)   Recent Labs Lab 07/10/2015 1843 07/12/2015 1852  WBC 10.3  --   CREATININE 1.09 1.00    Estimated Creatinine Clearance: 62.8 mL/min (by C-G formula based on Cr of 1).    No Known Allergies   Thank you for allowing pharmacy to be a part of this patient's care.  Wynona Neat, PharmD, BCPS  07/08/2015 12:48 AM

## 2015-07-08 NOTE — Progress Notes (Signed)
Several attempts were made to reach MRI. Calls were placed at [place]. All the calls were not answered and was forwarded to a non functioning mailbox. At [place] xray was called and Brandy attempted to transfer the RN to MRI still with on avail. Theadora Rama took the RNs name and number and stated she would walk over to MRI and tell them to call the RN back. Still waiting to hear from MRI

## 2015-07-08 NOTE — Progress Notes (Signed)
Tasha from MRI called. MRI scheduled for 1400

## 2015-07-08 NOTE — Progress Notes (Signed)
RT Note: Pt transported to MRI & back to unit with no complications. RT will continue to monitor.

## 2015-07-08 NOTE — Progress Notes (Signed)
Initial Nutrition Assessment  DOCUMENTATION CODES:   Non-severe (moderate) malnutrition in context of chronic illness  INTERVENTION:    Initiate TF via OGT with Vital AF 1.2 at 25 ml/h and Prostat 30 ml BID on day 1; on day 2, d/c Prostat and increase to goal rate of 65 ml/h (1560 ml per day) to provide 1872 kcals, 117 gm protein, 1265 ml free water daily.  NUTRITION DIAGNOSIS:   Malnutrition related to chronic illness as evidenced by mild depletion of body fat, moderate depletion of body fat, moderate depletions of muscle mass, severe depletion of muscle mass, mild depletion of muscle mass.  GOAL:   Patient will meet greater than or equal to 90% of their needs  MONITOR:   TF tolerance, Vent status, Weight trends, Labs, I & O's  REASON FOR ASSESSMENT:   Consult Enteral/tube feeding initiation and management  ASSESSMENT:   75 y.o. male with PMH as outlined below including uncontrolled hypertension for which he has had multiple admissions. He was brought to [facility] ED 04/19 evening by his wife with AMS, garbled speech, vomiting. Found to have new LLL infiltrates, likely due to aspiration PNA.   Patient required intubation in the ED.  Labs reviewed: magnesium and phosphorus are low Nutrition-Focused physical exam completed. Findings are mild-moderate fat depletion, mild-moderate and severe muscle depletion, and no edema.  Patient with moderate PCM. Patient is currently intubated on ventilator support MV: 11.5 L/min Temp (24hrs), Avg:100.2 F (37.9 C), Min:97.5 F (36.4 C), Max:101.8 F (38.8 C)   Diet Order:  Diet NPO time specified  Skin:  Reviewed, no issues  Last BM:  unknown  Height:   Ht Readings from Last 1 Encounters:  07/08/15 6' (1.829 m)    Weight:   Wt Readings from Last 1 Encounters:  07/08/15 151 lb 10.8 oz (68.8 kg)    Ideal Body Weight:  80.9 kg  BMI:  Body mass index is 20.57 kg/(m^2).  Estimated Nutritional Needs:   Kcal:  1900  Protein:   100-110 gm  Fluid:  2 L  EDUCATION NEEDS:   No education needs identified at this time  Molli Barrows, [place], [place], [place] Pager [phone] After Hours Pager [phone]

## 2015-07-08 NOTE — Progress Notes (Addendum)
PULMONARY / CRITICAL CARE MEDICINE   Name: Benjamin Park MRN: [id number] DOB: 1940/11/02    ADMISSION DATE:  07/04/2015 CONSULTATION DATE:  07/08/15  REFERRING MD:  EDP  CHIEF COMPLAINT:  AMS  HISTORY OF PRESENT ILLNESS:  Pt is encephelopathic; therefore, this HPI is obtained from chart review. Benjamin Park is a 75 y.o. male with PMH as outlined below including uncontrolled hypertension for which he has had multiple admissions.  He was brought to [facility] ED 04/19 evening by his wife who informed staff that he was having a stroke due to the fact that he was unable to follow commands and had gargled speech.  He has hx of hemorrhagic stroke and apparently presented very similarly at the time.  He was last seen normal around 1pm earlier that afternoon.  Wife stated that he never had any focal deficits, only gargled speech and inability follow commands.  In ED, he had elevated BP's into the low 200's.  He was started on nicardipine and after only 3 - 5 minutes, BP dropped into low 100's per ED RN.  He was evaluated by neurology and initial head CT was negative for acute infarct or hemorrhage.  Per wife, pt's BP was up to 165 earlier in the day and she tried to give him his oral meds; however, he was unable to swallow them due to vomiting. He was initially being admitted by [facility]; however, while in ED, he had sudden change in mental status to the point where he became minimally responsive.  He also had BP down to 98 systolic along with hypoxia.  ABG revealed pO2 of 50 and repeat CXR showed new LLL infiltrate, likely due to aspiration PNA.  Due to his rapid decline, he was intubated for airway protection and PCCM was called to admit him to the ICU for further evaluation and management.  He has brain MRI pending.  SUBJECTIVE: Intubated, sedated. Does not follow commands. Levophed on board  VITAL SIGNS: BP 103/79 mmHg  Pulse 86  Temp(Src) 100.4 F (38 C) (Rectal)  Resp 14  Ht 6' (1.829 m)  Wt 151  lb 10.8 oz (68.8 kg)  BMI 20.57 kg/m2  SpO2 98%  HEMODYNAMICS:    VENTILATOR SETTINGS: Vent Mode:  [-] PRVC FiO2 (%):  [50 %-100 %] 50 % Set Rate:  [14 bmp] 14 bmp Vt Set:  HJ:[id number] mL] 620 mL PEEP:  [5 cmH20] 5 cmH20 Plateau Pressure:  [18 cmH20-20 cmH20] 20 cmH20  INTAKE / OUTPUT: I/O last 3 completed shifts: In: 910.2 [I.V.:910.2] Out: 51 [Urine:410]   PHYSICAL EXAMINATION: General: Elderly male, chronically ill appearing, in NAD. Neuro: Sedated, does not follow commands. HEENT: [place]/AT. PERRL, sclerae anicteric. Cardiovascular: RRR, no M/R/G.  Lungs: Respirations even and unlabored.  CTA bilaterally, No W/R/R. Abdomen: BS x 4, soft, NT/ND.  Musculoskeletal: No gross deformities, no edema.  Skin: Intact, warm, no rashes.  LABS:  BMET  Recent Labs Lab 07/10/2015 1843 06/26/2015 1852 07/08/15 0105  NA 139 141 137  K 3.4* 3.3* 4.1  CL 102 100* 101  CO2 25  --  24  BUN 23* 28* 23*  CREATININE 1.09 1.00 1.13  GLUCOSE 159* 155* 125*    Electrolytes  Recent Labs Lab 07/18/2015 1843 07/08/15 0105  CALCIUM 9.4 8.3*  MG  --  1.5*  PHOS  --  2.0*    CBC  Recent Labs Lab 06/30/2015 1843 06/25/2015 1852 07/08/15 0105  WBC 10.3  --  9.5  HGB 15.8 17.7*  14.4  HCT 46.5 52.0 43.3  PLT 190  --  178    Coag's  Recent Labs Lab 07/02/2015 1843  APTT 32  INR 1.15    Sepsis Markers  Recent Labs Lab 07/08/15 0105 07/08/15 0120  LATICACIDVEN 2.0 1.90  PROCALCITON 0.55  --     ABG  Recent Labs Lab 07/06/2015 2334 07/08/15 0203 07/08/15 0410  PHART 7.375 7.414 7.452*  PCO2ART 39.8 39.9 32.2*  PO2ART 50.0* 318.0* 110*    Liver Enzymes  Recent Labs Lab 07/12/2015 1843  AST 21  ALT 20  ALKPHOS 77  BILITOT 0.8  ALBUMIN 3.9    Cardiac Enzymes  Recent Labs Lab 07/08/15 0105  TROPONINI 0.05*    Glucose  Recent Labs Lab 07/08/2015 2256 07/08/15 0247  GLUCAP 138* 99    Imaging Ct Head Wo Contrast  07/08/2015  CLINICAL DATA:  Change in  mental status. EXAM: CT HEAD WITHOUT CONTRAST TECHNIQUE: Contiguous axial images were obtained from the base of the skull through the vertex without intravenous contrast. COMPARISON:  CT 5-1/2 hours prior.  Brain MRI 03/13/2015 FINDINGS: No change from prior exam. Advanced cerebral and cerebellar atrophy again seen. Advanced chronic small vessel ischemia. No evidence of developing infarct. No interval hemorrhage. Unchanged opacification of the left maxillary sinus, ethmoid air cell and frontal sinus to lesser extent. IMPRESSION: Advanced atrophy and chronic small vessel ischemia. No change from exam 5 hours prior. These results will be called to the ordering clinician or representative by the Radiologist Assistant, and communication documented in the PACS or zVision Dashboard. Electronically Signed   By: Jeb Levering M.D.   On: 07/08/2015 00:52   Ct Head Wo Contrast  06/26/2015  CLINICAL DATA:  Acute onset of confusion and aphasia.  Code stroke. EXAM: CT HEAD WITHOUT CONTRAST TECHNIQUE: Contiguous axial images were obtained from the base of the skull through the vertex without intravenous contrast. COMPARISON:  03/13/2015 FINDINGS: The brain shows advanced generalized atrophy. There are extensive chronic small vessel ischemic changes throughout the white matter. No identifiable acute infarction, mass lesion, hemorrhage, hydrocephalus or extra-axial collection. No calvarial abnormality. Sinuses are clear except for the left maxillary sinus which is opacified. There is atherosclerotic calcification of the major vessels at the base of the brain. IMPRESSION: No acute finding by CT. Advanced generalized brain atrophy and chronic small vessel ischemic changes. These results were called by telephone at the time of interpretation on 07/08/2015 at 6:54 pm to Dr. Gareth Morgan , who verbally acknowledged these results. Electronically Signed   By: Nelson Chimes M.D.   On: 07/14/2015 18:57   Dg Chest Portable 1  View  07/08/2015  CLINICAL DATA:  Post intubation EXAM: PORTABLE CHEST 1 VIEW COMPARISON:  06/29/2015 FINDINGS: Interval placement of an endotracheal tube with tip measuring 3.6 cm above the carina. Enteric tube tip is not visualized but is below the left hemidiaphragm, likely in the distal stomach. Borderline heart size with normal pulmonary vascularity. Focal airspace consolidation in the left mid lung as before. Developing consolidation in the right upper lung. No blunting of costophrenic angles. No pneumothorax. Calcification of the aorta. IMPRESSION: Appliances appear in satisfactory position. Focal consolidation in the left mid lung with developing consolidation in the right upper lung. Electronically Signed   By: Lucienne Capers M.D.   On: 07/08/2015 01:12   Dg Chest Port 1 View  06/30/2015  CLINICAL DATA:  Pneumonia.  Shortness of breath. EXAM: PORTABLE CHEST 1 VIEW COMPARISON:  Earlier same day.  04/15/2015. FINDINGS: Compared to the study of only 4 hours ago, there is rapidly worsening infiltrate in the left lower lobe. This raises the possibility of aspiration. Right chest appears clear in this projection. Presumed Watchman device projects over the heart. IMPRESSION: Development of rapid airspace filling in the left lower lobe consistent with pneumonia. Rapid development suggests possible aspiration. Electronically Signed   By: Nelson Chimes M.D.   On: 06/25/2015 23:49   Dg Chest Portable 1 View  07/13/2015  CLINICAL DATA:  Code stroke.  Altered mental status. EXAM: PORTABLE CHEST 1 VIEW COMPARISON:  04/15/2015 FINDINGS: The patient has taken a poor inspiration. The heart is at the upper limits of normal in size. There is calcification of the thoracic aorta. Accentuated markings at the lung bases could relate to the poor inspiration. Mild basilar pneumonia not excluded on this portable exam. The upper lungs are clear. No evidence of heart failure or effusion. IMPRESSION: Poor inspiration. Prominent  markings at the bases that could relate to the poor inspiration or could be secondary to basilar pneumonia. Electronically Signed   By: Nelson Chimes M.D.   On: 06/28/2015 19:45     STUDIES:  CT head 04/19 > no acute finding.  Chronic small vessel ischemic changes. CXR 04/20 > focal consolidation LLL and developing consolidation RUL. Brain MRI 04/20 >   CULTURES: Blood 04/20 > Sputum 04/20 >   ANTIBIOTICS: Zosyn 04/20 >>> Vanc 4/20>>>  SIGNIFICANT EVENTS: 04/20 > admitted with hypertensive urgency, AMS, ? PRES, ? CVA, aspiration PNA, acute hypoxic respiratory failure.  LINES/TUBES: ETT 04/20 >  DISCUSSION: 75 y.o. M with hx uncontrolled HTN and previous hemorrhagic stroke, admitted 04/20 with hypertensive crisis.  While in ED, had worsening mental status along with hypoxic respiratory failure, likely due to aspiration PNA.  He required intubation and PCCM was called to admit to ICU.  Brain MRI pending to rule out CVA and further evaluate for PRES.  ASSESSMENT / PLAN:  NEUROLOGIC A:   Acute encephalopathy - unclear etiology at this point but likely exacerbated by hypertension.  Initial head CT negative for acute infarct or hemorrhage; however, history and exam still concerning for underlying ischemic stroke. Consider PRES given hx of severe uncontrolled HTN. Hx TIA, CVA, SAH, hypertensive encephalopathy, headache. P:   Sedation:  Fentanyl gtt / Midazolam PRN. RASS goal: 0 to -1. Daily WUA. STAT MRI brain. Neurology following, appreciate the assistance. MAP goals, see cvs  CARDIOVASCULAR A:  Hypertensive urgency/emergency - resolved after cardene gtt started; cardene now off. Septic shock Hx HTN, HLD, MI. Autoregulation concerns now in shock P:  Hold further cardene. Lactate wnl. Continue outpatient ASA, lovastatin. MAP goal is 25% -30% reduction from highest MAP to allow autoregulation, goal MAP 85-110 Place line Get cortisol Levophed to these goals May need vaso  addition cvp needed Add ss protocol  PULMONARY A: Acute hypoxic respiratory failure - likely due to aspiration. Probable aspiration PNA. Hx OSA - on CPAP. P:   Full vent support. abg reviewed, to 7 cc/kg VAP prevention measures. Abx / cultures per ID section. CXR pending  RENAL A:   Hypokalemia - resolved HypoMg HypoPhos P:   NS @ 100. BMP in AM. Replace Mg, Phos Get cvp may need bolus, likely  GASTROINTESTINAL A:   GI prophylaxis. Nutrition. Hx dysphagia - has apparently failed swallow evals in the past and refuses to use thickening solution. P:   SUP: Pantoprazole. NPO. Hold TF until MRI SLP consult once extubated.  HEMATOLOGIC  A:   VTE Prophylaxis. P:  SCD's / heparin. CBC in AM.  INFECTIOUS A:   Probable aspiration PNA. Septic shock P:   Started on Vanc/Zosyn UA pending Follow cultures PCT elevated, 0.55, repeat  On Levo for hypotension, needs CVL  ENDOCRINE A:   Hypothyroidism. P:   Continue outpatient synthroid, change to IV formulation. TSH wnl   Family updated: None.  Interdisciplinary Family Meeting v Palliative Care Meeting:  Due by: 07/14/15.  Maryellen Pile, MD IMTS PGY-1 [phone]  STAFF NOTE: I, Merrie Roof, MD FACP have personally reviewed patient's available data, including medical history, events of note, physical examination and test results as part of my evaluation. I have discussed with resident/NP and other care providers such as pharmacist, RN and RRT. In addition, I personally evaluated patient and elicited key findings of: seen now in am rounds, lungs clear, responds to pain, MAP now in shock on levophed, need to ensure autoregulation, drop MAP 30% from highest, therefore now in shock, have goals MAP 85 low- high 110, will need to add vaso , levo increase I will place line, cvp, septic shock protocol, ensure fluids given to 30 cc/kg, see below for repeat assessment I did, get cortisol, then stress roids, for MIR  then Tf start, place a line also with this degree of shock, added vanc to zosyn for aspiration, cultures sent The patient is critically ill with multiple organ systems failure and requires high complexity decision making for assessment and support, frequent evaluation and titration of therapies, application of advanced monitoring technologies and extensive interpretation of multiple databases.   Critical Care Time devoted to patient care services described in this note is 50 Minutes. This time reflects time of care of this signee: Merrie Roof, MD FACP. This critical care time does not reflect procedure time, or teaching time or supervisory time of PA/NP/Med student/Med Resident etc but could involve care discussion time. Rest per NP/medical resident whose note is outlined above and that I agree with   [name]. Titus Mould, MD, Olga Pgr: [facility] 07/08/2015 9:18 AM  Sepsis - Repeat Assessment  Performed at:    9 am 4/20  Vitals     Blood pressure 83/65, pulse 80, temperature 101.3 F (38.5 C), temperature source Rectal, resp. rate 14, height 6' (1.829 m), weight 68.8 kg (151 lb 10.8 oz), SpO2 99 %.  Heart:     Regular rate and rhythm  Lungs:    CTA  Capillary Refill:   <2 sec  Peripheral Pulse:   Radial pulse palpable  Skin:     Normal Color

## 2015-07-08 NOTE — Progress Notes (Signed)
[facility] Progress Note Patient Name: Benjamin Park DOB: May 17, 1940 MRN: [id number]   Date of Service  07/08/2015  HPI/Events of Note  75 yo male with HTN crisis encephalopathy,hypoxic from aspiration po2 50, now in shock,sepsis  eICU Interventions  1.advised ER to intubate ASAP 2.admit to pccm 3.vent and vasopressor support 4.empiric abx     Intervention Category Major Interventions: Hypoxemia - evaluation and management Evaluation Type: New Patient Evaluation  Benjamin Park 07/08/2015, 12:18 AM

## 2015-07-08 NOTE — H&P (Signed)
PULMONARY / CRITICAL CARE MEDICINE   Name: Benjamin Park MRN: [id number] DOB: 1940-11-20    ADMISSION DATE:  07/08/2015 CONSULTATION DATE:  07/08/15  REFERRING MD:  EDP  CHIEF COMPLAINT:  AMS  HISTORY OF PRESENT ILLNESS:  Pt is encephelopathic; therefore, this HPI is obtained from chart review. Benjamin Park is a 75 y.o. male with PMH as outlined below including uncontrolled hypertension for which he has had multiple admissions.  He was brought to [facility] ED 04/19 evening by his wife who informed staff that he was having a stroke due to the fact that he was unable to follow commands and had gargled speech.  He has hx of hemorrhagic stroke and apparently presented very similarly at the time.  He was last seen normal around 1pm earlier that afternoon.  Wife stated that he never had any focal deficits, only gargled speech and inability follow commands.  In ED, he had elevated BP's into the low 200's.  He was started on nicardipine and after only 3 - 5 minutes, BP dropped into low 100's per ED RN.  He was evaluated by neurology and initial head CT was negative for acute infarct or hemorrhage.  Per wife, pt's BP was up to 165 earlier in the day and she tried to give him his oral meds; however, he was unable to swallow them due to vomiting. He was initially being admitted by [facility]; however, while in ED, he had sudden change in mental status to the point where he became minimally responsive.  He also had BP down to 98 systolic along with hypoxia.  ABG revealed pO2 of 50 and repeat CXR showed new LLL infiltrate, likely due to aspiration PNA.  Due to his rapid decline, he was intubated for airway protection and PCCM was called to admit him to the ICU for further evaluation and management.  He has brain MRI pending.    PAST MEDICAL HISTORY :  He  has a past medical history of transient ischemic attack (TIA); Hyperlipidemia; ED (erectile dysfunction); Personal history of kidney stones; Subarachnoid  hemorrhage ([place]); Myocardial infarction ([place]); Dysphagia; Hypokalemia; Hypothyroidism; Sleep apnea; History of kidney stones (02-27-13); Throat cancer ([place]); Stroke [facility]); Unspecified cerebral artery occlusion with cerebral infarction (11/08/2012); Episodic confusion (02-27-13); AKI (acute kidney injury) ([place]) (02/23/2011); Hypertension; Hypertensive encephalopathy; Headache(784.0); Neuromuscular disorder ([place]); and Coronary artery disease.  PAST SURGICAL HISTORY: He  has past surgical history that includes Angioplasty (1993); Cardiac catheterization; Coronary angioplasty; basal cell cancer; Hernia repair; Appendectomy; Patent foramen ovale closure (2008); Eye surgery; Colonoscopy (N/A, 06/18/2012); Hot hemostasis (N/A, 06/18/2012); and Colonoscopy with propofol (N/A, 03/07/2013).  No Known Allergies  No current facility-administered medications on file prior to encounter.   Current Outpatient Prescriptions on File Prior to Encounter  Medication Sig  . Ascorbic Acid (VITAMIN C PO) Take 1 tablet by mouth daily.  Marland Kitchen aspirin 325 MG tablet Take 325 mg by mouth daily.  . chlorthalidone (HYGROTON) 25 MG tablet Take 25 mg by mouth daily.  Marland Kitchen ENSURE (ENSURE) Take 237 mLs by mouth daily.  . fish oil-omega-3 fatty acids 1000 MG capsule Take 1 g by mouth daily.   Marland Kitchen GLUCOSAMINE HCL PO Take 1 tablet by mouth daily. Reported on 04/15/2015  . KLOR-CON M10 10 MEQ tablet Take 10 mEq by mouth daily.  Marland Kitchen labetalol (NORMODYNE) 100 MG tablet Take 1 tablet (100 mg total) by mouth 2 (two) times daily.  Marland Kitchen levothyroxine (SYNTHROID, LEVOTHROID) 112 MCG tablet Take 112 mcg by mouth daily.   Marland Kitchen  lovastatin (MEVACOR) 20 MG tablet Take 20 mg by mouth 2 (two) times daily.  . Maltodextrin-Xanthan Gum (RESOURCE THICKENUP CLEAR) POWD Take 1 g by mouth as needed.  . Multiple Vitamin (MULITIVITAMIN WITH MINERALS) TABS Take 1 tablet by mouth daily.      FAMILY HISTORY:  His indicated that his mother is deceased. He indicated that his  father is deceased. He indicated that his sister is deceased. He indicated that his brother is deceased.   SOCIAL HISTORY: He  reports that he quit smoking about 24 years ago. His smoking use included Cigarettes. He has never used smokeless tobacco. He reports that he does not drink alcohol or use illicit drugs.  REVIEW OF SYSTEMS:  Unable to obtain as pt is encephalopathic.  SUBJECTIVE: Intubated, does not follow commands.  VITAL SIGNS: BP 129/98 mmHg  Pulse 108  Temp(Src) 101.1 F (38.4 C) (Rectal)  Resp 20  Ht 6' (1.829 m)  Wt 68.5 kg (151 lb 0.2 oz)  BMI 20.48 kg/m2  SpO2 98%  HEMODYNAMICS:    VENTILATOR SETTINGS:    INTAKE / OUTPUT:     PHYSICAL EXAMINATION: General: Elderly male, chronically ill appearing, in NAD. Neuro: Sedated, does not follow commands. HEENT: [place]/AT. PERRL, sclerae anicteric. Cardiovascular: RRR, no M/R/G.  Lungs: Respirations even and unlabored.  CTA bilaterally, No W/R/R. Abdomen: BS x 4, soft, NT/ND.  Musculoskeletal: No gross deformities, no edema.  Skin: Intact, warm, no rashes.  LABS:  BMET  Recent Labs Lab 07/03/2015 1843 07/16/2015 1852  NA 139 141  K 3.4* 3.3*  CL 102 100*  CO2 25  --   BUN 23* 28*  CREATININE 1.09 1.00  GLUCOSE 159* 155*    Electrolytes  Recent Labs Lab 07/06/2015 1843  CALCIUM 9.4    CBC  Recent Labs Lab 07/06/2015 1843 07/14/2015 1852  WBC 10.3  --   HGB 15.8 17.7*  HCT 46.5 52.0  PLT 190  --     Coag's  Recent Labs Lab 07/09/2015 1843  APTT 32  INR 1.15    Sepsis Markers No results for input(s): LATICACIDVEN, PROCALCITON, O2SATVEN in the last 168 hours.  ABG  Recent Labs Lab 07/03/2015 2334  PHART 7.375  PCO2ART 39.8  PO2ART 50.0*    Liver Enzymes  Recent Labs Lab 06/24/2015 1843  AST 21  ALT 20  ALKPHOS 77  BILITOT 0.8  ALBUMIN 3.9    Cardiac Enzymes No results for input(s): TROPONINI, PROBNP in the last 168 hours.  Glucose  Recent Labs Lab 06/22/2015 2256   GLUCAP 138*    Imaging Ct Head Wo Contrast  07/08/2015  CLINICAL DATA:  Change in mental status. EXAM: CT HEAD WITHOUT CONTRAST TECHNIQUE: Contiguous axial images were obtained from the base of the skull through the vertex without intravenous contrast. COMPARISON:  CT 5-1/2 hours prior.  Brain MRI 03/13/2015 FINDINGS: No change from prior exam. Advanced cerebral and cerebellar atrophy again seen. Advanced chronic small vessel ischemia. No evidence of developing infarct. No interval hemorrhage. Unchanged opacification of the left maxillary sinus, ethmoid air cell and frontal sinus to lesser extent. IMPRESSION: Advanced atrophy and chronic small vessel ischemia. No change from exam 5 hours prior. These results will be called to the ordering clinician or representative by the Radiologist Assistant, and communication documented in the PACS or zVision Dashboard. Electronically Signed   By: Jeb Levering M.D.   On: 07/08/2015 00:52   Ct Head Wo Contrast  06/27/2015  CLINICAL DATA:  Acute onset of  confusion and aphasia.  Code stroke. EXAM: CT HEAD WITHOUT CONTRAST TECHNIQUE: Contiguous axial images were obtained from the base of the skull through the vertex without intravenous contrast. COMPARISON:  03/13/2015 FINDINGS: The brain shows advanced generalized atrophy. There are extensive chronic small vessel ischemic changes throughout the white matter. No identifiable acute infarction, mass lesion, hemorrhage, hydrocephalus or extra-axial collection. No calvarial abnormality. Sinuses are clear except for the left maxillary sinus which is opacified. There is atherosclerotic calcification of the major vessels at the base of the brain. IMPRESSION: No acute finding by CT. Advanced generalized brain atrophy and chronic small vessel ischemic changes. These results were called by telephone at the time of interpretation on 06/26/2015 at 6:54 pm to Dr. Gareth Morgan , who verbally acknowledged these results.  Electronically Signed   By: Nelson Chimes M.D.   On: 06/25/2015 18:57   Dg Chest Port 1 View  06/23/2015  CLINICAL DATA:  Pneumonia.  Shortness of breath. EXAM: PORTABLE CHEST 1 VIEW COMPARISON:  Earlier same day.  04/15/2015. FINDINGS: Compared to the study of only 4 hours ago, there is rapidly worsening infiltrate in the left lower lobe. This raises the possibility of aspiration. Right chest appears clear in this projection. Presumed Watchman device projects over the heart. IMPRESSION: Development of rapid airspace filling in the left lower lobe consistent with pneumonia. Rapid development suggests possible aspiration. Electronically Signed   By: Nelson Chimes M.D.   On: 07/16/2015 23:49   Dg Chest Portable 1 View  07/03/2015  CLINICAL DATA:  Code stroke.  Altered mental status. EXAM: PORTABLE CHEST 1 VIEW COMPARISON:  04/15/2015 FINDINGS: The patient has taken a poor inspiration. The heart is at the upper limits of normal in size. There is calcification of the thoracic aorta. Accentuated markings at the lung bases could relate to the poor inspiration. Mild basilar pneumonia not excluded on this portable exam. The upper lungs are clear. No evidence of heart failure or effusion. IMPRESSION: Poor inspiration. Prominent markings at the bases that could relate to the poor inspiration or could be secondary to basilar pneumonia. Electronically Signed   By: Nelson Chimes M.D.   On: 07/09/2015 19:45     STUDIES:  CT head 04/19 > no acute finding.  Chronic small vessel ischemic changes. CXR 04/20 > focal consolidation LLL and developing consolidation RUL. Brain MRI 04/20 >   CULTURES: Blood 04/20 > Sputum 04/20 >  ANTIBIOTICS: Zosyn 04/20 >  SIGNIFICANT EVENTS: 04/20 > admitted with hypertensive urgency, AMS, ? PRES, ? CVA, aspiration PNA, acute hypoxic respiratory failure.  LINES/TUBES: ETT 04/20 >  DISCUSSION: 75 y.o. M with hx uncontrolled HTN and previous hemorrhagic stroke, admitted 04/20  with hypertensive crisis.  While in ED, had worsening mental status along with hypoxic respiratory failure, likely due to aspiration PNA.  He required intubation and PCCM was called to admit to ICU.  Brain MRI pending to rule out CVA and further evaluate for PRES.  ASSESSMENT / PLAN:  NEUROLOGIC A:   Acute encephalopathy - unclear etiology at this point but likely exacerbated by hypertension.  Initial head CT negative for acute infarct or hemorrhage; however, history and exam still concerning for underlying ischemic stroke. Consider PRES given hx of severe uncontrolled HTN. Hx TIA, CVA, SAH, hypertensive encephalopathy, headache. P:   Sedation:  Fentanyl gtt / Midazolam PRN. RASS goal: 0 to -1. Daily WUA. STAT MRI brain. Neurology following, appreciate the assistance.  CARDIOVASCULAR A:  Hypertensive urgency - resolved after cardene gtt  started; cardene now off. Hx HTN, HLD, MI. P:  Hold further cardene. Hydralazine PRN. Assess troponin, lactate. Continue outpatient ASA, lovastatin. Hold outpatient chlorthalidone, clonidine, labetalol.  PULMONARY A: Acute hypoxic respiratory failure - likely due to aspiration. Probable aspiration PNA. Hx OSA - on CPAP. P:   Full vent support. Wean as able. VAP prevention measures. SBT in AM if able. Abx / cultures per ID section. CXR in AM.  RENAL A:   Hypokalemia. P:   NS @ 100. Potassium 40 MEq per tube x 1. BMP in AM.  GASTROINTESTINAL A:   GI prophylaxis. Nutrition. Hx dysphagia - has apparently failed swallow evals in the past and refuses to use thickening solution. P:   SUP: Pantoprazole. NPO. SLP consult once extubated.  HEMATOLOGIC A:   VTE Prophylaxis. P:  SCD's / heparin. CBC in AM.  INFECTIOUS A:   Probable aspiration PNA. P:   Abx as above (zosyn).  Follow cultures as above. PCT algorithm to limit abx exposure.  ENDOCRINE A:   Hypothyroidism. P:   Continue outpatient synthroid, change to IV  formulation. Assess TSH.   Family updated: None.  Interdisciplinary Family Meeting v Palliative Care Meeting:  Due by: 07/14/15.  CC time:  40 minutes.  Montey Hora, [place] Pulmonary & Critical Care Medicine Pager: [phone]  or [phone] 07/08/2015, 12:59 AM  Attending Note:  75 year old male with extensive PMH as above presenting with AMS and aspiration.  On exam, patient withdraws all ext to pain but not commands.  On fentanyl along at this time.  Aspirated and was intubated for airway protection and now in septic shock.  On levophed.  Neuro has not been called.  I reviewed head CT myself, atrophy but no acute disease.  MRI pending.  Will have day shift place a central line and call neurology for evaluation after MRI.  I ordered EEG.  Continue vanc/zosyn and f/u on cultures.  The patient is critically ill with multiple organ systems failure and requires high complexity decision making for assessment and support, frequent evaluation and titration of therapies, application of advanced monitoring technologies and extensive interpretation of multiple databases.   Critical Care Time devoted to patient care services described in this note is  35  Minutes. This time reflects time of care of this signee Dr Jennet Maduro. This critical care time does not reflect procedure time, or teaching time or supervisory time of PA/NP/Med student/Med Resident etc but could involve care discussion time.  Rush Farmer, M.D. [facility] Pulmonary/Critical Care Medicine. Pager: [phone]. After hours pager: [phone].

## 2015-07-08 NOTE — Procedures (Signed)
Arterial Catheter Insertion Procedure Note Benjamin Park [id number] [birth year]  Procedure: Insertion of Arterial Catheter  Indications: Blood pressure monitoring and Frequent blood sampling  Procedure Details Consent: Risks of procedure as well as the alternatives and risks of each were explained to the (patient/caregiver).  Consent for procedure obtained. Time Out: Verified patient identification, verified procedure, site/side was marked, verified correct patient position, special equipment/implants available, medications/allergies/relevent history reviewed, required imaging and test results available.  Performed  Maximum sterile technique was used including antiseptics, cap, gloves, gown, hand hygiene, mask and sheet. Skin prep: Chlorhexidine; local anesthetic administered 20 gauge catheter was inserted into right radial artery using the Seldinger technique.  Evaluation Blood flow good; BP tracing good. Complications: No apparent complications.   Benjamin Park 07/08/2015

## 2015-07-08 NOTE — Procedures (Deleted)
PROCEDURE:  Internal jugular central venous catheter, U/S guided.  INDICATION: Need for vasoactive medications  PROCEDURE OPERATOR: Maryellen Pile, MD and Merrie Roof  CONSENT: Risks of procedure as well as the alternatives and risks of each were explained to the (patient/caregiver). Consent for procedure obtained.  PROCEDURE SUMMARY:  A time-out was performed. The patient's RIGHT neck region was prepped and draped in sterile fashion using chlorhexidine scrub. Anesthesia was achieved with 1% lidocaine. The RIGHT internal jugular vein was accessed under ultrasound guidance using a finder needle and sheath. Venous blood was withdrawn and the sheath was advanced into the vein and the needle was withdrawn. A guidewire was advanced through the sheath. A small incision was made with a 10 blade scalpel and the sheath was exchanged for a dilator over the guidewire until appropriate dilation was obtained. The dilator was removed and an 8.5 [place] central venous quad-lumen catheter was advanced over the guidewire and secured into place with 4 sutures. At time of procedure completion, all ports aspirated and flushed properly. Post-procedure x-ray shows the tip of the catheter within the superior vena cava.  COMPLICATIONS: None

## 2015-07-08 NOTE — Procedures (Signed)
Central Venous Catheter Insertion Procedure Note NATANEL ANGOTTI [id number] 1940/08/08  Procedure: Insertion of Central Venous Catheter Indications: Assessment of intravascular volume, Drug and/or fluid administration and Frequent blood sampling  Procedure Details Consent: Risks of procedure as well as the alternatives and risks of each were explained to the (patient/caregiver).  Consent for procedure obtained. Time Out: Verified patient identification, verified procedure, site/side was marked, verified correct patient position, special equipment/implants available, medications/allergies/relevent history reviewed, required imaging and test results available.  Performed  Maximum sterile technique was used including antiseptics, cap, gloves, gown, hand hygiene, mask and sheet. Skin prep: Chlorhexidine; local anesthetic administered A antimicrobial bonded/coated triple lumen catheter was placed in the right internal jugular vein using the Seldinger technique.  Evaluation Blood flow good Complications: No apparent complications Patient did tolerate procedure well. Chest X-ray ordered to verify placement.  CXR: pending.  Raylene Miyamoto 07/08/2015, 10:14 AM  [place] [name]. Titus Mould, MD, [place] Pgr: [place] [facility]monary & Critical Care

## 2015-07-09 ENCOUNTER — Inpatient Hospital Stay (HOSPITAL_COMMUNITY): Payer: Medicare HMO

## 2015-07-09 DIAGNOSIS — R401 Stupor: Secondary | ICD-10-CM

## 2015-07-09 DIAGNOSIS — J690 Pneumonitis due to inhalation of food and vomit: Secondary | ICD-10-CM

## 2015-07-09 DIAGNOSIS — G9340 Encephalopathy, unspecified: Secondary | ICD-10-CM

## 2015-07-09 DIAGNOSIS — R410 Disorientation, unspecified: Secondary | ICD-10-CM

## 2015-07-09 LAB — POCT I-STAT 3, ART BLOOD GAS (G3+)
ACID-BASE DEFICIT: 3 mmol/L — AB (ref 0.0–2.0)
Acid-Base Excess: 1 mmol/L (ref 0.0–2.0)
BICARBONATE: 26 meq/L — AB (ref 20.0–24.0)
BICARBONATE: 19.8 meq/L — AB (ref 20.0–24.0)
O2 SAT: 96 %
O2 Saturation: 99 %
PCO2 ART: 44 mmHg (ref 35.0–45.0)
PCO2 ART: 24.9 mmHg — AB (ref 35.0–45.0)
PH ART: 7.379 (ref 7.350–7.450)
PO2 ART: 161 mmHg — AB (ref 80.0–100.0)
PO2 ART: 70 mmHg — AB (ref 80.0–100.0)
Patient temperature: 99
Patient temperature: 98
TCO2: 27 mmol/L (ref 0–100)
TCO2: 21 mmol/L (ref 0–100)
pH, Arterial: 7.507 — ABNORMAL HIGH (ref 7.350–7.450)

## 2015-07-09 LAB — BASIC METABOLIC PANEL
ANION GAP: 10 (ref 5–15)
ANION GAP: 8 (ref 5–15)
BUN: 21 mg/dL — ABNORMAL HIGH (ref 6–20)
BUN: 13 mg/dL (ref 6–20)
CALCIUM: 7.7 mg/dL — AB (ref 8.9–10.3)
CALCIUM: 8.3 mg/dL — AB (ref 8.9–10.3)
CO2: 23 mmol/L (ref 22–32)
CO2: 26 mmol/L (ref 22–32)
CREATININE: 0.9 mg/dL (ref 0.61–1.24)
Chloride: 101 mmol/L (ref 101–111)
Chloride: 107 mmol/L (ref 101–111)
Creatinine, Ser: 0.85 mg/dL (ref 0.61–1.24)
GFR calc non Af Amer: >60 mL/min (ref >60)
GLUCOSE: 158 mg/dL — AB (ref 65–99)
Glucose, Bld: 135 mg/dL — ABNORMAL HIGH (ref 65–99)
Potassium: 2.8 mmol/L — ABNORMAL LOW (ref 3.5–5.1)
Potassium: 3 mmol/L — ABNORMAL LOW (ref 3.5–5.1)
Sodium: 134 mmol/L — ABNORMAL LOW (ref 135–145)
Sodium: 141 mmol/L (ref 135–145)

## 2015-07-09 LAB — GLUCOSE, CAPILLARY
GLUCOSE-CAPILLARY: 169 mg/dL — AB (ref 65–99)
GLUCOSE-CAPILLARY: 101 mg/dL — AB (ref 65–99)
Glucose-Capillary: 167 mg/dL — ABNORMAL HIGH (ref 65–99)
Glucose-Capillary: 71 mg/dL (ref 65–99)
Glucose-Capillary: 70 mg/dL (ref 65–99)
Glucose-Capillary: 66 mg/dL (ref 65–99)

## 2015-07-09 LAB — CBC
HEMATOCRIT: 36 % — AB (ref 39.0–52.0)
Hemoglobin: 12.1 g/dL — ABNORMAL LOW (ref 13.0–17.0)
MCH: 31.6 pg (ref 26.0–34.0)
MCHC: 33.6 g/dL (ref 30.0–36.0)
MCV: 94 fL (ref 78.0–100.0)
Platelets: 172 10*3/uL (ref 150–400)
RBC: 3.83 MIL/uL — ABNORMAL LOW (ref 4.22–5.81)
RDW: 12.9 % (ref 11.5–15.5)
WBC: 12.5 10*3/uL — AB (ref 4.0–10.5)

## 2015-07-09 LAB — PHOSPHORUS
PHOSPHORUS: 1.5 mg/dL — AB (ref 2.5–4.6)
Phosphorus: 2.5 mg/dL (ref 2.5–4.6)

## 2015-07-09 LAB — MAGNESIUM
MAGNESIUM: 2.1 mg/dL (ref 1.7–2.4)
Magnesium: 1.9 mg/dL (ref 1.7–2.4)

## 2015-07-09 LAB — PROCALCITONIN: PROCALCITONIN: 2.36 ng/mL

## 2015-07-09 MED ORDER — DEXTROSE 50 % IV SOLN
25.0000 mL | Freq: Once | INTRAVENOUS | Status: AC
Start: 2015-07-09 — End: 2015-07-09
  Administered 2015-07-09: 25 mL via INTRAVENOUS
  Filled 2015-07-09: qty 50

## 2015-07-09 MED ORDER — SODIUM PHOSPHATE 3 MMOLE/ML IV SOLN
30.0000 mmol | Freq: Once | INTRAVENOUS | Status: AC
  Administered 2015-07-09: 30 mmol via INTRAVENOUS
  Filled 2015-07-09: qty 10

## 2015-07-09 MED ORDER — POTASSIUM CHLORIDE 10 MEQ/50ML IV SOLN
10.0000 meq | INTRAVENOUS | Status: AC
  Administered 2015-07-10 (×6): 10 meq via INTRAVENOUS
  Filled 2015-07-09 (×6): qty 50

## 2015-07-09 MED ORDER — PANTOPRAZOLE SODIUM 40 MG PO PACK
40.0000 mg | PACK | Freq: Every day | ORAL | Status: DC
  Filled 2015-07-09: qty 20

## 2015-07-09 MED ORDER — SODIUM CHLORIDE 0.9 % IV BOLUS (SEPSIS)
500.0000 mL | Freq: Once | INTRAVENOUS | Status: AC
  Administered 2015-07-09: 500 mL via INTRAVENOUS

## 2015-07-09 MED ORDER — POTASSIUM CHLORIDE 20 MEQ/15ML (10%) PO SOLN
40.0000 meq | ORAL | Status: AC
  Administered 2015-07-09 (×2): 40 meq
  Filled 2015-07-09 (×4): qty 30

## 2015-07-09 MED ORDER — INSULIN ASPART 100 UNIT/ML ~~LOC~~ SOLN
2.0000 [IU] | SUBCUTANEOUS | Status: DC
Start: 2015-07-09 — End: 2015-07-10
  Administered 2015-07-09 (×2): 4 [IU] via SUBCUTANEOUS

## 2015-07-09 MED ORDER — PANTOPRAZOLE SODIUM 40 MG IV SOLR
40.0000 mg | Freq: Every day | INTRAVENOUS | Status: DC
  Administered 2015-07-09: 40 mg via INTRAVENOUS
  Filled 2015-07-09: qty 40

## 2015-07-09 MED ORDER — DEXTROSE-NACL 5-0.9 % IV SOLN
INTRAVENOUS | Status: DC
  Administered 2015-07-09 – 2015-07-13 (×5): via INTRAVENOUS

## 2015-07-09 MED ORDER — DEXTROSE 50 % IV SOLN
INTRAVENOUS | Status: AC
  Filled 2015-07-09: qty 50

## 2015-07-09 NOTE — Progress Notes (Signed)
approx 275ml fentanyl wasted down sink. Witnessed by Loma Newton RN  Lorre Munroe

## 2015-07-09 NOTE — Procedures (Signed)
Extubation Procedure Note  Patient Details:   Name: Benjamin Park DOB: August 23, 1940 MRN: [id number]   Airway Documentation:     Evaluation  O2 sats: stable throughout Complications: No apparent complications Patient did tolerate procedure well. Bilateral Breath Sounds: Clear, Diminished   Yes  Patient extubated to 2L nasal cannula per MD order.  Positive cuff leak noted.  No evidence of stridor.  Patient able to speak post extubation.  Sats currently 100%.  Vitals are stable.  No apparent complications.  Philomena Doheny 07/09/2015, 12:10 PM

## 2015-07-09 NOTE — Care Management Important Message (Signed)
Important Message  Patient Details  Name: Benjamin Park MRN: [id number] Date of Birth: 1940-11-13   Medicare Important Message Given:  Other (see comment)    [place], Milania Haubner Abena 07/09/2015, 10:21 AM

## 2015-07-09 NOTE — Procedures (Signed)
ELECTROENCEPHALOGRAM REPORT  Date of Study: 07/09/2015  Patient's Name: Benjamin Park MRN: [id number] Date of Birth: [birth year]  Referring Provider: Dr. Jennet Maduro  Clinical History: This is a 75 year old man with altered mental status  Medications: fentaNYL (SUBLIMAZE) 2,500 mcg in sodium chloride 0.9 % 250 mL (10 mcg/mL) infusion acetaminophen (TYLENOL) solution 650 mg aspirin tablet 325 mg levothyroxine (SYNTHROID, LEVOTHROID) injection 56 mcg norepinephrine (LEVOPHED) 16 mg in dextrose 5 % 250 mL (0.064 mg/mL) infusion pantoprazole (PROTONIX) injection 40 mg piperacillin-tazobactam (ZOSYN) IVPB 3.375 g pravastatin (PRAVACHOL) tablet 20 mg vancomycin (VANCOCIN) IVPB 750 mg/150 ml premix vasopressin (PITRESSIN) 40 Units in sodium chloride 0.9 % 250 mL (0.16 Units/mL) infusion  Technical Summary: A multichannel digital EEG recording measured by the international 10-20 system with electrodes applied with paste and impedances below 5000 ohms performed as portable with EKG monitoring in an intubated and sedated patient.  Hyperventilation and photic stimulation were not performed.  The digital EEG was referentially recorded, reformatted, and digitally filtered in a variety of bipolar and referential montages for optimal display.   Description: The patient is intubated and sedated on Fentanyl during the recording. There is no clear posterior dominant rhythm. The background consists of a large amount of diffuse 4-6 Hz theta and 2-3 Hz delta slowing, with additional occasional focal delta slowing over the right parietal region, at times sharply contoured without clear epileptogenic potential. Normal sleep architecture is not seen. There is an increase in faster frequencies with stimulation. Hyperventilation and photic stimulation were not performed.  There were no clear epileptiform discharges or electrographic seizures seen.    EKG lead was unremarkable.  Impression: This sedated EEG is  abnormal due to the presence of: 1. Moderate diffuse slowing of the background 2. Additional occasional focal slowing over the right parietal region  Clinical Correlation of the above findings indicates diffuse cerebral dysfunction that is non-specific in etiology and can be seen with hypoxic/ischemic injury, toxic/metabolic encephalopathies, or medication effect. Additional focal slowing over the right parietal region indicates focal cerebral dysfunction in this region suggestive of underlying structural or physiologic abnormality.  The absence of epileptiform discharges does not rule out a clinical diagnosis of epilepsy.  Clinical correlation is advised.   Ellouise Newer, M.D.

## 2015-07-09 NOTE — Progress Notes (Signed)
[place] Progress Note Patient Name: Benjamin Park DOB: Oct 07, 1940 MRN: [id number]   Date of Service  07/09/2015  HPI/Events of Note  Hypokalemia  eICU Interventions  Potassium replaced     Intervention Category Intermediate Interventions: Electrolyte abnormality - evaluation and management  Naquita Nappier 07/09/2015, 11:56 PM

## 2015-07-09 NOTE — Progress Notes (Signed)
SLP Cancellation Note  Patient Details Name: Benjamin Park MRN: [id number] DOB: 1940-05-19   Cancelled treatment:       Reason Eval/Treat Not Completed: Patient not medically ready, remains intubated this morning. Will continue to follow.   [name], M.A. CCC-SLP [phone]  [name] 07/09/2015, 10:42 AM

## 2015-07-09 NOTE — Progress Notes (Signed)
Pt has become increasingly confused, picking at items; concern for hypercapnia vs other etiology. MD Wallis Bamberg made aware. ABG obtained and results called to him by [facility] RT.   Will monitor  Benjamin Park Hypoglycemic Event  CBG: 66  Treatment: D50 IV 25 mL  Symptoms: Nervous/irritable   Possible Reasons for Event: Inadequate meal intake  Comments/MD notified:Md Somers- see new orders    Benjamin Park

## 2015-07-09 NOTE — Progress Notes (Signed)
[place] Progress Note Patient Name: Benjamin Park DOB: 07/03/1940 MRN: [id number]   Date of Service  07/09/2015  HPI/Events of Note  Hypoglycemia - Blood glucose = 66.  eICU Interventions  Will change 0.9 NaCl IV fluid to D5 0.9 NaCl to run IV at 100 mL/hour.      Intervention Category Major Interventions: Other:  Lysle Dingwall 07/09/2015, 6:46 PM

## 2015-07-09 NOTE — Progress Notes (Signed)
PULMONARY / CRITICAL CARE MEDICINE   Name: Benjamin Park MRN: [id number] DOB: 1940-11-25    ADMISSION DATE:  07/16/2015 CONSULTATION DATE:  07/08/15  REFERRING MD:  EDP  CHIEF COMPLAINT:  AMS  HISTORY OF PRESENT ILLNESS:  Pt is encephelopathic; therefore, this HPI is obtained from chart review. Benjamin Park is a 75 y.o. male with PMH as outlined below including uncontrolled hypertension for which he has had multiple admissions.  He was brought to [facility] ED 04/19 evening by his wife who informed staff that he was having a stroke due to the fact that he was unable to follow commands and had gargled speech.  He has hx of hemorrhagic stroke and apparently presented very similarly at the time.  He was last seen normal around 1pm earlier that afternoon.  Wife stated that he never had any focal deficits, only gargled speech and inability follow commands.  In ED, he had elevated BP's into the low 200's.  He was started on nicardipine and after only 3 - 5 minutes, BP dropped into low 100's per ED RN.  He was evaluated by neurology and initial head CT was negative for acute infarct or hemorrhage.  Per wife, pt's BP was up to 165 earlier in the day and she tried to give him his oral meds; however, he was unable to swallow them due to vomiting. He was initially being admitted by [facility]; however, while in ED, he had sudden change in mental status to the point where he became minimally responsive.  He also had BP down to 98 systolic along with hypoxia.  ABG revealed pO2 of 50 and repeat CXR showed new LLL infiltrate, likely due to aspiration PNA.  Due to his rapid decline, he was intubated for airway protection and PCCM was called to admit him to the ICU for further evaluation and management.  He has brain MRI pending.  SUBJECTIVE: Intubated. Alert, following commands. Tolerating PS.  Levophed and Vasopressin on board  VITAL SIGNS: BP 139/86 mmHg  Pulse 60  Temp(Src) 98.6 F (37 C) (Rectal)  Resp 14  Ht  6' (1.829 m)  Wt 164 lb 10.9 oz (74.7 kg)  BMI 22.33 kg/m2  SpO2 100%  HEMODYNAMICS: CVP:  [3 mmHg-73 mmHg] 4 mmHg  VENTILATOR SETTINGS: Vent Mode:  [-] PRVC FiO2 (%):  [40 %-50 %] 40 % Set Rate:  [14 bmp] 14 bmp Vt Set:  [550 mL] 550 mL PEEP:  [5 cmH20] 5 cmH20 Plateau Pressure:  [17 cmH20-27 cmH20] 18 cmH20  INTAKE / OUTPUT: I/O last 3 completed shifts: In: 5698.9 [I.V.:4356.8; Other:10; NG/GT:422.1; IV Piggyback:910] Out: [id number] [Urine:1695]   PHYSICAL EXAMINATION: General: Elderly male, chronically ill appearing, in NAD. Neuro: Alert, following commands, no focal deficits. HEENT: /AT. PERRL, sclerae anicteric. Cardiovascular: RRR, no M/R/G.  Lungs: Respirations even and unlabored.  CTA bilaterally, No W/R/R. Abdomen: BS x 4, soft, NT/ND.  Musculoskeletal: No gross deformities, no edema.  Skin: Intact, warm, no rashes.  LABS:  BMET  Recent Labs Lab 06/25/2015 1843 06/25/2015 1852 07/08/15 0105 07/09/15 0505  NA 139 141 137 134*  K 3.4* 3.3* 4.1 2.8*  CL 102 100* 101 101  CO2 25  --  24 23  BUN 23* 28* 23* 21*  CREATININE 1.09 1.00 1.13 0.85  GLUCOSE 159* 155* 125* 158*    Electrolytes  Recent Labs Lab 07/12/2015 1843  07/08/15 0105 07/08/15 1133 07/08/15 2129 07/09/15 0505  CALCIUM 9.4  --  8.3*  --   --  7.7*  MG  --   < > 1.5* 2.4 2.1 1.9  PHOS  --   < > 2.0* 3.9 2.3* 1.5*  < > = values in this interval not displayed.  CBC  Recent Labs Lab 07/10/2015 1843 07/15/2015 1852 07/08/15 0105 07/09/15 0505  WBC 10.3  --  9.5 12.5*  HGB 15.8 17.7* 14.4 12.1*  HCT 46.5 52.0 43.3 36.0*  PLT 190  --  178 172    Coag's  Recent Labs Lab 07/15/2015 1843  APTT 32  INR 1.15    Sepsis Markers  Recent Labs Lab 07/08/15 0105 07/08/15 0120 07/09/15 0505  LATICACIDVEN 2.0 1.90  --   PROCALCITON 0.55  --  2.36    ABG  Recent Labs Lab 07/08/15 0410 07/08/15 1330 07/09/15 0409  PHART 7.452* 7.343* 7.379  PCO2ART 32.2* 42.2 44.0  PO2ART 110*  97.0 161.0*    Liver Enzymes  Recent Labs Lab 07/15/2015 1843  AST 21  ALT 20  ALKPHOS 77  BILITOT 0.8  ALBUMIN 3.9    Cardiac Enzymes  Recent Labs Lab 07/08/15 0105  TROPONINI 0.05*    Glucose  Recent Labs Lab 07/08/15 0247 07/08/15 1108 07/08/15 1537 07/08/15 1932 07/08/15 2343 07/09/15 0334  GLUCAP 99 121* 185* 157* 170* 167*    Imaging Mr Brain Wo Contrast  07/08/2015  CLINICAL DATA:  Aphasia and change in mental status. Symptoms began and yesterday. The patient then became unresponsive and was intubated. EXAM: MRI HEAD WITHOUT CONTRAST TECHNIQUE: Multiplanar, multiecho pulse sequences of the brain and surrounding structures were obtained without intravenous contrast. COMPARISON:  CT head without contrast 07/08/2015. MRI brain 01/11/2015. FINDINGS: Advanced atrophy and diffuse white matter changes are evident bilaterally. There is diffuse white matter change within both the splenium and the anterior genu of the corpus callosum. Diffuse atrophy of the corpus callosum is noted. These findings are similar the prior study. Increased T2 signal is present within these cerebellar peduncle is bilaterally. Focal susceptibility within the dentate nuclei is stable. There is significant T2 shine through on the diffusion weighted images, similar to the prior exams. No focal restricted diffusion is present on the ADC map sub to suggest acute or subacute infarction. No acute hemorrhage or mass lesion is present. The ventricles are proportionate to the degree of atrophy and stable since the prior study. No significant extra-axial fluid collection is present. Flow is present in the major intracranial arteries. The globes and orbits are intact. Chronic left maxillary sinus disease is present with near complete opacification of the left maxillary sinus. Asymmetric opacification of ethmoid air cells is noted on the left. Mild mucosal thickening is present in the sphenoid sinuses and frontal  sinuses. There is some fluid in the mastoid air cells bilaterally without an obstructing nasopharyngeal lesion. Midline sagittal images demonstrate no focal lesions. Moderate degenerative changes are noted in the upper cervical spine, similar to the prior study. IMPRESSION: 1. No acute intracranial abnormality or significant interval change. 2. Advanced atrophy and diffuse white matter disease bilaterally. Extensive subcortical white matter changes bilaterally are likely related to the remote ischemic disease. These findings are stable. 3. 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 Electronically Signed   By: San Morelle M.D.   On: 07/08/2015 17:05   Dg Chest Port 1 View  07/09/2015  CLINICAL DATA:  Aspiration pneumonia, intubated patient, history of TIA. EXAM: PORTABLE CHEST 1 VIEW COMPARISON:  Portable chest x-ray of July 08, 2015. FINDINGS: The right lung is adequately inflated  and clear. On the left there is persistent retrocardiac and lower lobe density slightly more conspicuous today. The cardiac silhouette is normal in size. The pulmonary vascularity is not engorged. The endotracheal tube tip lies approximately 3.9 cm above the carina. The esophagogastric tube tip projects below the inferior margin of the image. The right internal jugular venous catheter tip projects over the midportion of the SVC. IMPRESSION: Slight interval increase in left lower lobe parenchymal density consistent with aspiration pneumonia. The right lung is clear. There is no pulmonary edema. The support tubes are in reasonable position. Electronically Signed   By: David  Martinique M.D.   On: 07/09/2015 07:27   Dg Chest Port 1 View  07/08/2015  CLINICAL DATA:  Central line placement. EXAM: PORTABLE CHEST 1 VIEW COMPARISON:  Earlier film, same date. FINDINGS: The right IJ center venous catheter tip is in the distal SVC near the cavoatrial junction. The endotracheal tube and NG tubes are stable. The heart and lungs are  relatively stable. Slight improved aeration in the left lower lobe. IMPRESSION: Right IJ center venous catheter tip is in the distal SVC near the cavoatrial junction. No complicating features. The other support apparatus is stable. Slight improved left lower lobe aeration. Electronically Signed   By: Marijo Sanes M.D.   On: 07/08/2015 10:51     STUDIES:  CT head 04/19 > no acute finding.  Chronic small vessel ischemic changes. CXR 04/20 > focal consolidation LLL and developing consolidation RUL. Brain MRI 04/20 > no acute finding. Advanced atrophy and diffuse white matter disease b/l.   CULTURES: Blood 04/20 > NG <24 hours Sputum 04/20 >   ANTIBIOTICS: Zosyn 04/20 >>> Vanc 4/20>>>  SIGNIFICANT EVENTS: 04/20 > admitted with hypertensive urgency, AMS, ? PRES, ? CVA, aspiration PNA, acute hypoxic respiratory failure.  LINES/TUBES: ETT 04/20 >  DISCUSSION: 75 y.o. M with hx uncontrolled HTN and previous hemorrhagic stroke, admitted 04/20 with hypertensive crisis.  While in ED, had worsening mental status along with hypoxic respiratory failure, likely due to aspiration PNA.  He required intubation and PCCM was called to admit to ICU.  Brain MRI pending to rule out CVA and further evaluate for PRES.  ASSESSMENT / PLAN:  NEUROLOGIC A:   Acute encephalopathy - unclear etiology at this point but likely exacerbated by hypertension.  Initial head CT negative for acute infarct or hemorrhage; however, history and exam still concerning for underlying ischemic stroke. Consider PRES given hx of severe uncontrolled HTN. Hx TIA, CVA, SAH, hypertensive encephalopathy, headache. P:   Mental status improved today, alert and following commands Sedation:  Fentanyl gtt / Midazolam PRN. Wean as tolerated.  RASS goal: 0 to -1. Daily WUA. MRI brain with no acute findings.  Neurology following, appreciate the assistance. MAP goals, see cvs  CARDIOVASCULAR A:  Hypertensive urgency/emergency -  resolved after cardene gtt started; cardene now off. Septic shock Hx HTN, HLD, MI. Autoregulation concerns now in shock P:  Continue outpatient ASA, lovastatin. MAP goal is 25% -30% reduction from highest MAP to allow autoregulation, goal MAP 80-110 Cortisol > 100, no role stress steroids Levophed and Vasopressin to these goals, vaso to off first cvp 5, rebouls  PULMONARY A: Acute hypoxic respiratory failure - likely due to aspiration. Probable aspiration PNA. Hx OSA - on CPAP. P:   SBP, wean as tolerated VAP prevention measures. Abx / cultures per ID section. CXR increase LLL denisty   RENAL A:   Hypokalemia - resolved HypoMg HypoPhos Hyponatremia cvp low P:  NS @ 100. bolus BMP in AM. Replace Mg, Phos k supp  GASTROINTESTINAL A:   GI prophylaxis. Nutrition. Hx dysphagia - has apparently failed swallow evals in the past and refuses to use thickening solution. P:   SUP: Pantoprazole. NPO. SLP consult once extubated. May need to hold TF  HEMATOLOGIC A:   VTE Prophylaxis. P:  SCD's / heparin. CBC in AM.  INFECTIOUS A:   Probable aspiration PNA. Septic shock P:   Continue Vanc/Zosyn pending cultures, about 2 days no mrsa, dc vanc UA negative Follow cultures PCT elevated, 0.55, repeat 2.36  ENDOCRINE A:   Hypothyroidism. P:   Continue outpatient synthroid, change to IV formulation. TSH wnl   Family updated: None.  Interdisciplinary Family Meeting v Palliative Care Meeting:  Due by: 07/14/15.  Maryellen Pile, MD IMTS PGY-1 [phone]  STAFF NOTE: I, Merrie Roof, MD FACP have personally reviewed patient's available data, including medical history, events of note, physical examination and test results as part of my evaluation. I have discussed with resident/NP and other care providers such as pharmacist, RN and RRT. In addition, I personally evaluated patient and elicited key findings of: awakens follows commands, HTnNcontrol further with  MAP goals higher to avoid poor autoregulation, wean as mental status improved, eeg on going, hope to extubate, if unable then start TF , dc vanc, follow gram neg rod in sputum on stain, keep zosyn for now, cortisol noted, may indicate poor prognosis, no role steroids, cvp low bolus again, dc vaso first if able to above goals The patient is critically ill with multiple organ systems failure and requires high complexity decision making for assessment and support, frequent evaluation and titration of therapies, application of advanced monitoring technologies and extensive interpretation of multiple databases.   Critical Care Time devoted to patient care services described in this note is 30 Minutes. This time reflects time of care of this signee: Merrie Roof, MD FACP. This critical care time does not reflect procedure time, or teaching time or supervisory time of PA/NP/Med student/Med Resident etc but could involve care discussion time. Rest per NP/medical resident whose note is outlined above and that I agree with   [name]. Titus Mould, MD, [place] Pgr: [facility] 07/09/2015 9:32 AM

## 2015-07-09 NOTE — Progress Notes (Addendum)
[facility] Progress Note Patient Name: Benjamin Park DOB: 12-23-1940 MRN: [id number]   Date of Service  07/09/2015  HPI/Events of Note  Increasing lethargy. O2 sat = 98%.  eICU Interventions  Will check ABG and blood glucose now.      Intervention Category Major Interventions: Change in mental status - evaluation and management  Sommer,Steven Eugene 07/09/2015, 6:30 PM

## 2015-07-09 NOTE — Progress Notes (Signed)
EEG Completed; Results Pending  

## 2015-07-10 LAB — BASIC METABOLIC PANEL
ANION GAP: 8 (ref 5–15)
BUN: 7 mg/dL (ref 6–20)
CALCIUM: 8.4 mg/dL — AB (ref 8.9–10.3)
CO2: 27 mmol/L (ref 22–32)
Chloride: 107 mmol/L (ref 101–111)
Creatinine, Ser: 0.96 mg/dL (ref 0.61–1.24)
GFR calc Af Amer: >60 mL/min (ref >60)
GLUCOSE: 123 mg/dL — AB (ref 65–99)
POTASSIUM: 3.3 mmol/L — AB (ref 3.5–5.1)
Sodium: 142 mmol/L (ref 135–145)

## 2015-07-10 LAB — GLUCOSE, CAPILLARY
GLUCOSE-CAPILLARY: 110 mg/dL — AB (ref 65–99)
GLUCOSE-CAPILLARY: 108 mg/dL — AB (ref 65–99)
Glucose-Capillary: 100 mg/dL — ABNORMAL HIGH (ref 65–99)
Glucose-Capillary: 110 mg/dL — ABNORMAL HIGH (ref 65–99)
Glucose-Capillary: 122 mg/dL — ABNORMAL HIGH (ref 65–99)
Glucose-Capillary: 125 mg/dL — ABNORMAL HIGH (ref 65–99)

## 2015-07-10 LAB — MAGNESIUM
MAGNESIUM: 2.1 mg/dL (ref 1.7–2.4)
Magnesium: 2 mg/dL (ref 1.7–2.4)

## 2015-07-10 LAB — PROCALCITONIN: PROCALCITONIN: 1.31 ng/mL

## 2015-07-10 LAB — PHOSPHORUS: PHOSPHORUS: 1.1 mg/dL — AB (ref 2.5–4.6)

## 2015-07-10 MED ORDER — RESOURCE THICKENUP CLEAR PO POWD
ORAL | Status: DC | PRN
  Filled 2015-07-10 (×2): qty 125

## 2015-07-10 MED ORDER — LEVOTHYROXINE SODIUM 112 MCG PO TABS
112.0000 ug | ORAL_TABLET | Freq: Every day | ORAL | Status: DC
  Administered 2015-07-11 – 2015-07-13 (×3): 112 ug via ORAL
  Filled 2015-07-10 (×3): qty 1

## 2015-07-10 MED ORDER — SODIUM PHOSPHATE 3 MMOLE/ML IV SOLN
30.0000 mmol | Freq: Once | INTRAVENOUS | Status: AC
  Administered 2015-07-10: 30 mmol via INTRAVENOUS
  Filled 2015-07-10: qty 10

## 2015-07-10 MED ORDER — DEXTROSE 5 % IV SOLN: 24.0000 mmol | Freq: Once | INTRAVENOUS | Status: DC

## 2015-07-10 MED ORDER — LABETALOL HCL 100 MG PO TABS
50.0000 mg | ORAL_TABLET | Freq: Two times a day (BID) | ORAL | Status: DC
  Administered 2015-07-10 – 2015-07-12 (×5): 50 mg via ORAL
  Filled 2015-07-10 (×3): qty 1
  Filled 2015-07-10 (×2): qty 0.5
  Filled 2015-07-10: qty 1

## 2015-07-10 MED ORDER — DEXTROSE 5 % IV SOLN
30.0000 mmol | Freq: Once | INTRAVENOUS | Status: DC
  Filled 2015-07-10: qty 10

## 2015-07-10 MED ORDER — INSULIN ASPART 100 UNIT/ML ~~LOC~~ SOLN
0.0000 [IU] | Freq: Three times a day (TID) | SUBCUTANEOUS | Status: DC
  Administered 2015-07-10 – 2015-07-14 (×4): 1 [IU] via SUBCUTANEOUS

## 2015-07-10 NOTE — Progress Notes (Signed)
PULMONARY / CRITICAL CARE MEDICINE   Name: Benjamin Park MRN: [id number] DOB: 12/01/1940    ADMISSION DATE:  07/03/2015 CONSULTATION DATE:  07/08/15  REFERRING MD:  EDP  CHIEF COMPLAINT:  AMS  HISTORY OF PRESENT ILLNESS:  Pt is encephelopathic; therefore, this HPI is obtained from chart review. Benjamin Park is a 75 y.o. male with PMH as outlined below including uncontrolled hypertension for which he has had multiple admissions.  He was brought to [facility] ED 04/19 evening by his wife who informed staff that he was having a stroke due to the fact that he was unable to follow commands and had gargled speech.  He has hx of hemorrhagic stroke and apparently presented very similarly at the time.  He was last seen normal around 1pm earlier that afternoon.  Wife stated that he never had any focal deficits, only gargled speech and inability follow commands.  In ED, he had elevated BP's into the low 200's.  He was started on nicardipine and after only 3 - 5 minutes, BP dropped into low 100's per ED RN.  He was evaluated by neurology and initial head CT was negative for acute infarct or hemorrhage.  Per wife, pt's BP was up to 165 earlier in the day and she tried to give him his oral meds; however, he was unable to swallow them due to vomiting. He was initially being admitted by [facility]; however, while in ED, he had sudden change in mental status to the point where he became minimally responsive.  He also had BP down to 98 systolic along with hypoxia.  ABG revealed pO2 of 50 and repeat CXR showed new LLL infiltrate, likely due to aspiration PNA.  Due to his rapid decline, he was intubated for airway protection and PCCM was called to admit him to the ICU for further evaluation and management.  He has brain MRI pending.  SUBJECTIVE: Extubated 4/21  Weaned off pressors  Intermittent confusion but follows commands,  Wife updated at bedside.  Speech eval with D2 diet rec   VITAL SIGNS: BP 160/103 mmHg  Pulse  92  Temp(Src) 98.4 F (36.9 C) (Core (Comment))  Resp 19  Ht 6' (1.829 m)  Wt 158 lb 4.6 oz (71.8 kg)  BMI 21.46 kg/m2  SpO2 96%  HEMODYNAMICS:    VENTILATOR SETTINGS:    INTAKE / OUTPUT: I/O last 3 completed shifts: In: 5883.6 [I.V.:4270.1; Other:10; NG/GT:640; IV Piggyback:963.5] Out: 5485 [Urine:5485]   PHYSICAL EXAMINATION: General: Elderly male, chronically ill appearing, in NAD. Neuro: Alert, following commands, gen weakness  HEENT: [place]/AT. PERRL, sclerae anicteric. Cardiovascular: RRR, no M/R/G.  Lungs: Respirations even and unlabored.  CTA bilaterally, No W/R/R. Abdomen: BS x 4, soft, NT/ND.  Musculoskeletal: No gross deformities, no edema.  Skin: Intact, warm, no rashes.  LABS:  BMET  Recent Labs Lab 07/09/15 0505 07/09/15 2017 07/10/15 0300  NA 134* 141 142  K 2.8* 3.0* 3.3*  CL 101 107 107  CO2 23 26 27   BUN 21* 13 7  CREATININE 0.85 0.90 0.96  GLUCOSE 158* 135* 123*    Electrolytes  Recent Labs Lab 07/09/15 0505 07/09/15 1434 07/09/15 2017 07/10/15 0300 07/10/15 1341  CALCIUM 7.7*  --  8.3* 8.4*  --   MG 1.9 2.1  --  2.1 2.0  PHOS 1.5* 2.5  --  1.1*  --     CBC  Recent Labs Lab 06/28/2015 1843 07/01/2015 1852 07/08/15 0105 07/09/15 0505  WBC 10.3  --  9.5  12.5*  HGB 15.8 17.7* 14.4 12.1*  HCT 46.5 52.0 43.3 36.0*  PLT 190  --  178 172    Coag's  Recent Labs Lab 06/28/2015 1843  APTT 32  INR 1.15    Sepsis Markers  Recent Labs Lab 07/08/15 0105 07/08/15 0120 07/09/15 0505 07/10/15 0300  LATICACIDVEN 2.0 1.90  --   --   PROCALCITON 0.55  --  2.36 1.31    ABG  Recent Labs Lab 07/08/15 1330 07/09/15 0409 07/09/15 1841  PHART 7.343* 7.379 7.507*  PCO2ART 42.2 44.0 24.9*  PO2ART 97.0 161.0* 70.0*    Liver Enzymes  Recent Labs Lab 07/03/2015 1843  AST 21  ALT 20  ALKPHOS 77  BILITOT 0.8  ALBUMIN 3.9    Cardiac Enzymes  Recent Labs Lab 07/08/15 0105  TROPONINI 0.05*    Glucose  Recent  Labs Lab 07/09/15 1841 07/09/15 1929 07/09/15 2330 07/10/15 0330 07/10/15 0746 07/10/15 1204  GLUCAP 66 101* 110* 110* 108* 125*    Imaging No results found.   STUDIES:  CT head 04/19 > no acute finding.  Chronic small vessel ischemic changes. CXR 04/20 > focal consolidation LLL and developing consolidation RUL. Brain MRI 04/20 > no acute finding. Advanced atrophy and diffuse white matter disease b/l.   CULTURES: Blood 04/20 > NG <24 hours Sputum 04/20 >   ANTIBIOTICS: Zosyn 04/20 >>> Vanc 4/20>>>  SIGNIFICANT EVENTS: 04/20 > admitted with hypertensive urgency, AMS, ? PRES, ? CVA, aspiration PNA, acute hypoxic respiratory failure.  LINES/TUBES: ETT 04/20 >4/21  DISCUSSION: 75 y.o. M with hx uncontrolled HTN and previous hemorrhagic stroke, admitted 04/20 with hypertensive crisis.  While in ED, had worsening mental status along with hypoxic respiratory failure, likely due to aspiration PNA.  He required intubation and PCCM was called to admit to ICU.  Brain MRI pending to rule out CVA and further evaluate for PRES.  ASSESSMENT / PLAN:  NEUROLOGIC A:   Acute encephalopathy - unclear etiology at this point but likely exacerbated by hypertension.  Initial head CT negative for acute infarct or hemorrhage; however, history and exam still concerning for underlying ischemic stroke. Consider PRES given hx of severe uncontrolled HTN. Hx TIA, CVA, SAH, hypertensive encephalopathy, headache. P:   Mental status improved today, alert and following commands MRI brain with no acute findings.  Neurology following, appreciate the assistance. MAP goals, see cvs  CARDIOVASCULAR A:  Hypertensive urgency/emergency - resolved after cardene gtt started; cardene now off. Septic shock>resolved , off pressors 4/22  Hx HTN, HLD, MI. 4/22>b/p tr up  P:  Continue outpatient ASA, lovastatin. MAP goal is 25% -30% reduction from highest MAP to allow autoregulation, goal MAP 80-110 Cortisol  > 100, no role stress steroids Restart labetolol 50mg  Twice daily  (1/2 home dose )    PULMONARY A: Acute hypoxic respiratory failure - likely due to aspiration.>extubaed 4/21  Probable aspiration PNA. Hx OSA - on CPAP. P:     Abx / cultures per ID section. Check cxr in am   RENAL A:   Hypokalemia - resolved HypoMg HypoPhos Hyponatremia cvp low P:   NS @ 100. bolus BMP in AM. Replace Mg, Phos k supp  GASTROINTESTINAL A:   GI prophylaxis. Nutrition. Hx dysphagia - has apparently failed swallow evals in the past and refuses to use thickening solution. -4/22 ST eval with D2 diet rec  P:   SUP: Pantoprazole. D2 diet     HEMATOLOGIC A:   VTE Prophylaxis. P:  SCD's /  heparin. CBC in AM.  INFECTIOUS A:   Probable aspiration PNA. Septic shock-resolved  P:   Continue Vanc/Zosyn pending cultures, about 2 days no mrsa, dc vanc UA negative Follow cultures   ENDOCRINE A:   Hypothyroidism. P:   Continue outpatient synthroid   TSH wnl   Family updated: None.  Interdisciplinary Family Meeting v Palliative Care Meeting:  Due by: 07/14/15.  Jyrah Blye NP-C  [facility]  [phone]

## 2015-07-10 NOTE — Evaluation (Signed)
Clinical/Bedside Swallow Evaluation Patient Details  Name: Benjamin Park MRN: [id number] Date of Birth: [birth year]  Today's Date: 07/10/2015 Time: SLP Start Time (ACUTE ONLY): [id number] SLP Stop Time (ACUTE ONLY): 1016 SLP Time Calculation (min) (ACUTE ONLY): 24 min  Past Medical History:  Past Medical History  Diagnosis Date  . Hx of transient ischemic attack (TIA)   . Hyperlipidemia   . ED (erectile dysfunction)   . Personal history of kidney stones   . Subarachnoid hemorrhage ([place])     '11- hospitalized -3 to 4 days(passed out-awoke in hospital)- no residual affects.  . Myocardial infarction ([place])     12/1991  . Dysphagia   . Hypokalemia   . Hypothyroidism   . Sleep apnea     on CPAP-settings 6  . History of kidney stones 02-27-13    x5- none recent  . Throat cancer [facility])     '00-radiation therapy  . Stroke ([place])   . Unspecified cerebral artery occlusion with cerebral infarction 11/08/2012  . Episodic confusion 02-27-13    hx. of, not currently(multiple episodes- ? TIA's)-always saw MD to be evaluated.  Marland Kitchen AKI (acute kidney injury) ([place]) 02/23/2011    pt. denies any problems 02-27-13  . Hypertension     hx. labile - with spikes every 3-6 mos.-last 8'14 ED visit.  Marland Kitchen Hypertensive encephalopathy   . Headache(784.0)     episodic with periods of confusion -multiple  . Neuromuscular disorder ([place])     "parkinson's" -no tremor-sees neurology MD at [facility]  . Coronary artery disease     Dr. [place]   Past Surgical History:  Past Surgical History  Procedure Laterality Date  . Angioplasty  1993    LAD  . Cardiac catheterization    . Coronary angioplasty    . Basal cell cancer      head  . Hernia repair    . Appendectomy    . Patent foramen ovale closure  2008  . Eye surgery    . Colonoscopy N/A 06/18/2012    Procedure: COLONOSCOPY;  Surgeon: Jeryl Columbia, MD;  Location: [facility] ENDOSCOPY;  Service: Endoscopy;  Laterality: N/A;  h&p in file-[place]   . Hot hemostasis N/A 06/18/2012   Procedure: HOT HEMOSTASIS (ARGON PLASMA COAGULATION/BICAP);  Surgeon: Jeryl Columbia, MD;  Location: [facility] ENDOSCOPY;  Service: Endoscopy;  Laterality: N/A;  . Colonoscopy with propofol N/A 03/07/2013    Procedure: COLONOSCOPY WITH PROPOFOL;  Surgeon: Jeryl Columbia, MD;  Location: [facility] ENDOSCOPY;  Service: Endoscopy;  Laterality: N/A;   HPI:  75 y.o. male with PMH as outlined below including uncontrolled hypertension for which he has had multiple admissions. He was brought to [facility] ED 04/19 evening by his wife who informed staff that he was having a stroke due to the fact that he was unable to follow commands and had gargled speech. He has hx of hemorrhagic stroke and apparently presented very similarly at the time. He was last seen normal around 1pm earlier that afternoon. Wife stated that he never had any focal deficits, only gargled speech and inability follow commands. CXR on 07/09/15 indicated LLL PNA; likely aspiration PNA; MR head on 07/08/15 indicated no acute intracranial abnormality or significant change   Assessment / Plan / Recommendation Clinical Impression   Pt pleasantly confused during BSE, but was able to indicate he is on thickened liquids at home and has had difficulty with swallowing in the past d/t a CVA he sustained previously; pt with suspected delay in the initiation  of the swallow, multiple swallows and delayed cough with tsp thin liquids and ice chips; pt c/o globus sensation intermittently with meals; pt able to swallow cup sips of nectar-thickened liquids with visual/tactile cues from SLP (minimal) and cues to swallow small sips/bites of puree/nectar-thickened liquids; impaired mastication present with solid consistency and prolonged oral transit; pt did not have dentures present for BSE, but upgrade probable with diet tolerance of Dysphagia 2/nectar-thickened liquids.  ST to f/u while in hospital for diet tolerance and D/C plans TBD    Aspiration Risk  Moderate aspiration risk;Other  (comment) (without nectar-thickened liquids)    Diet Recommendation   Dysphagia 2/nectar-thickened liquids  Medication Administration: Crushed with puree    Other  Recommendations Oral Care Recommendations: Oral care BID Other Recommendations: Order thickener from pharmacy   Follow up Recommendations  Other (comment) (TBD)    Frequency and Duration min 2x/week  1 week       Prognosis Prognosis for Safe Diet Advancement: Good Barriers to Reach Goals: Cognitive deficits      Swallow Study   General Date of Onset: 07/08/15 HPI: 75 y.o. male with PMH as outlined below including uncontrolled hypertension for which he has had multiple admissions. He was brought to [facility] ED 04/19 evening by his wife who informed staff that he was having a stroke due to the fact that he was unable to follow commands and had gargled speech. He has hx of hemorrhagic stroke and apparently presented very similarly at the time. He was last seen normal around 1pm earlier that afternoon. Wife stated that he never had any focal deficits, only gargled speech and inability follow commands.  Type of Study: Bedside Swallow Evaluation Diet Prior to this Study: NPO Temperature Spikes Noted: No Respiratory Status: Nasal cannula History of Recent Intubation: Yes Length of Intubations (days): 1 days Date extubated: 07/09/15 Behavior/Cognition: Alert;Cooperative;Confused Oral Cavity Assessment: Dried secretions Oral Care Completed by SLP: Yes Oral Cavity - Dentition: Edentulous Vision: Functional for self-feeding Self-Feeding Abilities: Able to feed self Patient Positioning: Upright in chair Baseline Vocal Quality: Hoarse Volitional Cough: Strong Volitional Swallow: Able to elicit    Oral/Motor/Sensory Function Overall Oral Motor/Sensory Function: Within functional limits   Ice Chips Ice chips: Impaired Presentation: Spoon Pharyngeal Phase Impairments: Suspected delayed Swallow;Multiple swallows   Thin Liquid  Thin Liquid: Impaired Presentation: Spoon Oral Phase Functional Implications: Oral holding Pharyngeal  Phase Impairments: Suspected delayed Swallow;Multiple swallows    Nectar Thick Nectar Thick Liquid: Within functional limits Presentation: Cup;Spoon   Honey Thick Honey Thick Liquid: Not tested   Puree Puree: Impaired Presentation: Spoon Pharyngeal Phase Impairments: Suspected delayed Swallow;Multiple swallows   Solid      Solid: Impaired Presentation: Spoon Oral Phase Impairments: Impaired mastication Oral Phase Functional Implications: Impaired mastication Pharyngeal Phase Impairments: Suspected delayed Swallow;Multiple swallows        Cashmere Dingley,PAT, M.S., CCC-SLP 07/10/2015,10:47 AM

## 2015-07-11 ENCOUNTER — Inpatient Hospital Stay (HOSPITAL_COMMUNITY): Payer: Medicare HMO

## 2015-07-11 DIAGNOSIS — R296 Repeated falls: Secondary | ICD-10-CM

## 2015-07-11 DIAGNOSIS — I161 Hypertensive emergency: Secondary | ICD-10-CM

## 2015-07-11 LAB — BASIC METABOLIC PANEL
Anion gap: 10 (ref 5–15)
BUN: 7 mg/dL (ref 6–20)
CALCIUM: 8.5 mg/dL — AB (ref 8.9–10.3)
CO2: 27 mmol/L (ref 22–32)
Chloride: 107 mmol/L (ref 101–111)
Creatinine, Ser: 0.96 mg/dL (ref 0.61–1.24)
GFR calc Af Amer: >60 mL/min (ref >60)
GLUCOSE: 97 mg/dL (ref 65–99)
Potassium: 2.8 mmol/L — ABNORMAL LOW (ref 3.5–5.1)
Sodium: 144 mmol/L (ref 135–145)

## 2015-07-11 LAB — GLUCOSE, CAPILLARY
GLUCOSE-CAPILLARY: 115 mg/dL — AB (ref 65–99)
Glucose-Capillary: 99 mg/dL (ref 65–99)
Glucose-Capillary: 129 mg/dL — ABNORMAL HIGH (ref 65–99)
Glucose-Capillary: 91 mg/dL (ref 65–99)

## 2015-07-11 LAB — CBC
HCT: 36 % — ABNORMAL LOW (ref 39.0–52.0)
HEMOGLOBIN: 12 g/dL — AB (ref 13.0–17.0)
MCH: 31.8 pg (ref 26.0–34.0)
MCHC: 33.3 g/dL (ref 30.0–36.0)
MCV: 95.5 fL (ref 78.0–100.0)
PLATELETS: 153 10*3/uL (ref 150–400)
RBC: 3.77 MIL/uL — AB (ref 4.22–5.81)
RDW: 13 % (ref 11.5–15.5)
WBC: 8.8 10*3/uL (ref 4.0–10.5)

## 2015-07-11 LAB — PHOSPHORUS: Phosphorus: 2.8 mg/dL (ref 2.5–4.6)

## 2015-07-11 MED ORDER — LORAZEPAM 2 MG/ML IJ SOLN
0.5000 mg | Freq: Once | INTRAMUSCULAR | Status: AC
  Administered 2015-07-11: 0.5 mg via INTRAVENOUS

## 2015-07-11 MED ORDER — LORAZEPAM 2 MG/ML IJ SOLN
INTRAMUSCULAR | Status: AC
  Filled 2015-07-11: qty 1

## 2015-07-11 MED ORDER — POTASSIUM CHLORIDE CRYS ER 20 MEQ PO TBCR: 40.0000 meq | EXTENDED_RELEASE_TABLET | ORAL | Status: DC

## 2015-07-11 MED ORDER — POTASSIUM CHLORIDE 20 MEQ PO PACK
40.0000 meq | PACK | ORAL | Status: AC
  Administered 2015-07-11 (×2): 40 meq via ORAL
  Filled 2015-07-11 (×2): qty 2

## 2015-07-11 NOTE — Progress Notes (Signed)
Pt restless, attempting to get out of bed, asking for wife.  Pt at risk for removing lines and foley.  Elink notified and posey belt ordered for pt safety.  Will continue to monitor.

## 2015-07-11 NOTE — Evaluation (Signed)
Physical Therapy Evaluation Patient Details Name: Benjamin Park MRN: [id number] DOB: 11-30-1940 Today's Date: 07/11/2015   History of Present Illness  Patient is a 75 yo male admitted 07/17/2015 with hypertensive encephalopathy. Intubated due to hypoxia. Extubated 07/09/15.    PMH:  TIA/CVA, SAH, MI, CAD, dysphagia, Parkinson's  Clinical Impression  Patient presents with problems listed below.  Will benefit from acute PT to maximize functional mobility prior to discharge.  Patient requiring assist for mobility, continued confusion, and decreased safety awareness.  Recommend SNF at d/c for continued PT.    Follow Up Recommendations SNF;Supervision/Assistance - 24 hour    Equipment Recommendations  Rolling walker with 5" wheels    Recommendations for Other Services       Precautions / Restrictions Precautions Precautions: Fall Precaution Comments: Waist restraint for patient safety Restrictions Weight Bearing Restrictions: No      Mobility  Bed Mobility Overal bed mobility: Needs Assistance Bed Mobility: Supine to Sit;Sit to Supine     Supine to sit: Min assist;+2 for safety/equipment Sit to supine: Min assist;+2 for safety/equipment   General bed mobility comments: Able to move to sitting EOB with min assist.  Patient with flexed posture and good balance in sitting.  BP in sitting 156/110.  Contacted RN.  Patient returned to supine with min assist.  Transfers                 General transfer comment: Deferred due to HTN.  Ambulation/Gait                Stairs            Wheelchair Mobility    Modified Rankin (Stroke Patients Only)       Balance Overall balance assessment: Needs assistance Sitting-balance support: No upper extremity supported;Feet supported Sitting balance-Leahy Scale: Good                                       Pertinent Vitals/Pain Pain Assessment: No/denies pain    Home Living Family/patient expects to  be discharged to:: Skilled nursing facility Living Arrangements: Spouse/significant other                    Prior Function Level of Independence: Independent with assistive device(s)         Comments: Amb with cane household distances, but uses rollator for community distances.  Has A on the stairs.     Hand Dominance   Dominant Hand: Right    Extremity/Trunk Assessment   Upper Extremity Assessment: Generalized weakness           Lower Extremity Assessment: Generalized weakness         Communication   Communication: HOH  Cognition Arousal/Alertness: Awake/alert Behavior During Therapy: Restless;Impulsive Overall Cognitive Status: No family/caregiver present to determine baseline cognitive functioning                      General Comments      Exercises        Assessment/Plan    PT Assessment Patient needs continued PT services  PT Diagnosis Difficulty walking;Generalized weakness;Altered mental status   PT Problem List Decreased strength;Decreased activity tolerance;Decreased balance;Decreased mobility;Decreased cognition;Decreased knowledge of use of DME;Decreased safety awareness;Cardiopulmonary status limiting activity  PT Treatment Interventions DME instruction;Gait training;Functional mobility training;Therapeutic activities;Therapeutic exercise;Balance training;Cognitive remediation;Patient/family education   PT Goals (Current goals can be found  in the Care Plan section) Acute Rehab PT Goals Patient Stated Goal: None stated PT Goal Formulation: With patient Time For Goal Achievement: 07/25/15 Potential to Achieve Goals: Good    Frequency Min 3X/week   Barriers to discharge        Co-evaluation               End of Session   Activity Tolerance: Treatment limited secondary to medical complications (Comment) (Increased BP) Patient left: in bed;with call bell/phone within reach;with bed alarm set;with restraints  reapplied Nurse Communication: Mobility status (Increase in BP to 156/110)         Time: [id number] PT Time Calculation (min) (ACUTE ONLY): 14 min   Charges:   PT Evaluation $PT Eval High Complexity: 1 Procedure     PT G CodesDespina Pole 07-16-15, 2:21 PM Carita Pian. Sanjuana Kava, [place] Pager [phone]

## 2015-07-11 NOTE — Progress Notes (Signed)
PROGRESS NOTE  Benjamin Park [id number] DOB: Jan 29, 1941 DOA: 07/09/2015 PCP: No primary care provider on file. Outpatient Specialists:    LOS: 4 days   Brief Narrative: 75 y.o. male with PMH as outlined below including uncontrolled hypertension for which he has had multiple admissions. He was brought to [facility] ED 04/19 evening by his wife who informed staff that he was having a stroke due to the fact that he was unable to follow commands and had gargled speech. He has hx of hemorrhagic stroke and apparently presented very similarly at the time. He was last seen normal around 1pm earlier that afternoon. Wife stated that he never had any focal deficits, only gargled speech and inability follow commands. In ED, he had elevated BP's into the low 200's. He was started on nicardipine and after only 3 - 5 minutes, BP dropped into low 100's per ED RN. He was evaluated by neurology and initial head CT was negative for acute infarct or hemorrhage. Per wife, pt's BP was up to 165 earlier in the day and she tried to give him his oral meds; however, he was unable to swallow them due to vomiting. He was initially being admitted by [facility]; however, while in ED, he had sudden change in mental status to the point where he became minimally responsive. He also had BP down to 98 systolic along with hypoxia. ABG revealed pO2 of 50 and repeat CXR showed new LLL infiltrate, likely due to aspiration PNA. Due to his rapid decline, he was intubated for airway protection and PCCM was called to admit him to the ICU for further evaluation and management.  Assessment & Plan: Active Problems:   Sleep apnea   S/P patent foramen ovale closure   Abnormal involuntary movement   Hypertensive emergency   Acute encephalopathy   Encephalopathy acute   Frequent falls   Vomiting   History of TIA (transient ischemic attack)   Hypokalemia   Aspiration pneumonia (HCC)   Hypertensive urgency, malignant   Aspiration pneumonia of  both lower lobes due to vomit (HCC)   Confusion    Acute encephalopathy likely hypertensive encephalopathy  - may be related to hypertensive emergency, has a history of same as documented in outpatient notes - MRI brain 4/20 without acute findings - mental status improving  HTN emergency - initially on cardene gtt, now back on home regimen  - on Labetalol 50 BID  Hypokalemia - replete and monitor  Hypothyroidism  - continue synthroid, TSH 0.8 07/08/15  Septic shock due to aspiration PNA - briefly on levophed - initially on Vanc/Zosyn, now Zosyn alone.  Acute hypoxic respiratory failure - due to aspiration PNA, AMS - requiring Vent on admission, extubated 4/21.  - continue Zosyn for now, transition to Augmentin in 1-2 days  Chronic dysphagia - dysphagia diet  Prior CVAs and history of right frontal subarachnoid hemorrhage - MRI this hospitalization negative    DVT prophylaxis: heparin Code Status: Full Family Communication: no family at bedside Disposition Plan: TBD Barriers for discharge: AMS, IV antibiotics  Consultants:   None   Procedures:   EEG 4/21  Antimicrobials:  Vancomycin 4/20 >> 4/22  Zosyn 4/20 >>   Subjective: - no complaints, smiling, states he feels a lot better - denies HA, chest pain, no nausea/vomiting  Objective: Filed Vitals:   07/10/15 1930 07/10/15 1955 07/10/15 2300 07/11/15 0438  BP: 149/105 176/115 141/92 157/96  Pulse: 95 96 84 87  Temp: 98.3 F (36.8 C)  98.4 F (  36.9 C) 98.1 F (36.7 C)  TempSrc: Oral  Oral Oral  Resp: _0 Height:    6' (1.829 m)  Weight:    72.9 kg (160 lb 11.5 oz)  SpO2: 92% 93% 95% 96%    Intake/Output Summary (Last 24 hours) at 07/11/15 0705 Last data filed at 07/11/15 0600  Gross per 24 hour  Intake   2398 ml  Output   3080 ml  Net   -682 ml   Filed Weights   07/09/15 0215 07/10/15 0500 07/11/15 0438  Weight: 74.7 kg (164 lb 10.9 oz) 71.8 kg (158 lb 4.6 oz) 72.9 kg (160 lb  11.5 oz)    Examination: Constitutional: NAD, pleasant  Filed Vitals:   07/10/15 1930 07/10/15 1955 07/10/15 2300 07/11/15 0438  BP: 149/105 176/115 141/92 157/96  Pulse: 95 96 84 87  Temp: 98.3 F (36.8 C)  98.4 F (36.9 C) 98.1 F (36.7 C)  TempSrc: Oral  Oral Oral  Resp: _1 Height:    6' (1.829 m)  Weight:    72.9 kg (160 lb 11.5 oz)  SpO2: 92% 93% 95% 96%   Eyes: PERRL ENMT: Mucous membranes are moist. Neck: normal, supple, no masses, no thyromegaly Respiratory: clear to auscultation bilaterally, no wheezing, no crackles. Cardiovascular: Regular rate and rhythm, no murmurs / rubs / gallops.  Abdomen: no tenderness, Bowel sounds positive.  Musculoskeletal: no clubbing / cyanosis Skin: no rashes, lesions, ulcers. No induration Neurologic: non focal     Data Reviewed: I have personally reviewed following labs and imaging studies  CBC:  Recent Labs Lab 06/28/2015 1843 06/28/2015 1852 07/08/15 0105 07/09/15 0505 07/11/15 0315  WBC 10.3  --  9.5 12.5* 8.8  NEUTROABS 9.6*  --   --   --   --   HGB 15.8 17.7* 14.4 12.1* 12.0*  HCT 46.5 52.0 43.3 36.0* 36.0*  MCV 94.9  --  95.6 94.0 95.5  PLT 190  --  178 172 035   Basic Metabolic Panel:  Recent Labs Lab 07/08/15 0105  07/08/15 2129 07/09/15 0505 07/09/15 1434 07/09/15 2017 07/10/15 0300 07/10/15 1341 07/11/15 0315  NA 137  --   --  134*  --  141 142  --  144  K 4.1  --   --  2.8*  --  3.0* 3.3*  --  2.8*  CL 101  --   --  101  --  107 107  --  107  CO2 24  --   --  23  --  26 27  --  27  GLUCOSE 125*  --   --  158*  --  135* 123*  --  97  BUN 23*  --   --  21*  --  13 7  --  7  CREATININE 1.13  --   --  0.85  --  0.90 0.96  --  0.96  CALCIUM 8.3*  --   --  7.7*  --  8.3* 8.4*  --  8.5*  MG 1.5*  < > 2.1 1.9 2.1  --  2.1 2.0  --   PHOS 2.0*  < > 2.3* 1.5* 2.5  --  1.1*  --  2.8  < > = values in this interval not displayed. GFR: Estimated Creatinine Clearance: 69.6 mL/min (by C-G formula based  on Cr of 0.96). Liver Function Tests:  Recent Labs Lab 07/04/2015 1843  AST 21  ALT 20  ALKPHOS  77  BILITOT 0.8  PROT 7.9  ALBUMIN 3.9   No results for input(s): LIPASE, AMYLASE in the last 168 hours. No results for input(s): AMMONIA in the last 168 hours. Coagulation Profile:  Recent Labs Lab 07/02/2015 1843  INR 1.15   Cardiac Enzymes:  Recent Labs Lab 07/08/15 0105  TROPONINI 0.05*   BNP (last 3 results) No results for input(s): PROBNP in the last 8760 hours. HbA1C: No results for input(s): HGBA1C in the last 72 hours. CBG:  Recent Labs Lab 07/10/15 0330 07/10/15 0746 07/10/15 1204 07/10/15 1602 07/10/15 2151  GLUCAP 110* 108* 125* 122* 100*   Lipid Profile: No results for input(s): CHOL, HDL, LDLCALC, TRIG, CHOLHDL, LDLDIRECT in the last 72 hours. Thyroid Function Tests: No results for input(s): TSH, T4TOTAL, FREET4, T3FREE, THYROIDAB in the last 72 hours. Anemia Panel: No results for input(s): VITAMINB12, FOLATE, FERRITIN, TIBC, IRON, RETICCTPCT in the last 72 hours. Urine analysis:    Component Value Date/Time   COLORURINE YELLOW 07/08/2015 0728   APPEARANCEUR CLOUDY* 07/08/2015 0728   LABSPEC 1.022 07/08/2015 0728   PHURINE 6.0 07/08/2015 0728   GLUCOSEU NEGATIVE 07/08/2015 0728   HGBUR LARGE* 07/08/2015 0728   BILIRUBINUR NEGATIVE 07/08/2015 0728   KETONESUR NEGATIVE 07/08/2015 0728   PROTEINUR 100* 07/08/2015 0728   UROBILINOGEN 0.2 08/14/2014 1200   NITRITE NEGATIVE 07/08/2015 0728   LEUKOCYTESUR NEGATIVE 07/08/2015 0728   Sepsis Labs: Invalid input(s): PROCALCITONIN, LACTICIDVEN  Recent Results (from the past 240 hour(s))  Blood culture (routine x 2)     Status: None (Preliminary result)   Collection Time: 07/03/2015  8:37 PM  Result Value Ref Range Status   Specimen Description BLOOD LEFT HAND  Final   Special Requests IN PEDIATRIC BOTTLE 1CC  Final   Culture NO GROWTH 3 DAYS  Final   Report Status PENDING  Incomplete  Blood culture  (routine x 2)     Status: None (Preliminary result)   Collection Time: 07/06/2015  8:45 PM  Result Value Ref Range Status   Specimen Description BLOOD RIGHT HAND  Final   Special Requests IN PEDIATRIC BOTTLE 1CC  Final   Culture NO GROWTH 3 DAYS  Final   Report Status PENDING  Incomplete  MRSA PCR Screening     Status: None   Collection Time: 07/08/15  2:36 AM  Result Value Ref Range Status   MRSA by PCR NEGATIVE NEGATIVE Final    Comment:        The GeneXpert MRSA Assay (FDA approved for NASAL specimens only), is one component of a comprehensive MRSA colonization surveillance program. It is not intended to diagnose MRSA infection nor to guide or monitor treatment for MRSA infections.   Culture, respiratory (NON-Expectorated)     Status: None (Preliminary result)   Collection Time: 07/08/15  2:42 AM  Result Value Ref Range Status   Specimen Description TRACHEAL ASPIRATE  Final   Special Requests Normal  Final   Gram Stain   Final    RARE WBC PRESENT, PREDOMINANTLY PMN RARE SQUAMOUS EPITHELIAL CELLS PRESENT FEW GRAM NEGATIVE RODS Performed at [facility]    Culture   Final    Culture reincubated for better growth Performed at [facility]    Report Status PENDING  Incomplete      Radiology Studies: No results found.   Scheduled Meds: . antiseptic oral rinse  7 mL Mouth Rinse QID  . aspirin  325 mg Per Tube Daily  . chlorhexidine gluconate (SAGE KIT)  15 mL Mouth Rinse BID  . heparin  5,000 Units Subcutaneous Q8H  . insulin aspart  0-9 Units Subcutaneous TID WC  . labetalol  50 mg Oral BID  . levothyroxine  112 mcg Oral QAC breakfast  . piperacillin-tazobactam  3.375 g Intravenous Once  . piperacillin-tazobactam (ZOSYN)  IV  3.375 g Intravenous Q8H  . potassium chloride  40 mEq Oral Q3H  . pravastatin  20 mg Per Tube q1800   Continuous Infusions: . dextrose 5 % and 0.9% NaCl 50 mL/hr at 07/10/15 [place], MD, PhD [facility] Pager [phone]  If 7PM-7AM, please contact night-coverage [email or web address] Password [facility] 07/11/2015, 7:05 AM

## 2015-07-11 NOTE — Progress Notes (Signed)
[place] Progress Note Patient Name: Benjamin Park DOB: [birth year] MRN: [id number]   Date of Service  07/11/2015  HPI/Events of Note  Patient restless - attempting to get OOB - concern for patient safety.  eICU Interventions  Order for posey belt as needed for patient safety     Intervention Category Intermediate Interventions: Other:  Izabella Marcantel 07/11/2015, 12:33 AM

## 2015-07-11 NOTE — Progress Notes (Signed)
Pharmacy Antibiotic Note  Benjamin Park is a 75 y.o. male admitted on 07/09/2015 with sign and symptoms of a stroke.  Pharmacy has been consulted for zosyn dosing for aspiration pneumonia.  Plan: - Continue Zosyn 3.375 gm IV q8h (4-hr infusion) - F/u cultures - GNR in respiratory culture - Narrow as able  Height: 6' (182.9 cm) Weight: 160 lb 11.5 oz (72.9 kg) IBW/kg (Calculated) : 77.6  Temp (24hrs), Avg:98.3 F (36.8 C), Min:97.9 F (36.6 C), Max:98.8 F (37.1 C)   Recent Labs Lab 07/03/2015 1843  07/08/15 0105 07/08/15 0120 07/09/15 0505 07/09/15 2017 07/10/15 0300 07/11/15 0315  WBC 10.3  --  9.5  --  12.5*  --   --  8.8  CREATININE 1.09  < > 1.13  --  0.85 0.90 0.96 0.96  LATICACIDVEN  --   --  2.0 1.90  --   --   --   --   < > = values in this interval not displayed.  Estimated Creatinine Clearance: 69.6 mL/min (by C-G formula based on Cr of 0.96).    No Known Allergies  Antimicrobials this admission: Zosyn 4/20 >> Vanc 4/20 >> 4/21  Dose adjustments this admission: none  Microbiology results: 4/19 BCx: ngtd 4/20 Sputum: GNR  4/20 MRSA PCR: negative  Thank you for allowing pharmacy to be a part of this patient's care.  Cassie L. Nicole Kindred, PharmD PGY2 Infectious Diseases Pharmacy Resident Pager: [phone] 07/11/2015 1:25 PM

## 2015-07-12 ENCOUNTER — Inpatient Hospital Stay (HOSPITAL_COMMUNITY): Payer: Medicare HMO

## 2015-07-12 DIAGNOSIS — Z8673 Personal history of transient ischemic attack (TIA), and cerebral infarction without residual deficits: Secondary | ICD-10-CM

## 2015-07-12 DIAGNOSIS — I160 Hypertensive urgency: Secondary | ICD-10-CM

## 2015-07-12 LAB — CBC
HEMATOCRIT: 34.8 % — AB (ref 39.0–52.0)
HEMOGLOBIN: 11.4 g/dL — AB (ref 13.0–17.0)
MCH: 31.6 pg (ref 26.0–34.0)
MCHC: 32.8 g/dL (ref 30.0–36.0)
MCV: 96.4 fL (ref 78.0–100.0)
Platelets: 158 10*3/uL (ref 150–400)
RBC: 3.61 MIL/uL — AB (ref 4.22–5.81)
RDW: 13 % (ref 11.5–15.5)
WBC: 5.9 10*3/uL (ref 4.0–10.5)

## 2015-07-12 LAB — CULTURE, BLOOD (ROUTINE X 2)
CULTURE: NO GROWTH
CULTURE: NO GROWTH

## 2015-07-12 LAB — BASIC METABOLIC PANEL
ANION GAP: 11 (ref 5–15)
BUN: 13 mg/dL (ref 6–20)
CHLORIDE: 107 mmol/L (ref 101–111)
CO2: 25 mmol/L (ref 22–32)
Calcium: 8.3 mg/dL — ABNORMAL LOW (ref 8.9–10.3)
Creatinine, Ser: 0.99 mg/dL (ref 0.61–1.24)
GFR calc non Af Amer: >60 mL/min (ref >60)
GLUCOSE: 98 mg/dL (ref 65–99)
POTASSIUM: 2.9 mmol/L — AB (ref 3.5–5.1)
Sodium: 143 mmol/L (ref 135–145)

## 2015-07-12 LAB — CULTURE, RESPIRATORY W GRAM STAIN: Special Requests: NORMAL

## 2015-07-12 LAB — GLUCOSE, CAPILLARY
GLUCOSE-CAPILLARY: 94 mg/dL (ref 65–99)
GLUCOSE-CAPILLARY: 105 mg/dL — AB (ref 65–99)
GLUCOSE-CAPILLARY: 118 mg/dL — AB (ref 65–99)
Glucose-Capillary: 94 mg/dL (ref 65–99)

## 2015-07-12 LAB — PHOSPHORUS: PHOSPHORUS: 2.7 mg/dL (ref 2.5–4.6)

## 2015-07-12 MED ORDER — LABETALOL HCL 100 MG PO TABS
100.0000 mg | ORAL_TABLET | Freq: Two times a day (BID) | ORAL | Status: DC
  Administered 2015-07-12 – 2015-07-13 (×2): 100 mg via ORAL
  Filled 2015-07-12 (×3): qty 1

## 2015-07-12 MED ORDER — POTASSIUM CHLORIDE 20 MEQ PO PACK
40.0000 meq | PACK | ORAL | Status: DC
  Filled 2015-07-12: qty 2

## 2015-07-12 MED ORDER — POTASSIUM CHLORIDE 20 MEQ PO PACK
40.0000 meq | PACK | ORAL | Status: AC
  Administered 2015-07-12 (×2): 40 meq via ORAL
  Filled 2015-07-12 (×2): qty 2

## 2015-07-12 MED ORDER — POTASSIUM CHLORIDE 20 MEQ/15ML (10%) PO SOLN
40.0000 meq | Freq: Once | ORAL | Status: AC
  Administered 2015-07-12: 40 meq via ORAL
  Filled 2015-07-12: qty 30

## 2015-07-12 NOTE — Progress Notes (Signed)
Speech Language Pathology Treatment: Dysphagia  Patient Details Name: Benjamin Park MRN: [id number] DOB: 06/20/1940 Today's Date: 07/12/2015 Time: 1202-1218 SLP Time Calculation (min) (ACUTE ONLY): 16 min  Assessment / Plan / Recommendation Clinical Impression  Pt has immediate and delayed coughing with nectar thick liquids and bites of puree. He has a h/o aspiration, including silent aspiration, with anything thinner than pudding thick liquids, and he is currently at a heightened risk given intubation and AMS. Recommend to hold POs until MBS can be completed this afternoon.   HPI HPI: 75 y.o. male with PMH as outlined below including uncontrolled hypertension for which he has had multiple admissions. He was brought to [facility] ED 04/19 evening by his wife who informed staff that he was having a stroke due to the fact that he was unable to follow commands and had gargled speech. He has hx of hemorrhagic stroke and apparently presented very similarly at the time. He was last seen normal around 1pm earlier that afternoon. Wife stated that he never had any focal deficits, only gargled speech and inability follow commands.       SLP Plan  MBS     Recommendations  Diet recommendations: NPO Medication Administration: Via alternative means             Oral Care Recommendations: Oral care QID Follow up Recommendations:  (tba) Plan: MBS     GO               [name], M.A. CCC-SLP [phone]  [name] 07/12/2015, 12:27 PM

## 2015-07-12 NOTE — Progress Notes (Signed)
PROGRESS NOTE  Benjamin Park KYH:[id number] DOB: 1940/05/25 DOA: 06/25/2015 PCP: No primary care provider on file. Outpatient Specialists:    LOS: 5 days   Brief Narrative: 76 y.o. male with PMH as outlined below including uncontrolled hypertension for which he has had multiple admissions. He was brought to [facility] ED 04/19 evening by his wife who informed staff that he was having a stroke due to the fact that he was unable to follow commands and had gargled speech. He has hx of hemorrhagic stroke and apparently presented very similarly at the time. He was last seen normal around 1pm earlier that afternoon. Wife stated that he never had any focal deficits, only gargled speech and inability follow commands. In ED, he had elevated BP's into the low 200's. He was started on nicardipine and after only 3 - 5 minutes, BP dropped into low 100's per ED RN. He was evaluated by neurology and initial head CT was negative for acute infarct or hemorrhage. Per wife, pt's BP was up to 165 earlier in the day and she tried to give him his oral meds; however, he was unable to swallow them due to vomiting. He was initially being admitted by [facility]; however, while in ED, he had sudden change in mental status to the point where he became minimally responsive. He also had BP down to 98 systolic along with hypoxia. ABG revealed pO2 of 50 and repeat CXR showed new LLL infiltrate, likely due to aspiration PNA. Due to his rapid decline, he was intubated for airway protection and PCCM was called to admit him to the ICU for further evaluation and management.  Assessment & Plan: Active Problems:   Sleep apnea   S/P patent foramen ovale closure   Abnormal involuntary movement   Hypertensive emergency   Acute encephalopathy   Encephalopathy acute   Frequent falls   Vomiting   History of TIA (transient ischemic attack)   Hypokalemia   Aspiration pneumonia (HCC)   Hypertensive urgency, malignant   Aspiration pneumonia of  both lower lobes due to vomit (HCC)   Confusion   Acute encephalopathy likely hypertensive encephalopathy  - may be related to hypertensive emergency, has a history of same as documented in outpatient notes - MRI brain 4/20 without acute findings - mental status improving, axox3 this morning, pleasant  HTN emergency - initially on cardene gtt, now back on home regimen  - on Labetalol 50 BID >> change to home regimen 100 BID today  - BP 134/92 this morning  Hypokalemia - replete and monitor, again 2.9 this morning - monitor  Hypothyroidism  - continue synthroid, TSH 0.8 07/08/15  Septic shock due to aspiration PNA - briefly on levophed - initially on Vanc/Zosyn, now Zosyn alone.  Acute hypoxic respiratory failure - due to aspiration PNA, AMS - requiring Vent on admission, extubated 4/21.  - continue Zosyn for now, transition to Augmentin tomorrow for total 7 days  Chronic dysphagia - due to prior CVA - dysphagia diet  Prior CVAs and history of right frontal subarachnoid hemorrhage - MRI this hospitalization negative   DVT prophylaxis: heparin Code Status: Full Family Communication: no family at bedside, d/w wife over the phone  Disposition Plan: TBD Barriers for discharge: AMS, IV antibiotics   Consultants:   None   Procedures:   EEG 4/21  Antimicrobials:  Vancomycin 4/20 >> 4/22  Zosyn 4/20 >>   Subjective: - no complaints this morning, denies chest pain / dyspnea / palpitations  Objective: Filed Vitals:  07/12/15 0800 07/12/15 0804 07/12/15 0807 07/12/15 1039  BP:   134/92   Pulse: 69  74   Temp:  97.2 F (36.2 C)    TempSrc:  Oral    Resp: 20  21   Height:    6' (1.829 m)  Weight:    71.169 kg (156 lb 14.4 oz)  SpO2: 93%  96%     Intake/Output Summary (Last 24 hours) at 07/12/15 1050 Last data filed at 07/12/15 0930  Gross per 24 hour  Intake   1400 ml  Output   1776 ml  Net   -376 ml   Filed Weights   07/11/15 0438 07/12/15 0350  07/12/15 1039  Weight: 72.9 kg (160 lb 11.5 oz) 72.899 kg (160 lb 11.4 oz) 71.169 kg (156 lb 14.4 oz)    Examination: Constitutional: NAD Filed Vitals:   07/12/15 0800 07/12/15 0804 07/12/15 0807 07/12/15 1039  BP:   134/92   Pulse: 69  74   Temp:  97.2 F (36.2 C)    TempSrc:  Oral    Resp: 20  21   Height:    6' (1.829 m)  Weight:    71.169 kg (156 lb 14.4 oz)  SpO2: 93%  96%    ENMT: Mucous membranes are moist. Respiratory: clear to auscultation bilaterally, no wheezing, no crackles. Cardiovascular: Regular rate and rhythm, no murmurs / rubs / gallops.  Abdomen: no tenderness, Bowel sounds positive.  Musculoskeletal: no clubbing / cyanosis Neurologic: non focal    Data Reviewed: I have personally reviewed following labs and imaging studies  CBC:  Recent Labs Lab 06/21/2015 1843 06/19/2015 1852 07/08/15 0105 07/09/15 0505 07/11/15 0315 07/12/15 0400  WBC 10.3  --  9.5 12.5* 8.8 5.9  NEUTROABS 9.6*  --   --   --   --   --   HGB 15.8 17.7* 14.4 12.1* 12.0* 11.4*  HCT 46.5 52.0 43.3 36.0* 36.0* 34.8*  MCV 94.9  --  95.6 94.0 95.5 96.4  PLT 190  --  178 172 153 158   Basic Metabolic Panel:  Recent Labs Lab 07/08/15 2129 07/09/15 0505 07/09/15 1434 07/09/15 2017 07/10/15 0300 07/10/15 1341 07/11/15 0315 07/12/15 0400  NA  --  134*  --  141 142  --  144 143  K  --  2.8*  --  3.0* 3.3*  --  2.8* 2.9*  CL  --  101  --  107 107  --  107 107  CO2  --  23  --  26 27  --  27 25  GLUCOSE  --  158*  --  135* 123*  --  97 98  BUN  --  21*  --  13 7  --  7 13  CREATININE  --  0.85  --  0.90 0.96  --  0.96 0.99  CALCIUM  --  7.7*  --  8.3* 8.4*  --  8.5* 8.3*  MG 2.1 1.9 2.1  --  2.1 2.0  --   --   PHOS 2.3* 1.5* 2.5  --  1.1*  --  2.8 2.7   Liver Function Tests:  Recent Labs Lab 07/13/2015 1843  AST 21  ALT 20  ALKPHOS 77  BILITOT 0.8  PROT 7.9  ALBUMIN 3.9   Coagulation Profile:  Recent Labs Lab 07/11/2015 1843  INR 1.15   Cardiac Enzymes:  Recent  Labs Lab 07/08/15 0105  TROPONINI 0.05*   CBG:  Recent Labs Lab  07/11/15 0745 07/11/15 1144 07/11/15 1639 07/11/15 2114 07/12/15 0806  GLUCAP 99 129* 91 115* 94   Lipid Profile: No results for input(s): CHOL, HDL, LDLCALC, TRIG, CHOLHDL, LDLDIRECT in the last 72 hours. Thyroid Function Tests: No results for input(s): TSH, T4TOTAL, FREET4, T3FREE, THYROIDAB in the last 72 hours. Anemia Panel: No results for input(s): VITAMINB12, FOLATE, FERRITIN, TIBC, IRON, RETICCTPCT in the last 72 hours. Urine analysis:    Component Value Date/Time   COLORURINE YELLOW 07/08/2015 0728   APPEARANCEUR CLOUDY* 07/08/2015 0728   LABSPEC 1.022 07/08/2015 0728   PHURINE 6.0 07/08/2015 0728   GLUCOSEU NEGATIVE 07/08/2015 0728   HGBUR LARGE* 07/08/2015 0728   BILIRUBINUR NEGATIVE 07/08/2015 0728   KETONESUR NEGATIVE 07/08/2015 0728   PROTEINUR 100* 07/08/2015 0728   UROBILINOGEN 0.2 08/14/2014 1200   NITRITE NEGATIVE 07/08/2015 0728   LEUKOCYTESUR NEGATIVE 07/08/2015 0728   Sepsis Labs: Invalid input(s): PROCALCITONIN, LACTICIDVEN  Recent Results (from the past 240 hour(s))  Blood culture (routine x 2)     Status: None (Preliminary result)   Collection Time: 07/08/2015  8:37 PM  Result Value Ref Range Status   Specimen Description BLOOD LEFT HAND  Final   Special Requests IN PEDIATRIC BOTTLE 1CC  Final   Culture NO GROWTH 4 DAYS  Final   Report Status PENDING  Incomplete  Blood culture (routine x 2)     Status: None (Preliminary result)   Collection Time: 07/12/2015  8:45 PM  Result Value Ref Range Status   Specimen Description BLOOD RIGHT HAND  Final   Special Requests IN PEDIATRIC BOTTLE 1CC  Final   Culture NO GROWTH 4 DAYS  Final   Report Status PENDING  Incomplete  MRSA PCR Screening     Status: None   Collection Time: 07/08/15  2:36 AM  Result Value Ref Range Status   MRSA by PCR NEGATIVE NEGATIVE Final    Comment:        The GeneXpert MRSA Assay (FDA approved for NASAL  specimens only), is one component of a comprehensive MRSA colonization surveillance program. It is not intended to diagnose MRSA infection nor to guide or monitor treatment for MRSA infections.   Culture, respiratory (NON-Expectorated)     Status: None (Preliminary result)   Collection Time: 07/08/15  2:42 AM  Result Value Ref Range Status   Specimen Description TRACHEAL ASPIRATE  Final   Special Requests Normal  Final   Gram Stain   Final    RARE WBC PRESENT, PREDOMINANTLY PMN RARE SQUAMOUS EPITHELIAL CELLS PRESENT FEW GRAM NEGATIVE RODS Performed at [facility]    Culture   Final    MODERATE GRAM NEGATIVE RODS Performed at [facility]    Report Status PENDING  Incomplete    Radiology Studies: Dg Chest Port 1 View  07/11/2015  CLINICAL DATA:  Hypertension, coronary artery disease EXAM: PORTABLE CHEST 1 VIEW COMPARISON:  07/09/2015 FINDINGS: Interval extubation. RIGHT central venous line remains. Normal cardiac silhouette. There is LEFT lower lobe airspace disease not changed. No pneumothorax. IMPRESSION: 1. Interval extubation without complication. 2. Persistent LEFT lower lobe airspace opacity. Electronically Signed   By: Suzy Bouchard M.D.   On: 07/11/2015 09:04   Scheduled Meds: . antiseptic oral rinse  7 mL Mouth Rinse QID  . aspirin  325 mg Per Tube Daily  . chlorhexidine gluconate (SAGE KIT)  15 mL Mouth Rinse BID  . heparin  5,000 Units Subcutaneous Q8H  . insulin aspart  0-9 Units Subcutaneous TID  WC  . labetalol  50 mg Oral BID  . levothyroxine  112 mcg Oral QAC breakfast  . piperacillin-tazobactam  3.375 g Intravenous Once  . piperacillin-tazobactam (ZOSYN)  IV  3.375 g Intravenous Q8H  . potassium chloride  40 mEq Oral Q3H  . pravastatin  20 mg Per Tube q1800   Continuous Infusions: . dextrose 5 % and 0.9% NaCl 50 mL/hr at 07/12/15 0700   Marzetta Board, MD, PhD [facility] Pager [phone]  If 7PM-7AM, please contact  night-coverage [email or web address] Password [email or web address] 07/12/2015, 10:50 AM

## 2015-07-12 NOTE — Progress Notes (Signed)
NURSING PROGRESS NOTE  Benjamin Park [id number] Transfer Data: 07/12/2015 8:26 PM Attending Provider: Caren Griffins, MD PCP:No primary care provider on file. Code Status: Full   Benjamin Park is a 75 y.o. male patient transferred from [address]  -No acute distress noted.  -No complaints of shortness of breath.  -No complaints of chest pain.   Cardiac Monitoring: Box # 16 in place. Cardiac monitor yields:normal sinus rhythm.  Last Documented Vital Signs: Blood pressure 161/102, pulse 78, temperature 98.2 F (36.8 C), temperature source Oral, resp. rate 19, height 6' (1.829 m), weight 71.169 kg (156 lb 14.4 oz), SpO2 99 %.  IV Fluids:  IV in place, occlusive dsg intact without redness, IV cath in R [facility] & FA & R IJ D5W/0.9 NaCl.   Allergies:  Review of patient's allergies indicates no known allergies.  Past Medical History:   has a past medical history of transient ischemic attack (TIA); Hyperlipidemia; ED (erectile dysfunction); Personal history of kidney stones; Subarachnoid hemorrhage ([place]); Myocardial infarction ([place]); Dysphagia; Hypokalemia; Hypothyroidism; Sleep apnea; History of kidney stones (02-27-13); Throat cancer ([place]); Stroke [facility]); Unspecified cerebral artery occlusion with cerebral infarction (11/08/2012); Episodic confusion (02-27-13); AKI (acute kidney injury) ([place]) (02/23/2011); Hypertension; Hypertensive encephalopathy; Headache(784.0); Neuromuscular disorder ([place]); and Coronary artery disease.  Past Surgical History:   has past surgical history that includes Angioplasty (1993); Cardiac catheterization; Coronary angioplasty; basal cell cancer; Hernia repair; Appendectomy; Patent foramen ovale closure (2008); Eye surgery; Colonoscopy (N/A, 06/18/2012); Hot hemostasis (N/A, 06/18/2012); and Colonoscopy with propofol (N/A, 03/07/2013).  Social History:   reports that he quit smoking about 24 years ago. His smoking use included Cigarettes. He has never used smokeless tobacco. He reports  that he does not drink alcohol or use illicit drugs.  Skin: intact except as otherwise charted   Patient/Family orientated to room. Information packet given to patient/family. Admission inpatient armband information verified with patient/family to include name and date of birth and placed on patient arm. Side rails up x 2, fall assessment and education completed with patient/family. Patient/family able to verbalize understanding of risk associated with falls and verbalized understanding to call for assistance before getting out of bed. Call light within reach. Patient/family able to voice and demonstrate understanding of unit orientation instructions.

## 2015-07-12 NOTE — Progress Notes (Signed)
Nutrition Follow-up  DOCUMENTATION CODES:   Non-severe (moderate) malnutrition in context of chronic illness  INTERVENTION:   -Magic Cup TID with meals  NUTRITION DIAGNOSIS:   Malnutrition related to chronic illness as evidenced by mild depletion of body fat, moderate depletion of body fat, moderate depletions of muscle mass, severe depletion of muscle mass, mild depletion of muscle mass.  Ongoing  GOAL:   Patient will meet greater than or equal to 90% of their needs  Progressing  MONITOR:   PO intake, Supplement acceptance, Diet advancement, Labs, Weight trends, Skin, I & O's  REASON FOR ASSESSMENT:   Consult Enteral/tube feeding initiation and management  ASSESSMENT:   75 y.o. male with PMH as outlined below including uncontrolled hypertension for which he has had multiple admissions. He was brought to [facility] ED 04/19 evening by his wife with AMS, garbled speech, vomiting. Found to have new LLL infiltrates, likely due to aspiration PNA.   Pt extubated on 07/09/15. Pt transferred from ICU to medical floor on 07/12/15.   Pt was being transported to radiology at time of visit. Pt underwent BSE this morning and recommend dysphagia 2 diet with nectar thick liquids. Pt then underwent MBSS, which recommend dysphagia 2 diet with pudding thick liquids. Meal completion 85%. Pt at risk for poor po intake due to dysphagia diet restrictions. RD will add Magic Cup to optimize intake.  Labs reviewed: K: 2.9 (on IV supplementation), CBGS: 94-115.   Diet Order:  DIET DYS 2 Room service appropriate?: Yes; Fluid consistency:: Pudding Thick  Skin:  Reviewed, no issues  Last BM:  07/11/15  Height:   Ht Readings from Last 1 Encounters:  07/12/15 6' (1.829 m)    Weight:   Wt Readings from Last 1 Encounters:  07/12/15 156 lb 14.4 oz (71.169 kg)    Ideal Body Weight:  80.9 kg  BMI:  Body mass index is 21.27 kg/(m^2).  Estimated Nutritional Needs:   Kcal:  1600-1800  Protein:   80-95 grams  Fluid:  1.6-1.8 L  EDUCATION NEEDS:   No education needs identified at this time  Elyza Whitt A. Jimmye Norman, RD, LDN, CDE Pager: [phone] After hours Pager: [phone]

## 2015-07-12 NOTE — Progress Notes (Signed)
CSW spoke with pt wife concerning plan for pt.  Pt wife states that they tried SNF in the past and that pt left after 15 hours- pt wife would like to discuss with pt now that his mental status is clearing some and will discuss with CSW after- wife plans to visit pt this afternoon  CSW will continue to follow and discuss placement options with wife further if they decide he is needing placement.  Domenica Reamer, [place] Social Worker [phone]

## 2015-07-12 NOTE — Care Management Note (Addendum)
Case Management Note  Patient Details  Name: Benjamin Park MRN: [id number] Date of Birth: 1941/02/24  Subjective/Objective:  Patient is from home with spouse, presented with htn crisis, ? CVA,intubated, extubated, vomited ? Asp pn.  Per pt eval rec snf.  CSW referral.                  Action/Plan:   Expected Discharge Date:                  Expected Discharge Plan:  Skilled Nursing Facility  In-House Referral:  Clinical Social Work  Discharge planning Services  CM Consult  Post Acute Care Choice:    Choice offered to:     DME Arranged:    DME Agency:     HH Arranged:    [place] Agency:     Status of Service:  Completed, signed off  Medicare Important Message Given:  Other (see comment) Date Medicare IM Given:    Medicare IM give by:    Date Additional Medicare IM Given:    Additional Medicare Important Message give by:     If discussed at [place] of Stay Meetings, dates discussed:    Additional Comments:  Zenon Mayo, RN 07/12/2015, 11:01 AM

## 2015-07-12 NOTE — Progress Notes (Signed)
MBSS complete. Full report located under chart review in imaging section.  Analise Glotfelty Paiewonsky, M.A. CCC-SLP [phone]  

## 2015-07-13 ENCOUNTER — Inpatient Hospital Stay (HOSPITAL_COMMUNITY): Payer: Medicare HMO

## 2015-07-13 DIAGNOSIS — J9601 Acute respiratory failure with hypoxia: Secondary | ICD-10-CM | POA: Insufficient documentation

## 2015-07-13 LAB — BLOOD GAS, ARTERIAL
ACID-BASE EXCESS: 2.2 mmol/L — AB (ref 0.0–2.0)
Acid-Base Excess: 2.3 mmol/L — ABNORMAL HIGH (ref 0.0–2.0)
BICARBONATE: 25.4 meq/L — AB (ref 20.0–24.0)
Bicarbonate: 25.8 mEq/L — ABNORMAL HIGH (ref 20.0–24.0)
DRAWN BY: 331761
Drawn by: 313941
FIO2: 1
LHR: 14 {breaths}/min
MECHVT: 530 mL
O2 Content: 3 L/min
O2 Saturation: 89.2 %
O2 Saturation: 99.6 %
PCO2 ART: 33.1 mmHg — AB (ref 35.0–45.0)
PEEP/CPAP: 5 cmH2O
PH ART: 7.496 — AB (ref 7.350–7.450)
PO2 ART: 54 mmHg — AB (ref 80.0–100.0)
Patient temperature: 98.6
Patient temperature: 98.6
TCO2: 26.4 mmol/L (ref 0–100)
TCO2: 27 mmol/L (ref 0–100)
pCO2 arterial: 37 mmHg (ref 35.0–45.0)
pH, Arterial: 7.458 — ABNORMAL HIGH (ref 7.350–7.450)
pO2, Arterial: 287 mmHg — ABNORMAL HIGH (ref 80.0–100.0)

## 2015-07-13 LAB — BASIC METABOLIC PANEL
Anion gap: 9 (ref 5–15)
BUN: 10 mg/dL (ref 6–20)
CHLORIDE: 107 mmol/L (ref 101–111)
CO2: 25 mmol/L (ref 22–32)
CREATININE: 1.05 mg/dL (ref 0.61–1.24)
Calcium: 8.5 mg/dL — ABNORMAL LOW (ref 8.9–10.3)
GFR calc Af Amer: >60 mL/min (ref >60)
GFR calc non Af Amer: >60 mL/min (ref >60)
GLUCOSE: 100 mg/dL — AB (ref 65–99)
POTASSIUM: 3.4 mmol/L — AB (ref 3.5–5.1)
SODIUM: 141 mmol/L (ref 135–145)

## 2015-07-13 LAB — PHOSPHORUS: Phosphorus: 2.9 mg/dL (ref 2.5–4.6)

## 2015-07-13 LAB — GLUCOSE, CAPILLARY
GLUCOSE-CAPILLARY: 94 mg/dL (ref 65–99)
GLUCOSE-CAPILLARY: 85 mg/dL (ref 65–99)
GLUCOSE-CAPILLARY: 117 mg/dL — AB (ref 65–99)
Glucose-Capillary: 107 mg/dL — ABNORMAL HIGH (ref 65–99)

## 2015-07-13 MED ORDER — IPRATROPIUM-ALBUTEROL 0.5-2.5 (3) MG/3ML IN SOLN
3.0000 mL | Freq: Once | RESPIRATORY_TRACT | Status: AC
  Administered 2015-07-13: 3 mL via RESPIRATORY_TRACT
  Filled 2015-07-13: qty 3

## 2015-07-13 MED ORDER — VANCOMYCIN HCL IN DEXTROSE 1-5 GM/200ML-% IV SOLN
1000.0000 mg | Freq: Two times a day (BID) | INTRAVENOUS | Status: DC
  Administered 2015-07-13 – 2015-07-15 (×4): 1000 mg via INTRAVENOUS
  Filled 2015-07-13 (×5): qty 200

## 2015-07-13 MED ORDER — FENTANYL CITRATE (PF) 100 MCG/2ML IJ SOLN
25.0000 ug | INTRAMUSCULAR | Status: DC | PRN
  Administered 2015-07-21 – 2015-07-22 (×2): 25 ug via INTRAVENOUS
  Filled 2015-07-13 (×3): qty 2

## 2015-07-13 MED ORDER — MIDAZOLAM HCL 2 MG/2ML IJ SOLN
INTRAMUSCULAR | Status: AC
  Filled 2015-07-13: qty 4

## 2015-07-13 MED ORDER — CLONIDINE HCL 0.1 MG PO TABS
0.1000 mg | ORAL_TABLET | Freq: Once | ORAL | Status: DC
  Filled 2015-07-13: qty 1

## 2015-07-13 MED ORDER — FUROSEMIDE 10 MG/ML IJ SOLN
40.0000 mg | Freq: Once | INTRAMUSCULAR | Status: AC
  Administered 2015-07-13: 40 mg via INTRAVENOUS
  Filled 2015-07-13: qty 4

## 2015-07-13 MED ORDER — MIDAZOLAM HCL 2 MG/2ML IJ SOLN
2.0000 mg | Freq: Once | INTRAMUSCULAR | Status: AC
  Administered 2015-07-13: 2 mg via INTRAVENOUS

## 2015-07-13 MED ORDER — PANTOPRAZOLE SODIUM 40 MG IV SOLR
40.0000 mg | INTRAVENOUS | Status: DC
  Administered 2015-07-13 – 2015-07-15 (×3): 40 mg via INTRAVENOUS
  Filled 2015-07-13 (×3): qty 40

## 2015-07-13 MED ORDER — ETOMIDATE 2 MG/ML IV SOLN
50.0000 mg | Freq: Once | INTRAVENOUS | Status: AC
  Administered 2015-07-13: 50 mg via INTRAVENOUS

## 2015-07-13 MED ORDER — ROCURONIUM BROMIDE 50 MG/5ML IV SOLN
20.0000 mg | Freq: Once | INTRAVENOUS | Status: AC
  Administered 2015-07-13: 20 mg via INTRAVENOUS
  Filled 2015-07-13: qty 2

## 2015-07-13 MED ORDER — METOPROLOL TARTRATE 5 MG/5ML IV SOLN
2.5000 mg | INTRAVENOUS | Status: DC | PRN
  Administered 2015-07-13: 5 mg via INTRAVENOUS
  Filled 2015-07-13: qty 5

## 2015-07-13 MED ORDER — PHENYLEPHRINE HCL 10 MG/ML IJ SOLN
0.0000 ug/min | INTRAMUSCULAR | Status: DC
  Filled 2015-07-13: qty 1

## 2015-07-13 MED ORDER — HYDRALAZINE HCL 20 MG/ML IJ SOLN: 10.0000 mg | INTRAMUSCULAR | Status: DC | PRN

## 2015-07-13 MED ORDER — FENTANYL CITRATE (PF) 100 MCG/2ML IJ SOLN
200.0000 ug | Freq: Once | INTRAMUSCULAR | Status: AC
  Administered 2015-07-13: 200 ug via INTRAVENOUS

## 2015-07-13 MED ORDER — PROPOFOL 1000 MG/100ML IV EMUL
5.0000 ug/kg/min | INTRAVENOUS | Status: DC
  Administered 2015-07-13: 10 ug/kg/min via INTRAVENOUS
  Administered 2015-07-14: 5 ug/kg/min via INTRAVENOUS
  Filled 2015-07-13 (×3): qty 100

## 2015-07-13 MED ORDER — LACTATED RINGERS IV SOLN
INTRAVENOUS | Status: DC
  Administered 2015-07-13 – 2015-07-16 (×3): via INTRAVENOUS

## 2015-07-13 MED ORDER — LEVOTHYROXINE SODIUM 112 MCG PO TABS
112.0000 ug | ORAL_TABLET | Freq: Every day | ORAL | Status: DC
  Administered 2015-07-14 – 2015-07-20 (×7): 112 ug
  Filled 2015-07-13 (×7): qty 1

## 2015-07-13 MED ORDER — FENTANYL CITRATE (PF) 100 MCG/2ML IJ SOLN
INTRAMUSCULAR | Status: AC
  Filled 2015-07-13: qty 4

## 2015-07-13 NOTE — Progress Notes (Signed)
Physical Therapy Treatment Patient Details Name: Benjamin Park MRN: [id number] DOB: May 18, 1940 Today's Date: 07/13/2015    History of Present Illness Patient is a 75 yo male admitted 06/29/2015 with hypertensive encephalopathy. Intubated due to hypoxia. Extubated 07/09/15.    PMH:  TIA/CVA, SAH, MI, CAD, dysphagia, Parkinson's    PT Comments    Patient able to ambulate this session and BP lower afterwards than prior.  Feel safe enough for d/c home with wife who is used to giving him physical assist with HHPT.  Will continue skilled PT in the acute setting until d/c.  Follow Up Recommendations  Home health PT;Supervision/Assistance - 24 hour     Equipment Recommendations  None recommended by PT    Recommendations for Other Services       Precautions / Restrictions Precautions Precautions: Fall    Mobility  Bed Mobility Overal bed mobility: Needs Assistance Bed Mobility: Supine to Sit     Supine to sit: Min assist     General bed mobility comments: increased time required, cues fo scoot to edge of bed; BP 132/102 sittingw  Transfers Overall transfer level: Needs assistance Equipment used: Rolling walker (2 wheeled) Transfers: Sit to/from Stand Sit to Stand: Min assist         General transfer comment: up from lower bed with cues for hand placement  Ambulation/Gait Ambulation/Gait assistance: Mod assist Ambulation Distance (Feet): 150 Feet Assistive device: Rolling walker (2 wheeled) Gait Pattern/deviations: Scissoring;Drifts right/left;Trunk flexed Gait velocity: increasing speed, assist and cues for safety; in small space in room turning to sit in chair mod A for walker management and cues for technique       Stairs            Wheelchair Mobility    Modified Rankin (Stroke Patients Only)       Balance Overall balance assessment: Needs assistance   Sitting balance-Leahy Scale: Good     Standing balance support: Bilateral upper extremity  supported Standing balance-Leahy Scale: Poor Standing balance comment: UE support for balance in standing                    Cognition Arousal/Alertness: Awake/alert Behavior During Therapy: Flat affect Overall Cognitive Status: History of cognitive impairments - at baseline (per wife on the phone)                      Exercises      General Comments General comments (skin integrity, edema, etc.): BP 131/92 after ambulation.  Prior to working with pt spoke with wife via phone as pt on phone as I entered room and she requested to speak with me.  Discussed that she is used to assisting him with mobility at home and plans to take him home.  Has shower seat, walker and newly installed raised toilet seat and will have someone install grabbars she has already purchased.      Pertinent Vitals/Pain Pain Assessment: No/denies pain    Home Living                      Prior Function            PT Goals (current goals can now be found in the care plan section) Progress towards PT goals: Progressing toward goals    Frequency  Min 3X/week    PT Plan Discharge plan needs to be updated    Co-evaluation  End of Session Equipment Utilized During Treatment: Gait belt Activity Tolerance: Patient tolerated treatment well Patient left: in chair;with chair alarm set;with nursing/sitter in room     Time: 1220-1253 PT Time Calculation (min) (ACUTE ONLY): 33 min  Charges:  $Gait Training: 23-37 mins                    G Codes:      Reginia Naas 2015-07-16, 1:53 PM  Magda Kiel, [place] July 16, 2015

## 2015-07-13 NOTE — Progress Notes (Signed)
[place] Progress Note Patient Name: Benjamin Park DOB: 19-Jan-1941 MRN: [id number]   Date of Service  07/13/2015  HPI/Events of Note  1) Hypotension post intubation and on propofol.  No CVL 2) No SUP  eICU Interventions  1) Standard strength phenylephrine ordered to maintain MAP > 65 mmHg 2) DC scheduled labetalol 3) Low dose PRN metoprolol to maintain HR < 115/min 3) SUP ordered     Intervention Category Major Interventions: Hypotension - evaluation and management  Merton Border 07/13/2015, 6:35 PM

## 2015-07-13 NOTE — Discharge Instructions (Signed)
Follow with No primary care provider on file. in 5-7 days ° °Please get a complete blood count and chemistry panel checked by your Primary MD at your next visit, and again as instructed by your Primary MD. Please get your medications reviewed and adjusted by your Primary MD. ° °Please request your Primary MD to go over all Hospital Tests and Procedure/Radiological results at the follow up, please get all Hospital records sent to your Prim MD by signing hospital release before you go home. ° °If you had Pneumonia of Lung problems at the Hospital: °Please get a 2 view Chest X ray done in 6-8 weeks after hospital discharge or sooner if instructed by your Primary MD. ° °If you have Congestive Heart Failure: °Please call your Cardiologist or Primary MD anytime you have any of the following symptoms:  °1) 3 pound weight gain in 24 hours or 5 pounds in 1 week  °2) shortness of breath, with or without a dry hacking cough  °3) swelling in the hands, feet or stomach  °4) if you have to sleep on extra pillows at night in order to breathe ° °Follow cardiac low salt diet and 1.5 lit/day fluid restriction. ° °If you have diabetes °Accuchecks 4 times/day, Once in AM empty stomach and then before each meal. °Log in all results and show them to your primary doctor at your next visit. °If any glucose reading is under 80 or above 300 call your primary MD immediately. ° °If you have Seizure/Convulsions/Epilepsy: °Please do not drive, operate heavy machinery, participate in activities at heights or participate in high speed sports until you have seen by Primary MD or a Neurologist and advised to do so again. ° °If you had Gastrointestinal Bleeding: °Please ask your Primary MD to check a complete blood count within one week of discharge or at your next visit. Your endoscopic/colonoscopic biopsies that are pending at the time of discharge, will also need to followed by your Primary MD. ° °Get Medicines reviewed and adjusted. °Please take  all your medications with you for your next visit with your Primary MD ° °Please request your Primary MD to go over all hospital tests and procedure/radiological results at the follow up, please ask your Primary MD to get all Hospital records sent to his/her office. ° °If you experience worsening of your admission symptoms, develop shortness of breath, life threatening emergency, suicidal or homicidal thoughts you must seek medical attention immediately by calling 911 or calling your MD immediately  if symptoms less severe. ° °You must read complete instructions/literature along with all the possible adverse reactions/side effects for all the Medicines you take and that have been prescribed to you. Take any new Medicines after you have completely understood and accpet all the possible adverse reactions/side effects.  ° °Do not drive or operate heavy machinery when taking Pain medications.  ° °Do not take more than prescribed Pain, Sleep and Anxiety Medications ° °Special Instructions: If you have smoked or chewed Tobacco  in the last 2 yrs please stop smoking, stop any regular Alcohol  and or any Recreational drug use. ° °Wear Seat belts while driving. ° °Please note °You were cared for by a hospitalist during your hospital stay. If you have any questions about your discharge medications or the care you received while you were in the hospital after you are discharged, you can call the unit and asked to speak with the hospitalist on call if the hospitalist that took care of you is   not available. Once you are discharged, your primary care physician will handle any further medical issues. Please note that NO REFILLS for any discharge medications will be authorized once you are discharged, as it is imperative that you return to your primary care physician (or establish a relationship with a primary care physician if you do not have one) for your aftercare needs so that they can reassess your need for medications and  monitor your lab values. ° °You can reach the hospitalist office at phone [phone] or fax [phone] °  °If you do not have a primary care physician, you can call [phone] for a physician referral. ° °Activity: As tolerated with Full fall precautions use walker/cane & assistance as needed ° °Diet: heart healthy ° °Disposition Home  ° ° °

## 2015-07-13 NOTE — Progress Notes (Signed)
Pharmacy Antibiotic Note  Benjamin Park is a 75 y.o. male admitted on 06/19/2015 with pneumonia.  Pharmacy has been consulted for Zosyn dosing.  Plan: 75yo male with pneumonia.  Pt is on Zosyn and renal fxn is stable.  No dosage adjustments necessary.   Height: 6' (182.9 cm) Weight: 156 lb 14.4 oz (71.169 kg) IBW/kg (Calculated) : 77.6  Temp (24hrs), Avg:98.3 F (36.8 C), Min:98.2 F (36.8 C), Max:98.5 F (36.9 C)   Recent Labs Lab 06/28/2015 1843  07/08/15 0105 07/08/15 0120 07/09/15 0505 07/09/15 2017 07/10/15 0300 07/11/15 0315 07/12/15 0400 07/13/15 0725  WBC 10.3  --  9.5  --  12.5*  --   --  8.8 5.9  --   CREATININE 1.09  < > 1.13  --  0.85 0.90 0.96 0.96 0.99 1.05  LATICACIDVEN  --   --  2.0 1.90  --   --   --   --   --   --   < > = values in this interval not displayed.  Estimated Creatinine Clearance: 62.2 mL/min (by C-G formula based on Cr of 1.05).    No Known Allergies  Antimicrobials this admission: 4/20 Zosyn>>  4/20 Vanc>>4/21  Microbiology results: 4/19 blood cx x 2 >> NF  4/20 resp cx>> Kleb pneumo and Pseudomonas, both sensitive to Zosyn,    Thank you for allowing pharmacy to be a part of this patient's care.  Gracy Bruins, PharmD Clinical Pharmacist [facility]

## 2015-07-13 NOTE — Significant Event (Signed)
Rapid Response Event Note  Overview:  Called to see patient with resp distress and AMS Time Called: 1605 Arrival Time: 1610 Event Type: Respiratory, Neurologic, Other (Comment)  Initial Focused Assessment:  On arrival patient in chair - alert - will follow commands - right side movement to command and spont - left side weaker - moves left leg to pain - left arm flaccid - pupils 67mm ERL - will look left and right but has left gaze preference - blinks bil to threat - smiles with symmetrical face - skin warm and moist - few words garbled speech- nods no to pain or SOB - RR 32 and labored - upper airway congestion - coughs fair - slumped in chair - O2 sats 94% on 4 liters nasal cannula - BP 193/135 HR 107  CBG 85.  RN Greta reports patient was getting ready to go home - had walked few hours before - right jugular IV discontinued - had eaten lunch - neuro was intact at that time - wife present - denies any coughing with eating.  Sudden onset of distress. Wife present and reports he "does this when his BP is high."  Dr. Cruzita Lederer had examined patient - orders written.  Staff attempting 12 lead EKG.    Interventions:   Stat call for ABG, PCXR and CT head without contrast per orders.  Patient lifted back to bed - HOB elevated - # 22 angio left arm per Ginger RN-  duo-nebulizer given - resps more relaxed - some rhonchi noted right side - upper airway now clear. Lasix 40 mg IV given by Alvis Lemmings RN.   Hydralazine 10 mg IV given for elevated BP.  1627:  180/127  1633:  168/110.  Dr. Cruzita Lederer consulted PCCM - patient to transfer to ICU.  Patient becoming more somnolent - left leg with movement to pain - left arm remains without movement - respirations more shallow - will cough with stimulation but not to command as earlier - concern now for airway - condom catheter placed -  1642:  159/62 - HR 107 RR 28 shallow - O2 sats 91% on nasal cannula - placed on NRB mask - Dr. Cruzita Lederer aware - contacted PCCM will transfer to ICU for  airway control prior to CT scan.  Transported to [id number] - handoff the [place] RN and Dr. Nelda Marseille.   Event Summary: Name of Physician Notified: Dr. Cruzita Lederer at  (pta RRT)  Name of Consulting Physician Notified: PCCM at 1620 (by Dr. Cruzita Lederer)  Outcome: Transferred (Comment), Code status clarified  Event End Time: 1715  Quin Hoop

## 2015-07-13 NOTE — Progress Notes (Signed)
Speech Language Pathology Treatment: Dysphagia  Patient Details Name: Benjamin Park MRN: [id number] DOB: July 13, 1940 Today's Date: 07/13/2015 Time: 1400-1430 SLP Time Calculation (min) (ACUTE ONLY): 30 min  Assessment / Plan / Recommendation Clinical Impression  F/u after yesterday's MBS.  Reviewed video, results with pt's wife.  Pt is for likely D/C home this afternoon.  Had long discussion with Mrs. Benjamin Park re: swallowing abilities.  Recommended diet after yesterday's MBS was dysphagia 2, pudding thick liquids due to intermittent silent aspiration of consistencies thinner than pudding/puree.  Pt has chronic hx of dysphagia with aspiration dating back to 2012.  He has been followed by SLP services and undergone multiple MBS studies.  He has been eating a modified diet with some variety of thickened liquids for years.  We discussed the concerns for dehydration when restricted to such thick liquids, and the consequence of drinking thinner liquids, which may lead to further bouts of aspiration pna.  There are no easy solutions in this situation; Mrs. Benjamin Park appears to have a good understanding of the consequences of diet choices.    Benjamin Park improves his airway protection when tucking his chin with all PO intake, but he requires verbal cues each time in order to remember to carry out postural change.  Recommend [place] SLP f/u in conjunction with HHPT.  Recommend continued chopped diet; Mrs. Benjamin Park to thicken  liquids per her discretion, as she has a good understanding of pt's situation; she understands the risks vs benefits.    HPI HPI: 75 y.o. male with PMH as outlined below including uncontrolled hypertension for which he has had multiple admissions. He was brought to [facility] ED 04/19 evening by his wife who informed staff that he was having a stroke due to the fact that he was unable to follow commands and had gargled speech. He has hx of hemorrhagic stroke and apparently presented very similarly at the time.  He was last seen normal around 1pm earlier that afternoon. Wife stated that he never had any focal deficits, only gargled speech and inability follow commands. Pt has hx of throat cancer with radiation from 2000; he has long hx of dysphagia with repeated MBS studies and frequent aspiration.       SLP Plan  Discharge SLP treatment due to (comment) (D/C home today)     Recommendations  Diet recommendations: Dysphagia 2 (fine chop);Honey-thick liquid;Pudding-thick liquid - all with chin tuck.  Liquids provided via: Teaspoon Supervision: Patient able to self feed Compensations: Minimize environmental distractions;Slow rate Postural Changes and/or Swallow Maneuvers: Chin tuck             Oral Care Recommendations: Oral care BID Plan: Discharge SLP treatment due to (comment) (D/C home today)     GO                Benjamin Park 07/13/2015, 2:37 PM

## 2015-07-13 NOTE — Progress Notes (Addendum)
PROGRESS NOTE  Benjamin Park HYW:[id number] DOB: 07-09-1940 DOA: 07/09/2015 PCP: No primary care provider on file. Outpatient Specialists:    LOS: 6 days   Brief Narrative: 75 y.o. male with PMH as outlined below including uncontrolled hypertension for which he has had multiple admissions. He was brought to [facility] ED 04/19 evening by his wife who informed staff that he was having a stroke due to the fact that he was unable to follow commands and had gargled speech. He has hx of hemorrhagic stroke and apparently presented very similarly at the time. He was last seen normal around 1pm earlier that afternoon. Wife stated that he never had any focal deficits, only gargled speech and inability follow commands. In ED, he had elevated BP's into the low 200's. He was started on nicardipine and after only 3 - 5 minutes, BP dropped into low 100's per ED RN. He was evaluated by neurology and initial head CT was negative for acute infarct or hemorrhage. Per wife, pt's BP was up to 165 earlier in the day and she tried to give him his oral meds; however, he was unable to swallow them due to vomiting. He was initially being admitted by [facility]; however, while in ED, he had sudden change in mental status to the point where he became minimally responsive. He also had BP down to 98 systolic along with hypoxia. ABG revealed pO2 of 50 and repeat CXR showed new LLL infiltrate, likely due to aspiration PNA. Due to his rapid decline, he was intubated for airway protection and PCCM was called to admit him to the ICU for further evaluation and management.   Assessment & Plan: Active Problems:   Sleep apnea   S/P patent foramen ovale closure   Abnormal involuntary movement   Hypertensive emergency   Acute encephalopathy   Encephalopathy acute   Frequent falls   Vomiting   History of TIA (transient ischemic attack)   Hypokalemia   Aspiration pneumonia (HCC)   Hypertensive urgency, malignant   Aspiration pneumonia  of both lower lobes due to vomit ([place])   Confusion  Acute hypoxic respiratory failure - due to aspiration PNA, AMS - requiring Vent on admission, extubated 4/21 and stable on the floor since  - did well on the floor, on room air up until 4/25 and was cleared for discharge, however prior to leaving home RN paged that he started to have decreased oxygen saturations.   Patient evaluated bedside 4pm, it appears that he aspirated and is requiring nasal cannula. He initially appeared to be mentating well with equal strength, however became progressively lethargic as his blood pressure started began to increase. Blood pressure started to rise 485 systolic requiring 10 mg IV hydralazine. STAT EKG with sinus tachycardia. Received 40 mg IV Lasix, STAT CXR. Progressively becoming more altered, concern now for airway protection, called PCCM for critical care consult and will move to ICU as he may need intubation. Will obtain ABG. He appears weak on left side, has a history of ICH, obtain CT brain.   Patient with prior similar presentations with acute encephalopathy in the setting of hypertensive crises  HTN emergency - initially on cardene gtt, now back on home regimen   - BP 114/87, at baseline set for d/c when respiratory status worsened, in the setting of hypoxia and distress BP 141/110, repeat in the room 462V systolic.   Acute encephalopathy likely hypertensive encephalopathy  - may be related to hypertensive emergency, has a history of same as documented  in outpatient notes - MRI brain 4/20 without acute findings  Hypokalemia - replete and monitor - monitor  Hypothyroidism  - continue synthroid, TSH 0.8 07/08/15  Septic shock due to aspiration PNA - briefly on levophed - initially on Vanc/Zosyn, now Zosyn alone.  - continue Zosyn for aspiration   Chronic dysphagia - due to prior CVA - dysphagia diet  Prior CVAs and history of right frontal subarachnoid hemorrhage - MRI this  hospitalization negative   DVT prophylaxis: heparin Code Status: Full Family Communication: wife bedside Disposition Plan: TBD Barriers for discharge: new onset hypoxia   Consultants:   None   Procedures:   EEG 4/21  Antimicrobials:  Vancomycin 4/20 >> 4/22  Zosyn 4/20 >>   Subjective: - no complaints this morning, wanting to go home, re-evaluated later int he day, alert however with coarse breath sounds. Denies dyspnea  Objective: Filed Vitals:   07/13/15 0521 07/13/15 1016 07/13/15 1253 07/13/15 1543  BP: 126/74 114/87 131/92 141/110  Pulse: 68 74 81 110  Temp: 98.2 F (36.8 C)   98.6 F (37 C)  TempSrc:    Axillary  Resp: 18   20  Height:      Weight:      SpO2: 97%  98% 85%    Intake/Output Summary (Last 24 hours) at 07/13/15 1557 Last data filed at 07/13/15 1400  Gross per 24 hour  Intake    650 ml  Output   1400 ml  Net   -750 ml   Filed Weights   07/11/15 0438 07/12/15 0350 07/12/15 1039  Weight: 72.9 kg (160 lb 11.5 oz) 72.899 kg (160 lb 11.4 oz) 71.169 kg (156 lb 14.4 oz)    Examination: Constitutional: NAD Filed Vitals:   07/13/15 0521 07/13/15 1016 07/13/15 1253 07/13/15 1543  BP: 126/74 114/87 131/92 141/110  Pulse: 68 74 81 110  Temp: 98.2 F (36.8 C)   98.6 F (37 C)  TempSrc:    Axillary  Resp: 18   20  Height:      Weight:      SpO2: 97%  98% 85%   ENMT: Mucous membranes are moist. Respiratory: coarse breath sounds, diffuse wheezing  Cardiovascular: Regular rate and rhythm, no murmurs / rubs / gallops.  Abdomen: no tenderness, Bowel sounds positive.  Musculoskeletal: no clubbing / cyanosis Neurologic: non focal    Data Reviewed: I have personally reviewed following labs and imaging studies  CBC:  Recent Labs Lab 06/20/2015 1843 06/19/2015 1852 07/08/15 0105 07/09/15 0505 07/11/15 0315 07/12/15 0400  WBC 10.3  --  9.5 12.5* 8.8 5.9  NEUTROABS 9.6*  --   --   --   --   --   HGB 15.8 17.7* 14.4 12.1* 12.0* 11.4*  HCT  46.5 52.0 43.3 36.0* 36.0* 34.8*  MCV 94.9  --  95.6 94.0 95.5 96.4  PLT 190  --  178 172 153 950   Basic Metabolic Panel:  Recent Labs Lab 07/08/15 2129 07/09/15 0505 07/09/15 1434 07/09/15 2017 07/10/15 0300 07/10/15 1341 07/11/15 0315 07/12/15 0400 07/13/15 0725  NA  --  134*  --  141 142  --  144 143 141  K  --  2.8*  --  3.0* 3.3*  --  2.8* 2.9* 3.4*  CL  --  101  --  107 107  --  107 107 107  CO2  --  23  --  26 27  --  '27 25 25  '$ GLUCOSE  --  158*  --  135* 123*  --  97 98 100*  BUN  --  21*  --  13 7  --  '7 13 10  '$ CREATININE  --  0.85  --  0.90 0.96  --  0.96 0.99 1.05  CALCIUM  --  7.7*  --  8.3* 8.4*  --  8.5* 8.3* 8.5*  MG 2.1 1.9 2.1  --  2.1 2.0  --   --   --   PHOS 2.3* 1.5* 2.5  --  1.1*  --  2.8 2.7 2.9   Liver Function Tests:  Recent Labs Lab 07/09/2015 1843  AST 21  ALT 20  ALKPHOS 77  BILITOT 0.8  PROT 7.9  ALBUMIN 3.9   Coagulation Profile:  Recent Labs Lab 06/28/2015 1843  INR 1.15   Cardiac Enzymes:  Recent Labs Lab 07/08/15 0105  TROPONINI 0.05*   CBG:  Recent Labs Lab 07/12/15 1151 07/12/15 1720 07/12/15 2144 07/13/15 0752 07/13/15 1136  GLUCAP 105* 94 118* 94 107*   Lipid Profile: No results for input(s): CHOL, HDL, LDLCALC, TRIG, CHOLHDL, LDLDIRECT in the last 72 hours. Thyroid Function Tests: No results for input(s): TSH, T4TOTAL, FREET4, T3FREE, THYROIDAB in the last 72 hours. Anemia Panel: No results for input(s): VITAMINB12, FOLATE, FERRITIN, TIBC, IRON, RETICCTPCT in the last 72 hours. Urine analysis:    Component Value Date/Time   COLORURINE YELLOW 07/08/2015 0728   APPEARANCEUR CLOUDY* 07/08/2015 0728   LABSPEC 1.022 07/08/2015 0728   PHURINE 6.0 07/08/2015 0728   GLUCOSEU NEGATIVE 07/08/2015 0728   HGBUR LARGE* 07/08/2015 0728   BILIRUBINUR NEGATIVE 07/08/2015 0728   KETONESUR NEGATIVE 07/08/2015 0728   PROTEINUR 100* 07/08/2015 0728   UROBILINOGEN 0.2 08/14/2014 1200   NITRITE NEGATIVE 07/08/2015 0728    LEUKOCYTESUR NEGATIVE 07/08/2015 0728   Sepsis Labs: Invalid input(s): PROCALCITONIN, LACTICIDVEN  Recent Results (from the past 240 hour(s))  Blood culture (routine x 2)     Status: None   Collection Time: 07/03/2015  8:37 PM  Result Value Ref Range Status   Specimen Description BLOOD LEFT HAND  Final   Special Requests IN PEDIATRIC BOTTLE [place]  Final   Culture NO GROWTH 5 DAYS  Final   Report Status 07/12/2015 FINAL  Final  Blood culture (routine x 2)     Status: None   Collection Time: 07/12/2015  8:45 PM  Result Value Ref Range Status   Specimen Description BLOOD RIGHT HAND  Final   Special Requests IN PEDIATRIC BOTTLE 1CC  Final   Culture NO GROWTH 5 DAYS  Final   Report Status 07/12/2015 FINAL  Final  MRSA PCR Screening     Status: None   Collection Time: 07/08/15  2:36 AM  Result Value Ref Range Status   MRSA by PCR NEGATIVE NEGATIVE Final    Comment:        The GeneXpert MRSA Assay (FDA approved for NASAL specimens only), is one component of a comprehensive MRSA colonization surveillance program. It is not intended to diagnose MRSA infection nor to guide or monitor treatment for MRSA infections.   Culture, respiratory (NON-Expectorated)     Status: None   Collection Time: 07/08/15  2:42 AM  Result Value Ref Range Status   Specimen Description TRACHEAL ASPIRATE  Final   Special Requests Normal  Final   Gram Stain   Final    RARE WBC PRESENT, PREDOMINANTLY PMN RARE SQUAMOUS EPITHELIAL CELLS PRESENT FEW GRAM NEGATIVE RODS Performed at [facility]  Culture   Final    MODERATE KLEBSIELLA PNEUMONIAE MODERATE PSEUDOMONAS AERUGINOSA Performed at [facility]    Report Status 07/12/2015 FINAL  Final   Organism ID, Bacteria KLEBSIELLA PNEUMONIAE  Final   Organism ID, Bacteria PSEUDOMONAS AERUGINOSA  Final      Susceptibility   Klebsiella pneumoniae - MIC*    AMPICILLIN >=32 RESISTANT Resistant     AMPICILLIN/SULBACTAM 4 SENSITIVE Sensitive      CEFEPIME <=1 SENSITIVE Sensitive     CEFTAZIDIME <=1 SENSITIVE Sensitive     CEFTRIAXONE <=1 SENSITIVE Sensitive     CIPROFLOXACIN <=0.25 SENSITIVE Sensitive     GENTAMICIN <=1 SENSITIVE Sensitive     IMIPENEM <=0.25 SENSITIVE Sensitive     PIP/TAZO <=4 SENSITIVE Sensitive     TOBRAMYCIN <=1 SENSITIVE Sensitive     TRIMETH/SULFA Value in next row Sensitive      <=20 SENSITIVE(NOTE)    * MODERATE KLEBSIELLA PNEUMONIAE   Pseudomonas aeruginosa - MIC*    CEFEPIME Value in next row Sensitive      <=20 SENSITIVE(NOTE)    CEFTAZIDIME Value in next row Sensitive      <=20 SENSITIVE(NOTE)    CIPROFLOXACIN Value in next row Sensitive      <=20 SENSITIVE(NOTE)    GENTAMICIN Value in next row Sensitive      <=20 SENSITIVE(NOTE)    IMIPENEM Value in next row Sensitive      <=20 SENSITIVE(NOTE)    PIP/TAZO Value in next row Sensitive      <=20 SENSITIVE(NOTE)    TOBRAMYCIN Value in next row Sensitive      <=20 SENSITIVE(NOTE)    * MODERATE PSEUDOMONAS AERUGINOSA    Radiology Studies: Dg Swallowing Func-speech Pathology  07/12/2015  Objective Swallowing Evaluation: Type of Study: MBS-Modified Barium Swallow Study Patient Details Name: Benjamin Park MRN: [id number] Date of Birth: Jan 29, 1941 Today's Date: 07/12/2015 Time: SLP Start Time (ACUTE ONLY): 1254-SLP Stop Time (ACUTE ONLY): 1307 SLP Time Calculation (min) (ACUTE ONLY): 13 min Past Medical History: Past Medical History Diagnosis Date . Hx of transient ischemic attack (TIA)  . Hyperlipidemia  . ED (erectile dysfunction)  . Personal history of kidney stones  . Subarachnoid hemorrhage ([place])    '11- hospitalized -3 to 4 days(passed out-awoke in hospital)- no residual affects. . Myocardial infarction ([place])    12/1991 . Dysphagia  . Hypokalemia  . Hypothyroidism  . Sleep apnea    on CPAP-settings 6 . History of kidney stones 02-27-13   x5- none recent . Throat cancer [facility])    '00-radiation therapy . Stroke ([place])  . 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 Episodic confusion 02-27-13   hx. of, not currently(multiple episodes- ? TIA's)-always saw MD to be evaluated. Marland Kitchen AKI (acute kidney injury) ([place]) 02/23/2011   pt. denies any problems 02-27-13 . Hypertension    hx. labile - with spikes every 3-6 mos.-last 8'14 ED visit. Marland Kitchen Hypertensive encephalopathy  . Headache(784.0)    episodic with periods of confusion -multiple . Neuromuscular disorder ([place])    "parkinson's" -no tremor-sees neurology MD at [facility] . Coronary artery disease    Dr. [place] Past Surgical History: Past Surgical History Procedure Laterality Date . Angioplasty  1993   LAD . Cardiac catheterization   . Coronary angioplasty   . Basal cell cancer     head . Hernia repair   . Appendectomy   . Patent foramen ovale closure  2008 . Eye surgery   . Colonoscopy N/A 06/18/2012  Procedure: COLONOSCOPY;  Surgeon: Jeryl Columbia, MD;  Location: [facility] ENDOSCOPY;  Service: Endoscopy;  Laterality: N/A;  h&p in file-[place]  . Hot hemostasis N/A 06/18/2012   Procedure: HOT HEMOSTASIS (ARGON PLASMA COAGULATION/BICAP);  Surgeon: Jeryl Columbia, MD;  Location: [facility] ENDOSCOPY;  Service: Endoscopy;  Laterality: N/A; . Colonoscopy with propofol N/A 03/07/2013   Procedure: COLONOSCOPY WITH PROPOFOL;  Surgeon: Jeryl Columbia, MD;  Location: [facility] ENDOSCOPY;  Service: Endoscopy;  Laterality: N/A; HPI: 75 y.o. male with PMH as outlined below including uncontrolled hypertension for which he has had multiple admissions. He was brought to [facility] ED 04/19 evening by his wife who informed staff that he was having a stroke due to the fact that he was unable to follow commands and had gargled speech. He has hx of hemorrhagic stroke and apparently presented very similarly at the time. He was last seen normal around 1pm earlier that afternoon. Wife stated that he never had any focal deficits, only gargled speech and inability follow commands.  Subjective: pt alert, pleasant, HOH, says he coughs all the time when  eating and drinking Assessment / Plan / Recommendation CHL IP CLINICAL IMPRESSIONS 07/12/2015 Therapy Diagnosis Mild oral phase dysphagia;Severe pharyngeal phase dysphagia Clinical Impression Pt has a mild oral and severe pharyngeal dysphagia, which appears only mildly exacerbated from baseline swallowing function when compared to prior MBS studies. Oral phase is discoordinated with poor bolus cohesion and prolonged transit time, but ultimately with good clearance. He has a delay in swallow trigger with all consistencies reaching the pyriform sinuses, which results in aspiration of honey thick liquids and purees. Aspiration is intermittently sensed, with delayed and inconsistent coughing not entirely effective at clearing aspirates. With Max cues for a chin tuck, he is able to protect his airway with purees and Dys 2 textures. Mild residue remains in the valleculae, although it is not significantly changed with more solid boluses. Recommend Dys 2 diet and pudding thick liquids with full supervision for use of chin tuck. Impact on safety and function Moderate aspiration risk;Severe aspiration risk   CHL IP TREATMENT RECOMMENDATION 07/12/2015 Treatment Recommendations Therapy as outlined in treatment plan below   Prognosis 07/12/2015 Prognosis for Safe Diet Advancement Good Barriers to Reach Goals Cognitive deficits;Severity of deficits;Time post onset Barriers/Prognosis Comment -- CHL IP DIET RECOMMENDATION 07/12/2015 SLP Diet Recommendations Dysphagia 2 (Fine chop) solids;Pudding thick liquid Liquid Administration via Spoon Medication Administration Crushed with puree Compensations Minimize environmental distractions;Slow rate;Small sips/bites;Chin tuck Postural Changes Remain semi-upright after after feeds/meals (Comment);Seated upright at 90 degrees   CHL IP OTHER RECOMMENDATIONS 07/12/2015 Recommended Consults -- Oral Care Recommendations Oral care BID Other Recommendations Order thickener from pharmacy;Prohibited  food (jello, ice cream, thin soups);Remove water pitcher   CHL IP FOLLOW UP RECOMMENDATIONS 07/12/2015 Follow up Recommendations (No Data)   CHL IP FREQUENCY AND DURATION 07/12/2015 Speech Therapy Frequency (ACUTE ONLY) min 2x/week Treatment Duration 2 weeks      CHL IP ORAL PHASE 07/12/2015 Oral Phase Impaired Oral - Pudding Teaspoon -- Oral - Pudding Cup -- Oral - Honey Teaspoon Decreased bolus cohesion;Delayed oral transit Oral - Honey Cup -- Oral - Nectar Teaspoon -- Oral - Nectar Cup -- Oral - Nectar Straw -- Oral - Thin Teaspoon -- Oral - Thin Cup -- Oral - Thin Straw -- Oral - Puree Decreased bolus cohesion;Delayed oral transit Oral - Mech Soft Decreased bolus cohesion;Delayed oral transit Oral - Regular -- Oral - Multi-Consistency -- Oral - Pill -- Oral Phase - Comment --  CHL IP PHARYNGEAL PHASE 07/12/2015 Pharyngeal Phase Impaired Pharyngeal- Pudding Teaspoon -- Pharyngeal -- Pharyngeal- Pudding Cup -- Pharyngeal -- Pharyngeal- Honey Teaspoon Delayed swallow initiation-pyriform sinuses;Reduced anterior laryngeal mobility;Reduced laryngeal elevation;Reduced tongue base retraction;Penetration/Aspiration before swallow;Pharyngeal residue - valleculae;Compensatory strategies attempted (with notebox) Pharyngeal Material enters airway, passes BELOW cords without attempt by patient to eject out (silent aspiration) Pharyngeal- Honey Cup -- Pharyngeal -- Pharyngeal- Nectar Teaspoon -- Pharyngeal -- Pharyngeal- Nectar Cup -- Pharyngeal -- Pharyngeal- Nectar Straw -- Pharyngeal -- Pharyngeal- Thin Teaspoon -- Pharyngeal -- Pharyngeal- Thin Cup -- Pharyngeal -- Pharyngeal- Thin Straw -- Pharyngeal -- Pharyngeal- Puree Delayed swallow initiation-pyriform sinuses;Reduced anterior laryngeal mobility;Reduced laryngeal elevation;Reduced tongue base retraction;Penetration/Aspiration before swallow;Pharyngeal residue - valleculae;Compensatory strategies attempted (with notebox) Pharyngeal Material enters airway, passes BELOW  cords without attempt by patient to eject out (silent aspiration) Pharyngeal- Mechanical Soft Delayed swallow initiation-pyriform sinuses;Reduced anterior laryngeal mobility;Reduced laryngeal elevation;Reduced tongue base retraction;Pharyngeal residue - valleculae;Compensatory strategies attempted (with notebox) Pharyngeal Material does not enter airway Pharyngeal- Regular -- Pharyngeal -- Pharyngeal- Multi-consistency -- Pharyngeal -- Pharyngeal- Pill -- Pharyngeal -- Pharyngeal Comment --  CHL IP CERVICAL ESOPHAGEAL PHASE 07/12/2015 Cervical Esophageal Phase Impaired Pudding Teaspoon -- Pudding Cup -- Honey Teaspoon Esophageal backflow into cervical esophagus Honey Cup -- Nectar Teaspoon -- Nectar Cup -- Nectar Straw -- Thin Teaspoon -- Thin Cup -- Thin Straw -- Puree Esophageal backflow into cervical esophagus Mechanical Soft Esophageal backflow into cervical esophagus Regular -- Multi-consistency -- Pill -- Cervical Esophageal Comment -- No flowsheet data found. [name], M.A. CCC-SLP [phone] [name] 07/12/2015, 1:44 PM              Scheduled Meds: . antiseptic oral rinse  7 mL Mouth Rinse QID  . aspirin  325 mg Per Tube Daily  . chlorhexidine gluconate (SAGE KIT)  15 mL Mouth Rinse BID  . furosemide  40 mg Intravenous Once  . heparin  5,000 Units Subcutaneous Q8H  . insulin aspart  0-9 Units Subcutaneous TID WC  . labetalol  100 mg Oral BID  . levothyroxine  112 mcg Oral QAC breakfast  . piperacillin-tazobactam  3.375 g Intravenous Once  . piperacillin-tazobactam (ZOSYN)  IV  3.375 g Intravenous Q8H  . pravastatin  20 mg Per Tube q1800   Continuous Infusions: . dextrose 5 % and 0.9% NaCl 50 mL/hr at 07/13/15 0301   Time spent: 90 minutes, over 35 critical care minutes bedside   Marzetta Board, MD, PhD [facility] Pager [phone]  If 7PM-7AM, please contact night-coverage [email or web address] Password [facility] 07/13/2015, 3:57 PM

## 2015-07-13 NOTE — Care Management Important Message (Signed)
Important Message  Patient Details  Name: Benjamin Park MRN: [id number] Date of Birth: 07/31/1940   Medicare Important Message Given:  Other (see comment)    [place], Jermal Dismuke Abena 07/13/2015, 11:24 AM

## 2015-07-13 NOTE — Progress Notes (Signed)
RN went in the patient room to check on him before his discharge and noticed that patient presents SOB and audible wheezing. Patient verbalize that he is not feeling well and that he has problems breathing. The patient's wife confirmed that the patient has now problems breathing and that this is new for him today. Patient was ready to be discharged and she was worried about his state. His satO2=85% on room air and BP elevated (initialy - 141/110 and went up to 193/135), P=110; R=20. Patient received oxygen via NS 3L/min and MD was notified. On 3L oxygen satO2=92%. Per MD order patient received Lasix 40mg  IV and Hydralazine 10mg  IV per PRN order. BP came down to 171/106. EKG STAT - done; rapid response was called and per MD order patient was transfered to 2M14. Report was given to RN who received the patient.

## 2015-07-13 NOTE — Procedures (Signed)
OGT Placement By MD  Placed under direct laryngoscopy then advanced and confirmed by auscultation.  Rush Farmer, M.D. [facility] Pulmonary/Critical Care Medicine. Pager: [phone]. After hours pager: [phone].

## 2015-07-13 NOTE — Progress Notes (Signed)
Patient arrived from medical floor, unstable, responding only to pain and loud voice, Left side flacid. MD to bedside, planned emergent intubation, second IV site obtained.  Large bowel movement cleansed from patient. Will continue to monitor patient very closely.

## 2015-07-13 NOTE — Care Management Note (Signed)
Case Management Note  Patient Details  Name: Benjamin Park MRN: [id number] Date of Birth: 12-02-1940  Subjective/Objective:                 Spoke with patient's wife on the phone> She states that they have used [facility] in the past for [facility] and would like to use them again. Patient is declining SNF. She states that patient has a walker and a shower seat at home and does not need any further DME.    Action/Plan:  Anticipate DC today, referral made to [facility] for [facility] RN PT Expected Discharge Date:                  Expected Discharge Plan:  [place]  In-House Referral:  Clinical Social Work  Discharge planning Services  CM Consult  Post Acute Care Choice:  Home Health Choice offered to:  Spouse  DME Arranged:    DME Agency:     HH Arranged:  RN, PT [place] Agency:  [place]  Status of Service:  Completed, signed off  Medicare Important Message Given:  Other (see comment) Date Medicare IM Given:    Medicare IM give by:    Date Additional Medicare IM Given:    Additional Medicare Important Message give by:     If discussed at [place] of Stay Meetings, dates discussed:    Additional Comments:  Carles Collet, RN 07/13/2015, 11:53 AM

## 2015-07-13 NOTE — Progress Notes (Signed)
CSW spoke with pt wife this am- she discussed with pt yesterday evening and pt is refusing SNF at this time and pt wife is willing to take him home- requests to speak with PT first- CSW paged PT and informed  RNCM informed of choice to go home  CSW signing off  Domenica Reamer, [place] Worker [phone]

## 2015-07-13 NOTE — Progress Notes (Signed)
Pharmacy Antibiotic Note  Benjamin Park is a 75 y.o. male admitted on 06/21/2015 with pneumonia.  Pharmacy has been consulted for vancomycin and Zosyn dosing. WBC WNL, Creat 1.05, wt 71.2 kg.   Plan: Vancomycin 1 gm IV q12h Continue zosyn 3.375 gm q8h F/u renal fxn, wbc, temp, culture data vanc levels as needed  Height: 5\' 7"  (170.2 cm) Weight: 156 lb 14.4 oz (71.169 kg) IBW/kg (Calculated) : 66.1  Temp (24hrs), Avg:98.4 F (36.9 C), Min:98.2 F (36.8 C), Max:98.6 F (37 C)   Recent Labs Lab 06/30/2015 1843  07/08/15 0105 07/08/15 0120 07/09/15 0505 07/09/15 2017 07/10/15 0300 07/11/15 0315 07/12/15 0400 07/13/15 0725  WBC 10.3  --  9.5  --  12.5*  --   --  8.8 5.9  --   CREATININE 1.09  < > 1.13  --  0.85 0.90 0.96 0.96 0.99 1.05  LATICACIDVEN  --   --  2.0 1.90  --   --   --   --   --   --   < > = values in this interval not displayed.  Estimated Creatinine Clearance: 57.7 mL/min (by C-G formula based on Cr of 1.05).    No Known Allergies  Antimicrobials this admission: 4/20 Zosyn>>  4/20 Vanc>>4/21  4/25 >>  Microbiology results: 4/19 blood cx x 2 >> NF  4/20 resp cx>> Kleb pneumo and Pseudomonas, both sensitive to Zosyn,    Thank you for allowing pharmacy to be a part of this patient's care.  Eudelia Bunch, Pharm.D. BP:[phone] 07/13/2015 5:58 PM

## 2015-07-13 NOTE — Progress Notes (Signed)
PULMONARY / CRITICAL CARE MEDICINE   Name: Benjamin Park MRN: [id number] DOB: 1941-01-01    ADMISSION DATE:  07/15/2015 CONSULTATION DATE:  07/08/15  REFERRING MD:  EDP  CHIEF COMPLAINT:  AMS  HISTORY OF PRESENT ILLNESS:  Pt is encephelopathic; therefore, this HPI is obtained from chart review. Benjamin Park is a 75 y.o. male with PMH as outlined below including uncontrolled hypertension for which he has had multiple admissions.  He was brought to [facility] ED 04/19 evening by his wife who informed staff that he was having a stroke due to the fact that he was unable to follow commands and had gargled speech.  He has hx of hemorrhagic stroke and apparently presented very similarly at the time.  He was last seen normal around 1pm earlier that afternoon.  Wife stated that he never had any focal deficits, only gargled speech and inability follow commands.  In ED, he had elevated BP's into the low 200's.  He was started on nicardipine and after only 3 - 5 minutes, BP dropped into low 100's per ED RN.  He was evaluated by neurology and initial head CT was negative for acute infarct or hemorrhage.  Per wife, pt's BP was up to 165 earlier in the day and she tried to give him his oral meds; however, he was unable to swallow them due to vomiting. He was initially being admitted by [facility]; however, while in ED, he had sudden change in mental status to the point where he became minimally responsive.  He also had BP down to 98 systolic along with hypoxia.  ABG revealed pO2 of 50 and repeat CXR showed new LLL infiltrate, likely due to aspiration PNA.  Due to his rapid decline, he was intubated for airway protection and PCCM was called to admit him to the ICU for further evaluation and management.  He has brain MRI pending.  SUBJECTIVE: Rapid response called with patient with acute change in mental status and respiratory failure.  VITAL SIGNS: BP 171/106 mmHg  Pulse 102  Temp(Src) 98.6 F (37 C) (Axillary)   Resp 24  Ht 6' (1.829 m)  Wt 71.169 kg (156 lb 14.4 oz)  BMI 21.27 kg/m2  SpO2 90%  HEMODYNAMICS:    VENTILATOR SETTINGS:    INTAKE / OUTPUT: I/O last 3 completed shifts: In: 1100 [P.O.:300; I.V.:700; IV Piggyback:100] Out: 2500 [Urine:2500]  PHYSICAL EXAMINATION: General: Elderly male, chronically ill appearing, in NAD. Neuro: Alert, following commands, gen weakness  HEENT: [place]/AT. PERRL, sclerae anicteric. Cardiovascular: RRR, no M/R/G.  Lungs: Respirations even and unlabored.  CTA bilaterally, No W/R/R. Abdomen: BS x 4, soft, NT/ND.  Musculoskeletal: No gross deformities, no edema.  Skin: Intact, warm, no rashes.  LABS:  BMET  Recent Labs Lab 07/11/15 0315 07/12/15 0400 07/13/15 0725  NA 144 143 141  K 2.8* 2.9* 3.4*  CL 107 107 107  CO2 27 25 25   BUN 7 13 10   CREATININE 0.96 0.99 1.05  GLUCOSE 97 98 100*   Electrolytes  Recent Labs Lab 07/09/15 1434  07/10/15 0300 07/10/15 1341 07/11/15 0315 07/12/15 0400 07/13/15 0725  CALCIUM  --   < > 8.4*  --  8.5* 8.3* 8.5*  MG 2.1  --  2.1 2.0  --   --   --   PHOS 2.5  --  1.1*  --  2.8 2.7 2.9  < > = values in this interval not displayed.  CBC  Recent Labs Lab 07/09/15 0505 07/11/15 0315 07/12/15  0400  WBC 12.5* 8.8 5.9  HGB 12.1* 12.0* 11.4*  HCT 36.0* 36.0* 34.8*  PLT 172 153 158   Coag's  Recent Labs Lab 06/19/2015 1843  APTT 32  INR 1.15   Sepsis Markers  Recent Labs Lab 07/08/15 0105 07/08/15 0120 07/09/15 0505 07/10/15 0300  LATICACIDVEN 2.0 1.90  --   --   PROCALCITON 0.55  --  2.36 1.31   ABG  Recent Labs Lab 07/08/15 1330 07/09/15 0409 07/09/15 1841  PHART 7.343* 7.379 7.507*  PCO2ART 42.2 44.0 24.9*  PO2ART 97.0 161.0* 70.0*   Liver Enzymes  Recent Labs Lab 06/28/2015 1843  AST 21  ALT 20  ALKPHOS 77  BILITOT 0.8  ALBUMIN 3.9   Cardiac Enzymes  Recent Labs Lab 07/08/15 0105  TROPONINI 0.05*   Glucose  Recent Labs Lab 07/12/15 1151 07/12/15 1720  07/12/15 2144 07/13/15 0752 07/13/15 1136 07/13/15 1624  GLUCAP 105* 94 118* 94 107* 85   Imaging No results found.  STUDIES:  CT head 04/19 > no acute finding.  Chronic small vessel ischemic changes. CXR 04/20 > focal consolidation LLL and developing consolidation RUL. Brain MRI 04/20 > no acute finding. Advanced atrophy and diffuse white matter disease b/l.   CULTURES: Blood 04/20 > NG <24 hours Sputum 04/20 >   ANTIBIOTICS: Zosyn 04/20 >>> Vanc 4/20>>>  SIGNIFICANT EVENTS: 04/20 > admitted with hypertensive urgency, AMS, ? PRES, ? CVA, aspiration PNA, acute hypoxic respiratory failure.  LINES/TUBES: ETT 04/20 >4/21  DISCUSSION: 75 y.o. M with hx uncontrolled HTN and previous hemorrhagic stroke, admitted 04/20 with hypertensive crisis.  While in ED, had worsening mental status along with hypoxic respiratory failure, likely due to aspiration PNA.  He required intubation and PCCM was called to admit to ICU.  Brain MRI pending to rule out CVA and further evaluate for PRES.  ASSESSMENT / PLAN:  NEUROLOGIC A:   Acute encephalopathy - unclear etiology at this point but likely exacerbated by hypertension.  Initial head CT negative for acute infarct or hemorrhage; however, history and exam still concerning for underlying ischemic stroke. Consider PRES given hx of severe uncontrolled HTN. Hx TIA, CVA, SAH, hypertensive encephalopathy, headache. Evidently every time the patient's SBP increases above 200 his mental status decompensates and takes days to recover vs acute aspiration (did not do well during swallow evaluation earlier). P:   Head CT without contrast today to r/o hemorrhage. Neurology following, appreciate the assistance. BP control, hope to get down to the 150's relatively quickly.  CARDIOVASCULAR A:  Hypertensive urgency/emergency - resolved after cardene gtt started; cardene now off. Septic shock>resolved , off pressors 4/22  Hx HTN, HLD, MI. 4/22>b/p tr up   P:  Continue outpatient ASA, lovastatin. MAP goal is 25% -30% reduction from highest MAP to allow autoregulation, goal MAP 80-110, will check propofol effect first Cortisol > 100, no role stress steroids Continue current anti-HTN.  PULMONARY A: Acute hypoxic respiratory failure - likely due to aspiration.>extubaed 4/21  Probable aspiration PNA. Hx OSA - on CPAP. P:   Intubated. Full mechanical support. ABG and CXR post intubation and in AM.  Abx/cultures per ID section. Titrate O2 for sat of 88-92%.  RENAL A:   Hypokalemia - resolved HypoMg HypoPhos Hyponatremia cvp low P:   KVO IVF. BMP in AM. Replace electrolytes as indicated.  GASTROINTESTINAL A:   GI prophylaxis. Nutrition. Hx dysphagia - has apparently failed swallow evals in the past and refuses to use thickening solution. -4/22 ST eval with D2  diet rec  P:   SUP: Pantoprazole. TF per nutrition.   HEMATOLOGIC A:   VTE Prophylaxis. P:  SCD's / heparin. CBC in AM.  INFECTIOUS A:   Probable aspiration PNA. Septic shock-resolved. P:   Continue Zosyn. Vancomycin per pharmacy. UA negative. Follow cultures.   ENDOCRINE A:   Hypothyroidism. P:   Continue outpatient synthroid   TSH wnl  Family updated: Wife updated.  Interdisciplinary Family Meeting v Palliative Care Meeting:  Due by: 07/14/15.  The patient is critically ill with multiple organ systems failure and requires high complexity decision making for assessment and support, frequent evaluation and titration of therapies, application of advanced monitoring technologies and extensive interpretation of multiple databases.   Critical Care Time devoted to patient care services described in this note is  35  Minutes. This time reflects time of care of this signee Dr Jennet Maduro. This critical care time does not reflect procedure time, or teaching time or supervisory time of PA/NP/Med student/Med Resident etc but could involve care discussion  time.  Rush Farmer, M.D. [facility] Pulmonary/Critical Care Medicine. Pager: [phone]. After hours pager: [phone].

## 2015-07-13 NOTE — Procedures (Signed)
Intubation Procedure Note FIRAS REEM [id number] Oct 26, 1940  Procedure: Intubation Indications: Respiratory insufficiency  Procedure Details Consent: Risks of procedure as well as the alternatives and risks of each were explained to the (patient/caregiver).  Consent for procedure obtained. Time Out: Verified patient identification, verified procedure, site/side was marked, verified correct patient position, special equipment/implants available, medications/allergies/relevent history reviewed, required imaging and test results available.  Performed  Maximum sterile technique was used including gloves, hand hygiene and mask.  MAC    Evaluation Hemodynamic Status: BP stable throughout; O2 sats: stable throughout Patient's Current Condition: stable Complications: No apparent complications Patient did tolerate procedure well. Chest X-ray ordered to verify placement.  CXR: pending.   Jennet Maduro 07/13/2015

## 2015-07-14 ENCOUNTER — Inpatient Hospital Stay (HOSPITAL_COMMUNITY): Payer: Medicare HMO

## 2015-07-14 LAB — COMPREHENSIVE METABOLIC PANEL
ALK PHOS: 51 U/L (ref 38–126)
ALT: 27 U/L (ref 17–63)
ANION GAP: 11 (ref 5–15)
AST: 21 U/L (ref 15–41)
Albumin: 2.7 g/dL — ABNORMAL LOW (ref 3.5–5.0)
BILIRUBIN TOTAL: 0.6 mg/dL (ref 0.3–1.2)
BUN: 15 mg/dL (ref 6–20)
CALCIUM: 8.8 mg/dL — AB (ref 8.9–10.3)
CO2: 28 mmol/L (ref 22–32)
Chloride: 103 mmol/L (ref 101–111)
Creatinine, Ser: 1.26 mg/dL — ABNORMAL HIGH (ref 0.61–1.24)
GFR calc non Af Amer: 54 mL/min — ABNORMAL LOW (ref >60)
Glucose, Bld: 128 mg/dL — ABNORMAL HIGH (ref 65–99)
Potassium: 3.8 mmol/L (ref 3.5–5.1)
SODIUM: 142 mmol/L (ref 135–145)
TOTAL PROTEIN: 6.5 g/dL (ref 6.5–8.1)

## 2015-07-14 LAB — BLOOD GAS, ARTERIAL
Acid-Base Excess: 0.9 mmol/L (ref 0.0–2.0)
Bicarbonate: 24.4 mEq/L — ABNORMAL HIGH (ref 20.0–24.0)
DRAWN BY: 252031
FIO2: 0.6
MECHVT: 530 mL
O2 SAT: 99.3 %
PEEP: 5 cmH2O
PH ART: 7.462 — AB (ref 7.350–7.450)
Patient temperature: 98.6
RATE: 14 resp/min
TCO2: 25.4 mmol/L (ref 0–100)
pCO2 arterial: 34.6 mmHg — ABNORMAL LOW (ref 35.0–45.0)
pO2, Arterial: 186 mmHg — ABNORMAL HIGH (ref 80.0–100.0)

## 2015-07-14 LAB — GLUCOSE, CAPILLARY
GLUCOSE-CAPILLARY: 115 mg/dL — AB (ref 65–99)
GLUCOSE-CAPILLARY: 128 mg/dL — AB (ref 65–99)
GLUCOSE-CAPILLARY: 137 mg/dL — AB (ref 65–99)
Glucose-Capillary: 103 mg/dL — ABNORMAL HIGH (ref 65–99)
Glucose-Capillary: 127 mg/dL — ABNORMAL HIGH (ref 65–99)
Glucose-Capillary: 115 mg/dL — ABNORMAL HIGH (ref 65–99)

## 2015-07-14 LAB — CBC
HCT: 42.2 % (ref 39.0–52.0)
HEMOGLOBIN: 13.8 g/dL (ref 13.0–17.0)
MCH: 31.4 pg (ref 26.0–34.0)
MCHC: 32.7 g/dL (ref 30.0–36.0)
MCV: 96.1 fL (ref 78.0–100.0)
Platelets: 200 10*3/uL (ref 150–400)
RBC: 4.39 MIL/uL (ref 4.22–5.81)
RDW: 13 % (ref 11.5–15.5)
WBC: 14 10*3/uL — AB (ref 4.0–10.5)

## 2015-07-14 LAB — MAGNESIUM: MAGNESIUM: 2 mg/dL (ref 1.7–2.4)

## 2015-07-14 LAB — PHOSPHORUS
PHOSPHORUS: 3.3 mg/dL (ref 2.5–4.6)
Phosphorus: 3.8 mg/dL (ref 2.5–4.6)

## 2015-07-14 LAB — PROCALCITONIN: PROCALCITONIN: 0.32 ng/mL

## 2015-07-14 MED ORDER — LABETALOL HCL 100 MG PO TABS
50.0000 mg | ORAL_TABLET | Freq: Two times a day (BID) | ORAL | Status: DC
  Administered 2015-07-14 – 2015-07-18 (×8): 50 mg
  Filled 2015-07-14 (×3): qty 0.5
  Filled 2015-07-14: qty 1
  Filled 2015-07-14 (×7): qty 0.5

## 2015-07-14 MED ORDER — INSULIN ASPART 100 UNIT/ML ~~LOC~~ SOLN
0.0000 [IU] | SUBCUTANEOUS | Status: DC
  Administered 2015-07-17 – 2015-07-21 (×4): 1 [IU] via SUBCUTANEOUS
  Administered 2015-07-22: 2 [IU] via SUBCUTANEOUS
  Administered 2015-07-22 – 2015-07-24 (×6): 1 [IU] via SUBCUTANEOUS

## 2015-07-14 MED ORDER — INSULIN ASPART 100 UNIT/ML ~~LOC~~ SOLN: 0.0000 [IU] | SUBCUTANEOUS | Status: DC

## 2015-07-14 MED ORDER — HYDRALAZINE HCL 20 MG/ML IJ SOLN
10.0000 mg | Freq: Four times a day (QID) | INTRAMUSCULAR | Status: DC | PRN
  Administered 2015-07-14 – 2015-07-16 (×2): 10 mg via INTRAVENOUS
  Filled 2015-07-14 (×2): qty 1

## 2015-07-14 MED ORDER — VITAL AF 1.2 CAL PO LIQD
1000.0000 mL | ORAL | Status: DC
  Administered 2015-07-14 – 2015-07-15 (×2): 1000 mL
  Filled 2015-07-14 (×3): qty 1000

## 2015-07-14 MED ORDER — PRO-STAT SUGAR FREE PO LIQD
30.0000 mL | Freq: Every day | ORAL | Status: DC
  Administered 2015-07-14 – 2015-07-19 (×5): 30 mL
  Filled 2015-07-14 (×6): qty 30

## 2015-07-14 NOTE — Progress Notes (Signed)
Spoke to [facility] in MRI approx 1-1-1/2 hours ago, scheduled pt for MRI at 1600. RT notified RN that there were no available therapist at that time. At [place] called MRI and scheduled the patient for 1730.

## 2015-07-14 NOTE — Progress Notes (Signed)
Nutrition Follow-up / Consult  DOCUMENTATION CODES:   Non-severe (moderate) malnutrition in context of chronic illness  INTERVENTION:    Initiate TF via OGT with Vital AF 1.2 at 25 ml/h and Prostat 30 ml once daily on day 1; on day 2, increase to goal rate of 55 ml/h (1320 ml per day) to provide 1684 kcals, 114 gm protein, 1071 ml free water daily.  NUTRITION DIAGNOSIS:   Malnutrition related to chronic illness as evidenced by mild depletion of body fat, moderate depletion of body fat, moderate depletions of muscle mass, severe depletion of muscle mass, mild depletion of muscle mass.  Ongoing  GOAL:   Patient will meet greater than or equal to 90% of their needs  Unmet  MONITOR:   Vent status, Labs, Weight trends, TF tolerance, I & O's  REASON FOR ASSESSMENT:   Consult Enteral/tube feeding initiation and management  ASSESSMENT:   75 y.o. male with PMH as outlined below including uncontrolled hypertension for which he has had multiple admissions. He was brought to [facility] ED 04/19 evening by his wife with AMS, garbled speech, vomiting. Found to have new LLL infiltrates, likely due to aspiration PNA.   Discussed patient in ICU rounds and with RN today. Patient was extubated on 4/21, re-intubated on 4/25. Received MD Consult for TF initiation and management. Patient is currently intubated on ventilator support MV: 8.6 L/min Temp (24hrs), Avg:98.5 F (36.9 C), Min:97.5 F (36.4 C), Max:99.6 F (37.6 C)  Propofol: 2.1 ml/hr providing 55 kcals per day  Diet Order:   NPO  Skin:  Reviewed, no issues  Last BM:  4/26  Height:   Ht Readings from Last 1 Encounters:  07/13/15 5\' 7"  (1.702 m)    Weight:   Wt Readings from Last 1 Encounters:  07/14/15 150 lb 9.2 oz (68.3 kg)    Ideal Body Weight:  80.9 kg  BMI:  Body mass index is 23.58 kg/(m^2).  Estimated Nutritional Needs:   Kcal:  [id number]  Protein:  >/= 100 gm  Fluid:  1.8 L  EDUCATION NEEDS:   No  education needs identified at this time  Molli Barrows, [place], [place], [place] Pager [phone] After Hours Pager [phone]

## 2015-07-14 NOTE — Progress Notes (Signed)
EEG completed; results pending.    

## 2015-07-14 NOTE — Progress Notes (Signed)
Interval history: Benjamin Park is an 75 y.o. male patient with baseline Parkinson's disease, dementia, multiple prior ER visits for altered mental status in the setting of severe hypertension , was brought in by his wife to the emergency room for worsening hypertension, confusion and altered mental status. She reported that she had seen similar symptoms on multiple occasions over the past 10+ years all of which are in the setting of severe hypertension, some of them required intubation, was noted to have subarachnoid hemorrhage in the past with one of these episodes. December 2016 with similar presentation, noted to have a tiny embolic infarct in the parietal vertex, incidental finding. On aspirin and Plavix at home. He was brought to ED on 4/19 for AMS in setting of sever HTN with SBP 213. He was administered  Nicardipine and CT head showed no acute process. . While in ED he was noted to be poorly responsive foaming at the mouth blood pressure started to trend down as low as 90s over 60s. Repeat ABG showing hypoxia but no evidence of hypercarbia chest x-ray showing new aspiration pneumonia. He was intubated for airway protection. Repeat CXR showed new LLL infiltrate, likely due to aspiration PNA.  MRI obtained 4/20 and shoed no acute infract.  4/21 EEG obtained showing no epileptiform activity. Extubated 4/21. Noted to be confused during that day. 4/23 was brought to normal floor and per note was talking and able to tell practitioner he was feeling better. BP was remaining asystolically,ly [id number]. He had finished two days of Vancomycin and was on Zosyn for PNA. Plan was to D/C home.  4/25 "prior to leaving home RN paged that he started to have decreased oxygen saturations. Patient evaluated bedside 4pm, it appears that he aspirated and is requiring nasal cannula. He initially appeared to be mentating well with equal strength, however became progressively lethargic as his blood pressure started began to  increase. Blood pressure started to rise [id number] systolic requiring 10 mg IV hydralazine. STAT EKG with sinus tachycardia. Received 40 mg IV Lasix, STAT CXR. Progressively becoming more altered, concern now for airway protection, called PCCM for critical care consult and will move to ICU as he may need intubation. Will obtain ABG. He appears weak on left side, has a history of ICH, obtain CT brain. Patient becoming more somnolent - left leg with movement to pain - left arm remains without movement - respirations more shallow - will cough with stimulation but not to command as earlier - concern now for airway - condom catheter placed - 1642: 159/62 - HR 107 RR 28 shallow - O2 sats 91% on nasal cannula - placed on NRB mask - per RRN. He was re intubated.      Subjective: Patient is intubated opens eyes to sternal rub and localizes with right arm. HE is on Propofol currently  Exam: Filed Vitals:   07/14/15 0930 07/14/15 1107  BP: 139/94   Pulse: 108   Temp:  98.7 F (37.1 C)  Resp: 23     HEENT-  Normocephalic, no lesions, without obvious abnormality.  Normal external eye and conjunctiva.  Normal TM's bilaterally.  Normal auditory canals and external ears. Normal external nose, mucus membranes and septum.  Normal pharynx. Cardiovascular- S1, S2 normal, pulses palpable throughout   Lungs- chest clear, no wheezing, rales, normal symmetric air entry Abdomen- normal findings: bowel sounds normal Extremities- no edema Lymph-no adenopathy palpable Musculoskeletal-no joint tenderness, deformity or swelling Skin-warm and dry, no hyperpigmentation, vitiligo, or suspicious lesions  Gen: In bed, NAD MS: opens eyes to sternal rub and localizes with right arm. Does not follow commands. On Propofol CN: PERRLA, doll's intact, corneal intact, no blink to threat. Face symmetric.  Motor: moving bilateral legs and right arm antigravity. Left arm flaccid Sensory: withdraws from pain in bilateral leg but  not either arm.  DTR:2+ throughout  Pertinent Labs: none  Etta Quill PA-C [facility] [phone]  Impression: 75 YO male admitted to hospital with AMS in setting of HTN and then found to have PNA requiring intubation for airway protection. Just prior to discharge became hypertensive, somnolent and needed intubation for airway compromise. In addition he was found to have new left arm flaccidity.   I am concerned that he did have a new stroke, though worsening of underlying deficits in the setting of acute illness is also possible.   Recommendations: 1) MRI brain 2) EEG 3) will follow.   Roland Rack, MD [facility] [phone]  If 7pm- 7am, please page neurology on call as listed in [place].  07/14/2015, 11:35 AM

## 2015-07-14 NOTE — Procedures (Signed)
History: 75 yo M with altered mental status and left arm weakness after hypertension.   Sedation: Propofol  Technique: This is a 21 channel routine scalp EEG performed at the bedside with bipolar and monopolar montages arranged in accordance to the international 10/20 system of electrode placement. One channel was dedicated to EKG recording.    Background: There is prominent generalized irregular delta and theta activity. A Posterior dominant Rhythm that is poorly sustained is seen at times with a frequency of 7 - 8Hz . There are also moderately frequent triphasic waves.   Photic stimulation: Physiologic driving is not performed  EEG Abnormalities: 1) triphasic waves 2) generalized irregular delta activity.   Clinical Interpretation: This EEG is consistent with a moderate generalized non-specific cerebral dysfunction(encephalopathy). Though non-specific, triphasic waves are often  assocaited with toxic/metabolic encephalopathies. There was no seizure or seizure predisposition recorded on this study. Please note that a normal EEG does not preclude the possibility of epilepsy.   Roland Rack, MD [facility] [phone]  If 7pm- 7am, please page neurology on call as listed in [place].

## 2015-07-14 NOTE — Progress Notes (Signed)
PULMONARY / CRITICAL CARE MEDICINE   Name: [name] MRN: [id number] DOB: 11/23/1940    ADMISSION DATE:  06/22/2015 CONSULTATION DATE:  07/08/15  REFERRING MD:  EDP  CHIEF COMPLAINT:  AMS  HISTORY OF PRESENT ILLNESS:  Pt is encephelopathic; therefore, this HPI is obtained from chart review. Benjamin Park is a 75 y.o. male with PMH as outlined below including uncontrolled hypertension for which he has had multiple admissions.  He was brought to [facility] ED 04/19 evening by his wife who informed staff that he was having a stroke due to the fact that he was unable to follow commands and had gargled speech.  He has hx of hemorrhagic stroke and apparently presented very similarly at the time.  Wife stated that he never had any focal deficits, only gargled speech and inability follow commands.   In ED, he had elevated BP's into the low 200's.  He was started on nicardipine and after only 3 - 5 minutes, BP dropped into low 100's per ED RN.  He was evaluated by neurology and initial head CT was negative for acute infarct or hemorrhage. While in ED, he had sudden change in mental status to the point where he became minimally responsive. CXR showed new LLL infiltrate, likely due to aspiration PNA.  He was intubated and admitted to the ICU. BP and mental status improved. He was transferred to the floor on 4/21. He has been doing well on the floor until 4/25 afternoon when preparing for discharge. He had desaturations on his O2 stats and concerns for aspiration. Mental status began to decline and he became lethargic and BP climbed to the [id number] systolic. He required intubation for airway protection and transferred to the ICU.   SUBJECTIVE: Rapid response called with patient with acute change in mental status and respiratory failure.  VITAL SIGNS: BP 89/43 mmHg  Pulse 107  Temp(Src) 99.6 F (37.6 C) (Oral)  Resp 20  Ht 5\' 7"  (1.702 m)  Wt 150 lb 9.2 oz (68.3 kg)  BMI 23.58 kg/m2  SpO2  100%  HEMODYNAMICS:    VENTILATOR SETTINGS: Vent Mode:  [-] PSV;CPAP FiO2 (%):  [50 %-100 %] 50 % Set Rate:  [14 bmp-16 bmp] 14 bmp Vt Set:  [530 mL] 530 mL PEEP:  [5 cmH20] 5 cmH20 Pressure Support:  [5 cmH20] 5 cmH20 Plateau Pressure:  [11 cmH20-15 cmH20] 14 cmH20  INTAKE / OUTPUT: I/O last 3 completed shifts: In: 3307.5 [P.O.:480; I.V.:2217.5; Other:60; IV Piggyback:550] Out: 3000 [Urine:3000]  PHYSICAL EXAMINATION: General: Elderly male, chronically ill appearing, in NAD. Neuro: Alert, following commands, gen weakness  HEENT: [place]/AT. PERRL, sclerae anicteric. Cardiovascular: RRR, no M/R/G.  Lungs: Respirations even and unlabored.  CTA bilaterally, No W/R/R. Abdomen: BS x 4, soft, NT/ND.  Musculoskeletal: No gross deformities, no edema.  Skin: Intact, warm, no rashes.  LABS:  BMET  Recent Labs Lab 07/12/15 0400 07/13/15 0725 07/14/15 0243  NA 143 141 142  K 2.9* 3.4* 3.8  CL 107 107 103  CO2 25 25 28   BUN 13 10 15   CREATININE 0.99 1.05 1.26*  GLUCOSE 98 100* 128*   Electrolytes  Recent Labs Lab 07/10/15 0300 07/10/15 1341  07/12/15 0400 07/13/15 0725 07/14/15 0243  CALCIUM 8.4*  --   < > 8.3* 8.5* 8.8*  MG 2.1 2.0  --   --   --  2.0  PHOS 1.1*  --   < > 2.7 2.9 3.8  < > = values in this interval  not displayed.  CBC  Recent Labs Lab 07/11/15 0315 07/12/15 0400 07/14/15 0243  WBC 8.8 5.9 14.0*  HGB 12.0* 11.4* 13.8  HCT 36.0* 34.8* 42.2  PLT 153 158 200   Coag's  Recent Labs Lab 06/25/2015 1843  APTT 32  INR 1.15   Sepsis Markers  Recent Labs Lab 07/08/15 0105 07/08/15 0120 07/09/15 0505 07/10/15 0300  LATICACIDVEN 2.0 1.90  --   --   PROCALCITON 0.55  --  2.36 1.31   ABG  Recent Labs Lab 07/13/15 1655 07/13/15 1830 07/14/15 0358  PHART 7.496* 7.458* 7.462*  PCO2ART 33.1* 37.0 34.6*  PO2ART 54.0* 287* 186*   Liver Enzymes  Recent Labs Lab 06/22/2015 1843 07/14/15 0243  AST 21 21  ALT 20 27  ALKPHOS 77 51   BILITOT 0.8 0.6  ALBUMIN 3.9 2.7*   Cardiac Enzymes  Recent Labs Lab 07/08/15 0105  TROPONINI 0.05*   Glucose  Recent Labs Lab 07/13/15 1136 07/13/15 1624 07/13/15 1736 07/14/15 0028 07/14/15 0420 07/14/15 0812  GLUCAP 107* 85 117* 128* 103* 127*   Imaging Ct Head Wo Contrast  07/13/2015  CLINICAL DATA:  Altered mental status EXAM: CT HEAD WITHOUT CONTRAST TECHNIQUE: Contiguous axial images were obtained from the base of the skull through the vertex without intravenous contrast. COMPARISON:  07/08/2015 FINDINGS: Bony calvarium is intact. Diffuse atrophic changes are noted as well as chronic white matter ischemic change. The overall appearance is similar to that seen on recent MRI examination. No acute hemorrhage, acute infarction or space-occupying mass lesion is seen IMPRESSION: Chronic atrophic and ischemic changes are noted. No acute abnormality seen. Electronically Signed   By: Inez Catalina M.D.   On: 07/13/2015 19:21   Dg Chest Port 1 View  07/14/2015  CLINICAL DATA:  Acute respiratory failure EXAM: PORTABLE CHEST 1 VIEW COMPARISON:  07/13/15 FINDINGS: Endotracheal tube and nasogastric catheter are again identified and stable in appearance. Patchy changes are again noted in the bases bilaterally left greater than right. No new focal abnormality is seen. IMPRESSION: Stable appearance when compared with the prior exam. Electronically Signed   By: Inez Catalina M.D.   On: 07/14/2015 07:38   Dg Chest Port 1 View  07/13/2015  CLINICAL DATA:  Check endotracheal tube placement EXAM: PORTABLE CHEST 1 VIEW COMPARISON:  07/11/2015 FINDINGS: Cardiac shadow is within normal limits. The lungs are well aerated bilaterally. Patchy infiltrates are again seen particularly in the left mid lung and left base. Some suggestion of early infiltrate is noted in the right medial lung base. A nasogastric catheter is seen in the stomach. The endotracheal tube lies in the trachea approximately 4 cm above the  carina. No bony abnormality is seen. IMPRESSION: Endotracheal tube and nasogastric catheter as described. Patchy infiltrative changes left greater than right. Electronically Signed   By: Inez Catalina M.D.   On: 07/13/2015 18:40   STUDIES:  CT head 04/19 > no acute finding.  Chronic small vessel ischemic changes. CXR 04/20 > focal consolidation LLL and developing consolidation RUL. Brain MRI 04/20 > no acute finding. Advanced atrophy and diffuse white matter disease b/l.  Brain MRI 4/26 >  CULTURES: Blood 04/20 > NG <24 hours Sputum 04/20 >   ANTIBIOTICS: Zosyn 04/20 >>> Vanc 4/20>>>4/22, restarted 4/25>>  SIGNIFICANT EVENTS: 04/20 > admitted with hypertensive urgency, AMS, ? PRES, ? CVA, aspiration PNA, acute hypoxic respiratory failure. 4/25 > AMS, HTN urgency, new left arm weakness  LINES/TUBES: ETT 04/20 >4/21  DISCUSSION: 75 y.o. M  with hx uncontrolled HTN and previous hemorrhagic stroke, admitted 04/20 with hypertensive crisis.  While in ED, had worsening mental status along with hypoxic respiratory failure, likely due to aspiration PNA.  He required intubation and PCCM was called to admit to ICU.  Brain MRI pending to rule out CVA and further evaluate for PRES.  ASSESSMENT / PLAN:  NEUROLOGIC A:   Acute encephalopathy - unclear etiology at this point but likely exacerbated by hypertension.  Initial head CT negative for acute infarct or hemorrhage; however, history and exam still concerning for underlying ischemic stroke. Consider PRES given hx of severe uncontrolled HTN. Hx TIA, CVA, SAH, hypertensive encephalopathy, headache. Evidently every time the patient's SBP increases above 200 his mental status decompensates and takes days to recover vs acute aspiration (did not do well during swallow evaluation earlier). P:   Head CT without contrast today to r/o hemorrhage. Neurology following, appreciate the assistance. BP control - restart labetalol 50 bid MRI  brain  CARDIOVASCULAR A:  Hypertensive urgency/emergency - resolved after cardene gtt started; cardene now off. Septic shock>resolved , off pressors 4/22  Hx HTN, HLD, MI. 4/22>b/p tr up  P:  Continue outpatient ASA, lovastatin. MAP goal is 25% -30% reduction from highest MAP to allow autoregulation, goal MAP 80-110 Weaning propofol Cortisol > 100, no role stress steroids Continue current anti-HTN.  PULMONARY A: Acute hypoxic respiratory failure - likely due to aspiration.>extubaed 4/21  Probable aspiration PNA. Hx OSA - on CPAP. P:   Intubated Full mechanical support. ABG and CXR post intubation and in AM.  Abx/cultures per ID section. Titrate O2 for sat of 88-92%.  RENAL A:   Hypokalemia - resolved HypoMg HypoPhos Hyponatremia cvp low P:   KVO IVF. BMP in AM. Replace electrolytes as indicated.  GASTROINTESTINAL A:   GI prophylaxis. Nutrition. Hx dysphagia - has apparently failed swallow evals in the past and refuses to use thickening solution. -4/22 ST eval with D2 diet rec  P:   SUP: Pantoprazole. TF per nutrition.   HEMATOLOGIC A:   VTE Prophylaxis. P:  SCD's / heparin. CBC in AM.  INFECTIOUS A:   Probable aspiration PNA. Septic shock-resolved. P:   Continue Zosyn. Vancomycin per pharmacy. UA negative. Follow cultures. PCT   ENDOCRINE A:   Hypothyroidism. P:   Continue outpatient synthroid   TSH wnl  Family updated: Wife updated.  Interdisciplinary Family Meeting v Palliative Care Meeting:  Due by: 07/14/15  Maryellen Pile, MD IMTS PGY-1 [phone]

## 2015-07-14 NOTE — Progress Notes (Signed)
RT transported patient from 54M to CT and back without any complications.

## 2015-07-15 ENCOUNTER — Inpatient Hospital Stay (HOSPITAL_COMMUNITY): Payer: Medicare HMO

## 2015-07-15 DIAGNOSIS — G4730 Sleep apnea, unspecified: Secondary | ICD-10-CM

## 2015-07-15 DIAGNOSIS — I639 Cerebral infarction, unspecified: Secondary | ICD-10-CM

## 2015-07-15 DIAGNOSIS — Z8774 Personal history of (corrected) congenital malformations of heart and circulatory system: Secondary | ICD-10-CM

## 2015-07-15 DIAGNOSIS — Z9889 Other specified postprocedural states: Secondary | ICD-10-CM

## 2015-07-15 DIAGNOSIS — I6340 Cerebral infarction due to embolism of unspecified cerebral artery: Secondary | ICD-10-CM

## 2015-07-15 DIAGNOSIS — J9600 Acute respiratory failure, unspecified whether with hypoxia or hypercapnia: Secondary | ICD-10-CM

## 2015-07-15 LAB — BASIC METABOLIC PANEL
ANION GAP: 9 (ref 5–15)
BUN: 17 mg/dL (ref 6–20)
CO2: 25 mmol/L (ref 22–32)
Calcium: 8.4 mg/dL — ABNORMAL LOW (ref 8.9–10.3)
Chloride: 105 mmol/L (ref 101–111)
Creatinine, Ser: 0.94 mg/dL (ref 0.61–1.24)
GFR calc Af Amer: >60 mL/min (ref >60)
GFR calc non Af Amer: >60 mL/min (ref >60)
GLUCOSE: 127 mg/dL — AB (ref 65–99)
POTASSIUM: 3.4 mmol/L — AB (ref 3.5–5.1)
Sodium: 139 mmol/L (ref 135–145)

## 2015-07-15 LAB — GLUCOSE, CAPILLARY
GLUCOSE-CAPILLARY: 105 mg/dL — AB (ref 65–99)
Glucose-Capillary: 102 mg/dL — ABNORMAL HIGH (ref 65–99)
Glucose-Capillary: 103 mg/dL — ABNORMAL HIGH (ref 65–99)
Glucose-Capillary: 110 mg/dL — ABNORMAL HIGH (ref 65–99)
Glucose-Capillary: 115 mg/dL — ABNORMAL HIGH (ref 65–99)
Glucose-Capillary: 118 mg/dL — ABNORMAL HIGH (ref 65–99)

## 2015-07-15 LAB — CBC
HEMATOCRIT: 39.6 % (ref 39.0–52.0)
HEMOGLOBIN: 13.1 g/dL (ref 13.0–17.0)
MCH: 31.9 pg (ref 26.0–34.0)
MCHC: 33.1 g/dL (ref 30.0–36.0)
MCV: 96.4 fL (ref 78.0–100.0)
Platelets: 195 10*3/uL (ref 150–400)
RBC: 4.11 MIL/uL — ABNORMAL LOW (ref 4.22–5.81)
RDW: 13.2 % (ref 11.5–15.5)
WBC: 8.7 10*3/uL (ref 4.0–10.5)

## 2015-07-15 LAB — TROPONIN I
TROPONIN I: 0.03 ng/mL (ref <0.031)
Troponin I: 0.03 ng/mL (ref <0.031)
Troponin I: <0.03 ng/mL (ref <0.031)

## 2015-07-15 LAB — PHOSPHORUS
PHOSPHORUS: 2 mg/dL — AB (ref 2.5–4.6)
Phosphorus: 3 mg/dL (ref 2.5–4.6)

## 2015-07-15 LAB — PROCALCITONIN: PROCALCITONIN: 0.27 ng/mL

## 2015-07-15 MED ORDER — IOPAMIDOL (ISOVUE-370) INJECTION 76%
INTRAVENOUS | Status: AC
  Administered 2015-07-15: 50 mL
  Filled 2015-07-15: qty 50

## 2015-07-15 MED ORDER — POTASSIUM CHLORIDE 20 MEQ/15ML (10%) PO SOLN
40.0000 meq | Freq: Once | ORAL | Status: AC
  Administered 2015-07-15: 40 meq via ORAL
  Filled 2015-07-15: qty 30

## 2015-07-15 NOTE — Progress Notes (Signed)
RT Note: Pt transported to CT & back to unit with no complications. RT will continue to monitor. 

## 2015-07-15 NOTE — Progress Notes (Addendum)
STROKE TEAM PROGRESS NOTE   HISTORY OF PRESENT ILLNESS prior to Stroke consultation Benjamin Park is an 75 y.o. male patient with baseline Parkinson's disease, dementia, multiple prior ER visits for altered mental status in the setting of severe hypertension , was brought in by his wife to the emergency room for worsening hypertension, confusion and altered mental status. She reported that she had seen similar symptoms on multiple occasions over the past 10+ years all of which are in the setting of severe hypertension, some of them required intubation, was noted to have subarachnoid hemorrhage in the past with one of these episodes. December 2016 with similar presentation, noted to have a tiny embolic infarct in the parietal vertex, incidental finding. On aspirin and Plavix at home. He was brought to ED on 4/19 for AMS in setting of sever HTN with SBP 213. He was administered Nicardipine and CT head showed no acute process. . While in ED he was noted to be poorly responsive foaming at the mouth blood pressure started to trend down as low as 90s over 60s. Repeat ABG showing hypoxia but no evidence of hypercarbia chest x-ray showing new aspiration pneumonia. He was intubated for airway protection. Repeat CXR showed new LLL infiltrate, likely due to aspiration PNA. MRI obtained 4/20 and shoed no acute infract. 4/21 EEG obtained showing no epileptiform activity. Extubated 4/21. Noted to be confused during that day. 4/23 was brought to normal floor and per note was talking and able to tell practitioner he was feeling better. BP was remaining asystolically,ly [id number]. He had finished two days of Vancomycin and was on Zosyn for PNA. Plan was to D/C home.  4/25 "prior to leaving home RN paged that he started to have decreased oxygen saturations. Patient evaluated bedside 4pm, it appears that he aspirated and is requiring nasal cannula. He initially appeared to be mentating well with equal strength, however became  progressively lethargic as his blood pressure started began to increase. Blood pressure started to rise [id number] systolic requiring 10 mg IV hydralazine. STAT EKG with sinus tachycardia. Received 40 mg IV Lasix, STAT CXR. Progressively becoming more altered, concern now for airway protection, called PCCM for critical care consult and will move to ICU as he may need intubation. Will obtain ABG. He appears weak on left side, has a history of ICH, obtain CT brain. Patient becoming more somnolent - left leg with movement to pain - left arm remains without movement - respirations more shallow - will cough with stimulation but not to command as earlier - concern now for airway - condom catheter placed - 1642: 159/62 - HR 107 RR 28 shallow - O2 sats 91% on nasal cannula - placed on NRB mask - per RRN. He was re intubated.    SUBJECTIVE (INTERVAL HISTORY) His daughter is at the bedside.  Overall he feels his condition is rapidly improving. He still intubated but eyes open, awake alert, following commands. BP in good control. Still has left hemiplegia.    OBJECTIVE Temp:  [98.3 F (36.8 C)-99 F (37.2 C)] 98.8 F (37.1 C) (04/27 0846) Pulse Rate:  [84-111] 104 (04/27 0850) Cardiac Rhythm:  [-] Normal sinus rhythm (04/27 0400) Resp:  [14-22] 20 (04/27 0850) BP: (87-164)/(58-106) 164/106 mmHg (04/27 0850) SpO2:  [94 %-100 %] 98 % (04/27 0850) FiO2 (%):  [40 %-50 %] 40 % (04/27 0850) Weight:  [70.4 kg (155 lb 3.3 oz)] 70.4 kg (155 lb 3.3 oz) (04/27 0500)  CBC:  Recent Labs Lab 07/14/15  0243 07/15/15 0758  WBC 14.0* 8.7  HGB 13.8 13.1  HCT 42.2 39.6  MCV 96.1 96.4  PLT 200 0000000    Basic Metabolic Panel:  Recent Labs Lab 07/10/15 1341  07/14/15 0243 07/14/15 1427 07/15/15 0138 07/15/15 0758  NA  --   < > 142  --   --  139  K  --   < > 3.8  --   --  3.4*  CL  --   < > 103  --   --  105  CO2  --   < > 28  --   --  25  GLUCOSE  --   < > 128*  --   --  127*  BUN  --   < > 15  --   --  17   CREATININE  --   < > 1.26*  --   --  0.94  CALCIUM  --   < > 8.8*  --   --  8.4*  MG 2.0  --  2.0  --   --   --   PHOS  --   < > 3.8 3.3 3.0  --   < > = values in this interval not displayed.  Lipid Panel:    Component Value Date/Time   CHOL 162 03/15/2015 0435   TRIG 110 03/15/2015 0435   HDL 44 03/15/2015 0435   CHOLHDL 3.7 03/15/2015 0435   VLDL 22 03/15/2015 0435   LDLCALC 96 03/15/2015 0435   HgbA1c:  Lab Results  Component Value Date   HGBA1C 5.5 03/15/2015   Urine Drug Screen:    Component Value Date/Time   LABOPIA NONE DETECTED 07/08/2015 0728   LABOPIA NEGATIVE 02/24/2011 1007   COCAINSCRNUR NONE DETECTED 07/08/2015 0728   COCAINSCRNUR NEGATIVE 02/24/2011 1007   LABBENZ POSITIVE* 07/08/2015 0728   LABBENZ NEGATIVE 02/24/2011 1007   AMPHETMU NONE DETECTED 07/08/2015 0728   AMPHETMU NEGATIVE 02/24/2011 1007   THCU NONE DETECTED 07/08/2015 0728   LABBARB NONE DETECTED 07/08/2015 0728      IMAGING I have personally reviewed the radiological images below and agree with the radiology interpretations. Blue text is my interpretation.  Ct Head Wo Contrast  07/13/2015  IMPRESSION: Chronic atrophic and ischemic changes are noted. No acute abnormality seen.   Mr Brain Wo Contrast  07/14/2015  IMPRESSION: Newly seen extensive areas of restricted diffusion and brain edema in a patchy pattern primarily at the vertices, right more than left. Differential diagnosis is that of an atypical manifestation of posterior reversible encephalopathy/hypertensive encephalopathy versus is embolic infarctions. Due to the history of multiple presentations in the past during episodes of severe hypertension, posterior reversible encephalopathy is favored. However, significant DWI changes with less pronounced T2 changes as well as acute neuro change more supportive of infarction.   Dg Chest Port 1 View  07/15/2015  IMPRESSION: Overall improving aeration of the lungs with residual probable left  lower lobe atelectasis.   07/14/2015  IMPRESSION: Stable appearance when compared with the prior exam.   07/13/2015 IMPRESSION: Endotracheal tube and nasogastric catheter as described. Patchy infiltrative changes left greater than right.   EEG 07/14/15 This EEG is consistent with a moderate generalized non-specific cerebral dysfunction(encephalopathy). Though non-specific, triphasic waves are often assocaited with toxic/metabolic encephalopathies. There was no seizure or seizure predisposition recorded on this study. Please note that a normal EEG does not preclude the possibility of epilepsy.   EEG 07/09/15 This sedated EEG is abnormal due to the presence  of: 1. Moderate diffuse slowing of the background 2. Additional occasional focal slowing over the right parietal region Clinical Correlation of the above findings indicates diffuse cerebral dysfunction that is non-specific in etiology and can be seen with hypoxic/ischemic injury, toxic/metabolic encephalopathies, or medication effect. Additional focal slowing over the right parietal region indicates focal cerebral dysfunction in this region suggestive of underlying structural or physiologic abnormality. The absence of epileptiform discharges does not rule out a clinical diagnosis of epilepsy. Clinical correlation is advised.  LE venous doppler - No evidence of DVT, superficial thrombosis, or Baker's Cyst  Ct Angio Head and neck W/cm &/or Wo Cm 07/15/2015  IMPRESSION: 1. Negative for emergent large vessel occlusion. Widespread atherosclerotic plaque, but no hemodynamically significant carotid, vertebral, or intracranial artery stenosis. 2. 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 3. Expected CT appearance of the superior hemispheric diffusion positive lesions seen by MRI yesterday. No associated hemorrhage or mass effect. 4. No acute findings in the neck. Endotracheal tube terminates above the carina, enteric  tube courses within the thoracic esophagus.   TTE pending   PHYSICAL EXAM  Temp:  [98.3 F (36.8 C)-99 F (37.2 C)] 98.8 F (37.1 C) (04/27 0846) Pulse Rate:  [84-111] 93 (04/27 1200) Resp:  [14-22] 14 (04/27 1200) BP: (87-164)/(58-111) 135/91 mmHg (04/27 1200) SpO2:  [94 %-100 %] 96 % (04/27 1200) FiO2 (%):  [40 %-50 %] 40 % (04/27 1104) Weight:  [155 lb 3.3 oz (70.4 kg)] 155 lb 3.3 oz (70.4 kg) (04/27 0500)  General - Well nourished, well developed, intubated but awake alert.  Ophthalmologic - Fundi not visualized due to noncooperation (keep gaging and cough with ET).  Cardiovascular - Regular rate and rhythm.  Neuro- awake alert, intubated on ventilation, follows simple central and peripheral commands. PERRL, EOMI, blinking to visual threat bilaterally. Facial difficulty to evaluate due to ET tube. RUE 5/5, LUE 0/5, RLE 3+/5 on stimulation and LLE 2/5 on pain stimulation. DTR 1+ and no babinski. Sensation and coordination or gait not tested.   ASSESSMENT/PLAN Mr. SAMANTHA PIETRZYK is a 75 y.o. male with history of baseline Parkinson's disease, dementia, multiple ER visits for altered mental status in the setting of severe hypertension admitted for worsening hypertension, confusion and altered mental status. Initiated cardene and then BP down to 90s. ABG showed hypoxia and he was intubated. MRI 07/08/15 showed no acute infarct. EEG diffuse slow on 07/09/15. Extubated same day and on zoysn for PNA and was ready for discharge on 07/14/15 when he again had hypoxia, lethargy and left sided weakness. He was again intubated for airway protection. EEG showed triphasic waves and MRI showed b/l MCA and ACA and MCA/ACA infarcts, R>L. He did not receive IV t-PA due to hx of SDH in 2011.   Stroke:  bilateral MCA, ACA, MCA/ACA infarct, embolic pattern secondary to unclear source  Resultant  Left hemiparesis  MRI  Bilateral high convexity frontal MCA, ACA, MCA/ACA infarcts  CTA head and neck -  unremarkable  EEG x 2 no seizure but triphasic wave  LE venous doppler - no DVT  2D Echo  pending  Recommend 30 day cardiac event monitoring as outpt to rule out afib  LDL pending  HgbA1c pending  Heparin subq for VTE prophylaxis  aspirin 325 mg daily prior to admission, now on aspirin 325 mg daily. Continue ASA 325 on discharge  Ongoing aggressive stroke risk factor management  Therapy recommendations:  pending  Disposition:  Pending  Hypertensive  encephalopathy  Stable now  Frequent admission for hypertensive encephalopathy  EEG showed triphasic waves  Management as per primary team  Aspiration pneumonia  CXR improved  On zosyn  Treatment as per primary team  Hyperlipidemia  No statin at home  LDL pending, goal < 70  Diabetes  HgbA1c pending, goal < 7.0  CBG in control  On SSI  Other Stroke Risk Factors  Advanced age  Hx stroke/TIA s/p PFO closure  Obstructive sleep apnea, on CPAP at home  Hx of CAD/MI  Other Active Problems  SDH in 2011  PD  Dementia   S/p PFO closure  Hospital day # 8  This patient is critically ill due to acute ischemic stroke, hypertensive emergency and encephalopathy and at significant risk of neurological worsening, death form hemorrhagic transformation, recurrent stroke, stroke extension, heart failure. This patient's care requires constant monitoring of vital signs, hemodynamics, respiratory and cardiac monitoring, review of multiple databases, neurological assessment, discussion with family, other specialists and medical decision making of high complexity. I spent 40 minutes of neurocritical care time in the care of this patient.   Rosalin Hawking, MD PhD Stroke Neurology 07/15/2015 6:16 PM   To contact Stroke Continuity provider, please refer to [email or web address] After hours, contact General Neurology

## 2015-07-15 NOTE — Progress Notes (Signed)
PULMONARY / CRITICAL CARE MEDICINE   Name: Benjamin Park MRN: [id number] DOB: [birth year]    ADMISSION DATE:  07/18/2015 CONSULTATION DATE:  07/08/15  REFERRING MD:  EDP  CHIEF COMPLAINT:  AMS  HISTORY OF PRESENT ILLNESS:  Pt is encephelopathic; therefore, this HPI is obtained from chart review. Benjamin Park is a 75 y.o. male with PMH as outlined below including uncontrolled hypertension for which he has had multiple admissions.  He was brought to [facility] ED 04/19 evening by his wife who informed staff that he was having a stroke due to the fact that he was unable to follow commands and had gargled speech.  He has hx of hemorrhagic stroke and apparently presented very similarly at the time.  Wife stated that he never had any focal deficits, only gargled speech and inability follow commands.   In ED, he had elevated BP's into the low 200's.  He was started on nicardipine and after only 3 - 5 minutes, BP dropped into low 100's per ED RN.  He was evaluated by neurology and initial head CT was negative for acute infarct or hemorrhage. While in ED, he had sudden change in mental status to the point where he became minimally responsive. CXR showed new LLL infiltrate, likely due to aspiration PNA.  He was intubated and admitted to the ICU. BP and mental status improved. He was transferred to the floor on 4/21. He has been doing well on the floor until 4/25 afternoon when preparing for discharge. He had desaturations on his O2 stats and concerns for aspiration. Mental status began to decline and he became lethargic and BP climbed to the [id number] systolic. He required intubation for airway protection and transferred to the ICU.   SUBJECTIVE: Intubated, minimally sedated on propofol 5. Opens eyes to voice. Not following commands.   VITAL SIGNS: BP 135/91 mmHg  Pulse 93  Temp(Src) 98.8 F (37.1 C) (Oral)  Resp 14  Ht 5\' 7"  (1.702 m)  Wt 155 lb 3.3 oz (70.4 kg)  BMI 24.30 kg/m2  SpO2  96%  HEMODYNAMICS:    VENTILATOR SETTINGS: Vent Mode:  [-] PRVC FiO2 (%):  [40 %-50 %] 40 % Set Rate:  [14 bmp] 14 bmp Vt Set:  [500 mL-530 mL] 500 mL PEEP:  [5 cmH20] 5 cmH20 Pressure Support:  [5 cmH20] 5 cmH20 Plateau Pressure:  [11 cmH20-14 cmH20] 14 cmH20  INTAKE / OUTPUT: I/O last 3 completed shifts: In: 2951.1 [I.V.:1980.2; Other:60; NG/GT:260.8; IV Piggyback:650] Out: 1310 [Urine:1310]  PHYSICAL EXAMINATION: General: Elderly male, chronically ill appearing, in NAD. Intubated. Neuro: Opens eyes to voice, no eye contact/tracking, not following commands HEENT: /AT. PERRL, sclerae anicteric. Cardiovascular: RRR, no M/R/G.  Lungs: Respirations even and unlabored.  CTA bilaterally, No W/R/R. Abdomen: BS x 4, soft, NT/ND.  Musculoskeletal: No gross deformities, no edema.  Skin: Intact, warm, no rashes.  LABS:  BMET  Recent Labs Lab 07/13/15 0725 07/14/15 0243 07/15/15 0758  NA 141 142 139  K 3.4* 3.8 3.4*  CL 107 103 105  CO2 25 28 25   BUN 10 15 17   CREATININE 1.05 1.26* 0.94  GLUCOSE 100* 128* 127*   Electrolytes  Recent Labs Lab 07/10/15 0300 07/10/15 1341  07/13/15 0725 07/14/15 0243 07/14/15 1427 07/15/15 0138 07/15/15 0758  CALCIUM 8.4*  --   < > 8.5* 8.8*  --   --  8.4*  MG 2.1 2.0  --   --  2.0  --   --   --  PHOS 1.1*  --   < > 2.9 3.8 3.3 3.0  --   < > = values in this interval not displayed.  CBC  Recent Labs Lab 07/12/15 0400 07/14/15 0243 07/15/15 0758  WBC 5.9 14.0* 8.7  HGB 11.4* 13.8 13.1  HCT 34.8* 42.2 39.6  PLT 158 200 195   Coag's No results for input(s): APTT, INR in the last 168 hours. Sepsis Markers  Recent Labs Lab 07/10/15 0300 07/14/15 1205 07/15/15 0138  PROCALCITON 1.31 0.32 0.27   ABG  Recent Labs Lab 07/13/15 1655 07/13/15 1830 07/14/15 0358  PHART 7.496* 7.458* 7.462*  PCO2ART 33.1* 37.0 34.6*  PO2ART 54.0* 287* 186*   Liver Enzymes  Recent Labs Lab 07/14/15 0243  AST 21  ALT 27   ALKPHOS 51  BILITOT 0.6  ALBUMIN 2.7*   Cardiac Enzymes  Recent Labs Lab 07/15/15 0138 07/15/15 0758  TROPONINI 0.03 0.03   Glucose  Recent Labs Lab 07/14/15 1544 07/14/15 2009 07/14/15 2359 07/15/15 0332 07/15/15 0843 07/15/15 1154  GLUCAP 137* 115* 102* 118* 115* 103*   Imaging Ct Angio Head W/cm &/or Wo Cm  07/15/2015  CLINICAL DATA:  75 year old male with widespread superior hemisphere restricted diffusion on brain MRI yesterday possibly related to emboli or severe posterior reversible encephalopathy syndrome. Initial encounter. EXAM: CT ANGIOGRAPHY HEAD AND NECK TECHNIQUE: Multidetector CT imaging of the head and neck was performed using the standard protocol during bolus administration of intravenous contrast. Multiplanar CT image reconstructions and MIPs were obtained to evaluate the vascular anatomy. Carotid stenosis measurements (when applicable) are obtained utilizing NASCET criteria, using the distal internal carotid diameter as the denominator. CONTRAST:  50 mL Isovue 370 COMPARISON:  Brain MRI 07/14/2015. Head CT 07/13/2015. Intracranial MRA 02/01/2014 cervical spine CT 04/09/2012. FINDINGS: CT HEAD Brain: Abnormal cortical hypodensity in the bilateral superior frontal gyri, and to a lesser extent parietal lobes, corresponding to the abnormal diffusion. No associated hemorrhage or mass effect. Stable underlying cerebral volume. Stable gray-white matter differentiation elsewhere since 07/13/2015. No ventriculomegaly. Calvarium and skull base: Stable, negative. Paranasal sinuses: Stable left maxillary sinus opacification. Orbits: Stable, negative. CTA NECK Skeleton: Osteopenia. Exaggerated cervical lordosis with congenital appearing bilateral C6 lamina defects. Mild C6 compression vertebral compression. The appearance is unchanged from 2014. No acute osseous abnormality identified. Other neck: Intubated and oral enteric tube in place. Endotracheal tube tip terminates above the  carina. Enteric tube courses within the thoracic esophagus. Moderate apical pulmonary scarring. Areas of centrilobular emphysema in the right upper lobe. No superior mediastinal lymphadenopathy. Diminutive or absent thyroid. Negative parapharyngeal and retropharyngeal spaces. Negative sublingual space. Atrophied or surgically absent submandibular glands. There is also degree of bilateral parotid atrophy. No cervical lymphadenopathy. Aortic arch: 3 vessel arch configuration. Moderate soft and calcified arch atherosclerosis. Right carotid system: No brachiocephalic artery stenosis despite soft plaque. No right CCA origin stenosis. Mildly tortuous right CCA. Proximal to the right carotid bifurcation there is medial soft plaque in the right CCA resulting an less than 50 % stenosis with respect to the distal vessel. This appears to be mildly ulcerated (series 501, image 70). At the right carotid bifurcation there is soft and calcified plaque but no proximal right ICA stenosis. Soft and calcified plaque distal to the right ICA bulb without stenosis. The vessel remains patent to the skullbase. Left carotid system: No left CCA origin stenosis. Soft plaque proximal to the left carotid bifurcation without stenosis. Soft and calcified plaque at the bifurcation without proximal left ICA stenosis.  The left ICA is patent to the skullbase. Vertebral arteries: No proximal right subclavian artery stenosis despite soft and calcified plaque. No definite stenosis at the right vertebral artery origin. Patent right vertebral artery to the skullbase without stenosis. Bulky soft plaque in the proximal left subclavian artery resulting an stenosis up to 65-70 % with respect to the distal vessel (series 501, image 27). The left subclavian remains patent. No stenosis at the left vertebral artery origin. Codominant vertebral arteries. No left vertebral artery stenosis to the skullbase. CTA HEAD Posterior circulation: Codominant distal vertebral  arteries are patent without stenosis. Patent vertebrobasilar junction. Normal right PICA origin. Dominant appearing left AICA. Tortuous basilar artery without stenosis. Normal SCA and PCA origins. Normal bilateral PCA branches. Anterior circulation: Both ICA siphons are patent. There is soft plaque suspected in much of the right petrous segment without hemodynamically significant stenosis. Mild calcified plaque. Mild cavernous segment tortuosity. No siphon stenosis. Normal ophthalmic artery origins. Patent carotid termini. Normal MCA origins. Normal ACA origins. The left A1 is dominant. Anterior communicating artery and visualized bilateral ACA branches are within normal limits. Left MCA M1 segment, bifurcation, and left MCA branches are within normal limits. Right MCA M1 segment, bifurcation, and right MCA branches are within normal limits. Venous sinuses: Patent. Anatomic variants: None. Delayed phase: No abnormal enhancement identified. IMPRESSION: 1. Negative for emergent large vessel occlusion. Widespread atherosclerotic plaque, but no hemodynamically significant carotid, vertebral, or intracranial artery stenosis. 2. 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 3. Expected CT appearance of the superior hemispheric diffusion positive lesions seen by MRI yesterday. No associated hemorrhage or mass effect. 4. No acute findings in the neck. Endotracheal tube terminates above the carina, enteric tube courses within the thoracic esophagus. Electronically Signed   By: Genevie Ann M.D.   On: 07/15/2015 11:36   Ct Angio Neck W/cm &/or Wo/cm  07/15/2015  CLINICAL DATA:  75 year old male with widespread superior hemisphere restricted diffusion on brain MRI yesterday possibly related to emboli or severe posterior reversible encephalopathy syndrome. Initial encounter. EXAM: CT ANGIOGRAPHY HEAD AND NECK TECHNIQUE: Multidetector CT imaging of the head and neck was performed using the  standard protocol during bolus administration of intravenous contrast. Multiplanar CT image reconstructions and MIPs were obtained to evaluate the vascular anatomy. Carotid stenosis measurements (when applicable) are obtained utilizing NASCET criteria, using the distal internal carotid diameter as the denominator. CONTRAST:  50 mL Isovue 370 COMPARISON:  Brain MRI 07/14/2015. Head CT 07/13/2015. Intracranial MRA 02/01/2014 cervical spine CT 04/09/2012. FINDINGS: CT HEAD Brain: Abnormal cortical hypodensity in the bilateral superior frontal gyri, and to a lesser extent parietal lobes, corresponding to the abnormal diffusion. No associated hemorrhage or mass effect. Stable underlying cerebral volume. Stable gray-white matter differentiation elsewhere since 07/13/2015. No ventriculomegaly. Calvarium and skull base: Stable, negative. Paranasal sinuses: Stable left maxillary sinus opacification. Orbits: Stable, negative. CTA NECK Skeleton: Osteopenia. Exaggerated cervical lordosis with congenital appearing bilateral C6 lamina defects. Mild C6 compression vertebral compression. The appearance is unchanged from 2014. No acute osseous abnormality identified. Other neck: Intubated and oral enteric tube in place. Endotracheal tube tip terminates above the carina. Enteric tube courses within the thoracic esophagus. Moderate apical pulmonary scarring. Areas of centrilobular emphysema in the right upper lobe. No superior mediastinal lymphadenopathy. Diminutive or absent thyroid. Negative parapharyngeal and retropharyngeal spaces. Negative sublingual space. Atrophied or surgically absent submandibular glands. There is also degree of bilateral parotid atrophy. No cervical lymphadenopathy. Aortic arch: 3 vessel arch configuration.  Moderate soft and calcified arch atherosclerosis. Right carotid system: No brachiocephalic artery stenosis despite soft plaque. No right CCA origin stenosis. Mildly tortuous right CCA. Proximal to the  right carotid bifurcation there is medial soft plaque in the right CCA resulting an less than 50 % stenosis with respect to the distal vessel. This appears to be mildly ulcerated (series 501, image 70). At the right carotid bifurcation there is soft and calcified plaque but no proximal right ICA stenosis. Soft and calcified plaque distal to the right ICA bulb without stenosis. The vessel remains patent to the skullbase. Left carotid system: No left CCA origin stenosis. Soft plaque proximal to the left carotid bifurcation without stenosis. Soft and calcified plaque at the bifurcation without proximal left ICA stenosis. The left ICA is patent to the skullbase. Vertebral arteries: No proximal right subclavian artery stenosis despite soft and calcified plaque. No definite stenosis at the right vertebral artery origin. Patent right vertebral artery to the skullbase without stenosis. Bulky soft plaque in the proximal left subclavian artery resulting an stenosis up to 65-70 % with respect to the distal vessel (series 501, image 27). The left subclavian remains patent. No stenosis at the left vertebral artery origin. Codominant vertebral arteries. No left vertebral artery stenosis to the skullbase. CTA HEAD Posterior circulation: Codominant distal vertebral arteries are patent without stenosis. Patent vertebrobasilar junction. Normal right PICA origin. Dominant appearing left AICA. Tortuous basilar artery without stenosis. Normal SCA and PCA origins. Normal bilateral PCA branches. Anterior circulation: Both ICA siphons are patent. There is soft plaque suspected in much of the right petrous segment without hemodynamically significant stenosis. Mild calcified plaque. Mild cavernous segment tortuosity. No siphon stenosis. Normal ophthalmic artery origins. Patent carotid termini. Normal MCA origins. Normal ACA origins. The left A1 is dominant. Anterior communicating artery and visualized bilateral ACA branches are within normal  limits. Left MCA M1 segment, bifurcation, and left MCA branches are within normal limits. Right MCA M1 segment, bifurcation, and right MCA branches are within normal limits. Venous sinuses: Patent. Anatomic variants: None. Delayed phase: No abnormal enhancement identified. IMPRESSION: 1. Negative for emergent large vessel occlusion. Widespread atherosclerotic plaque, but no hemodynamically significant carotid, vertebral, or intracranial artery stenosis. 2. 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 3. Expected CT appearance of the superior hemispheric diffusion positive lesions seen by MRI yesterday. No associated hemorrhage or mass effect. 4. No acute findings in the neck. Endotracheal tube terminates above the carina, enteric tube courses within the thoracic esophagus. Electronically Signed   By: Genevie Ann M.D.   On: 07/15/2015 11:36   Mr Brain Wo Contrast  07/14/2015  CLINICAL DATA:  Hypertension and confusion, acute onset. EXAM: MRI HEAD WITHOUT CONTRAST TECHNIQUE: Multiplanar, multiecho pulse sequences of the brain and surrounding structures were obtained without intravenous contrast. COMPARISON:  Head CT 07/13/2015. MRI 07/08/2015. Multiple previous neuro imaging studies. FINDINGS: There are multiple newly seen areas of brain cortical and subcortical edema with mild swelling associated largely with restricted diffusion. These are most prominent along the surface of the brain running from front back at the vertex, right more than left. Chronic changes of brain atrophy an old small vessel insults within the brainstem, cerebellum and cerebral hemispheric white matter are again noted. Chronic abnormality of the corpus callosum is unchanged. I do not see any evidence of hemorrhage. No mass effect. No hydrocephalus. No extra-axial collection. No pituitary mass. There are inflammatory changes affecting the left maxillary sinus. IMPRESSION: Newly seen extensive  areas of  restricted diffusion and brain edema in a patchy pattern primarily at the vertices, right more than left. Differential diagnosis is that of an atypical manifestation of posterior reversible encephalopathy/hypertensive encephalopathy versus is embolic infarctions. Due to the history of multiple presentations in the past during episodes of severe hypertension, posterior reversible encephalopathy is favored. Electronically Signed   By: Nelson Chimes M.D.   On: 07/14/2015 19:18   Dg Chest Port 1 View  07/15/2015  CLINICAL DATA:  Ventilator patient.  Acute respiratory failure. EXAM: PORTABLE CHEST 1 VIEW COMPARISON:  07/14/2015 and 07/13/2015. FINDINGS: 0721 hours. The endotracheal and nasogastric tubes appear unchanged. The heart size and mediastinal contours are stable. Cardiac septal defect occluder device noted. The overall pulmonary aeration has improved. There is residual patchy left lower lobe airspace disease. No significant pleural effusion or pneumothorax. IMPRESSION: Overall improving aeration of the lungs with residual probable left lower lobe atelectasis. Electronically Signed   By: Richardean Sale M.D.   On: 07/15/2015 07:37   STUDIES:  CT head 04/19 > no acute finding.  Chronic small vessel ischemic changes. CXR 04/20 > focal consolidation LLL and developing consolidation RUL. Brain MRI 04/20 > no acute finding. Advanced atrophy and diffuse white matter disease b/l.  Brain MRI 4/26 >  CULTURES: Blood 04/20 > NG final Sputum 04/20 > Klebsiella pneumoniae, Pseudomonas (sensitivities reported)  ANTIBIOTICS: Zosyn 04/20 >>>4/28 Vanc 4/20>>>4/22, restarted 4/25>>4/27  SIGNIFICANT EVENTS: 04/20 > admitted with hypertensive urgency, AMS, ? PRES, ? CVA, aspiration PNA, acute hypoxic respiratory failure. 4/25 > AMS, HTN urgency, new left arm weakness  LINES/TUBES: ETT 04/20 >4/21  DISCUSSION: 75 y.o. M with hx uncontrolled HTN and previous hemorrhagic stroke, admitted 04/20 with  hypertensive crisis.  While in ED, had worsening mental status along with hypoxic respiratory failure, likely due to aspiration PNA.  He required intubation and PCCM was called to admit to ICU.  Brain MRI pending to rule out CVA and further evaluate for PRES.  ASSESSMENT / PLAN:  NEUROLOGIC A:   Acute encephalopathy - unclear etiology at this point but likely exacerbated by hypertension.  Hx TIA, CVA, SAH, hypertensive encephalopathy, headache. Evidently every time the patient's SBP increases above 200 his mental status decompensates and takes days to recover vs acute aspiration (did not do well during swallow evaluation earlier). ?PRES P:   Head CT negative MRI Brain ? PRES vs embolic stroke EEG with moderate generalize non-specific cerebral dysfunction, no seizures Neurology following, appreciate the assistance. BP control - continue labetalol 50 bid CTA per neurology Dopplers - neg for DVT  CARDIOVASCULAR A:  Hypertensive urgency/emergency - resolved after cardene gtt started; cardene now off. Septic shock>resolved , off pressors 4/22  Hx HTN, HLD, MI. 4/22>b/p tr up  P:  Continue outpatient ASA, lovastatin. Off sedation Cortisol > 100, no role stress steroids Continue labetalol 50 bid Trop negative  PULMONARY A: Acute hypoxic respiratory failure - likely due to aspiration.>extubaed 4/21  Questionable aspiration PNA. Hx OSA - on CPAP. P:   Intubated - mental status not improved enough for extubation at this time Full mechanical support. Abx/cultures per ID section. Titrate O2 for sat of 88-92%. Unlikely new aspiration PNA, CXR unchanged from 4/19 aspiration event  RENAL A:   Hypokalemia - resolved HypoMg HypoPhos Hyponatremia cvp low P:   KVO IVF. BMP in AM. Replace electrolytes as indicated.  GASTROINTESTINAL A:   GI prophylaxis. Nutrition. Hx dysphagia - has apparently failed swallow evals in the past and refuses to  use thickening solution. -4/22 ST  eval with D2 diet rec  P:   SUP: Pantoprazole. TF per nutrition.   HEMATOLOGIC A:   VTE Prophylaxis. P:  SCD's / heparin. CBC in AM.  INFECTIOUS A:   Probable aspiration PNA. Septic shock-resolved. P:   Continue Zosyn today for 7 day course, stop 4/28 D/C Vancomycin  PCT <0.1   ENDOCRINE A:   Hypothyroidism. P:   Continue outpatient synthroid   TSH wnl  Family updated: Wife updated.  Interdisciplinary Family Meeting v Palliative Care Meeting:  Due by: 07/14/15  Maryellen Pile, MD IMTS PGY-1 [phone]

## 2015-07-15 NOTE — Progress Notes (Signed)
VASCULAR LAB PRELIMINARY  PRELIMINARY  PRELIMINARY  PRELIMINARY  Bilateral lower extremity venous duplex completed.     Bilateral:  No evidence of DVT, superficial thrombosis, or Baker's Cyst.    Janifer Adie, RVT, RDMS 07/15/2015, 12:51 PM

## 2015-07-15 NOTE — Progress Notes (Signed)
PT Cancellation Note  Patient Details Name: Benjamin Park MRN: [id number] DOB: Jul 29, 1940   Cancelled Treatment:    Reason Eval/Treat Not Completed: Medical issues which prohibited therapy (Pt now intubated and sedated)   Fawzi Melman 07/15/2015, 1:49 PM [facility] PT [phone]

## 2015-07-16 ENCOUNTER — Inpatient Hospital Stay (HOSPITAL_COMMUNITY): Payer: Medicare HMO

## 2015-07-16 DIAGNOSIS — I6789 Other cerebrovascular disease: Secondary | ICD-10-CM

## 2015-07-16 LAB — GLUCOSE, CAPILLARY
GLUCOSE-CAPILLARY: 105 mg/dL — AB (ref 65–99)
GLUCOSE-CAPILLARY: 93 mg/dL (ref 65–99)
GLUCOSE-CAPILLARY: 99 mg/dL (ref 65–99)
Glucose-Capillary: 97 mg/dL (ref 65–99)
Glucose-Capillary: 96 mg/dL (ref 65–99)
Glucose-Capillary: 99 mg/dL (ref 65–99)

## 2015-07-16 LAB — BASIC METABOLIC PANEL
ANION GAP: 8 (ref 5–15)
BUN: 19 mg/dL (ref 6–20)
CHLORIDE: 108 mmol/L (ref 101–111)
CO2: 27 mmol/L (ref 22–32)
CREATININE: 0.97 mg/dL (ref 0.61–1.24)
Calcium: 8.6 mg/dL — ABNORMAL LOW (ref 8.9–10.3)
GFR calc non Af Amer: >60 mL/min (ref >60)
Glucose, Bld: 107 mg/dL — ABNORMAL HIGH (ref 65–99)
POTASSIUM: 3.4 mmol/L — AB (ref 3.5–5.1)
SODIUM: 143 mmol/L (ref 135–145)

## 2015-07-16 LAB — CBC
HCT: 38 % — ABNORMAL LOW (ref 39.0–52.0)
Hemoglobin: 12.4 g/dL — ABNORMAL LOW (ref 13.0–17.0)
MCH: 32 pg (ref 26.0–34.0)
MCHC: 32.6 g/dL (ref 30.0–36.0)
MCV: 98.2 fL (ref 78.0–100.0)
PLATELETS: 223 10*3/uL (ref 150–400)
RBC: 3.87 MIL/uL — AB (ref 4.22–5.81)
RDW: 13.3 % (ref 11.5–15.5)
WBC: 5.6 10*3/uL (ref 4.0–10.5)

## 2015-07-16 LAB — LIPID PANEL
Cholesterol: 119 mg/dL (ref 0–200)
HDL: 26 mg/dL — AB (ref >40)
LDL Cholesterol: 69 mg/dL (ref 0–99)
Total CHOL/HDL Ratio: 4.6 RATIO
Triglycerides: 121 mg/dL (ref <150)
VLDL: 24 mg/dL (ref 0–40)

## 2015-07-16 LAB — PHOSPHORUS
PHOSPHORUS: 2.3 mg/dL — AB (ref 2.5–4.6)
Phosphorus: 2.7 mg/dL (ref 2.5–4.6)

## 2015-07-16 LAB — ECHOCARDIOGRAM COMPLETE
HEIGHTINCHES: 67 in
Weight: 2483.26 oz

## 2015-07-16 MED ORDER — PANTOPRAZOLE SODIUM 40 MG PO PACK
40.0000 mg | PACK | ORAL | Status: DC
  Filled 2015-07-16: qty 20

## 2015-07-16 MED ORDER — PANTOPRAZOLE SODIUM 40 MG IV SOLR
40.0000 mg | INTRAVENOUS | Status: DC
  Administered 2015-07-16 – 2015-07-23 (×8): 40 mg via INTRAVENOUS
  Filled 2015-07-16 (×8): qty 40

## 2015-07-16 MED ORDER — LABETALOL HCL 5 MG/ML IV SOLN
INTRAVENOUS | Status: AC
Start: 2015-07-16 — End: 2015-07-16
  Administered 2015-07-16: 10 mg
  Filled 2015-07-16: qty 4

## 2015-07-16 MED ORDER — CHLORHEXIDINE GLUCONATE 0.12 % MT SOLN
15.0000 mL | Freq: Two times a day (BID) | OROMUCOSAL | Status: DC
  Administered 2015-07-16 – 2015-07-25 (×18): 15 mL via OROMUCOSAL
  Filled 2015-07-16 (×11): qty 15

## 2015-07-16 MED ORDER — CETYLPYRIDINIUM CHLORIDE 0.05 % MT LIQD
7.0000 mL | Freq: Two times a day (BID) | OROMUCOSAL | Status: DC
  Administered 2015-07-16 – 2015-07-25 (×19): 7 mL via OROMUCOSAL

## 2015-07-16 MED ORDER — POTASSIUM CHLORIDE 20 MEQ/15ML (10%) PO SOLN
40.0000 meq | Freq: Once | ORAL | Status: AC
  Administered 2015-07-16: 40 meq via ORAL
  Filled 2015-07-16: qty 30

## 2015-07-16 NOTE — Progress Notes (Signed)
  Echocardiogram 2D Echocardiogram has been performed.  Darlina Sicilian M 07/16/2015, 1:42 PM

## 2015-07-16 NOTE — Procedures (Signed)
Extubation Procedure Note  Patient Details:   Name: Benjamin Park DOB: 1940/12/23 MRN: [id number]   Pt extubated to 4L [place] per MD order. Pt seemed confused & unable to talk, but otherwise tolerated well. No stridor noted, VS WNL. RT will continue to monitor.    Evaluation  O2 sats: stable throughout Complications: No apparent complications Patient did tolerate procedure well. Bilateral Breath Sounds: Diminished   No  Jetty Peeks 07/16/2015, 11:31 AM

## 2015-07-16 NOTE — Progress Notes (Signed)
PULMONARY / CRITICAL CARE MEDICINE   Name: DAVINDER MIGNEAULT MRN: [id number] DOB: Dec 06, 1940    ADMISSION DATE:  07/08/2015 CONSULTATION DATE:  07/08/15  REFERRING MD:  EDP  CHIEF COMPLAINT:  AMS  HISTORY OF PRESENT ILLNESS:  Pt is encephelopathic; therefore, this HPI is obtained from chart review. Benjamin Park is a 75 y.o. male with PMH as outlined below including uncontrolled hypertension for which he has had multiple admissions.  He was brought to [facility] ED 04/19 evening by his wife who informed staff that he was having a stroke due to the fact that he was unable to follow commands and had gargled speech.  He has hx of hemorrhagic stroke and apparently presented very similarly at the time.  Wife stated that he never had any focal deficits, only gargled speech and inability follow commands.   In ED, he had elevated BP's into the low 200's.  He was started on nicardipine and after only 3 - 5 minutes, BP dropped into low 100's per ED RN.  He was evaluated by neurology and initial head CT was negative for acute infarct or hemorrhage. While in ED, he had sudden change in mental status to the point where he became minimally responsive. CXR showed new LLL infiltrate, likely due to aspiration PNA.  He was intubated and admitted to the ICU. BP and mental status improved. He was transferred to the floor on 4/21. He has been doing well on the floor until 4/25 afternoon when preparing for discharge. He had desaturations on his O2 stats and concerns for aspiration. Mental status began to decline and he became lethargic and BP climbed to the 123456 systolic. He required intubation for airway protection and transferred to the ICU.   SUBJECTIVE: Intubated. Off sedation. Alert and following commands.   VITAL SIGNS: BP 126/86 mmHg  Pulse 86  Temp(Src) 99.4 F (37.4 C) (Oral)  Resp 19  Ht 5\' 7"  (1.702 m)  Wt 155 lb 3.3 oz (70.4 kg)  BMI 24.30 kg/m2  SpO2 98%  HEMODYNAMICS:    VENTILATOR SETTINGS: Vent  Mode:  [-] PRVC FiO2 (%):  [40 %] 40 % Set Rate:  [14 bmp] 14 bmp Vt Set:  [500 mL] 500 mL PEEP:  [5 cmH20] 5 cmH20 Pressure Support:  [5 cmH20] 5 cmH20 Plateau Pressure:  [10 cmH20-16 cmH20] 15 cmH20  INTAKE / OUTPUT: I/O last 3 completed shifts: In: 3348.8 [I.V.:1776.3; NG/GT:1172.5; IV Piggyback:400] Out: [id number] [Urine:1545]  PHYSICAL EXAMINATION: General: Elderly male, chronically ill appearing, in NAD. Intubated. Neuro: Alert, following commands. Left hemiparesis.  HEENT: [place]/AT. PERRL, sclerae anicteric. Cardiovascular: RRR, no M/R/G.  Lungs: Respirations even and unlabored.  CTA bilaterally, No W/R/R. Abdomen: BS x 4, soft, NT/ND.  Musculoskeletal: No gross deformities, no edema.  Skin: Intact, warm, no rashes.  LABS:  BMET  Recent Labs Lab 07/14/15 0243 07/15/15 0758 07/16/15 0254  NA 142 139 143  K 3.8 3.4* 3.4*  CL 103 105 108  CO2 28 25 27   BUN 15 17 19   CREATININE 1.26* 0.94 0.97  GLUCOSE 128* 127* 107*   Electrolytes  Recent Labs Lab 07/10/15 0300 07/10/15 1341  07/14/15 0243  07/15/15 0138 07/15/15 0758 07/15/15 1128 07/16/15 0002 07/16/15 0254  CALCIUM 8.4*  --   < > 8.8*  --   --  8.4*  --   --  8.6*  MG 2.1 2.0  --  2.0  --   --   --   --   --   --  PHOS 1.1*  --   < > 3.8  < > 3.0  --  2.0* 2.7  --   < > = values in this interval not displayed.  CBC  Recent Labs Lab 07/14/15 0243 07/15/15 0758 07/16/15 0254  WBC 14.0* 8.7 5.6  HGB 13.8 13.1 12.4*  HCT 42.2 39.6 38.0*  PLT 200 195 223   Coag's No results for input(s): APTT, INR in the last 168 hours. Sepsis Markers  Recent Labs Lab 07/10/15 0300 07/14/15 1205 07/15/15 0138  PROCALCITON 1.31 0.32 0.27   ABG  Recent Labs Lab 07/13/15 1655 07/13/15 1830 07/14/15 0358  PHART 7.496* 7.458* 7.462*  PCO2ART 33.1* 37.0 34.6*  PO2ART 54.0* 287* 186*   Liver Enzymes  Recent Labs Lab 07/14/15 0243  AST 21  ALT 27  ALKPHOS 51  BILITOT 0.6  ALBUMIN 2.7*   Cardiac  Enzymes  Recent Labs Lab 07/15/15 0138 07/15/15 0758 07/15/15 1128  TROPONINI 0.03 0.03 <0.03   Glucose  Recent Labs Lab 07/15/15 0843 07/15/15 1154 07/15/15 1639 07/15/15 1938 07/15/15 2344 07/16/15 0343  GLUCAP 115* 103* 110* 105* 105* 93   Imaging Ct Angio Head W/cm &/or Wo Cm  07/15/2015  CLINICAL DATA:  75 year old male with widespread superior hemisphere restricted diffusion on brain MRI yesterday possibly related to emboli or severe posterior reversible encephalopathy syndrome. Initial encounter. EXAM: CT ANGIOGRAPHY HEAD AND NECK TECHNIQUE: Multidetector CT imaging of the head and neck was performed using the standard protocol during bolus administration of intravenous contrast. Multiplanar CT image reconstructions and MIPs were obtained to evaluate the vascular anatomy. Carotid stenosis measurements (when applicable) are obtained utilizing NASCET criteria, using the distal internal carotid diameter as the denominator. CONTRAST:  50 mL Isovue 370 COMPARISON:  Brain MRI 07/14/2015. Head CT 07/13/2015. Intracranial MRA 02/01/2014 cervical spine CT 04/09/2012. FINDINGS: CT HEAD Brain: Abnormal cortical hypodensity in the bilateral superior frontal gyri, and to a lesser extent parietal lobes, corresponding to the abnormal diffusion. No associated hemorrhage or mass effect. Stable underlying cerebral volume. Stable gray-white matter differentiation elsewhere since 07/13/2015. No ventriculomegaly. Calvarium and skull base: Stable, negative. Paranasal sinuses: Stable left maxillary sinus opacification. Orbits: Stable, negative. CTA NECK Skeleton: Osteopenia. Exaggerated cervical lordosis with congenital appearing bilateral C6 lamina defects. Mild C6 compression vertebral compression. The appearance is unchanged from 2014. No acute osseous abnormality identified. Other neck: Intubated and oral enteric tube in place. Endotracheal tube tip terminates above the carina. Enteric tube courses within  the thoracic esophagus. Moderate apical pulmonary scarring. Areas of centrilobular emphysema in the right upper lobe. No superior mediastinal lymphadenopathy. Diminutive or absent thyroid. Negative parapharyngeal and retropharyngeal spaces. Negative sublingual space. Atrophied or surgically absent submandibular glands. There is also degree of bilateral parotid atrophy. No cervical lymphadenopathy. Aortic arch: 3 vessel arch configuration. Moderate soft and calcified arch atherosclerosis. Right carotid system: No brachiocephalic artery stenosis despite soft plaque. No right CCA origin stenosis. Mildly tortuous right CCA. Proximal to the right carotid bifurcation there is medial soft plaque in the right CCA resulting an less than 50 % stenosis with respect to the distal vessel. This appears to be mildly ulcerated (series 501, image 70). At the right carotid bifurcation there is soft and calcified plaque but no proximal right ICA stenosis. Soft and calcified plaque distal to the right ICA bulb without stenosis. The vessel remains patent to the skullbase. Left carotid system: No left CCA origin stenosis. Soft plaque proximal to the left carotid bifurcation without stenosis. Soft and calcified  plaque at the bifurcation without proximal left ICA stenosis. The left ICA is patent to the skullbase. Vertebral arteries: No proximal right subclavian artery stenosis despite soft and calcified plaque. No definite stenosis at the right vertebral artery origin. Patent right vertebral artery to the skullbase without stenosis. Bulky soft plaque in the proximal left subclavian artery resulting an stenosis up to 65-70 % with respect to the distal vessel (series 501, image 27). The left subclavian remains patent. No stenosis at the left vertebral artery origin. Codominant vertebral arteries. No left vertebral artery stenosis to the skullbase. CTA HEAD Posterior circulation: Codominant distal vertebral arteries are patent without  stenosis. Patent vertebrobasilar junction. Normal right PICA origin. Dominant appearing left AICA. Tortuous basilar artery without stenosis. Normal SCA and PCA origins. Normal bilateral PCA branches. Anterior circulation: Both ICA siphons are patent. There is soft plaque suspected in much of the right petrous segment without hemodynamically significant stenosis. Mild calcified plaque. Mild cavernous segment tortuosity. No siphon stenosis. Normal ophthalmic artery origins. Patent carotid termini. Normal MCA origins. Normal ACA origins. The left A1 is dominant. Anterior communicating artery and visualized bilateral ACA branches are within normal limits. Left MCA M1 segment, bifurcation, and left MCA branches are within normal limits. Right MCA M1 segment, bifurcation, and right MCA branches are within normal limits. Venous sinuses: Patent. Anatomic variants: None. Delayed phase: No abnormal enhancement identified. IMPRESSION: 1. Negative for emergent large vessel occlusion. Widespread atherosclerotic plaque, but no hemodynamically significant carotid, vertebral, or intracranial artery stenosis. 2. 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 3. Expected CT appearance of the superior hemispheric diffusion positive lesions seen by MRI yesterday. No associated hemorrhage or mass effect. 4. No acute findings in the neck. Endotracheal tube terminates above the carina, enteric tube courses within the thoracic esophagus. Electronically Signed   By: Genevie Ann M.D.   On: 07/15/2015 11:36   Ct Angio Neck W/cm &/or Wo/cm  07/15/2015  CLINICAL DATA:  75 year old male with widespread superior hemisphere restricted diffusion on brain MRI yesterday possibly related to emboli or severe posterior reversible encephalopathy syndrome. Initial encounter. EXAM: CT ANGIOGRAPHY HEAD AND NECK TECHNIQUE: Multidetector CT imaging of the head and neck was performed using the standard protocol during bolus  administration of intravenous contrast. Multiplanar CT image reconstructions and MIPs were obtained to evaluate the vascular anatomy. Carotid stenosis measurements (when applicable) are obtained utilizing NASCET criteria, using the distal internal carotid diameter as the denominator. CONTRAST:  50 mL Isovue 370 COMPARISON:  Brain MRI 07/14/2015. Head CT 07/13/2015. Intracranial MRA 02/01/2014 cervical spine CT 04/09/2012. FINDINGS: CT HEAD Brain: Abnormal cortical hypodensity in the bilateral superior frontal gyri, and to a lesser extent parietal lobes, corresponding to the abnormal diffusion. No associated hemorrhage or mass effect. Stable underlying cerebral volume. Stable gray-white matter differentiation elsewhere since 07/13/2015. No ventriculomegaly. Calvarium and skull base: Stable, negative. Paranasal sinuses: Stable left maxillary sinus opacification. Orbits: Stable, negative. CTA NECK Skeleton: Osteopenia. Exaggerated cervical lordosis with congenital appearing bilateral C6 lamina defects. Mild C6 compression vertebral compression. The appearance is unchanged from 2014. No acute osseous abnormality identified. Other neck: Intubated and oral enteric tube in place. Endotracheal tube tip terminates above the carina. Enteric tube courses within the thoracic esophagus. Moderate apical pulmonary scarring. Areas of centrilobular emphysema in the right upper lobe. No superior mediastinal lymphadenopathy. Diminutive or absent thyroid. Negative parapharyngeal and retropharyngeal spaces. Negative sublingual space. Atrophied or surgically absent submandibular glands. There is also degree of bilateral parotid atrophy.  No cervical lymphadenopathy. Aortic arch: 3 vessel arch configuration. Moderate soft and calcified arch atherosclerosis. Right carotid system: No brachiocephalic artery stenosis despite soft plaque. No right CCA origin stenosis. Mildly tortuous right CCA. Proximal to the right carotid bifurcation there is  medial soft plaque in the right CCA resulting an less than 50 % stenosis with respect to the distal vessel. This appears to be mildly ulcerated (series 501, image 70). At the right carotid bifurcation there is soft and calcified plaque but no proximal right ICA stenosis. Soft and calcified plaque distal to the right ICA bulb without stenosis. The vessel remains patent to the skullbase. Left carotid system: No left CCA origin stenosis. Soft plaque proximal to the left carotid bifurcation without stenosis. Soft and calcified plaque at the bifurcation without proximal left ICA stenosis. The left ICA is patent to the skullbase. Vertebral arteries: No proximal right subclavian artery stenosis despite soft and calcified plaque. No definite stenosis at the right vertebral artery origin. Patent right vertebral artery to the skullbase without stenosis. Bulky soft plaque in the proximal left subclavian artery resulting an stenosis up to 65-70 % with respect to the distal vessel (series 501, image 27). The left subclavian remains patent. No stenosis at the left vertebral artery origin. Codominant vertebral arteries. No left vertebral artery stenosis to the skullbase. CTA HEAD Posterior circulation: Codominant distal vertebral arteries are patent without stenosis. Patent vertebrobasilar junction. Normal right PICA origin. Dominant appearing left AICA. Tortuous basilar artery without stenosis. Normal SCA and PCA origins. Normal bilateral PCA branches. Anterior circulation: Both ICA siphons are patent. There is soft plaque suspected in much of the right petrous segment without hemodynamically significant stenosis. Mild calcified plaque. Mild cavernous segment tortuosity. No siphon stenosis. Normal ophthalmic artery origins. Patent carotid termini. Normal MCA origins. Normal ACA origins. The left A1 is dominant. Anterior communicating artery and visualized bilateral ACA branches are within normal limits. Left MCA M1 segment,  bifurcation, and left MCA branches are within normal limits. Right MCA M1 segment, bifurcation, and right MCA branches are within normal limits. Venous sinuses: Patent. Anatomic variants: None. Delayed phase: No abnormal enhancement identified. IMPRESSION: 1. Negative for emergent large vessel occlusion. Widespread atherosclerotic plaque, but no hemodynamically significant carotid, vertebral, or intracranial artery stenosis. 2. 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 3. Expected CT appearance of the superior hemispheric diffusion positive lesions seen by MRI yesterday. No associated hemorrhage or mass effect. 4. No acute findings in the neck. Endotracheal tube terminates above the carina, enteric tube courses within the thoracic esophagus. Electronically Signed   By: Genevie Ann M.D.   On: 07/15/2015 11:36   STUDIES:  CT head 04/19 > no acute finding.  Chronic small vessel ischemic changes. CXR 04/20 > focal consolidation LLL and developing consolidation RUL. Brain MRI 04/20 > no acute finding. Advanced atrophy and diffuse white matter disease b/l.  Brain MRI 4/26 > Newly seen extensive areas of restricted diffusion and brain edema in a patchy pattern primarily at the vertices, right more than left. Differential diagnosis is that of an atypical manifestation of posterior reversible encephalopathy/hypertensive encephalopathy versus is embolic infarctions.  CTA Head/Neck 4/27 > Negative for emergent large vessel occlusion. Widespread atherosclerotic plaque, but no hemodynamically significant carotid, vertebral, or intracranial artery stenosis. 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  CULTURES: Blood 04/20 > NG final Sputum 04/20 > Klebsiella pneumoniae, Pseudomonas (sensitivities reported)  ANTIBIOTICS: Zosyn 04/20 >>>4/28 Vanc 4/20>>>4/22, restarted 4/25>>4/27  SIGNIFICANT EVENTS: 04/20 > admitted with hypertensive  urgency, AMS, ? PRES, ? CVA, aspiration PNA, acute hypoxic respiratory failure. 4/25 > AMS, HTN urgency, new left arm weakness  LINES/TUBES: ETT 04/20 >4/21  DISCUSSION: 75 y.o. M with hx uncontrolled HTN and previous hemorrhagic stroke, admitted 04/20 with hypertensive crisis.  While in ED, had worsening mental status along with hypoxic respiratory failure, likely due to aspiration PNA.  He required intubation and PCCM was called to admit to ICU.  Brain MRI pending to rule out CVA and further evaluate for PRES.  ASSESSMENT / PLAN:  NEUROLOGIC A:   Acute encephalopathy - unclear etiology at this point but likely exacerbated by hypertension.  ?CVA Hx TIA, CVA, SAH, hypertensive encephalopathy, headache. Evidently every time the patient's SBP increases above 200 his mental status decompensates and takes days to recover vs acute aspiration (did not do well during swallow evaluation earlier). ?PRES P:   MRI Brain, neuro favoring infarction.  Neurology following, appreciate the assistance. BP control - continue labetalol 50 bid ECHO Dopplers Cartoid dopplers   CARDIOVASCULAR A:  Hypertensive urgency/emergency - resolved after cardene gtt started; cardene now off. Septic shock>resolved , off pressors 4/22  Hx HTN, HLD, MI. 4/22>b/p tr up  P:  Continue outpatient ASA, lovastatin. Continue labetalol 50 bid - BP stable  PULMONARY A: Acute hypoxic respiratory failure - likely due to aspiration.>extubaed 4/21  Questionable aspiration PNA. Hx OSA - on CPAP. P:   Tolerating SBT, mental status improving > extubate today Abx/cultures per ID section.  RENAL A:   Hypokalemia - resolved HypoMg HypoPhos Hyponatremia cvp low P:   KVO IVF. BMP in AM. Replace electrolytes as indicated.  GASTROINTESTINAL A:   GI prophylaxis. Nutrition. Hx dysphagia - has apparently failed swallow evals in the past and refuses to use thickening solution. -4/22 ST eval with D2 diet rec  P:    SUP: Pantoprazole. Hold TF  HEMATOLOGIC A:   VTE Prophylaxis. P:  SCD's / heparin. CBC in AM.  INFECTIOUS A:   Probable aspiration PNA. Septic shock-resolved. P:   Off ABX No new fevers, Leukocytosis resolved PCT <0.1   ENDOCRINE A:   Hypothyroidism. P:   Continue outpatient synthroid   TSH wnl  Family updated: No family present at bedside.  Interdisciplinary Family Meeting v Palliative Care Meeting:  Due by: 07/14/15  Maryellen Pile, MD IMTS PGY-1 [phone]

## 2015-07-16 NOTE — Progress Notes (Signed)
PT Cancellation Note  Patient Details Name: Benjamin Park MRN: [id number] DOB: October 08, 1940   Cancelled Treatment:    Reason Eval/Treat Not Completed: Patient not medically ready.  Spoke with RN and pt is currently weaning.  Will hold PT at this time and f/u as appropriate.     Henslee Lottman, Thornton Papas 07/16/2015, 9:42 AM

## 2015-07-16 NOTE — Progress Notes (Signed)
STROKE TEAM PROGRESS NOTE   SUBJECTIVE (INTERVAL HISTORY) No family is at the bedside.  He still intubated but eyes open, awake alert, following commands. BP in good control.    OBJECTIVE Temp:  [97.6 F (36.4 C)-99.4 F (37.4 C)] 97.6 F (36.4 C) (04/28 1529) Pulse Rate:  [77-102] 94 (04/28 1500) Cardiac Rhythm:  [-] Normal sinus rhythm (04/28 1200) Resp:  [2-21] 21 (04/28 1500) BP: (91-187)/(62-117) 187/117 mmHg (04/28 1500) SpO2:  [91 %-100 %] 99 % (04/28 1500) FiO2 (%):  [40 %] 40 % (04/28 0835) Weight:  [155 lb 3.3 oz (70.4 kg)] 155 lb 3.3 oz (70.4 kg) (04/28 0500)  CBC:   Recent Labs Lab 07/15/15 0758 07/16/15 0254  WBC 8.7 5.6  HGB 13.1 12.4*  HCT 39.6 38.0*  MCV 96.4 98.2  PLT 195 [id number]    Basic Metabolic Panel:  Recent Labs Lab 07/10/15 1341  07/14/15 0243  07/15/15 0758  07/16/15 0002 07/16/15 0254 07/16/15 1236  NA  --   < > 142  --  139  --   --  143  --   K  --   < > 3.8  --  3.4*  --   --  3.4*  --   CL  --   < > 103  --  105  --   --  108  --   CO2  --   < > 28  --  25  --   --  27  --   GLUCOSE  --   < > 128*  --  127*  --   --  107*  --   BUN  --   < > 15  --  17  --   --  19  --   CREATININE  --   < > 1.26*  --  0.94  --   --  0.97  --   CALCIUM  --   < > 8.8*  --  8.4*  --   --  8.6*  --   MG 2.0  --  2.0  --   --   --   --   --   --   PHOS  --   < > 3.8  < >  --   < > 2.7  --  2.3*  < > = values in this interval not displayed.  Lipid Panel:     Component Value Date/Time   CHOL 119 07/16/2015 0254   TRIG 121 07/16/2015 0254   HDL 26* 07/16/2015 0254   CHOLHDL 4.6 07/16/2015 0254   VLDL 24 07/16/2015 0254   LDLCALC 69 07/16/2015 0254   HgbA1c:  Lab Results  Component Value Date   HGBA1C 5.5 03/15/2015   Urine Drug Screen:     Component Value Date/Time   LABOPIA NONE DETECTED 07/08/2015 0728   LABOPIA NEGATIVE 02/24/2011 1007   COCAINSCRNUR NONE DETECTED 07/08/2015 0728   COCAINSCRNUR NEGATIVE 02/24/2011 1007   LABBENZ  POSITIVE* 07/08/2015 0728   LABBENZ NEGATIVE 02/24/2011 1007   AMPHETMU NONE DETECTED 07/08/2015 0728   AMPHETMU NEGATIVE 02/24/2011 1007   THCU NONE DETECTED 07/08/2015 0728   LABBARB NONE DETECTED 07/08/2015 0728      IMAGING I have personally reviewed the radiological images below and agree with the radiology interpretations. Blue text is my interpretation.  Ct Head Wo Contrast  07/13/2015  IMPRESSION: Chronic atrophic and ischemic changes are noted. No acute abnormality seen.   Mr Brain Wo Contrast  07/14/2015  IMPRESSION: Newly seen extensive areas of restricted diffusion and brain edema in a patchy pattern primarily at the vertices, right more than left. Differential diagnosis is that of an atypical manifestation of posterior reversible encephalopathy/hypertensive encephalopathy versus is embolic infarctions. Due to the history of multiple presentations in the past during episodes of severe hypertension, posterior reversible encephalopathy is favored. However, significant DWI changes with less pronounced T2 changes as well as acute neuro change more supportive of infarction.   Dg Chest Port 1 View  07/15/2015  IMPRESSION: Overall improving aeration of the lungs with residual probable left lower lobe atelectasis.   07/14/2015  IMPRESSION: Stable appearance when compared with the prior exam.   07/13/2015 IMPRESSION: Endotracheal tube and nasogastric catheter as described. Patchy infiltrative changes left greater than right.   EEG 07/14/15 This EEG is consistent with a moderate generalized non-specific cerebral dysfunction(encephalopathy). Though non-specific, triphasic waves are often assocaited with toxic/metabolic encephalopathies. There was no seizure or seizure predisposition recorded on this study. Please note that a normal EEG does not preclude the possibility of epilepsy.   EEG 07/09/15 This sedated EEG is abnormal due to the presence of: 1. Moderate diffuse slowing of the  background 2. Additional occasional focal slowing over the right parietal region Clinical Correlation of the above findings indicates diffuse cerebral dysfunction that is non-specific in etiology and can be seen with hypoxic/ischemic injury, toxic/metabolic encephalopathies, or medication effect. Additional focal slowing over the right parietal region indicates focal cerebral dysfunction in this region suggestive of underlying structural or physiologic abnormality. The absence of epileptiform discharges does not rule out a clinical diagnosis of epilepsy. Clinical correlation is advised.  LE venous doppler - No evidence of DVT, superficial thrombosis, or Baker's Cyst  Ct Angio Head and neck W/cm &/or Wo Cm 07/15/2015  IMPRESSION: 1. Negative for emergent large vessel occlusion. Widespread atherosclerotic plaque, but no hemodynamically significant carotid, vertebral, or intracranial artery stenosis. 2. 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 3. Expected CT appearance of the superior hemispheric diffusion positive lesions seen by MRI yesterday. No associated hemorrhage or mass effect. 4. No acute findings in the neck. Endotracheal tube terminates above the carina, enteric tube courses within the thoracic esophagus.   TTE pending   PHYSICAL EXAM  Temp:  [97.6 F (36.4 C)-99.4 F (37.4 C)] 97.6 F (36.4 C) (04/28 1529) Pulse Rate:  [77-102] 94 (04/28 1500) Resp:  [2-21] 21 (04/28 1500) BP: (91-187)/(62-117) 187/117 mmHg (04/28 1500) SpO2:  [91 %-100 %] 99 % (04/28 1500) FiO2 (%):  [40 %] 40 % (04/28 0835) Weight:  [155 lb 3.3 oz (70.4 kg)] 155 lb 3.3 oz (70.4 kg) (04/28 0500)  General - Well nourished, well developed, intubated but awake alert.  Ophthalmologic - Fundi not visualized due to noncooperation (keep gaging and cough with ET).  Cardiovascular - Regular rate and rhythm.  Neuro- awake alert, intubated on ventilation, follows simple central  and peripheral commands. PERRL, EOMI, blinking to visual threat bilaterally. Facial difficulty to evaluate due to ET tube. RUE 5/5, LUE 0/5, RLE 3+/5 on stimulation and LLE 2/5 on pain stimulation. DTR 1+ and no babinski. Sensation and coordination or gait not tested.   ASSESSMENT/PLAN Benjamin Park is a 75 y.o. male with history of baseline Parkinson's disease, dementia, multiple ER visits for altered mental status in the setting of severe hypertension admitted for worsening hypertension, confusion and altered mental status. Initiated cardene and then BP down to 90s. ABG showed  hypoxia and he was intubated. MRI 07/08/15 showed no acute infarct. EEG diffuse slow on 07/09/15. Extubated same day and on zoysn for PNA and was ready for discharge on 07/14/15 when he again had hypoxia, lethargy and left sided weakness. He was again intubated for airway protection. EEG showed triphasic waves and MRI showed b/l MCA and ACA and MCA/ACA infarcts, R>L. He did not receive IV t-PA due to hx of SDH in 2011.   Stroke:  bilateral MCA, ACA, MCA/ACA infarct, embolic pattern secondary to unclear source  Resultant  Left hemiparesis  MRI  Bilateral high convexity frontal MCA, ACA, MCA/ACA infarcts  CTA head and neck - unremarkable  EEG x 2 no seizure but triphasic wave  LE venous doppler - no DVT  2D Echo  pending  Recommend 30 day cardiac event monitoring as outpt to rule out afib.   LDL 69  HgbA1c pending  Heparin subq for VTE prophylaxis  aspirin 325 mg daily prior to admission, now on aspirin 325 mg daily. Continue ASA 325 on discharge.  Ongoing aggressive stroke risk factor management  Therapy recommendations:  pending  Disposition:  Pending  Hypertensive encephalopathy  Stable now Frequent admission for hypertensive encephalopathy EEG showed triphasic waves Management as per primary team  Aspiration pneumonia  CXR improved  On zosyn  Treatment as per primary  team  Hyperlipidemia  No statin at home  LDL 69, goal < 70  Diabetes  HgbA1c pending, goal < 7.0  CBG in control  On SSI  Other Stroke Risk Factors  Advanced age  Hx stroke/TIA s/p PFO closure  Obstructive sleep apnea, on CPAP at home  Hx of CAD/MI  Other Active Problems  SDH in 2011  PD  Dementia   S/p PFO closure  Hospital day # 9  This patient is critically ill due to acute ischemic stroke, hypertensive emergency and encephalopathy and at significant risk of neurological worsening, death form hemorrhagic transformation, recurrent stroke, stroke extension, heart failure. This patient's care requires constant monitoring of vital signs, hemodynamics, respiratory and cardiac monitoring, review of multiple databases, neurological assessment, discussion with family, other specialists and medical decision making of high complexity. I spent 35 minutes of neurocritical care time in the care of this patient.  Neurology will sign off. Please call with questions. Pt will follow up with Dr. Erlinda Hong at [facility] in about 2 months. Thanks for the consult.   Rosalin Hawking, MD PhD Stroke Neurology 07/16/2015 4:06 PM   To contact Stroke Continuity provider, please refer to [email or web address] After hours, contact General Neurology

## 2015-07-17 ENCOUNTER — Inpatient Hospital Stay (HOSPITAL_COMMUNITY): Payer: Medicare HMO

## 2015-07-17 LAB — GLUCOSE, CAPILLARY
GLUCOSE-CAPILLARY: 104 mg/dL — AB (ref 65–99)
GLUCOSE-CAPILLARY: 121 mg/dL — AB (ref 65–99)
GLUCOSE-CAPILLARY: 93 mg/dL (ref 65–99)
Glucose-Capillary: 84 mg/dL (ref 65–99)
Glucose-Capillary: 93 mg/dL (ref 65–99)
Glucose-Capillary: 94 mg/dL (ref 65–99)

## 2015-07-17 LAB — HEMOGLOBIN A1C
Hgb A1c MFr Bld: 5.6 % (ref 4.8–5.6)
MEAN PLASMA GLUCOSE: 114 mg/dL

## 2015-07-17 LAB — BASIC METABOLIC PANEL
ANION GAP: 11 (ref 5–15)
BUN: 18 mg/dL (ref 6–20)
CHLORIDE: 108 mmol/L (ref 101–111)
CO2: 26 mmol/L (ref 22–32)
Calcium: 8.8 mg/dL — ABNORMAL LOW (ref 8.9–10.3)
Creatinine, Ser: 0.84 mg/dL (ref 0.61–1.24)
GFR calc Af Amer: >60 mL/min (ref >60)
GLUCOSE: 91 mg/dL (ref 65–99)
POTASSIUM: 3.5 mmol/L (ref 3.5–5.1)
Sodium: 145 mmol/L (ref 135–145)

## 2015-07-17 LAB — PHOSPHORUS: Phosphorus: 2.9 mg/dL (ref 2.5–4.6)

## 2015-07-17 NOTE — Progress Notes (Signed)
PULMONARY / CRITICAL CARE MEDICINE   Name: Benjamin Park MRN: [id number] DOB: 27-Apr-1940    ADMISSION DATE:  07/17/2015 CONSULTATION DATE:  07/08/15  REFERRING MD:  EDP  CHIEF COMPLAINT:  AMS  HISTORY OF PRESENT ILLNESS:  Pt is encephelopathic; therefore, this HPI is obtained from chart review. Benjamin Park is a 75 y.o. male with PMH as outlined below including uncontrolled hypertension for which he has had multiple admissions.  He was brought to [facility] ED 04/19 evening by his wife who informed staff that he was having a stroke due to the fact that he was unable to follow commands and had gargled speech.  He has hx of hemorrhagic stroke and apparently presented very similarly at the time.  Wife stated that he never had any focal deficits, only gargled speech and inability follow commands.   In ED, he had elevated BP's into the low 200's.  He was started on nicardipine and after only 3 - 5 minutes, BP dropped into low 100's per ED RN.  He was evaluated by neurology and initial head CT was negative for acute infarct or hemorrhage. While in ED, he had sudden change in mental status to the point where he became minimally responsive. CXR showed new LLL infiltrate, likely due to aspiration PNA.  He was intubated and admitted to the ICU. BP and mental status improved. He was transferred to the floor on 4/21. He has been doing well on the floor until 4/25 afternoon when preparing for discharge. He had desaturations on his O2 stats and concerns for aspiration. Mental status began to decline and he became lethargic and BP climbed to the [id number] systolic. He required intubation for airway protection and transferred to the ICU.   SUBJECTIVE: Extubated yesterday. Doing well.   VITAL SIGNS: BP 160/109 mmHg  Pulse 99  Temp(Src) 98.6 F (37 C) (Oral)  Resp 19  Ht 5\' 7"  (1.702 m)  Wt 147 lb 4.3 oz (66.8 kg)  BMI 23.06 kg/m2  SpO2 92%  HEMODYNAMICS:    VENTILATOR SETTINGS: Vent Mode:  [-] PSV FiO2  (%):  [40 %] 40 % PEEP:  [5 cmH20] 5 cmH20 Pressure Support:  [5 cmH20] 5 cmH20  INTAKE / OUTPUT: I/O last 3 completed shifts: In: 1581.7 [I.V.:870; NG/GT:711.7] Out: 3035 [Urine:3035]  PHYSICAL EXAMINATION: General: Elderly male, chronically ill appearing, in NAD. Sitting up in chair. Neuro: Alert, following commands. Left hemiparesis.  HEENT: PERRL, moist mucus membranes Cardiovascular: RRR, no MRG.  Lungs: Clear, No wheeze or crackles Abdomen: BS x 4, soft, NT, ND Musculoskeletal: No gross deformities, no edema.  Skin: Intact, warm, no rashes.  LABS:  BMET  Recent Labs Lab 07/15/15 0758 07/16/15 0254 07/17/15 0104  NA 139 143 145  K 3.4* 3.4* 3.5  CL 105 108 108  CO2 25 27 26   BUN 17 19 18   CREATININE 0.94 0.97 0.84  GLUCOSE 127* 107* 91   Electrolytes  Recent Labs Lab 07/10/15 1341  07/14/15 0243  07/15/15 0758  07/16/15 0002 07/16/15 0254 07/16/15 1236 07/17/15 0104  CALCIUM  --   < > 8.8*  --  8.4*  --   --  8.6*  --  8.8*  MG 2.0  --  2.0  --   --   --   --   --   --   --   PHOS  --   < > 3.8  < >  --   < > 2.7  --  2.3* 2.9  < > =  values in this interval not displayed.  CBC  Recent Labs Lab 07/14/15 0243 07/15/15 0758 07/16/15 0254  WBC 14.0* 8.7 5.6  HGB 13.8 13.1 12.4*  HCT 42.2 39.6 38.0*  PLT 200 195 223   Coag's No results for input(s): APTT, INR in the last 168 hours. Sepsis Markers  Recent Labs Lab 07/14/15 1205 07/15/15 0138  PROCALCITON 0.32 0.27   ABG  Recent Labs Lab 07/13/15 1655 07/13/15 1830 07/14/15 0358  PHART 7.496* 7.458* 7.462*  PCO2ART 33.1* 37.0 34.6*  PO2ART 54.0* 287* 186*   Liver Enzymes  Recent Labs Lab 07/14/15 0243  AST 21  ALT 27  ALKPHOS 51  BILITOT 0.6  ALBUMIN 2.7*   Cardiac Enzymes  Recent Labs Lab 07/15/15 0138 07/15/15 0758 07/15/15 1128  TROPONINI 0.03 0.03 <0.03   Glucose  Recent Labs Lab 07/16/15 0844 07/16/15 1203 07/16/15 1511 07/16/15 1949 07/16/15 2340  07/17/15 0335  GLUCAP 97 99 96 99 94 84   Imaging No results found. STUDIES:  CT head 04/19 > no acute finding.  Chronic small vessel ischemic changes. CXR 04/20 > focal consolidation LLL and developing consolidation RUL. Brain MRI 04/20 > no acute finding. Advanced atrophy and diffuse white matter disease b/l.  Brain MRI 4/26 > Newly seen extensive areas of restricted diffusion and brain edema in a patchy pattern primarily at the vertices, right more than left. Differential diagnosis is that of an atypical manifestation of posterior reversible encephalopathy/hypertensive encephalopathy versus is embolic infarctions.  CTA Head/Neck 4/27 > Negative for emergent large vessel occlusion. Widespread atherosclerotic plaque, but no hemodynamically significant carotid, vertebral, or intracranial artery stenosis. 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  CULTURES: Blood 04/20 > NG final Sputum 04/20 > Klebsiella pneumoniae, Pseudomonas (sensitivities reported)  ANTIBIOTICS: Zosyn 04/20 >>>4/28 Vanc 4/20>>>4/22, restarted 4/25>>4/27  SIGNIFICANT EVENTS: 04/20 > admitted with hypertensive urgency, AMS, ? PRES, ? CVA, aspiration PNA, acute hypoxic respiratory failure. 4/25 > AMS, HTN urgency, new left arm weakness  LINES/TUBES: ETT 04/20 >4/21  DISCUSSION: 75 y.o. M with hx uncontrolled HTN and previous hemorrhagic stroke, admitted 04/20 with hypertensive crisis.  While in ED, had worsening mental status along with hypoxic respiratory failure, likely due to aspiration PNA.  He required intubation and PCCM was called to admit to ICU.  Brain MRI pending to rule out CVA and further evaluate for PRES.  ASSESSMENT / PLAN:  NEUROLOGIC A:   Acute encephalopathy - unclear etiology at this point but likely exacerbated by hypertension.  ?CVA Hx TIA, CVA, SAH, hypertensive encephalopathy, headache. Evidently every time the patient's SBP increases above 200 his  mental status decompensates and takes days to recover vs acute aspiration (did not do well during swallow evaluation earlier). ?PRES P:   MRI Brain pending, neuro thinks this may be stroke rather than PRES Neurology following, appreciate the assistance. BP control - continue labetalol 50 bid> Unable to take PO until swallow eval.  Hydralazine PRN.  Cartoid dopplers pending  CARDIOVASCULAR A:  Hypertensive urgency/emergency - resolved after cardene gtt started; cardene now off. Septic shock>resolved , off pressors 4/22  Hx HTN, HLD, MI. 4/22>b/p tr up  P:  Continue outpatient ASA, lovastatin. BP stable  PULMONARY A: Acute hypoxic respiratory failure - likely due to aspiration.>extubaed 4/21  Questionable aspiration PNA. Hx OSA - on CPAP. P:   Wean down O2 as tolerated Abx/cultures per ID section.  RENAL A:   Hypokalemia - resolved HypoMg HypoPhos Hyponatremia cvp low P:  KVO IVF. BMP in AM. Replace electrolytes as indicated.  GASTROINTESTINAL A:   GI prophylaxis. Nutrition. Hx dysphagia - has apparently failed swallow evals in the past and refuses to use thickening solution. -4/22 ST eval with D2 diet rec  P:   Swallow eval Keep NPO  HEMATOLOGIC A:   VTE Prophylaxis. P:  SCD's / heparin. CBC in AM.  INFECTIOUS A:   Probable aspiration PNA. Septic shock-resolved. P:   Off ABX No new fevers, Leukocytosis resolved PCT <0.1   ENDOCRINE A:   Hypothyroidism. P:   Continue outpatient synthroid   TSH wnl  Family updated: No family present at bedside. Interdisciplinary Family Meeting v Palliative Care Meeting:  Due by: 07/14/15  Marshell Garfinkel MD [facility] Pager [phone] If no answer or after 3pm call: [phone] 07/17/2015, 7:45 AM

## 2015-07-17 NOTE — Evaluation (Signed)
Clinical/Bedside Swallow Evaluation Patient Details  Name: Benjamin Park MRN: [id number] Date of Birth: 15-May-1940  Today's Date: 07/17/2015 Time: SLP Start Time (ACUTE ONLY): 0914 SLP Stop Time (ACUTE ONLY): 0942 SLP Time Calculation (min) (ACUTE ONLY): 28 min  Past Medical History:  Past Medical History  Diagnosis Date  . Hx of transient ischemic attack (TIA)   . Hyperlipidemia   . ED (erectile dysfunction)   . Personal history of kidney stones   . Subarachnoid hemorrhage ([place])     '11- hospitalized -3 to 4 days(passed out-awoke in hospital)- no residual affects.  . Myocardial infarction ([place])     12/1991  . Dysphagia   . Hypokalemia   . Hypothyroidism   . Sleep apnea     on CPAP-settings 6  . History of kidney stones 02-27-13    x5- none recent  . Throat cancer [facility])     '00-radiation therapy  . Stroke ([place])   . Unspecified cerebral artery occlusion with cerebral infarction 11/08/2012  . Episodic confusion 02-27-13    hx. of, not currently(multiple episodes- ? TIA's)-always saw MD to be evaluated.  Marland Kitchen AKI (acute kidney injury) ([place]) 02/23/2011    pt. denies any problems 02-27-13  . Hypertension     hx. labile - with spikes every 3-6 mos.-last 8'14 ED visit.  Marland Kitchen Hypertensive encephalopathy   . Headache(784.0)     episodic with periods of confusion -multiple  . Neuromuscular disorder ([place])     "parkinson's" -no tremor-sees neurology MD at [facility]  . Coronary artery disease     Dr. [place]   Past Surgical History:  Past Surgical History  Procedure Laterality Date  . Angioplasty  1993    LAD  . Cardiac catheterization    . Coronary angioplasty    . Basal cell cancer      head  . Hernia repair    . Appendectomy    . Patent foramen ovale closure  2008  . Eye surgery    . Colonoscopy N/A 06/18/2012    Procedure: COLONOSCOPY;  Surgeon: Jeryl Columbia, MD;  Location: [facility] ENDOSCOPY;  Service: Endoscopy;  Laterality: N/A;  h&p in file-[place]   . Hot hemostasis N/A 06/18/2012     Procedure: HOT HEMOSTASIS (ARGON PLASMA COAGULATION/BICAP);  Surgeon: Jeryl Columbia, MD;  Location: [facility] ENDOSCOPY;  Service: Endoscopy;  Laterality: N/A;  . Colonoscopy with propofol N/A 03/07/2013    Procedure: COLONOSCOPY WITH PROPOFOL;  Surgeon: Jeryl Columbia, MD;  Location: [facility] ENDOSCOPY;  Service: Endoscopy;  Laterality: N/A;   HPI:  75 y.o. male with PMH as outlined below including uncontrolled hypertension, Parkinsons' disease.  He was brought to [facility] ED 04/19 evening by his wife who informed staff that he was having a stroke due to the fact that he was unable to follow commands and had gargled speech. He has hx of hemorrhagic stroke and apparently presented very similarly at the time. He was last seen normal around 1pm earlier that afternoon. Wife stated that he never had any focal deficits, only gargled speech and inability follow commands. Pt has hx of throat cancer with radiation from 2000; he has long hx of dysphagia with repeated MBS studies and frequent aspiration.   Last study completed showed mild oral and severe pharyngeal phase dysphagia.  Diet recommended was dys2/pudding.  Pt required intubation before discharge and was extubated 11/28.  Swallow evaluation reordered.  CXR showed small bilateral pleural effusions - likely ATX 4/29.     Assessment / Plan /  Recommendation Clinical Impression  Pt currently grossly weak, does not follow directions and is aphonic.  Oral cavity with dry viscous white-tinged secretions adhered to palate - pt allowed SLP to provide oral care reluctantly.    Limited palatal elevation noted with suctioning with Yankeur's.  SLP provided pt with 3 very small single ice chip boluses = pt did not seal labial tissue despite max verbal/visual/tactile cues.  He did not orally transit and elicit swallow with first two boluses.  Delayed swallow noted with 3rd small ice bolus= swallow appeared excessively weak.      Due to pt's gross weakness, possible CN dysfunction, gross  dysphagia currently and baseline severe dysphagia, recommend NPO.  Educated pt to recommendations and goals to be improved secretion management, ability to elicit swallow and strenghening of cough/voice prior to po possibility.  Uncertain if he understands reasoning and recommendations and pt was becoming sleepy during session - closing his eyes frequently.    Concern for swallow prognosis present given h/o severe deficits likely due to Parkinson's and pharyngeal cancer requiring XRT prior to this event.     Aspiration Risk  Severe aspiration risk;Risk for inadequate nutrition/hydration    Diet Recommendation NPO;Alternative means - temporary   Medication Administration: Via alternative means    Other  Recommendations Oral Care Recommendations: Oral care QID   Follow up Recommendations    tbd   Frequency and Duration min 2x/week  2 weeks       Prognosis Prognosis for Safe Diet Advancement: Guarded Barriers to Reach Goals: Time post onset;Severity of deficits      Swallow Study   General Date of Onset: 07/17/15 HPI: 75 y.o. male with PMH as outlined below including uncontrolled hypertension, Parkinsons' disease.  He was brought to [facility] ED 04/19 evening by his wife who informed staff that he was having a stroke due to the fact that he was unable to follow commands and had gargled speech. He has hx of hemorrhagic stroke and apparently presented very similarly at the time. He was last seen normal around 1pm earlier that afternoon. Wife stated that he never had any focal deficits, only gargled speech and inability follow commands. Pt has hx of throat cancer with radiation from 2000; he has long hx of dysphagia with repeated MBS studies and frequent aspiration.   Last study completed showed mild oral and severe pharyngeal phase dysphagia.  Diet recommended was dys2/pudding.  Pt required intubation before discharge and was extubated 11/28.  Swallow evaluation reordered.  CXR showed small  bilateral pleural effusions - likely ATX 4/29.   Type of Study: Bedside Swallow Evaluation Diet Prior to this Study: NPO Temperature Spikes Noted: Yes (low grade) Respiratory Status: Nasal cannula (4) Length of Intubations (days): 4 days Date extubated: 07/16/15 Behavior/Cognition: Alert;Doesn't follow directions;Lethargic/Drowsy Oral Cavity Assessment: Dry;Dried secretions;Excessive secretions (dried viscous white secretions ) Oral Care Completed by SLP: Yes Oral Cavity - Dentition: Dentures, not available Self-Feeding Abilities: Total assist Patient Positioning: Upright in bed Baseline Vocal Quality: Suspected CN X (Vagus) involvement;Not observed Volitional Cough: Weak Volitional Swallow: Unable to elicit    Oral/Motor/Sensory Function Overall Oral Motor/Sensory Function: Generalized oral weakness (gross weakness, pt did not seal lips on spoon despite total cues with tactile assist)   Ice Chips Ice chips: Impaired Presentation: Spoon Oral Phase Impairments: Reduced lingual movement/coordination;Poor awareness of bolus;Reduced labial seal Oral Phase Functional Implications: Oral holding Pharyngeal Phase Impairments: Suspected delayed Swallow Other Comments: excessively weak swallow    Thin Liquid Thin Liquid: Not  tested    Nectar Thick Nectar Thick Liquid: Not tested   Honey Thick Honey Thick Liquid: Not tested   Puree Puree: Not tested   Solid   GO   Solid: Not tested       Luanna Salk, [place] [facility] SLP [phone]

## 2015-07-18 ENCOUNTER — Inpatient Hospital Stay (HOSPITAL_COMMUNITY): Payer: Medicare HMO

## 2015-07-18 LAB — BASIC METABOLIC PANEL
ANION GAP: 9 (ref 5–15)
BUN: 32 mg/dL — AB (ref 6–20)
CALCIUM: 8.9 mg/dL (ref 8.9–10.3)
CO2: 26 mmol/L (ref 22–32)
Chloride: 109 mmol/L (ref 101–111)
Creatinine, Ser: 1.07 mg/dL (ref 0.61–1.24)
GFR calc Af Amer: >60 mL/min (ref >60)
Glucose, Bld: 102 mg/dL — ABNORMAL HIGH (ref 65–99)
POTASSIUM: 3.4 mmol/L — AB (ref 3.5–5.1)
SODIUM: 144 mmol/L (ref 135–145)

## 2015-07-18 LAB — CBC
HCT: 43.7 % (ref 39.0–52.0)
Hemoglobin: 14.1 g/dL (ref 13.0–17.0)
MCH: 32.1 pg (ref 26.0–34.0)
MCHC: 32.3 g/dL (ref 30.0–36.0)
MCV: 99.5 fL (ref 78.0–100.0)
PLATELETS: 247 10*3/uL (ref 150–400)
RBC: 4.39 MIL/uL (ref 4.22–5.81)
RDW: 13.3 % (ref 11.5–15.5)
WBC: 4.5 10*3/uL (ref 4.0–10.5)

## 2015-07-18 LAB — GLUCOSE, CAPILLARY
GLUCOSE-CAPILLARY: 100 mg/dL — AB (ref 65–99)
GLUCOSE-CAPILLARY: 105 mg/dL — AB (ref 65–99)
Glucose-Capillary: 99 mg/dL (ref 65–99)
Glucose-Capillary: 115 mg/dL — ABNORMAL HIGH (ref 65–99)
Glucose-Capillary: 100 mg/dL — ABNORMAL HIGH (ref 65–99)

## 2015-07-18 MED ORDER — LABETALOL HCL 100 MG PO TABS
100.0000 mg | ORAL_TABLET | Freq: Two times a day (BID) | ORAL | Status: DC
  Administered 2015-07-19 – 2015-07-21 (×5): 100 mg
  Filled 2015-07-18 (×5): qty 1

## 2015-07-18 MED ORDER — HYDRALAZINE HCL 20 MG/ML IJ SOLN
20.0000 mg | Freq: Four times a day (QID) | INTRAMUSCULAR | Status: DC | PRN
  Administered 2015-07-18 – 2015-07-21 (×2): 20 mg via INTRAVENOUS
  Filled 2015-07-18 (×3): qty 1

## 2015-07-18 NOTE — Progress Notes (Signed)
[facility] Progress Note Patient Name: ARDEL SLOAN DOB: 12/06/1940 MRN: [id number]   Date of Service  07/18/2015  HPI/Events of Note  BP up  eICU Interventions  Increase labetalol Increase hydralazine     Intervention Category Major Interventions: Hypertension - evaluation and management  Simonne Maffucci 07/18/2015, 10:57 PM

## 2015-07-18 NOTE — Progress Notes (Signed)
PULMONARY / CRITICAL CARE MEDICINE   Name: Benjamin Park MRN: [id number] DOB: 08-24-1940    ADMISSION DATE:  06/24/2015 CONSULTATION DATE:  07/08/15  REFERRING MD:  EDP  CHIEF COMPLAINT:  AMS  HISTORY OF PRESENT ILLNESS:  Pt is encephelopathic; therefore, this HPI is obtained from chart review. Benjamin Park is a 75 y.o. male with PMH as outlined below including uncontrolled hypertension for which he has had multiple admissions.  He was brought to [facility] ED 04/19 evening by his wife who informed staff that he was having a stroke due to the fact that he was unable to follow commands and had gargled speech.  He has hx of hemorrhagic stroke and apparently presented very similarly at the time.  Wife stated that he never had any focal deficits, only gargled speech and inability follow commands.   In ED, he had elevated BP's into the low 200's.  He was started on nicardipine and after only 3 - 5 minutes, BP dropped into low 100's per ED RN.  He was evaluated by neurology and initial head CT was negative for acute infarct or hemorrhage. While in ED, he had sudden change in mental status to the point where he became minimally responsive. CXR showed new LLL infiltrate, likely due to aspiration PNA.  He was intubated and admitted to the ICU. BP and mental status improved. He was transferred to the floor on 4/21. He has been doing well on the floor until 4/25 afternoon when preparing for discharge. He had desaturations on his O2 stats and concerns for aspiration. Mental status began to decline and he became lethargic and BP climbed to the 123456 systolic. He required intubation for airway protection and transferred to the ICU.   SUBJECTIVE: Extubated yesterday. Did not pass swallow eval.  VITAL SIGNS: BP 149/99 mmHg  Pulse 93  Temp(Src) 97.9 F (36.6 C) (Oral)  Resp 17  Ht 5\' 7"  (1.702 m)  Wt 147 lb 4.3 oz (66.8 kg)  BMI 23.06 kg/m2  SpO2 95%  HEMODYNAMICS:    VENTILATOR SETTINGS:    INTAKE /  OUTPUT: I/O last 3 completed shifts: In: 300 [I.V.:240; NG/GT:60] Out: 2995 [Urine:2995]  PHYSICAL EXAMINATION: General: Elderly male, chronically ill appearing, in NAD. Sitting up in chair. Neuro: Alert, following commands. Left hemiparesis.  HEENT: PERRL, moist mucus membranes Cardiovascular: RRR, no MRG.  Lungs: Clear, No wheeze or crackles Abdomen: BS x 4, soft, NT, ND Musculoskeletal: No gross deformities, no edema.  Skin: Intact, warm, no rashes.  LABS:  BMET  Recent Labs Lab 07/16/15 0254 07/17/15 0104 07/18/15 0333  NA 143 145 144  K 3.4* 3.5 3.4*  CL 108 108 109  CO2 27 26 26   BUN 19 18 32*  CREATININE 0.97 0.84 1.07  GLUCOSE 107* 91 102*   Electrolytes  Recent Labs Lab 07/14/15 0243  07/16/15 0002 07/16/15 0254 07/16/15 1236 07/17/15 0104 07/18/15 0333  CALCIUM 8.8*  < >  --  8.6*  --  8.8* 8.9  MG 2.0  --   --   --   --   --   --   PHOS 3.8  < > 2.7  --  2.3* 2.9  --   < > = values in this interval not displayed.  CBC  Recent Labs Lab 07/15/15 0758 07/16/15 0254 07/18/15 0333  WBC 8.7 5.6 4.5  HGB 13.1 12.4* 14.1  HCT 39.6 38.0* 43.7  PLT 195 223 247   Coag's No results for input(s): APTT,  INR in the last 168 hours. Sepsis Markers  Recent Labs Lab 07/14/15 1205 07/15/15 0138  PROCALCITON 0.32 0.27   ABG  Recent Labs Lab 07/13/15 1655 07/13/15 1830 07/14/15 0358  PHART 7.496* 7.458* 7.462*  PCO2ART 33.1* 37.0 34.6*  PO2ART 54.0* 287* 186*   Liver Enzymes  Recent Labs Lab 07/14/15 0243  AST 21  ALT 27  ALKPHOS 51  BILITOT 0.6  ALBUMIN 2.7*   Cardiac Enzymes  Recent Labs Lab 07/15/15 0138 07/15/15 0758 07/15/15 1128  TROPONINI 0.03 0.03 <0.03   Glucose  Recent Labs Lab 07/17/15 1203 07/17/15 1634 07/17/15 2014 07/17/15 2353 07/18/15 0916 07/18/15 1240  GLUCAP 104* 121* 93 105* 99 115*   Imaging Dg Abd 1 View  07/17/2015  CLINICAL DATA:  75 year old male status post NG tube placement. EXAM:  ABDOMEN - 1 VIEW COMPARISON:  03/12/2015. FINDINGS: Nasogastric tube coiled in the proximal stomach. Gas is noted throughout the colon. Several nondilated gas-filled loops of small bowel are also noted. No gross evidence of pneumoperitoneum identified on this single supine view. IMPRESSION: 1. Tip of nasogastric tube is in the proximal stomach. Electronically Signed   By: Vinnie Langton M.D.   On: 07/17/2015 14:49   STUDIES:  CT head 04/19 > no acute finding.  Chronic small vessel ischemic changes. CXR 04/20 > focal consolidation LLL and developing consolidation RUL. Brain MRI 04/20 > no acute finding. Advanced atrophy and diffuse white matter disease b/l.  Brain MRI 4/26 > Newly seen extensive areas of restricted diffusion and brain edema in a patchy pattern primarily at the vertices, right more than left. Differential diagnosis is that of an atypical manifestation of posterior reversible encephalopathy/hypertensive encephalopathy versus is embolic infarctions.  CTA Head/Neck 4/27 > Negative for emergent large vessel occlusion. Widespread atherosclerotic plaque, but no hemodynamically significant carotid, vertebral, or intracranial artery stenosis. 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  CULTURES: Blood 04/20 > NG final Sputum 04/20 > Klebsiella pneumoniae, Pseudomonas (sensitivities reported)  ANTIBIOTICS: Zosyn 04/20 >>>4/28 Vanc 4/20>>>4/22, restarted 4/25>>4/27  SIGNIFICANT EVENTS: 04/20 > admitted with hypertensive urgency, AMS, ? PRES, ? CVA, aspiration PNA, acute hypoxic respiratory failure. 4/25 > AMS, HTN urgency, new left arm weakness  LINES/TUBES: ETT 04/20 >4/21  DISCUSSION: 75 y.o. M with hx uncontrolled HTN and previous hemorrhagic stroke, admitted 04/20 with hypertensive crisis.  While in ED, had worsening mental status along with hypoxic respiratory failure, likely due to aspiration PNA.  He required intubation and PCCM was  called to admit to ICU.  Brain MRI pending to rule out CVA and further evaluate for PRES.  ASSESSMENT / PLAN:  NEUROLOGIC A:   Acute encephalopathy - unclear etiology at this point but likely exacerbated by hypertension.  ?CVA Hx TIA, CVA, SAH, hypertensive encephalopathy, headache. Evidently every time the patient's SBP increases above 200 his mental status decompensates and takes days to recover vs acute aspiration (did not do well during swallow evaluation earlier). ?PRES P:   MRI Brain reviewed, neuro thinks this may be stroke rather than PRES Neurology following, appreciate the assistance. BP control - continue labetalol 50 bid via tube Hydralazine PRN.  Cartoid dopplers pending  CARDIOVASCULAR A:  Hypertensive urgency/emergency - resolved after cardene gtt started; cardene now off. Septic shock>resolved , off pressors 4/22  Hx HTN, HLD, MI. 4/22>b/p tr up  P:  Continue outpatient ASA, lovastatin. BP stable  PULMONARY A: Acute hypoxic respiratory failure - likely due to aspiration.>extubaed 4/21  Questionable aspiration PNA.  Hx OSA - on CPAP. P:   Wean down O2 as tolerated Abx/cultures per ID section.  RENAL A:   Hypokalemia - resolved HypoMg HypoPhos Hyponatremia cvp low P:   KVO IVF. BMP in AM. Replace electrolytes as indicated.  GASTROINTESTINAL A:   GI prophylaxis. Nutrition. Hx dysphagia - has apparently failed swallow evals in the past and refuses to use thickening solution. -4/22 ST eval with D2 diet rec  P:   Keep NPO. Swallow service to follow.  HEMATOLOGIC A:   VTE Prophylaxis. P:  SCD's / heparin. CBC in AM.  INFECTIOUS A:   Probable aspiration PNA. Septic shock-resolved. P:   Off ABX No new fevers, Leukocytosis resolved PCT <0.1   ENDOCRINE A:   Hypothyroidism. P:   Continue outpatient synthroid   TSH wnl  Family updated: No family present at bedside. Interdisciplinary Family Meeting v Palliative Care Meeting:  Due by:  07/14/15  Stable for transfer from ICU.  Marshell Garfinkel MD [facility] Pager [phone] If no answer or after 3pm call: [phone] 07/18/2015, 2:32 PM

## 2015-07-18 NOTE — Progress Notes (Signed)
Patient SBP >160 and DBP >110, wife at the bedside  Concern about DBP if >110 patient gets confused and symptoms of stroke will occur per patient's wife. Dr. Pennie Banter on call from e-link was notified with orders made. Will continue to monitor BP.

## 2015-07-19 DIAGNOSIS — G8190 Hemiplegia, unspecified affecting unspecified side: Secondary | ICD-10-CM

## 2015-07-19 DIAGNOSIS — E870 Hyperosmolality and hypernatremia: Secondary | ICD-10-CM

## 2015-07-19 DIAGNOSIS — R1310 Dysphagia, unspecified: Secondary | ICD-10-CM

## 2015-07-19 LAB — GLUCOSE, CAPILLARY
GLUCOSE-CAPILLARY: 101 mg/dL — AB (ref 65–99)
GLUCOSE-CAPILLARY: 107 mg/dL — AB (ref 65–99)
GLUCOSE-CAPILLARY: 111 mg/dL — AB (ref 65–99)
GLUCOSE-CAPILLARY: 94 mg/dL (ref 65–99)
GLUCOSE-CAPILLARY: 96 mg/dL (ref 65–99)
Glucose-Capillary: 91 mg/dL (ref 65–99)
Glucose-Capillary: 103 mg/dL — ABNORMAL HIGH (ref 65–99)

## 2015-07-19 LAB — BASIC METABOLIC PANEL
ANION GAP: 12 (ref 5–15)
BUN: 40 mg/dL — ABNORMAL HIGH (ref 6–20)
CHLORIDE: 110 mmol/L (ref 101–111)
CO2: 26 mmol/L (ref 22–32)
Calcium: 9.1 mg/dL (ref 8.9–10.3)
Creatinine, Ser: 1.13 mg/dL (ref 0.61–1.24)
GFR calc Af Amer: >60 mL/min (ref >60)
GLUCOSE: 112 mg/dL — AB (ref 65–99)
POTASSIUM: 3.5 mmol/L (ref 3.5–5.1)
SODIUM: 148 mmol/L — AB (ref 135–145)

## 2015-07-19 LAB — CBC
HCT: 44.8 % (ref 39.0–52.0)
HEMOGLOBIN: 14.6 g/dL (ref 13.0–17.0)
MCH: 32.3 pg (ref 26.0–34.0)
MCHC: 32.6 g/dL (ref 30.0–36.0)
MCV: 99.1 fL (ref 78.0–100.0)
PLATELETS: 265 10*3/uL (ref 150–400)
RBC: 4.52 MIL/uL (ref 4.22–5.81)
RDW: 13.4 % (ref 11.5–15.5)
WBC: 6.6 10*3/uL (ref 4.0–10.5)

## 2015-07-19 MED ORDER — KETOROLAC TROMETHAMINE 15 MG/ML IJ SOLN
15.0000 mg | Freq: Once | INTRAMUSCULAR | Status: DC
  Filled 2015-07-19: qty 1

## 2015-07-19 MED ORDER — SODIUM CHLORIDE 0.9 % IV SOLN
INTRAVENOUS | Status: DC
  Administered 2015-07-19 – 2015-07-20 (×2): via INTRAVENOUS

## 2015-07-19 NOTE — Progress Notes (Addendum)
Physical Therapy Treatment Patient Details Name: Benjamin Park MRN: [id number] DOB: Jun 02, 1940 Today's Date: 07/19/2015    History of Present Illness Patient is a 75 yo male admitted 07/02/2015 with hypertensive encephalopathy. Intubated due to hypoxia. Extubated 07/09/15. PMH:  TIA/CVA, SAH, MI, CAD, dysphagia, Parkinson's. Prior to discharge, pt noted to have desaturations on his O2 stats and concerns for aspiration. change in mental status, elevated BP to the 123456 systolic required intubation for airway protection and transferred to the ICU. 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       PT Comments    Patient s/p CVA and presents with left hemiplegia. Tolerated sitting EOB with variable assist (Max-Min) but does not demonstrate appropriate balance reactions with LOB. Pt with minimal verbalizations. [name] really wants pt to go to CIR however will need to see if pt can tolerate intensity. Palliative are to be consulted to assess goals of care before we determine appropriate disposition. Goals updated due to downgrade in functional status. Will follow acutely.   Follow Up Recommendations  CIR (although will need to see if this changes after goals of care meeting and ability to tolerate 3 hours.)     Equipment Recommendations  None recommended by PT    Recommendations for Other Services Rehab consult     Precautions / Restrictions Precautions Precautions: Fall Precaution Comments: right hand mitt Restrictions Weight Bearing Restrictions: No    Mobility  Bed Mobility Overal bed mobility: Needs Assistance Bed Mobility: Supine to Sit     Supine to sit: HOB elevated;Max assist Sit to supine: Max assist;+2 for physical assistance   General bed mobility comments: Increased time required. Assist with BLEs, trunk and to scoot bottom to EOB. Assist to bring BLEs into bed and lower trunk.  Transfers Overall transfer level:  (Deferred secondary to safety  concerns/poor trunk control.)                  Ambulation/Gait                 Stairs            Wheelchair Mobility    Modified Rankin (Stroke Patients Only) Modified Rankin (Stroke Patients Only) Pre-Morbid Rankin Score: Moderate disability Modified Rankin: Severe disability     Balance Overall balance assessment: Needs assistance Sitting-balance support: Feet supported;No upper extremity supported Sitting balance-Leahy Scale: Zero Sitting balance - Comments: Requires variable assist- Max A-Min A with manual cues for postural facilitation. Sat EOB ~ 15 mins.  LOB in all directions with no balance reactions.                            Cognition Arousal/Alertness: Awake/alert Behavior During Therapy: Flat affect (smiling appropriately and nodding yes/no to questions appropriately. Able to state name, who wife is.) Overall Cognitive Status: Difficult to assess                      Exercises      General Comments General comments (skin integrity, edema, etc.): Wife present during session. Discussed rehab options. [name] really wanting pt to go to CIR. LLE grossly ~1/5 DF, 1/5 knee extension, 1/5 knee flexion.      Pertinent Vitals/Pain Pain Assessment: Faces Faces Pain Scale: Hurts a little bit Pain Location: not able to state Pain Intervention(s): Monitored during session    Home Living  Prior Function            PT Goals (current goals can now be found in the care plan section) Progress towards PT goals: Progressing toward goals (very slowly)    Frequency  Min 3X/week    PT Plan Discharge plan needs to be updated    Co-evaluation             End of Session Equipment Utilized During Treatment: Gait belt Activity Tolerance: Patient limited by fatigue Patient left: in bed;with call bell/phone within reach;with bed alarm set;with family/visitor present     Time: 1030-1102 PT Time  Calculation (min) (ACUTE ONLY): 32 min  Charges:  $Therapeutic Activity: 8-22 mins                    G Codes:      Kadrian Partch A Makaila Windle 07/19/2015, 12:06 PM Wray Kearns, [place], DPT [phone]

## 2015-07-19 NOTE — Progress Notes (Addendum)
PROGRESS NOTE                                                                                                                                                                                                             Patient Demographics:    Benjamin Park, is a 75 y.o. male, DOB - [birth year], [id number]  Admit date - 07/09/2015   Admitting Physician Rush Farmer, MD  Outpatient Primary MD for the patient is No primary care provider on file.  LOS - 12  Outpatient Specialists:   No chief complaint on file.      Brief Narrative   75 year old male with history of uncontrolled hypertension, history of TIAs and stroke, frequent falls, sleep apnea, history of subarachnoid hemorrhage, MI with angioplasty, dysphagia, hypothyroidism, neuromuscular disorder, hyperlipidemia was brought to the hospital on 4/19 after he had slurred speech and unable to follow commands. He had history of hemorrhagic stroke previously with similar presentation. In the ED he had elevated blood pressure in the low 200s and was started on a new nicardipine drip but rate showed his blood pressure dropped into low 100s. Head CT was unremarkable for acute infarct or hemorrhage. Neurology evaluated the patient in the ED and while he was then he had acute change in mental status and became minimally responsive. Chest x-ray showed new left lower lobe infiltrate concerning for aspiration.  he was admitted to the ICU intubated. He was then transferred to medical floor on 4/21 after being successfully extubated. Started to improve and was planned for discharge on 4/25 but desaturated again with concern for repeat aspiration, became lethargic and blood pressure again elevated to [id number] systolic. Patient was again intubated for airway protection and transferred to ICU. Patient was then found to have left-sided hemiparesis. MRI was done which  upon review by stroke team  suggested to have bilateral frontal MCA, ACA infarct of embolic pattern. EEG done showed triphasic wave without seizure activity. Patient was extubated on 4/29. Patient continued to have left-sided weakness and failed swallow. Transferred to hospitalist service on 5/1.     Subjective:    Patient appears poorly oriented and difficult to communicate. Still has left-sided weakness. Failed swallow evaluation again.   Assessment  & Plan :   Principal problem Acute hypoxic respiratory failure Likely due to aspiration pneumonia. Required Intubation twice during  hospital course. Completed antibiotic course on 4/28. Remains nothing by mouth as he failed swallow evaluation recurrently. Getting medications through NG. Palliative care consulted for goals of care discussion.   Active Problems: Acute encephalopathy Likely a combination of aspiration pneumonia, malignant hypertension and CVA Patient alert and awake but communicating very poorly (per wife has severe word finding difficulty now). Aggressive blood pressure control. It appears that his mental status deteriorates every time his blood pressure increases. MRI suggests pt might have PRES neurology thinks he has stroke.  Acute bilateral frontal embolic stroke Source unclear. 2-D echo with normal EF and no cardiac source of emboli. Lower symmetry Doppler negative for DVT. Continue full dose aspirin and statin. Neurology recommends outpatient 30 day Holter monitoring upon discharge.  Uncontrolled hypertension with hypertensive emergency Continue scheduled labetalol and when necessary hydralazine. Blood pressure better controlled now   Hypothyroidism Continue Synthroid  Severe dysphagia Continue nothing by mouth. Gentle hydration. Feeding supplement through NG. Discussed about tube feedings with wife and she is undecided at this time.  Goals of care Overall prognosis seems poor. Patient's functional status is very poor. Discussed goals  of care with wife regarding aggressive care, ongoing aspiration risk, poor nutritional status and mobility . Discussed involving palliative care for goals of care discussion and wife agrees. Consult placed. She would like patient to receive aggressive care at this time. she will decide on feeding options after discussion with palliative care.  Code Status : Full code  Family Communication  : Wife at bedside  Disposition Plan  :  PT recommends CIR, but given his for ongoing functional status   Barriers For Discharge :  Ongoing symptoms. Palliative care discussion pending  Consults  :   PC CM Neurology Palliative care  Procedures  :    DVT Prophylaxis  :  SQ heparin  Lab Results  Component Value Date   PLT 265 07/19/2015    CULTURES: Blood 04/20 > NG final Sputum 04/20 > Klebsiella pneumoniae, Pseudomonas (sensitivities reported)  ANTIBIOTICS: Zosyn 04/20 >>>4/28 Vanc 4/20>>>4/22, restarted 4/25>>4/27    LINES/TUBES: ETT 04/20 >4/21, 4/25-29  Procedures Head CT MRI brain 2-D echo Doppler lower extremities EEG   Anti-infectives    Start     Dose/Rate Route Frequency Ordered Stop   07/13/15 1800  vancomycin (VANCOCIN) IVPB 1000 mg/200 mL premix  Status:  Discontinued     1,000 mg 200 mL/hr over 60 Minutes Intravenous Every 12 hours 07/13/15 1757 07/15/15 1040   07/08/15 1800  vancomycin (VANCOCIN) IVPB 750 mg/150 ml premix  Status:  Discontinued     750 mg 150 mL/hr over 60 Minutes Intravenous Every 12 hours 07/08/15 0050 07/09/15 0942   07/08/15 0800  piperacillin-tazobactam (ZOSYN) IVPB 3.375 g     3.375 g 12.5 mL/hr over 240 Minutes Intravenous Every 8 hours 07/08/15 0050 07/15/15 2359   07/08/15 0100  vancomycin (VANCOCIN) IVPB 1000 mg/200 mL premix     1,000 mg 200 mL/hr over 60 Minutes Intravenous  Once 07/08/15 0050 07/08/15 0809   07/08/15 0100  piperacillin-tazobactam (ZOSYN) IVPB 3.375 g  Status:  Discontinued     3.375 g 100 mL/hr over 30 Minutes  Intravenous  Once 07/08/15 0050 07/16/15 0750   07/08/15 0000  metroNIDAZOLE (FLAGYL) IVPB 500 mg     500 mg 100 mL/hr over 60 Minutes Intravenous  Once 06/30/2015 2359 07/08/15 1900   07/05/2015 2015  cefTRIAXone (ROCEPHIN) 1 g in dextrose 5 % 50 mL IVPB     1  g 100 mL/hr over 30 Minutes Intravenous  Once 06/20/2015 2012 06/27/2015 2152   06/21/2015 2015  azithromycin (ZITHROMAX) 500 mg in dextrose 5 % 250 mL IVPB     500 mg 250 mL/hr over 60 Minutes Intravenous  Once 07/02/2015 2012 07/04/2015 2318        Objective:   Filed Vitals:   07/19/15 0100 07/19/15 0458 07/19/15 0600 07/19/15 1000  BP: 91/71 114/79  128/98  Pulse: 73 96  103  Temp: 97.9 F (36.6 C) 97.9 F (36.6 C)  98.2 F (36.8 C)  TempSrc: Axillary Axillary  Axillary  Resp: 16 18  20   Height:      Weight:   64.774 kg (142 lb 12.8 oz)   SpO2: 100% 98%  96%    Wt Readings from Last 3 Encounters:  07/19/15 64.774 kg (142 lb 12.8 oz)  04/15/15 68.539 kg (151 lb 1.6 oz)  03/13/15 75.6 kg (166 lb 10.7 oz)     Intake/Output Summary (Last 24 hours) at 07/19/15 1334 Last data filed at 07/19/15 0501  Gross per 24 hour  Intake      0 ml  Output    800 ml  Net   -800 ml     Physical Exam  Gen: Very thin built male not in distress, poorly verbal, as NG in place HEENT: no pallor, dry mucosa, supple neck Chest: clear b/l, no added sounds CVS: N S1&S2, no murmurs, rubs or gallop GI: soft, NT, ND, BS+ Musculoskeletal: warm, no edema CNS: Alert and awake, poorly verbal, left hemiparesis    Data Review:    CBC  Recent Labs Lab 07/14/15 0243 07/15/15 0758 07/16/15 0254 07/18/15 0333 07/19/15 0407  WBC 14.0* 8.7 5.6 4.5 6.6  HGB 13.8 13.1 12.4* 14.1 14.6  HCT 42.2 39.6 38.0* 43.7 44.8  PLT 200 195 223 247 265  MCV 96.1 96.4 98.2 99.5 99.1  MCH 31.4 31.9 32.0 32.1 32.3  MCHC 32.7 33.1 32.6 32.3 32.6  RDW 13.0 13.2 13.3 13.3 13.4    Chemistries   Recent Labs Lab 07/14/15 0243 07/15/15 0758 07/16/15 0254  07/17/15 0104 07/18/15 0333 07/19/15 0407  NA 142 139 143 145 144 148*  K 3.8 3.4* 3.4* 3.5 3.4* 3.5  CL 103 105 108 108 109 110  CO2 28 25 27 26 26 26   GLUCOSE 128* 127* 107* 91 102* 112*  BUN 15 17 19 18  32* 40*  CREATININE 1.26* 0.94 0.97 0.84 1.07 1.13  CALCIUM 8.8* 8.4* 8.6* 8.8* 8.9 9.1  MG 2.0  --   --   --   --   --   AST 21  --   --   --   --   --   ALT 27  --   --   --   --   --   ALKPHOS 51  --   --   --   --   --   BILITOT 0.6  --   --   --   --   --    ------------------------------------------------------------------------------------------------------------------ No results for input(s): CHOL, HDL, LDLCALC, TRIG, CHOLHDL, LDLDIRECT in the last 72 hours.  Lab Results  Component Value Date   HGBA1C 5.6 07/16/2015   ------------------------------------------------------------------------------------------------------------------ No results for input(s): TSH, T4TOTAL, T3FREE, THYROIDAB in the last 72 hours.  Invalid input(s): FREET3 ------------------------------------------------------------------------------------------------------------------ No results for input(s): VITAMINB12, FOLATE, FERRITIN, TIBC, IRON, RETICCTPCT in the last 72 hours.  Coagulation profile No results for input(s): INR, PROTIME in the  last 168 hours.  No results for input(s): DDIMER in the last 72 hours.  Cardiac Enzymes  Recent Labs Lab 07/15/15 0138 07/15/15 0758 07/15/15 1128  TROPONINI 0.03 0.03 <0.03   ------------------------------------------------------------------------------------------------------------------ No results found for: BNP  Inpatient Medications  Scheduled Meds: . antiseptic oral rinse  7 mL Mouth Rinse q12n4p  . aspirin  325 mg Per Tube Daily  . chlorhexidine  15 mL Mouth Rinse BID  . feeding supplement (PRO-STAT SUGAR FREE 64)  30 mL Per Tube Daily  . heparin  5,000 Units Subcutaneous Q8H  . insulin aspart  0-9 Units Subcutaneous Q4H  . labetalol   100 mg Per Tube BID  . levothyroxine  112 mcg Per Tube QAC breakfast  . pantoprazole (PROTONIX) IV  40 mg Intravenous Q24H  . pravastatin  20 mg Per Tube q1800   Continuous Infusions: . sodium chloride 50 mL/hr at 07/19/15 1216  . feeding supplement (VITAL AF 1.2 CAL) 1,000 mL (07/15/15 2208)   PRN Meds:.sodium chloride, fentaNYL (SUBLIMAZE) injection, hydrALAZINE  Micro Results No results found for this or any previous visit (from the past 240 hour(s)).  Radiology Reports Ct Angio Head W/cm &/or Wo Cm  07/15/2015  CLINICAL DATA:  75 year old male with widespread superior hemisphere restricted diffusion on brain MRI yesterday possibly related to emboli or severe posterior reversible encephalopathy syndrome. Initial encounter. EXAM: CT ANGIOGRAPHY HEAD AND NECK TECHNIQUE: Multidetector CT imaging of the head and neck was performed using the standard protocol during bolus administration of intravenous contrast. Multiplanar CT image reconstructions and MIPs were obtained to evaluate the vascular anatomy. Carotid stenosis measurements (when applicable) are obtained utilizing NASCET criteria, using the distal internal carotid diameter as the denominator. CONTRAST:  50 mL Isovue 370 COMPARISON:  Brain MRI 07/14/2015. Head CT 07/13/2015. Intracranial MRA 02/01/2014 cervical spine CT 04/09/2012. FINDINGS: CT HEAD Brain: Abnormal cortical hypodensity in the bilateral superior frontal gyri, and to a lesser extent parietal lobes, corresponding to the abnormal diffusion. No associated hemorrhage or mass effect. Stable underlying cerebral volume. Stable gray-white matter differentiation elsewhere since 07/13/2015. No ventriculomegaly. Calvarium and skull base: Stable, negative. Paranasal sinuses: Stable left maxillary sinus opacification. Orbits: Stable, negative. CTA NECK Skeleton: Osteopenia. Exaggerated cervical lordosis with congenital appearing bilateral C6 lamina defects. Mild C6 compression vertebral  compression. The appearance is unchanged from 2014. No acute osseous abnormality identified. Other neck: Intubated and oral enteric tube in place. Endotracheal tube tip terminates above the carina. Enteric tube courses within the thoracic esophagus. Moderate apical pulmonary scarring. Areas of centrilobular emphysema in the right upper lobe. No superior mediastinal lymphadenopathy. Diminutive or absent thyroid. Negative parapharyngeal and retropharyngeal spaces. Negative sublingual space. Atrophied or surgically absent submandibular glands. There is also degree of bilateral parotid atrophy. No cervical lymphadenopathy. Aortic arch: 3 vessel arch configuration. Moderate soft and calcified arch atherosclerosis. Right carotid system: No brachiocephalic artery stenosis despite soft plaque. No right CCA origin stenosis. Mildly tortuous right CCA. Proximal to the right carotid bifurcation there is medial soft plaque in the right CCA resulting an less than 50 % stenosis with respect to the distal vessel. This appears to be mildly ulcerated (series 501, image 70). At the right carotid bifurcation there is soft and calcified plaque but no proximal right ICA stenosis. Soft and calcified plaque distal to the right ICA bulb without stenosis. The vessel remains patent to the skullbase. Left carotid system: No left CCA origin stenosis. Soft plaque proximal to the left carotid bifurcation without stenosis. Soft and calcified  plaque at the bifurcation without proximal left ICA stenosis. The left ICA is patent to the skullbase. Vertebral arteries: No proximal right subclavian artery stenosis despite soft and calcified plaque. No definite stenosis at the right vertebral artery origin. Patent right vertebral artery to the skullbase without stenosis. Bulky soft plaque in the proximal left subclavian artery resulting an stenosis up to 65-70 % with respect to the distal vessel (series 501, image 27). The left subclavian remains patent.  No stenosis at the left vertebral artery origin. Codominant vertebral arteries. No left vertebral artery stenosis to the skullbase. CTA HEAD Posterior circulation: Codominant distal vertebral arteries are patent without stenosis. Patent vertebrobasilar junction. Normal right PICA origin. Dominant appearing left AICA. Tortuous basilar artery without stenosis. Normal SCA and PCA origins. Normal bilateral PCA branches. Anterior circulation: Both ICA siphons are patent. There is soft plaque suspected in much of the right petrous segment without hemodynamically significant stenosis. Mild calcified plaque. Mild cavernous segment tortuosity. No siphon stenosis. Normal ophthalmic artery origins. Patent carotid termini. Normal MCA origins. Normal ACA origins. The left A1 is dominant. Anterior communicating artery and visualized bilateral ACA branches are within normal limits. Left MCA M1 segment, bifurcation, and left MCA branches are within normal limits. Right MCA M1 segment, bifurcation, and right MCA branches are within normal limits. Venous sinuses: Patent. Anatomic variants: None. Delayed phase: No abnormal enhancement identified. IMPRESSION: 1. Negative for emergent large vessel occlusion. Widespread atherosclerotic plaque, but no hemodynamically significant carotid, vertebral, or intracranial artery stenosis. 2. 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 3. Expected CT appearance of the superior hemispheric diffusion positive lesions seen by MRI yesterday. No associated hemorrhage or mass effect. 4. No acute findings in the neck. Endotracheal tube terminates above the carina, enteric tube courses within the thoracic esophagus. Electronically Signed   By: Genevie Ann M.D.   On: 07/15/2015 11:36   Dg Abd 1 View  07/17/2015  CLINICAL DATA:  75 year old male status post NG tube placement. EXAM: ABDOMEN - 1 VIEW COMPARISON:  03/12/2015. FINDINGS: Nasogastric tube coiled in the  proximal stomach. Gas is noted throughout the colon. Several nondilated gas-filled loops of small bowel are also noted. No gross evidence of pneumoperitoneum identified on this single supine view. IMPRESSION: 1. Tip of nasogastric tube is in the proximal stomach. Electronically Signed   By: Vinnie Langton M.D.   On: 07/17/2015 14:49   Ct Head Wo Contrast  07/13/2015  CLINICAL DATA:  Altered mental status EXAM: CT HEAD WITHOUT CONTRAST TECHNIQUE: Contiguous axial images were obtained from the base of the skull through the vertex without intravenous contrast. COMPARISON:  07/08/2015 FINDINGS: Bony calvarium is intact. Diffuse atrophic changes are noted as well as chronic white matter ischemic change. The overall appearance is similar to that seen on recent MRI examination. No acute hemorrhage, acute infarction or space-occupying mass lesion is seen IMPRESSION: Chronic atrophic and ischemic changes are noted. No acute abnormality seen. Electronically Signed   By: Inez Catalina M.D.   On: 07/13/2015 19:21   Ct Head Wo Contrast  07/08/2015  CLINICAL DATA:  Change in mental status. EXAM: CT HEAD WITHOUT CONTRAST TECHNIQUE: Contiguous axial images were obtained from the base of the skull through the vertex without intravenous contrast. COMPARISON:  CT 5-1/2 hours prior.  Brain MRI 03/13/2015 FINDINGS: No change from prior exam. Advanced cerebral and cerebellar atrophy again seen. Advanced chronic small vessel ischemia. No evidence of developing infarct. No interval hemorrhage. Unchanged opacification of the  left maxillary sinus, ethmoid air cell and frontal sinus to lesser extent. IMPRESSION: Advanced atrophy and chronic small vessel ischemia. No change from exam 5 hours prior. These results will be called to the ordering clinician or representative by the Radiologist Assistant, and communication documented in the PACS or zVision Dashboard. Electronically Signed   By: Jeb Levering M.D.   On: 07/08/2015 00:52    Ct Head Wo Contrast  07/12/2015  CLINICAL DATA:  Acute onset of confusion and aphasia.  Code stroke. EXAM: CT HEAD WITHOUT CONTRAST TECHNIQUE: Contiguous axial images were obtained from the base of the skull through the vertex without intravenous contrast. COMPARISON:  03/13/2015 FINDINGS: The brain shows advanced generalized atrophy. There are extensive chronic small vessel ischemic changes throughout the white matter. No identifiable acute infarction, mass lesion, hemorrhage, hydrocephalus or extra-axial collection. No calvarial abnormality. Sinuses are clear except for the left maxillary sinus which is opacified. There is atherosclerotic calcification of the major vessels at the base of the brain. IMPRESSION: No acute finding by CT. Advanced generalized brain atrophy and chronic small vessel ischemic changes. These results were called by telephone at the time of interpretation on 07/05/2015 at 6:54 pm to Dr. Gareth Morgan , who verbally acknowledged these results. Electronically Signed   By: Nelson Chimes M.D.   On: 07/08/2015 18:57   Ct Angio Neck W/cm &/or Wo/cm  07/15/2015  CLINICAL DATA:  75 year old male with widespread superior hemisphere restricted diffusion on brain MRI yesterday possibly related to emboli or severe posterior reversible encephalopathy syndrome. Initial encounter. EXAM: CT ANGIOGRAPHY HEAD AND NECK TECHNIQUE: Multidetector CT imaging of the head and neck was performed using the standard protocol during bolus administration of intravenous contrast. Multiplanar CT image reconstructions and MIPs were obtained to evaluate the vascular anatomy. Carotid stenosis measurements (when applicable) are obtained utilizing NASCET criteria, using the distal internal carotid diameter as the denominator. CONTRAST:  50 mL Isovue 370 COMPARISON:  Brain MRI 07/14/2015. Head CT 07/13/2015. Intracranial MRA 02/01/2014 cervical spine CT 04/09/2012. FINDINGS: CT HEAD Brain: Abnormal cortical hypodensity  in the bilateral superior frontal gyri, and to a lesser extent parietal lobes, corresponding to the abnormal diffusion. No associated hemorrhage or mass effect. Stable underlying cerebral volume. Stable gray-white matter differentiation elsewhere since 07/13/2015. No ventriculomegaly. Calvarium and skull base: Stable, negative. Paranasal sinuses: Stable left maxillary sinus opacification. Orbits: Stable, negative. CTA NECK Skeleton: Osteopenia. Exaggerated cervical lordosis with congenital appearing bilateral C6 lamina defects. Mild C6 compression vertebral compression. The appearance is unchanged from 2014. No acute osseous abnormality identified. Other neck: Intubated and oral enteric tube in place. Endotracheal tube tip terminates above the carina. Enteric tube courses within the thoracic esophagus. Moderate apical pulmonary scarring. Areas of centrilobular emphysema in the right upper lobe. No superior mediastinal lymphadenopathy. Diminutive or absent thyroid. Negative parapharyngeal and retropharyngeal spaces. Negative sublingual space. Atrophied or surgically absent submandibular glands. There is also degree of bilateral parotid atrophy. No cervical lymphadenopathy. Aortic arch: 3 vessel arch configuration. Moderate soft and calcified arch atherosclerosis. Right carotid system: No brachiocephalic artery stenosis despite soft plaque. No right CCA origin stenosis. Mildly tortuous right CCA. Proximal to the right carotid bifurcation there is medial soft plaque in the right CCA resulting an less than 50 % stenosis with respect to the distal vessel. This appears to be mildly ulcerated (series 501, image 70). At the right carotid bifurcation there is soft and calcified plaque but no proximal right ICA stenosis. Soft and calcified plaque distal to the right  ICA bulb without stenosis. The vessel remains patent to the skullbase. Left carotid system: No left CCA origin stenosis. Soft plaque proximal to the left carotid  bifurcation without stenosis. Soft and calcified plaque at the bifurcation without proximal left ICA stenosis. The left ICA is patent to the skullbase. Vertebral arteries: No proximal right subclavian artery stenosis despite soft and calcified plaque. No definite stenosis at the right vertebral artery origin. Patent right vertebral artery to the skullbase without stenosis. Bulky soft plaque in the proximal left subclavian artery resulting an stenosis up to 65-70 % with respect to the distal vessel (series 501, image 27). The left subclavian remains patent. No stenosis at the left vertebral artery origin. Codominant vertebral arteries. No left vertebral artery stenosis to the skullbase. CTA HEAD Posterior circulation: Codominant distal vertebral arteries are patent without stenosis. Patent vertebrobasilar junction. Normal right PICA origin. Dominant appearing left AICA. Tortuous basilar artery without stenosis. Normal SCA and PCA origins. Normal bilateral PCA branches. Anterior circulation: Both ICA siphons are patent. There is soft plaque suspected in much of the right petrous segment without hemodynamically significant stenosis. Mild calcified plaque. Mild cavernous segment tortuosity. No siphon stenosis. Normal ophthalmic artery origins. Patent carotid termini. Normal MCA origins. Normal ACA origins. The left A1 is dominant. Anterior communicating artery and visualized bilateral ACA branches are within normal limits. Left MCA M1 segment, bifurcation, and left MCA branches are within normal limits. Right MCA M1 segment, bifurcation, and right MCA branches are within normal limits. Venous sinuses: Patent. Anatomic variants: None. Delayed phase: No abnormal enhancement identified. IMPRESSION: 1. Negative for emergent large vessel occlusion. Widespread atherosclerotic plaque, but no hemodynamically significant carotid, vertebral, or intracranial artery stenosis. 2. There is high-grade proximal left subclavian artery  stenosis estimated at 65-70%. 3. Expected CT appearance of the superior hemispheric diffusion positive lesions seen by MRI yesterday. No associated hemorrhage or mass effect. 4. No acute findings in the neck. Endotracheal tube terminates above the carina, enteric tube courses within the thoracic esophagus. Electronically Signed   By: Genevie Ann M.D.   On: 07/15/2015 11:36   Mr Brain Wo Contrast  07/14/2015  CLINICAL DATA:  Hypertension and confusion, acute onset. EXAM: MRI HEAD WITHOUT CONTRAST TECHNIQUE: Multiplanar, multiecho pulse sequences of the brain and surrounding structures were obtained without intravenous contrast. COMPARISON:  Head CT 07/13/2015. MRI 07/08/2015. Multiple previous neuro imaging studies. FINDINGS: There are multiple newly seen areas of brain cortical and subcortical edema with mild swelling associated largely with restricted diffusion. These are most prominent along the surface of the brain running from front back at the vertex, right more than left. Chronic changes of brain atrophy an old small vessel insults within the brainstem, cerebellum and cerebral hemispheric white matter are again noted. Chronic abnormality of the corpus callosum is unchanged. I do not see any evidence of hemorrhage. No mass effect. No hydrocephalus. No extra-axial collection. No pituitary mass. There are inflammatory changes affecting the left maxillary sinus. IMPRESSION: Newly seen extensive areas of restricted diffusion and brain edema in a patchy pattern primarily at the vertices, right more than left. Differential diagnosis is that of an atypical manifestation of posterior reversible encephalopathy/hypertensive encephalopathy versus is embolic infarctions. Due to the history of multiple presentations in the past during episodes of severe hypertension, posterior reversible encephalopathy is favored. Electronically Signed   By: Nelson Chimes M.D.   On: 07/14/2015 19:18   Mr Brain Wo Contrast  07/08/2015   CLINICAL DATA:  Aphasia and change in mental  status. Symptoms began and yesterday. The patient then became unresponsive and was intubated. EXAM: MRI HEAD WITHOUT CONTRAST TECHNIQUE: Multiplanar, multiecho pulse sequences of the brain and surrounding structures were obtained without intravenous contrast. COMPARISON:  CT head without contrast 07/08/2015. MRI brain 01/11/2015. FINDINGS: Advanced atrophy and diffuse white matter changes are evident bilaterally. There is diffuse white matter change within both the splenium and the anterior genu of the corpus callosum. Diffuse atrophy of the corpus callosum is noted. These findings are similar the prior study. Increased T2 signal is present within these cerebellar peduncle is bilaterally. Focal susceptibility within the dentate nuclei is stable. There is significant T2 shine through on the diffusion weighted images, similar to the prior exams. No focal restricted diffusion is present on the ADC map sub to suggest acute or subacute infarction. No acute hemorrhage or mass lesion is present. The ventricles are proportionate to the degree of atrophy and stable since the prior study. No significant extra-axial fluid collection is present. Flow is present in the major intracranial arteries. The globes and orbits are intact. Chronic left maxillary sinus disease is present with near complete opacification of the left maxillary sinus. Asymmetric opacification of ethmoid air cells is noted on the left. Mild mucosal thickening is present in the sphenoid sinuses and frontal sinuses. There is some fluid in the mastoid air cells bilaterally without an obstructing nasopharyngeal lesion. Midline sagittal images demonstrate no focal lesions. Moderate degenerative changes are noted in the upper cervical spine, similar to the prior study. IMPRESSION: 1. No acute intracranial abnormality or significant interval change. 2. Advanced atrophy and diffuse white matter disease bilaterally.  Extensive subcortical white matter changes bilaterally are likely related to the remote ischemic disease. These findings are stable. 3. 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 Electronically Signed   By: San Morelle M.D.   On: 07/08/2015 17:05   Dg Chest Port 1 View  07/17/2015  CLINICAL DATA:  Patient with acute respiratory failure. EXAM: PORTABLE CHEST 1 VIEW COMPARISON:  Chest radiograph 07/15/2015. FINDINGS: Patient is rotated to the left. Interval extubation and removal of enteric tube. Stable enlarged cardiac and mediastinal contours. Small left pleural effusion. Underlying airspace opacities favored to represent atelectasis. Cardiac septal occluder device. IMPRESSION: Interval extubation and removal of enteric tube. Small left pleural effusion and underlying opacities favored to represent atelectasis, stable. Electronically Signed   By: Lovey Newcomer M.D.   On: 07/17/2015 07:47   Dg Chest Port 1 View  07/15/2015  CLINICAL DATA:  Ventilator patient.  Acute respiratory failure. EXAM: PORTABLE CHEST 1 VIEW COMPARISON:  07/14/2015 and 07/13/2015. FINDINGS: 0721 hours. The endotracheal and nasogastric tubes appear unchanged. The heart size and mediastinal contours are stable. Cardiac septal defect occluder device noted. The overall pulmonary aeration has improved. There is residual patchy left lower lobe airspace disease. No significant pleural effusion or pneumothorax. IMPRESSION: Overall improving aeration of the lungs with residual probable left lower lobe atelectasis. Electronically Signed   By: Richardean Sale M.D.   On: 07/15/2015 07:37   Dg Chest Port 1 View  07/14/2015  CLINICAL DATA:  Acute respiratory failure EXAM: PORTABLE CHEST 1 VIEW COMPARISON:  07/13/15 FINDINGS: Endotracheal tube and nasogastric catheter are again identified and stable in appearance. Patchy changes are again noted in the bases bilaterally left greater than right. No new focal abnormality is seen.  IMPRESSION: Stable appearance when compared with the prior exam. Electronically Signed   By: Inez Catalina M.D.   On: 07/14/2015 07:38  Dg Chest Port 1 View  07/13/2015  CLINICAL DATA:  Check endotracheal tube placement EXAM: PORTABLE CHEST 1 VIEW COMPARISON:  07/11/2015 FINDINGS: Cardiac shadow is within normal limits. The lungs are well aerated bilaterally. Patchy infiltrates are again seen particularly in the left mid lung and left base. Some suggestion of early infiltrate is noted in the right medial lung base. A nasogastric catheter is seen in the stomach. The endotracheal tube lies in the trachea approximately 4 cm above the carina. No bony abnormality is seen. IMPRESSION: Endotracheal tube and nasogastric catheter as described. Patchy infiltrative changes left greater than right. Electronically Signed   By: Inez Catalina M.D.   On: 07/13/2015 18:40   Dg Chest Port 1 View  07/11/2015  CLINICAL DATA:  Hypertension, coronary artery disease EXAM: PORTABLE CHEST 1 VIEW COMPARISON:  07/09/2015 FINDINGS: Interval extubation. RIGHT central venous line remains. Normal cardiac silhouette. There is LEFT lower lobe airspace disease not changed. No pneumothorax. IMPRESSION: 1. Interval extubation without complication. 2. Persistent LEFT lower lobe airspace opacity. Electronically Signed   By: Suzy Bouchard M.D.   On: 07/11/2015 09:04   Dg Chest Port 1 View  07/09/2015  CLINICAL DATA:  Aspiration pneumonia, intubated patient, history of TIA. EXAM: PORTABLE CHEST 1 VIEW COMPARISON:  Portable chest x-ray of July 08, 2015. FINDINGS: The right lung is adequately inflated and clear. On the left there is persistent retrocardiac and lower lobe density slightly more conspicuous today. The cardiac silhouette is normal in size. The pulmonary vascularity is not engorged. The endotracheal tube tip lies approximately 3.9 cm above the carina. The esophagogastric tube tip projects below the inferior margin of the image. The  right internal jugular venous catheter tip projects over the midportion of the SVC. IMPRESSION: Slight interval increase in left lower lobe parenchymal density consistent with aspiration pneumonia. The right lung is clear. There is no pulmonary edema. The support tubes are in reasonable position. Electronically Signed   By: David  Martinique M.D.   On: 07/09/2015 07:27   Dg Chest Port 1 View  07/08/2015  CLINICAL DATA:  Central line placement. EXAM: PORTABLE CHEST 1 VIEW COMPARISON:  Earlier film, same date. FINDINGS: The right IJ center venous catheter tip is in the distal SVC near the cavoatrial junction. The endotracheal tube and NG tubes are stable. The heart and lungs are relatively stable. Slight improved aeration in the left lower lobe. IMPRESSION: Right IJ center venous catheter tip is in the distal SVC near the cavoatrial junction. No complicating features. The other support apparatus is stable. Slight improved left lower lobe aeration. Electronically Signed   By: Marijo Sanes M.D.   On: 07/08/2015 10:51   Dg Chest Portable 1 View  07/08/2015  CLINICAL DATA:  Post intubation EXAM: PORTABLE CHEST 1 VIEW COMPARISON:  06/26/2015 FINDINGS: Interval placement of an endotracheal tube with tip measuring 3.6 cm above the carina. Enteric tube tip is not visualized but is below the left hemidiaphragm, likely in the distal stomach. Borderline heart size with normal pulmonary vascularity. Focal airspace consolidation in the left mid lung as before. Developing consolidation in the right upper lung. No blunting of costophrenic angles. No pneumothorax. Calcification of the aorta. IMPRESSION: Appliances appear in satisfactory position. Focal consolidation in the left mid lung with developing consolidation in the right upper lung. Electronically Signed   By: Lucienne Capers M.D.   On: 07/08/2015 01:12   Dg Chest Port 1 View  07/16/2015  CLINICAL DATA:  Pneumonia.  Shortness of  breath. EXAM: PORTABLE CHEST 1 VIEW  COMPARISON:  Earlier same day.  04/15/2015. FINDINGS: Compared to the study of only 4 hours ago, there is rapidly worsening infiltrate in the left lower lobe. This raises the possibility of aspiration. Right chest appears clear in this projection. Presumed Watchman device projects over the heart. IMPRESSION: Development of rapid airspace filling in the left lower lobe consistent with pneumonia. Rapid development suggests possible aspiration. Electronically Signed   By: Nelson Chimes M.D.   On: 07/10/2015 23:49   Dg Chest Portable 1 View  06/29/2015  CLINICAL DATA:  Code stroke.  Altered mental status. EXAM: PORTABLE CHEST 1 VIEW COMPARISON:  04/15/2015 FINDINGS: The patient has taken a poor inspiration. The heart is at the upper limits of normal in size. There is calcification of the thoracic aorta. Accentuated markings at the lung bases could relate to the poor inspiration. Mild basilar pneumonia not excluded on this portable exam. The upper lungs are clear. No evidence of heart failure or effusion. IMPRESSION: Poor inspiration. Prominent markings at the bases that could relate to the poor inspiration or could be secondary to basilar pneumonia. Electronically Signed   By: Nelson Chimes M.D.   On: 07/08/2015 19:45   Dg Abd Portable 1v  07/18/2015  CLINICAL DATA:  Tube placement EXAM: PORTABLE ABDOMEN - 1 VIEW COMPARISON:  07/17/2015 FINDINGS: The enteric tube extends into the stomach with tip in the region of the gastric fundus. IMPRESSION: Enteric tube extends into the stomach. Electronically Signed   By: Andreas Newport M.D.   On: 07/18/2015 23:28   Dg Swallowing Func-speech Pathology  07/12/2015  Objective Swallowing Evaluation: Type of Study: MBS-Modified Barium Swallow Study Patient Details Name: ROMOND NITZEL MRN: [id number] Date of Birth: 01-Feb-1941 Today's Date: 07/12/2015 Time: SLP Start Time (ACUTE ONLY): 1254-SLP Stop Time (ACUTE ONLY): 1307 SLP Time Calculation (min) (ACUTE ONLY): 13 min Past  Medical History: Past Medical History Diagnosis Date . Hx of transient ischemic attack (TIA)  . Hyperlipidemia  . ED (erectile dysfunction)  . Personal history of kidney stones  . Subarachnoid hemorrhage ([place])    '11- hospitalized -3 to 4 days(passed out-awoke in hospital)- no residual affects. . Myocardial infarction ([place])    12/1991 . Dysphagia  . Hypokalemia  . Hypothyroidism  . Sleep apnea    on CPAP-settings 6 . History of kidney stones 02-27-13   x5- none recent . Throat cancer [facility])    '00-radiation therapy . Stroke ([place])  . Unspecified cerebral artery occlusion with cerebral infarction 11/08/2012 . Episodic confusion 02-27-13   hx. of, not currently(multiple episodes- ? TIA's)-always saw MD to be evaluated. Marland Kitchen AKI (acute kidney injury) ([place]) 02/23/2011   pt. denies any problems 02-27-13 . Hypertension    hx. labile - with spikes every 3-6 mos.-last 8'14 ED visit. Marland Kitchen Hypertensive encephalopathy  . Headache(784.0)    episodic with periods of confusion -multiple . Neuromuscular disorder ([place])    "parkinson's" -no tremor-sees neurology MD at [facility] . Coronary artery disease    Dr. Martinique Past Surgical History: Past Surgical History Procedure Laterality Date . Angioplasty  1993   LAD . Cardiac catheterization   . Coronary angioplasty   . Basal cell cancer     head . Hernia repair   . Appendectomy   . Patent foramen ovale closure  2008 . Eye surgery   . Colonoscopy N/A 06/18/2012   Procedure: COLONOSCOPY;  Surgeon: Jeryl Columbia, MD;  Location: [facility] ENDOSCOPY;  Service: Endoscopy;  Laterality: N/A;  h&p in file-[place]  . Hot hemostasis N/A 06/18/2012   Procedure: HOT HEMOSTASIS (ARGON PLASMA COAGULATION/BICAP);  Surgeon: Jeryl Columbia, MD;  Location: [facility] ENDOSCOPY;  Service: Endoscopy;  Laterality: N/A; . Colonoscopy with propofol N/A 03/07/2013   Procedure: COLONOSCOPY WITH PROPOFOL;  Surgeon: Jeryl Columbia, MD;  Location: [facility] ENDOSCOPY;  Service: Endoscopy;  Laterality: N/A; HPI: 75 y.o. male with PMH as outlined below  including uncontrolled hypertension for which he has had multiple admissions. He was brought to [facility] ED 04/19 evening by his wife who informed staff that he was having a stroke due to the fact that he was unable to follow commands and had gargled speech. He has hx of hemorrhagic stroke and apparently presented very similarly at the time. He was last seen normal around 1pm earlier that afternoon. Wife stated that he never had any focal deficits, only gargled speech and inability follow commands.  Subjective: pt alert, pleasant, HOH, says he coughs all the time when eating and drinking Assessment / Plan / Recommendation CHL IP CLINICAL IMPRESSIONS 07/12/2015 Therapy Diagnosis Mild oral phase dysphagia;Severe pharyngeal phase dysphagia Clinical Impression Pt has a mild oral and severe pharyngeal dysphagia, which appears only mildly exacerbated from baseline swallowing function when compared to prior MBS studies. Oral phase is discoordinated with poor bolus cohesion and prolonged transit time, but ultimately with good clearance. He has a delay in swallow trigger with all consistencies reaching the pyriform sinuses, which results in aspiration of honey thick liquids and purees. Aspiration is intermittently sensed, with delayed and inconsistent coughing not entirely effective at clearing aspirates. With Max cues for a chin tuck, he is able to protect his airway with purees and Dys 2 textures. Mild residue remains in the valleculae, although it is not significantly changed with more solid boluses. Recommend Dys 2 diet and pudding thick liquids with full supervision for use of chin tuck. Impact on safety and function Moderate aspiration risk;Severe aspiration risk   CHL IP TREATMENT RECOMMENDATION 07/12/2015 Treatment Recommendations Therapy as outlined in treatment plan below   Prognosis 07/12/2015 Prognosis for Safe Diet Advancement Good Barriers to Reach Goals Cognitive deficits;Severity of deficits;Time post onset  Barriers/Prognosis Comment -- CHL IP DIET RECOMMENDATION 07/12/2015 SLP Diet Recommendations Dysphagia 2 (Fine chop) solids;Pudding thick liquid Liquid Administration via Spoon Medication Administration Crushed with puree Compensations Minimize environmental distractions;Slow rate;Small sips/bites;Chin tuck Postural Changes Remain semi-upright after after feeds/meals (Comment);Seated upright at 90 degrees   CHL IP OTHER RECOMMENDATIONS 07/12/2015 Recommended Consults -- Oral Care Recommendations Oral care BID Other Recommendations Order thickener from pharmacy;Prohibited food (jello, ice cream, thin soups);Remove water pitcher   CHL IP FOLLOW UP RECOMMENDATIONS 07/12/2015 Follow up Recommendations (No Data)   CHL IP FREQUENCY AND DURATION 07/12/2015 Speech Therapy Frequency (ACUTE ONLY) min 2x/week Treatment Duration 2 weeks      CHL IP ORAL PHASE 07/12/2015 Oral Phase Impaired Oral - Pudding Teaspoon -- Oral - Pudding Cup -- Oral - Honey Teaspoon Decreased bolus cohesion;Delayed oral transit Oral - Honey Cup -- Oral - Nectar Teaspoon -- Oral - Nectar Cup -- Oral - Nectar Straw -- Oral - Thin Teaspoon -- Oral - Thin Cup -- Oral - Thin Straw -- Oral - Puree Decreased bolus cohesion;Delayed oral transit Oral - Mech Soft Decreased bolus cohesion;Delayed oral transit Oral - Regular -- Oral - Multi-Consistency -- Oral - Pill -- Oral Phase - Comment --  CHL IP PHARYNGEAL PHASE 07/12/2015 Pharyngeal Phase Impaired Pharyngeal- Pudding Teaspoon -- Pharyngeal -- Pharyngeal- Pudding Cup --  Pharyngeal -- Pharyngeal- Honey Teaspoon Delayed swallow initiation-pyriform sinuses;Reduced anterior laryngeal mobility;Reduced laryngeal elevation;Reduced tongue base retraction;Penetration/Aspiration before swallow;Pharyngeal residue - valleculae;Compensatory strategies attempted (with notebox) Pharyngeal Material enters airway, passes BELOW cords without attempt by patient to eject out (silent aspiration) Pharyngeal- Honey Cup -- Pharyngeal --  Pharyngeal- Nectar Teaspoon -- Pharyngeal -- Pharyngeal- Nectar Cup -- Pharyngeal -- Pharyngeal- Nectar Straw -- Pharyngeal -- Pharyngeal- Thin Teaspoon -- Pharyngeal -- Pharyngeal- Thin Cup -- Pharyngeal -- Pharyngeal- Thin Straw -- Pharyngeal -- Pharyngeal- Puree Delayed swallow initiation-pyriform sinuses;Reduced anterior laryngeal mobility;Reduced laryngeal elevation;Reduced tongue base retraction;Penetration/Aspiration before swallow;Pharyngeal residue - valleculae;Compensatory strategies attempted (with notebox) Pharyngeal Material enters airway, passes BELOW cords without attempt by patient to eject out (silent aspiration) Pharyngeal- Mechanical Soft Delayed swallow initiation-pyriform sinuses;Reduced anterior laryngeal mobility;Reduced laryngeal elevation;Reduced tongue base retraction;Pharyngeal residue - valleculae;Compensatory strategies attempted (with notebox) Pharyngeal Material does not enter airway Pharyngeal- Regular -- Pharyngeal -- Pharyngeal- Multi-consistency -- Pharyngeal -- Pharyngeal- Pill -- Pharyngeal -- Pharyngeal Comment --  CHL IP CERVICAL ESOPHAGEAL PHASE 07/12/2015 Cervical Esophageal Phase Impaired Pudding Teaspoon -- Pudding Cup -- Honey Teaspoon Esophageal backflow into cervical esophagus Honey Cup -- Nectar Teaspoon -- Nectar Cup -- Nectar Straw -- Thin Teaspoon -- Thin Cup -- Thin Straw -- Puree Esophageal backflow into cervical esophagus Mechanical Soft Esophageal backflow into cervical esophagus Regular -- Multi-consistency -- Pill -- Cervical Esophageal Comment -- No flowsheet data found. [name], M.A. CCC-SLP [phone] [name] 07/12/2015, 1:44 PM               Time Spent in minutes  25   Louellen Molder M.D on 07/19/2015 at 1:34 PM  Between 7am to 7pm - Pager - [phone]  After 7pm go to [email or web address] - password [facility]  [facility] -  Office  [phone]

## 2015-07-19 NOTE — Progress Notes (Signed)
Nutrition Follow-up  DOCUMENTATION CODES:   Non-severe (moderate) malnutrition in context of chronic illness  INTERVENTION:  If pt appropriate for tube feedings based on goals of care, recommend the following TF regimen: Initiate Jevity 1.2 @ 20 ml/hr via NGT and increase by 10 ml every 4 hours to goal rate of 60 ml/hr.   Tube feeding regimen provides 1728 kcal (100% of needs), 80 grams of protein, and 1166 ml of H2O. Provide 100 ml free water flushes every 4 hours.    NUTRITION DIAGNOSIS:   Malnutrition related to chronic illness as evidenced by mild depletion of body fat, moderate depletion of body fat, moderate depletions of muscle mass, severe depletion of muscle mass, mild depletion of muscle mass.  Ongoing  GOAL:   Patient will meet greater than or equal to 90% of their needs  Unmet  MONITOR:   TF tolerance, Weight trends, Skin, I & O's, Labs, Other (Comment) (Goals of Care)  REASON FOR ASSESSMENT:   Consult Enteral/tube feeding initiation and management  ASSESSMENT:   75 y.o. male with PMH as outlined below including uncontrolled hypertension for which he has had multiple admissions. He was brought to [facility] ED 04/19 evening by his wife with AMS, garbled speech, vomiting. Found to have new LLL infiltrates, likely due to aspiration PNA.   Pt was extubated on 4/28. Pt asleep at time of visit; no family at bedside. Pt remains NPO and has large bore NGT in place. Per RN, pt pulled feeding tube yesterday, it was replaced last night and is only being used to administer medications at this time. Per RN, family needs to discuss goals of care and palliative medicine has been consulted.   Labs reviewed.   Diet Order:  Diet NPO time specified  Skin:  Reviewed, no issues  Last BM:  5/1  Height:   Ht Readings from Last 1 Encounters:  07/13/15 5\' 7"  (1.702 m)    Weight:   Wt Readings from Last 1 Encounters:  07/19/15 142 lb 12.8 oz (64.774 kg)    Ideal Body Weight:   67.3 kg  BMI:  Body mass index is 22.36 kg/(m^2).  Estimated Nutritional Needs:   Kcal:  1600-1800  Protein:  80-90 grams  Fluid:  1.8 L/day  EDUCATION NEEDS:   No education needs identified at this time  [place], LDN Inpatient Clinical Dietitian Pager: [phone] After Hours Pager: [phone]

## 2015-07-19 NOTE — Care Management Note (Signed)
Case Management Note  Patient Details  Name: RUSTY AMPARANO MRN: [id number] Date of Birth: [birth year]  Subjective/Objective:                    Action/Plan: Pt continues with feeding tube. PT rec is for CIR. CM following for d/c needs.   Expected Discharge Date:                  Expected Discharge Plan:  [place]  In-House Referral:  Clinical Social Work  Discharge planning Services  CM Consult  Post Acute Care Choice:  Home Health Choice offered to:  Spouse  DME Arranged:    DME Agency:     HH Arranged:  RN, PT [place] Agency:  [place]  Status of Service:  Completed, signed off  Medicare Important Message Given:  Other (see comment) Date Medicare IM Given:    Medicare IM give by:    Date Additional Medicare IM Given:    Additional Medicare Important Message give by:     If discussed at [place] of Stay Meetings, dates discussed:    Additional Comments:  Pollie Friar, RN 07/19/2015, 3:53 PM

## 2015-07-19 NOTE — Progress Notes (Signed)
Rehab Admissions Coordinator Note:  Patient was screened by Cleatrice Burke for appropriateness for an Inpatient Acute Rehab Consult per PT recommendation.   At this time, we are recommending Inpatient Rehab consult if aggressive care is to be pursued for intense therapies. Please advise.  Cleatrice Burke 07/19/2015, 1:10 PM  I can be reached at [phone].

## 2015-07-19 NOTE — Progress Notes (Signed)
Speech Language Pathology Treatment: Dysphagia  Patient Details Name: Benjamin Park MRN: [id number] DOB: 28-Sep-1940 Today's Date: 07/19/2015 Time: [id number] SLP Time Calculation (min) (ACUTE ONLY): 30 min  Assessment / Plan / Recommendation Clinical Impression  Pt seen for dysphagia therapy. Spouse present for a portion of the session. Pt awake and cooperative, but confused. Given severity of pharyngeal phase dysphagia with recent aspiration resulting in intubation/vent, pt was trialed with ice chips only. Pt demonstrated delayed pharyngeal response with consistent immediate cough. Ice chips were effective to loosen tenacious blood-tinged phlegm so it could be suctioned. Pt's spouse inquired when the patient might be able to eat, but recognized that his dysphagia has likely worsened from recent events and was quite severe prior to this. She was asking about ice chips and PEG. Discussed with the spouse that a palliative care consult might be helpful at this point to establish goals of care. Pt's spouse verbalized agreement with need for further discussion re: goals of care. At this point, recommend NPO. Might consider ice chips after oral care if pleasure feeding/comfort is a consideration. Will follow and reassess as able, but concern remains for pt's ability to effectively manage oral nutrition safely in the near future. RN to discuss possible palliative care consult with pt's physician.    HPI HPI: 75 y.o. male with PMH as outlined below including uncontrolled hypertension, Parkinsons' disease.  He was brought to [facility] ED 04/19 evening by his wife who informed staff that he was having a stroke due to the fact that he was unable to follow commands and had gargled speech. He has hx of hemorrhagic stroke.Pt has hx of throat cancer with radiation from 2000; he has long hx of dysphagia with repeated MBS studies and frequent aspiration.   Last study completed showed mild oral and severe pharyngeal phase  dysphagia.  Diet recommended was dys2/pudding.  Pt required intubation,  CXR showed LLL infiltrate likely related to aspiration.        SLP Plan  Continue with current plan of care     Recommendations  Diet recommendations: NPO Medication Administration: Via alternative means             General recommendations: Other(comment) (Palliative Care) Oral Care Recommendations: Oral care BID;Staff/trained caregiver to provide oral care Follow up Recommendations:  (TBD) Plan: Continue with current plan of care     [place] MA, [place] Pager [phone] 07/19/2015, 9:26 AM

## 2015-07-19 DEATH — deceased

## 2015-07-20 DIAGNOSIS — R1319 Other dysphagia: Secondary | ICD-10-CM

## 2015-07-20 DIAGNOSIS — I639 Cerebral infarction, unspecified: Secondary | ICD-10-CM

## 2015-07-20 DIAGNOSIS — Z7189 Other specified counseling: Secondary | ICD-10-CM

## 2015-07-20 DIAGNOSIS — Z515 Encounter for palliative care: Secondary | ICD-10-CM | POA: Insufficient documentation

## 2015-07-20 LAB — GLUCOSE, CAPILLARY
GLUCOSE-CAPILLARY: 98 mg/dL (ref 65–99)
Glucose-Capillary: 106 mg/dL — ABNORMAL HIGH (ref 65–99)
Glucose-Capillary: 86 mg/dL (ref 65–99)
Glucose-Capillary: 134 mg/dL — ABNORMAL HIGH (ref 65–99)
Glucose-Capillary: 115 mg/dL — ABNORMAL HIGH (ref 65–99)

## 2015-07-20 LAB — BASIC METABOLIC PANEL
Anion gap: 8 (ref 5–15)
BUN: 41 mg/dL — ABNORMAL HIGH (ref 6–20)
CALCIUM: 8.9 mg/dL (ref 8.9–10.3)
CO2: 28 mmol/L (ref 22–32)
CREATININE: 1.12 mg/dL (ref 0.61–1.24)
Chloride: 112 mmol/L — ABNORMAL HIGH (ref 101–111)
GFR calc non Af Amer: >60 mL/min (ref >60)
Glucose, Bld: 96 mg/dL (ref 65–99)
Potassium: 3.7 mmol/L (ref 3.5–5.1)
Sodium: 148 mmol/L — ABNORMAL HIGH (ref 135–145)

## 2015-07-20 MED ORDER — DEXTROSE 5 % IV SOLN
INTRAVENOUS | Status: DC
  Administered 2015-07-20: 08:00:00 via INTRAVENOUS

## 2015-07-20 MED ORDER — JEVITY 1.2 CAL PO LIQD
1000.0000 mL | ORAL | Status: DC
  Administered 2015-07-20: 1000 mL
  Filled 2015-07-20 (×6): qty 1000

## 2015-07-20 MED ORDER — HYDRALAZINE HCL 25 MG PO TABS
25.0000 mg | ORAL_TABLET | Freq: Three times a day (TID) | ORAL | Status: DC
  Administered 2015-07-20 – 2015-07-21 (×3): 25 mg via ORAL
  Filled 2015-07-20 (×5): qty 1

## 2015-07-20 NOTE — Progress Notes (Signed)
Physical Therapy Treatment Patient Details Name: Benjamin Park MRN: [id number] DOB: January 04, 1941 Today's Date: 07/20/2015    History of Present Illness Patient is a 75 yo male admitted 06/26/2015 with hypertensive encephalopathy. Intubated due to hypoxia. Extubated 07/09/15. PMH:  TIA/CVA, SAH, MI, CAD, dysphagia, Parkinson's. Prior to discharge, pt noted to have desaturations on his O2 stats and concerns for aspiration. change in mental status, elevated BP to the 123456 systolic required intubation for airway protection and transferred to the ICU. 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       PT Comments    Patient more alert today. Tolerated static and dynamic sitting balance with manual cues for postural facilitation. Pt fatigues. Able to initiate abdominals for anterior trunk lean but no balance reactions noted except to right side inconsistently. Nodding appropriately to yes/no questions and minimal verbalizations today. Not sure pt will be able to tolerate intensity of inpatient rehab. Discussed concern for wife however she is still hopeful. Will continue to follow.   Follow Up Recommendations  CIR     Equipment Recommendations  None recommended by PT    Recommendations for Other Services OT consult     Precautions / Restrictions Precautions Precautions: Fall Precaution Comments: right hand mitt Restrictions Weight Bearing Restrictions: No    Mobility  Bed Mobility Overal bed mobility: Needs Assistance Bed Mobility: Supine to Sit     Supine to sit: Max assist Sit to supine: Total assist;+2 for physical assistance   General bed mobility comments: Able to initiate movement of RLE but requires assist with LLE, trunk and scooting bottom to EOB.  Assist to bring BLEs into bed and lower trunk.  Transfers                    Ambulation/Gait                 Stairs            Wheelchair Mobility    Modified Rankin (Stroke  Patients Only) Modified Rankin (Stroke Patients Only) Pre-Morbid Rankin Score: Moderate disability Modified Rankin: Severe disability     Balance Overall balance assessment: Needs assistance Sitting-balance support: Feet supported;No upper extremity supported Sitting balance-Leahy Scale: Zero Sitting balance - Comments: Requires variable assist- Max A-Min A with manual cues for postural facilitation and upright posture.Sat EOB ~ 15 mins.  Able to initiate forward trunk lean. Focused on facilitating lateral trunk lean to right/left with WB through LUE. Postural control:  (everywhere)                          Cognition Arousal/Alertness: Awake/alert Behavior During Therapy: Flat affect Overall Cognitive Status: Difficult to assess Area of Impairment: Attention;Following commands   Current Attention Level: Sustained   Following Commands: Follows one step commands with increased time            Exercises      General Comments General comments (skin integrity, edema, etc.): Wife present during session. Wife does not want to give up on patient.       Pertinent Vitals/Pain Pain Assessment: No/denies pain    Home Living                      Prior Function            PT Goals (current goals can now be found in the care plan section) Progress towards PT goals: Progressing toward  goals (very slowly)    Frequency  Min 3X/week    PT Plan Current plan remains appropriate    Co-evaluation             End of Session   Activity Tolerance: Patient limited by fatigue Patient left: in bed;with call bell/phone within reach;with bed alarm set;with family/visitor present;with SCD's reapplied (left sidelying)     Time: [id number] PT Time Calculation (min) (ACUTE ONLY): 26 min  Charges:  $Therapeutic Activity: 8-22 mins $Neuromuscular Re-education: 8-22 mins                    G Codes:      Jae Bruck A Keylin Podolsky 07/20/2015, 4:26 PM Wray Kearns,  [place], DPT [phone]

## 2015-07-20 NOTE — Progress Notes (Addendum)
Nutrition Follow-up  DOCUMENTATION CODES:   Non-severe (moderate) malnutrition in context of chronic illness  INTERVENTION:  Recommend increasing rate of Jevity 1.2 to 30 ml/hr via NGT and increase by 10 ml every 4 hours to goal rate of 60 ml/hr.   Tube feeding regimen provides 1728 kcal (100% of needs), 80 grams of protein, and 1166 ml of H2O.  Recommend 100 ml free water flushes every 4 hours when IV fluids are discontinued.    NUTRITION DIAGNOSIS:   Malnutrition related to chronic illness as evidenced by mild depletion of body fat, moderate depletion of body fat, moderate depletions of muscle mass, severe depletion of muscle mass, mild depletion of muscle mass.  Ongoing  GOAL:   Patient will meet greater than or equal to 90% of their needs  Unmet  MONITOR:   TF tolerance, Weight trends, Skin, I & O's, Labs, Other (Comment) (Goals of Care)   ASSESSMENT:   75 y.o. male with PMH as outlined below including uncontrolled hypertension for which he has had multiple admissions. He was brought to [facility] ED 04/19 evening by his wife with AMS, garbled speech, vomiting. Found to have new LLL infiltrates, likely due to aspiration PNA.   Pt was extubated on 4/28. Remains NPO with NGT in place. Per palliative note, wife wishes to undergo PEG placement. Pt working with PT at time of visit.  Pt currently has Jevity 1.2 infusing at 20 ml/hr which was initiated this morning.   Labs: elevated sodium, stable glucose  Diet Order:  Diet NPO time specified  Skin:  Reviewed, no issues  Last BM:  5/1  Height:   Ht Readings from Last 1 Encounters:  07/13/15 5\' 7"  (1.702 m)    Weight:   Wt Readings from Last 1 Encounters:  07/20/15 152 lb 9.6 oz (69.219 kg)    Ideal Body Weight:  67.3 kg  BMI:  Body mass index is 23.89 kg/(m^2).  Estimated Nutritional Needs:   Kcal:  1600-1800  Protein:  80-90 grams  Fluid:  1.8 L/day  EDUCATION NEEDS:   No education needs identified at  this time  [place], LDN Inpatient Clinical Dietitian Pager: [phone] After Hours Pager: [phone]

## 2015-07-20 NOTE — Progress Notes (Signed)
PROGRESS NOTE                                                                                                                                                                                                             Patient Demographics:    Cai Phipps, is a 75 y.o. male, DOB - [birth year], DW:[id number]  Admit date - 07/06/2015   Admitting Physician Rush Farmer, MD  Outpatient Primary MD for the patient is No primary care provider on file.  LOS - 13  Outpatient Specialists:   No chief complaint on file.      Brief Narrative   75 year old male with history of uncontrolled hypertension, history of TIAs and stroke, frequent falls, sleep apnea, history of subarachnoid hemorrhage, MI with angioplasty, dysphagia, hypothyroidism, neuromuscular disorder, hyperlipidemia was brought to the hospital on 4/19 after he had slurred speech and unable to follow commands. He had history of hemorrhagic stroke previously with similar presentation. In the ED he had elevated blood pressure in the low 200s and was started on a new nicardipine drip but rate showed his blood pressure dropped into low 100s. Head CT was unremarkable for acute infarct or hemorrhage. Neurology evaluated the patient in the ED and while he was then he had acute change in mental status and became minimally responsive. Chest x-ray showed new left lower lobe infiltrate concerning for aspiration.  he was admitted to the ICU intubated. He was then transferred to medical floor on 4/21 after being successfully extubated. Started to improve and was planned for discharge on 4/25 but desaturated again with concern for repeat aspiration, became lethargic and blood pressure again elevated to [id number] systolic. Patient was again intubated for airway protection and transferred to ICU. Patient was then found to have left-sided hemiparesis. MRI was done which  upon review by stroke team  suggested to have bilateral frontal MCA, ACA infarct of embolic pattern. EEG done showed triphasic wave without seizure activity. Patient was extubated on 4/29. Patient continues to have left-sided weakness and failed swallow. Transferred to hospitalist service on 5/1.     Subjective:   Patient communicating little better today.   Assessment  & Plan :   Principal problem Acute hypoxic respiratory failure Likely due to aspiration pneumonia. Required Intubation twice during hospital course. Completed antibiotic course on 4/28.  failed swallow evaluation recurrently.Started  him on NG feeds today  Palliative care consulted for goals of care discussion.   Active Problems: Acute encephalopathy Likely a combination of aspiration pneumonia, malignant hypertension and CVA Patient alert and communicating better today, answering questions slowly. Aggressive blood pressure control. It appears that his mental status deteriorates every time his blood pressure increases. MRI suggests pt might have PRES but neurology thinks he has stroke. Continue labetalol. Will add schedule hydralazine.  Acute bilateral frontal embolic stroke Source unclear. 2-D echo with normal EF and no cardiac source of emboli. Lower extremity Doppler negative for DVT. Continue full dose aspirin and statin. Neurology recommends outpatient 30 day Holter monitoring upon discharge.  Uncontrolled hypertension with hypertensive emergency Continue scheduled labetalol . Blood pressure still uncontrolled. We'll add schedule hydralazine.  Hypothyroidism Continue Synthroid  Severe dysphagia Continue nothing by mouth. Gentle hydration. Feeding supplement through NG. Further discussion on long-term feeding after palliative care discussion.  Hypernatremia Place on D5.   Goals of care Overall prognosis seems poor.  Palliative care consulted regarding goals of care discussion, tube feeds etc.  Code Status : Full code  Family  Communication  : Wife at bedside  Disposition Plan  :  PT recommends CIR, but given his for ongoing functional status would recommend discussion with palliative care first.  Barriers For Discharge :  Ongoing symptoms. Palliative care discussion pending  Consults  :   PC CM Neurology Palliative care  Procedures  :    DVT Prophylaxis  :  SQ heparin  Lab Results  Component Value Date   PLT 265 07/19/2015    CULTURES: Blood 04/20 > NG final Sputum 04/20 > Klebsiella pneumoniae, Pseudomonas (sensitivities reported)  ANTIBIOTICS: Zosyn 04/20 >>>4/28 Vanc 4/20>>>4/22, restarted 4/25>>4/27    LINES/TUBES: ETT 04/20 >4/21, 4/25-29  Procedures Head CT MRI brain 2-D echo Doppler lower extremities EEG   Anti-infectives    Start     Dose/Rate Route Frequency Ordered Stop   07/13/15 1800  vancomycin (VANCOCIN) IVPB 1000 mg/200 mL premix  Status:  Discontinued     1,000 mg 200 mL/hr over 60 Minutes Intravenous Every 12 hours 07/13/15 1757 07/15/15 1040   07/08/15 1800  vancomycin (VANCOCIN) IVPB 750 mg/150 ml premix  Status:  Discontinued     750 mg 150 mL/hr over 60 Minutes Intravenous Every 12 hours 07/08/15 0050 07/09/15 0942   07/08/15 0800  piperacillin-tazobactam (ZOSYN) IVPB 3.375 g     3.375 g 12.5 mL/hr over 240 Minutes Intravenous Every 8 hours 07/08/15 0050 07/15/15 2359   07/08/15 0100  vancomycin (VANCOCIN) IVPB 1000 mg/200 mL premix     1,000 mg 200 mL/hr over 60 Minutes Intravenous  Once 07/08/15 0050 07/08/15 0809   07/08/15 0100  piperacillin-tazobactam (ZOSYN) IVPB 3.375 g  Status:  Discontinued     3.375 g 100 mL/hr over 30 Minutes Intravenous  Once 07/08/15 0050 07/16/15 0750   07/08/15 0000  metroNIDAZOLE (FLAGYL) IVPB 500 mg     500 mg 100 mL/hr over 60 Minutes Intravenous  Once 07/06/2015 2359 07/08/15 1900   07/09/2015 2015  cefTRIAXone (ROCEPHIN) 1 g in dextrose 5 % 50 mL IVPB     1 g 100 mL/hr over 30 Minutes Intravenous  Once 07/14/2015 2012  06/23/2015 2152   06/20/2015 2015  azithromycin (ZITHROMAX) 500 mg in dextrose 5 % 250 mL IVPB     500 mg 250 mL/hr over 60 Minutes Intravenous  Once 06/21/2015 2012 07/14/2015 2318        Objective:  Filed Vitals:   07/20/15 0200 07/20/15 0500 07/20/15 0624 07/20/15 0936  BP: 134/87 124/83  155/112  Pulse: 80 87  94  Temp: 98.1 F (36.7 C) 98 F (36.7 C)  98.2 F (36.8 C)  TempSrc: Axillary Axillary  Axillary  Resp: 20 20  20   Height:      Weight:  69.219 kg (152 lb 9.6 oz)    SpO2: 99% 99% 97% 95%    Wt Readings from Last 3 Encounters:  07/20/15 69.219 kg (152 lb 9.6 oz)  04/15/15 68.539 kg (151 lb 1.6 oz)  03/13/15 75.6 kg (166 lb 10.7 oz)     Intake/Output Summary (Last 24 hours) at 07/20/15 1209 Last data filed at 07/20/15 0657  Gross per 24 hour  Intake     70 ml  Output   1650 ml  Net  -1580 ml     Physical Exam  Gen: More alert and able to communicate better HEENT: NG in place,  dry mucosa, supple neck Chest: clear b/l, no added sounds CVS: N S1&S2, no murmurs, rubs or gallop GI: soft, NT, ND, BS+ Musculoskeletal: warm, no edema CNS: Alert and awake, answering few questions, left hemiparesis    Data Review:    CBC  Recent Labs Lab 07/14/15 0243 07/15/15 0758 07/16/15 0254 07/18/15 0333 07/19/15 0407  WBC 14.0* 8.7 5.6 4.5 6.6  HGB 13.8 13.1 12.4* 14.1 14.6  HCT 42.2 39.6 38.0* 43.7 44.8  PLT 200 195 223 247 265  MCV 96.1 96.4 98.2 99.5 99.1  MCH 31.4 31.9 32.0 32.1 32.3  MCHC 32.7 33.1 32.6 32.3 32.6  RDW 13.0 13.2 13.3 13.3 13.4    Chemistries   Recent Labs Lab 07/14/15 0243  07/16/15 0254 07/17/15 0104 07/18/15 0333 07/19/15 0407 07/20/15 0617  NA 142  < > 143 145 144 148* 148*  K 3.8  < > 3.4* 3.5 3.4* 3.5 3.7  CL 103  < > 108 108 109 110 112*  CO2 28  < > 27 26 26 26 28   GLUCOSE 128*  < > 107* 91 102* 112* 96  BUN 15  < > 19 18 32* 40* 41*  CREATININE 1.26*  < > 0.97 0.84 1.07 1.13 1.12  CALCIUM 8.8*  < > 8.6* 8.8* 8.9 9.1  8.9  MG 2.0  --   --   --   --   --   --   AST 21  --   --   --   --   --   --   ALT 27  --   --   --   --   --   --   ALKPHOS 51  --   --   --   --   --   --   BILITOT 0.6  --   --   --   --   --   --   < > = values in this interval not displayed. ------------------------------------------------------------------------------------------------------------------ No results for input(s): CHOL, HDL, LDLCALC, TRIG, CHOLHDL, LDLDIRECT in the last 72 hours.  Lab Results  Component Value Date   HGBA1C 5.6 07/16/2015   ------------------------------------------------------------------------------------------------------------------ No results for input(s): TSH, T4TOTAL, T3FREE, THYROIDAB in the last 72 hours.  Invalid input(s): FREET3 ------------------------------------------------------------------------------------------------------------------ No results for input(s): VITAMINB12, FOLATE, FERRITIN, TIBC, IRON, RETICCTPCT in the last 72 hours.  Coagulation profile No results for input(s): INR, PROTIME in the last 168 hours.  No results for input(s): DDIMER in the last 72 hours.  Cardiac Enzymes  Recent Labs Lab 07/15/15 0138 07/15/15 0758 07/15/15 1128  TROPONINI 0.03 0.03 <0.03   ------------------------------------------------------------------------------------------------------------------ No results found for: BNP  Inpatient Medications  Scheduled Meds: . antiseptic oral rinse  7 mL Mouth Rinse q12n4p  . aspirin  325 mg Per Tube Daily  . chlorhexidine  15 mL Mouth Rinse BID  . feeding supplement (JEVITY 1.2 CAL)  1,000 mL Per Tube Q24H  . heparin  5,000 Units Subcutaneous Q8H  . insulin aspart  0-9 Units Subcutaneous Q4H  . ketorolac  15 mg Intravenous Once  . labetalol  100 mg Per Tube BID  . levothyroxine  112 mcg Per Tube QAC breakfast  . pantoprazole (PROTONIX) IV  40 mg Intravenous Q24H  . pravastatin  20 mg Per Tube q1800   Continuous Infusions: . dextrose  50 mL/hr at 07/20/15 0820   PRN Meds:.sodium chloride, fentaNYL (SUBLIMAZE) injection, hydrALAZINE  Micro Results No results found for this or any previous visit (from the past 240 hour(s)).  Radiology Reports Ct Angio Head W/cm &/or Wo Cm  07/15/2015  CLINICAL DATA:  75 year old male with widespread superior hemisphere restricted diffusion on brain MRI yesterday possibly related to emboli or severe posterior reversible encephalopathy syndrome. Initial encounter. EXAM: CT ANGIOGRAPHY HEAD AND NECK TECHNIQUE: Multidetector CT imaging of the head and neck was performed using the standard protocol during bolus administration of intravenous contrast. Multiplanar CT image reconstructions and MIPs were obtained to evaluate the vascular anatomy. Carotid stenosis measurements (when applicable) are obtained utilizing NASCET criteria, using the distal internal carotid diameter as the denominator. CONTRAST:  50 mL Isovue 370 COMPARISON:  Brain MRI 07/14/2015. Head CT 07/13/2015. Intracranial MRA 02/01/2014 cervical spine CT 04/09/2012. FINDINGS: CT HEAD Brain: Abnormal cortical hypodensity in the bilateral superior frontal gyri, and to a lesser extent parietal lobes, corresponding to the abnormal diffusion. No associated hemorrhage or mass effect. Stable underlying cerebral volume. Stable gray-white matter differentiation elsewhere since 07/13/2015. No ventriculomegaly. Calvarium and skull base: Stable, negative. Paranasal sinuses: Stable left maxillary sinus opacification. Orbits: Stable, negative. CTA NECK Skeleton: Osteopenia. Exaggerated cervical lordosis with congenital appearing bilateral C6 lamina defects. Mild C6 compression vertebral compression. The appearance is unchanged from 2014. No acute osseous abnormality identified. Other neck: Intubated and oral enteric tube in place. Endotracheal tube tip terminates above the carina. Enteric tube courses within the thoracic esophagus. Moderate apical pulmonary  scarring. Areas of centrilobular emphysema in the right upper lobe. No superior mediastinal lymphadenopathy. Diminutive or absent thyroid. Negative parapharyngeal and retropharyngeal spaces. Negative sublingual space. Atrophied or surgically absent submandibular glands. There is also degree of bilateral parotid atrophy. No cervical lymphadenopathy. Aortic arch: 3 vessel arch configuration. Moderate soft and calcified arch atherosclerosis. Right carotid system: No brachiocephalic artery stenosis despite soft plaque. No right CCA origin stenosis. Mildly tortuous right CCA. Proximal to the right carotid bifurcation there is medial soft plaque in the right CCA resulting an less than 50 % stenosis with respect to the distal vessel. This appears to be mildly ulcerated (series 501, image 70). At the right carotid bifurcation there is soft and calcified plaque but no proximal right ICA stenosis. Soft and calcified plaque distal to the right ICA bulb without stenosis. The vessel remains patent to the skullbase. Left carotid system: No left CCA origin stenosis. Soft plaque proximal to the left carotid bifurcation without stenosis. Soft and calcified plaque at the bifurcation without proximal left ICA stenosis. The left ICA is patent to the skullbase. Vertebral arteries: No proximal  right subclavian artery stenosis despite soft and calcified plaque. No definite stenosis at the right vertebral artery origin. Patent right vertebral artery to the skullbase without stenosis. Bulky soft plaque in the proximal left subclavian artery resulting an stenosis up to 65-70 % with respect to the distal vessel (series 501, image 27). The left subclavian remains patent. No stenosis at the left vertebral artery origin. Codominant vertebral arteries. No left vertebral artery stenosis to the skullbase. CTA HEAD Posterior circulation: Codominant distal vertebral arteries are patent without stenosis. Patent vertebrobasilar junction. Normal right  PICA origin. Dominant appearing left AICA. Tortuous basilar artery without stenosis. Normal SCA and PCA origins. Normal bilateral PCA branches. Anterior circulation: Both ICA siphons are patent. There is soft plaque suspected in much of the right petrous segment without hemodynamically significant stenosis. Mild calcified plaque. Mild cavernous segment tortuosity. No siphon stenosis. Normal ophthalmic artery origins. Patent carotid termini. Normal MCA origins. Normal ACA origins. The left A1 is dominant. Anterior communicating artery and visualized bilateral ACA branches are within normal limits. Left MCA M1 segment, bifurcation, and left MCA branches are within normal limits. Right MCA M1 segment, bifurcation, and right MCA branches are within normal limits. Venous sinuses: Patent. Anatomic variants: None. Delayed phase: No abnormal enhancement identified. IMPRESSION: 1. Negative for emergent large vessel occlusion. Widespread atherosclerotic plaque, but no hemodynamically significant carotid, vertebral, or intracranial artery stenosis. 2. 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 3. Expected CT appearance of the superior hemispheric diffusion positive lesions seen by MRI yesterday. No associated hemorrhage or mass effect. 4. No acute findings in the neck. Endotracheal tube terminates above the carina, enteric tube courses within the thoracic esophagus. Electronically Signed   By: Genevie Ann M.D.   On: 07/15/2015 11:36   Dg Abd 1 View  07/17/2015  CLINICAL DATA:  75 year old male status post NG tube placement. EXAM: ABDOMEN - 1 VIEW COMPARISON:  03/12/2015. FINDINGS: Nasogastric tube coiled in the proximal stomach. Gas is noted throughout the colon. Several nondilated gas-filled loops of small bowel are also noted. No gross evidence of pneumoperitoneum identified on this single supine view. IMPRESSION: 1. Tip of nasogastric tube is in the proximal stomach. Electronically  Signed   By: Vinnie Langton M.D.   On: 07/17/2015 14:49   Ct Head Wo Contrast  07/13/2015  CLINICAL DATA:  Altered mental status EXAM: CT HEAD WITHOUT CONTRAST TECHNIQUE: Contiguous axial images were obtained from the base of the skull through the vertex without intravenous contrast. COMPARISON:  07/08/2015 FINDINGS: Bony calvarium is intact. Diffuse atrophic changes are noted as well as chronic white matter ischemic change. The overall appearance is similar to that seen on recent MRI examination. No acute hemorrhage, acute infarction or space-occupying mass lesion is seen IMPRESSION: Chronic atrophic and ischemic changes are noted. No acute abnormality seen. Electronically Signed   By: Inez Catalina M.D.   On: 07/13/2015 19:21   Ct Head Wo Contrast  07/08/2015  CLINICAL DATA:  Change in mental status. EXAM: CT HEAD WITHOUT CONTRAST TECHNIQUE: Contiguous axial images were obtained from the base of the skull through the vertex without intravenous contrast. COMPARISON:  CT 5-1/2 hours prior.  Brain MRI 03/13/2015 FINDINGS: No change from prior exam. Advanced cerebral and cerebellar atrophy again seen. Advanced chronic small vessel ischemia. No evidence of developing infarct. No interval hemorrhage. Unchanged opacification of the left maxillary sinus, ethmoid air cell and frontal sinus to lesser extent. IMPRESSION: Advanced atrophy and chronic small vessel ischemia. No  change from exam 5 hours prior. These results will be called to the ordering clinician or representative by the Radiologist Assistant, and communication documented in the PACS or zVision Dashboard. Electronically Signed   By: Jeb Levering M.D.   On: 07/08/2015 00:52   Ct Head Wo Contrast  07/04/2015  CLINICAL DATA:  Acute onset of confusion and aphasia.  Code stroke. EXAM: CT HEAD WITHOUT CONTRAST TECHNIQUE: Contiguous axial images were obtained from the base of the skull through the vertex without intravenous contrast. COMPARISON:   03/13/2015 FINDINGS: The brain shows advanced generalized atrophy. There are extensive chronic small vessel ischemic changes throughout the white matter. No identifiable acute infarction, mass lesion, hemorrhage, hydrocephalus or extra-axial collection. No calvarial abnormality. Sinuses are clear except for the left maxillary sinus which is opacified. There is atherosclerotic calcification of the major vessels at the base of the brain. IMPRESSION: No acute finding by CT. Advanced generalized brain atrophy and chronic small vessel ischemic changes. These results were called by telephone at the time of interpretation on 06/22/2015 at 6:54 pm to Dr. Gareth Morgan , who verbally acknowledged these results. Electronically Signed   By: Nelson Chimes M.D.   On: 06/19/2015 18:57   Ct Angio Neck W/cm &/or Wo/cm  07/15/2015  CLINICAL DATA:  75 year old male with widespread superior hemisphere restricted diffusion on brain MRI yesterday possibly related to emboli or severe posterior reversible encephalopathy syndrome. Initial encounter. EXAM: CT ANGIOGRAPHY HEAD AND NECK TECHNIQUE: Multidetector CT imaging of the head and neck was performed using the standard protocol during bolus administration of intravenous contrast. Multiplanar CT image reconstructions and MIPs were obtained to evaluate the vascular anatomy. Carotid stenosis measurements (when applicable) are obtained utilizing NASCET criteria, using the distal internal carotid diameter as the denominator. CONTRAST:  50 mL Isovue 370 COMPARISON:  Brain MRI 07/14/2015. Head CT 07/13/2015. Intracranial MRA 02/01/2014 cervical spine CT 04/09/2012. FINDINGS: CT HEAD Brain: Abnormal cortical hypodensity in the bilateral superior frontal gyri, and to a lesser extent parietal lobes, corresponding to the abnormal diffusion. No associated hemorrhage or mass effect. Stable underlying cerebral volume. Stable gray-white matter differentiation elsewhere since 07/13/2015. No  ventriculomegaly. Calvarium and skull base: Stable, negative. Paranasal sinuses: Stable left maxillary sinus opacification. Orbits: Stable, negative. CTA NECK Skeleton: Osteopenia. Exaggerated cervical lordosis with congenital appearing bilateral C6 lamina defects. Mild C6 compression vertebral compression. The appearance is unchanged from 2014. No acute osseous abnormality identified. Other neck: Intubated and oral enteric tube in place. Endotracheal tube tip terminates above the carina. Enteric tube courses within the thoracic esophagus. Moderate apical pulmonary scarring. Areas of centrilobular emphysema in the right upper lobe. No superior mediastinal lymphadenopathy. Diminutive or absent thyroid. Negative parapharyngeal and retropharyngeal spaces. Negative sublingual space. Atrophied or surgically absent submandibular glands. There is also degree of bilateral parotid atrophy. No cervical lymphadenopathy. Aortic arch: 3 vessel arch configuration. Moderate soft and calcified arch atherosclerosis. Right carotid system: No brachiocephalic artery stenosis despite soft plaque. No right CCA origin stenosis. Mildly tortuous right CCA. Proximal to the right carotid bifurcation there is medial soft plaque in the right CCA resulting an less than 50 % stenosis with respect to the distal vessel. This appears to be mildly ulcerated (series 501, image 70). At the right carotid bifurcation there is soft and calcified plaque but no proximal right ICA stenosis. Soft and calcified plaque distal to the right ICA bulb without stenosis. The vessel remains patent to the skullbase. Left carotid system: No left CCA origin stenosis. Soft plaque  proximal to the left carotid bifurcation without stenosis. Soft and calcified plaque at the bifurcation without proximal left ICA stenosis. The left ICA is patent to the skullbase. Vertebral arteries: No proximal right subclavian artery stenosis despite soft and calcified plaque. No definite  stenosis at the right vertebral artery origin. Patent right vertebral artery to the skullbase without stenosis. Bulky soft plaque in the proximal left subclavian artery resulting an stenosis up to 65-70 % with respect to the distal vessel (series 501, image 27). The left subclavian remains patent. No stenosis at the left vertebral artery origin. Codominant vertebral arteries. No left vertebral artery stenosis to the skullbase. CTA HEAD Posterior circulation: Codominant distal vertebral arteries are patent without stenosis. Patent vertebrobasilar junction. Normal right PICA origin. Dominant appearing left AICA. Tortuous basilar artery without stenosis. Normal SCA and PCA origins. Normal bilateral PCA branches. Anterior circulation: Both ICA siphons are patent. There is soft plaque suspected in much of the right petrous segment without hemodynamically significant stenosis. Mild calcified plaque. Mild cavernous segment tortuosity. No siphon stenosis. Normal ophthalmic artery origins. Patent carotid termini. Normal MCA origins. Normal ACA origins. The left A1 is dominant. Anterior communicating artery and visualized bilateral ACA branches are within normal limits. Left MCA M1 segment, bifurcation, and left MCA branches are within normal limits. Right MCA M1 segment, bifurcation, and right MCA branches are within normal limits. Venous sinuses: Patent. Anatomic variants: None. Delayed phase: No abnormal enhancement identified. IMPRESSION: 1. Negative for emergent large vessel occlusion. Widespread atherosclerotic plaque, but no hemodynamically significant carotid, vertebral, or intracranial artery stenosis. 2. 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 3. Expected CT appearance of the superior hemispheric diffusion positive lesions seen by MRI yesterday. No associated hemorrhage or mass effect. 4. No acute findings in the neck. Endotracheal tube terminates above the carina, enteric  tube courses within the thoracic esophagus. Electronically Signed   By: Genevie Ann M.D.   On: 07/15/2015 11:36   Mr Brain Wo Contrast  07/14/2015  CLINICAL DATA:  Hypertension and confusion, acute onset. EXAM: MRI HEAD WITHOUT CONTRAST TECHNIQUE: Multiplanar, multiecho pulse sequences of the brain and surrounding structures were obtained without intravenous contrast. COMPARISON:  Head CT 07/13/2015. MRI 07/08/2015. Multiple previous neuro imaging studies. FINDINGS: There are multiple newly seen areas of brain cortical and subcortical edema with mild swelling associated largely with restricted diffusion. These are most prominent along the surface of the brain running from front back at the vertex, right more than left. Chronic changes of brain atrophy an old small vessel insults within the brainstem, cerebellum and cerebral hemispheric white matter are again noted. Chronic abnormality of the corpus callosum is unchanged. I do not see any evidence of hemorrhage. No mass effect. No hydrocephalus. No extra-axial collection. No pituitary mass. There are inflammatory changes affecting the left maxillary sinus. IMPRESSION: Newly seen extensive areas of restricted diffusion and brain edema in a patchy pattern primarily at the vertices, right more than left. Differential diagnosis is that of an atypical manifestation of posterior reversible encephalopathy/hypertensive encephalopathy versus is embolic infarctions. Due to the history of multiple presentations in the past during episodes of severe hypertension, posterior reversible encephalopathy is favored. Electronically Signed   By: Nelson Chimes M.D.   On: 07/14/2015 19:18   Mr Brain Wo Contrast  07/08/2015  CLINICAL DATA:  Aphasia and change in mental status. Symptoms began and yesterday. The patient then became unresponsive and was intubated. EXAM: MRI HEAD WITHOUT CONTRAST TECHNIQUE: Multiplanar, multiecho pulse  sequences of the brain and surrounding structures were  obtained without intravenous contrast. COMPARISON:  CT head without contrast 07/08/2015. MRI brain 01/11/2015. FINDINGS: Advanced atrophy and diffuse white matter changes are evident bilaterally. There is diffuse white matter change within both the splenium and the anterior genu of the corpus callosum. Diffuse atrophy of the corpus callosum is noted. These findings are similar the prior study. Increased T2 signal is present within these cerebellar peduncle is bilaterally. Focal susceptibility within the dentate nuclei is stable. There is significant T2 shine through on the diffusion weighted images, similar to the prior exams. No focal restricted diffusion is present on the ADC map sub to suggest acute or subacute infarction. No acute hemorrhage or mass lesion is present. The ventricles are proportionate to the degree of atrophy and stable since the prior study. No significant extra-axial fluid collection is present. Flow is present in the major intracranial arteries. The globes and orbits are intact. Chronic left maxillary sinus disease is present with near complete opacification of the left maxillary sinus. Asymmetric opacification of ethmoid air cells is noted on the left. Mild mucosal thickening is present in the sphenoid sinuses and frontal sinuses. There is some fluid in the mastoid air cells bilaterally without an obstructing nasopharyngeal lesion. Midline sagittal images demonstrate no focal lesions. Moderate degenerative changes are noted in the upper cervical spine, similar to the prior study. IMPRESSION: 1. No acute intracranial abnormality or significant interval change. 2. Advanced atrophy and diffuse white matter disease bilaterally. Extensive subcortical white matter changes bilaterally are likely related to the remote ischemic disease. These findings are stable. 3. 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 Electronically Signed   By: San Morelle M.D.   On: 07/08/2015 17:05   Dg  Chest Port 1 View  07/17/2015  CLINICAL DATA:  Patient with acute respiratory failure. EXAM: PORTABLE CHEST 1 VIEW COMPARISON:  Chest radiograph 07/15/2015. FINDINGS: Patient is rotated to the left. Interval extubation and removal of enteric tube. Stable enlarged cardiac and mediastinal contours. Small left pleural effusion. Underlying airspace opacities favored to represent atelectasis. Cardiac septal occluder device. IMPRESSION: Interval extubation and removal of enteric tube. Small left pleural effusion and underlying opacities favored to represent atelectasis, stable. Electronically Signed   By: Lovey Newcomer M.D.   On: 07/17/2015 07:47   Dg Chest Port 1 View  07/15/2015  CLINICAL DATA:  Ventilator patient.  Acute respiratory failure. EXAM: PORTABLE CHEST 1 VIEW COMPARISON:  07/14/2015 and 07/13/2015. FINDINGS: 0721 hours. The endotracheal and nasogastric tubes appear unchanged. The heart size and mediastinal contours are stable. Cardiac septal defect occluder device noted. The overall pulmonary aeration has improved. There is residual patchy left lower lobe airspace disease. No significant pleural effusion or pneumothorax. IMPRESSION: Overall improving aeration of the lungs with residual probable left lower lobe atelectasis. Electronically Signed   By: Richardean Sale M.D.   On: 07/15/2015 07:37   Dg Chest Port 1 View  07/14/2015  CLINICAL DATA:  Acute respiratory failure EXAM: PORTABLE CHEST 1 VIEW COMPARISON:  07/13/15 FINDINGS: Endotracheal tube and nasogastric catheter are again identified and stable in appearance. Patchy changes are again noted in the bases bilaterally left greater than right. No new focal abnormality is seen. IMPRESSION: Stable appearance when compared with the prior exam. Electronically Signed   By: Inez Catalina M.D.   On: 07/14/2015 07:38   Dg Chest Port 1 View  07/13/2015  CLINICAL DATA:  Check endotracheal tube placement EXAM: PORTABLE CHEST 1 VIEW COMPARISON:  07/11/2015  FINDINGS: Cardiac shadow is within normal limits. The lungs are well aerated bilaterally. Patchy infiltrates are again seen particularly in the left mid lung and left base. Some suggestion of early infiltrate is noted in the right medial lung base. A nasogastric catheter is seen in the stomach. The endotracheal tube lies in the trachea approximately 4 cm above the carina. No bony abnormality is seen. IMPRESSION: Endotracheal tube and nasogastric catheter as described. Patchy infiltrative changes left greater than right. Electronically Signed   By: Inez Catalina M.D.   On: 07/13/2015 18:40   Dg Chest Port 1 View  07/11/2015  CLINICAL DATA:  Hypertension, coronary artery disease EXAM: PORTABLE CHEST 1 VIEW COMPARISON:  07/09/2015 FINDINGS: Interval extubation. RIGHT central venous line remains. Normal cardiac silhouette. There is LEFT lower lobe airspace disease not changed. No pneumothorax. IMPRESSION: 1. Interval extubation without complication. 2. Persistent LEFT lower lobe airspace opacity. Electronically Signed   By: Suzy Bouchard M.D.   On: 07/11/2015 09:04   Dg Chest Port 1 View  07/09/2015  CLINICAL DATA:  Aspiration pneumonia, intubated patient, history of TIA. EXAM: PORTABLE CHEST 1 VIEW COMPARISON:  Portable chest x-ray of July 08, 2015. FINDINGS: The right lung is adequately inflated and clear. On the left there is persistent retrocardiac and lower lobe density slightly more conspicuous today. The cardiac silhouette is normal in size. The pulmonary vascularity is not engorged. The endotracheal tube tip lies approximately 3.9 cm above the carina. The esophagogastric tube tip projects below the inferior margin of the image. The right internal jugular venous catheter tip projects over the midportion of the SVC. IMPRESSION: Slight interval increase in left lower lobe parenchymal density consistent with aspiration pneumonia. The right lung is clear. There is no pulmonary edema. The support tubes are in  reasonable position. Electronically Signed   By: David  Martinique M.D.   On: 07/09/2015 07:27   Dg Chest Port 1 View  07/08/2015  CLINICAL DATA:  Central line placement. EXAM: PORTABLE CHEST 1 VIEW COMPARISON:  Earlier film, same date. FINDINGS: The right IJ center venous catheter tip is in the distal SVC near the cavoatrial junction. The endotracheal tube and NG tubes are stable. The heart and lungs are relatively stable. Slight improved aeration in the left lower lobe. IMPRESSION: Right IJ center venous catheter tip is in the distal SVC near the cavoatrial junction. No complicating features. The other support apparatus is stable. Slight improved left lower lobe aeration. Electronically Signed   By: Marijo Sanes M.D.   On: 07/08/2015 10:51   Dg Chest Portable 1 View  07/08/2015  CLINICAL DATA:  Post intubation EXAM: PORTABLE CHEST 1 VIEW COMPARISON:  07/04/2015 FINDINGS: Interval placement of an endotracheal tube with tip measuring 3.6 cm above the carina. Enteric tube tip is not visualized but is below the left hemidiaphragm, likely in the distal stomach. Borderline heart size with normal pulmonary vascularity. Focal airspace consolidation in the left mid lung as before. Developing consolidation in the right upper lung. No blunting of costophrenic angles. No pneumothorax. Calcification of the aorta. IMPRESSION: Appliances appear in satisfactory position. Focal consolidation in the left mid lung with developing consolidation in the right upper lung. Electronically Signed   By: Lucienne Capers M.D.   On: 07/08/2015 01:12   Dg Chest Port 1 View  06/27/2015  CLINICAL DATA:  Pneumonia.  Shortness of breath. EXAM: PORTABLE CHEST 1 VIEW COMPARISON:  Earlier same day.  04/15/2015. FINDINGS: Compared to the study of only 4  hours ago, there is rapidly worsening infiltrate in the left lower lobe. This raises the possibility of aspiration. Right chest appears clear in this projection. Presumed Watchman device projects  over the heart. IMPRESSION: Development of rapid airspace filling in the left lower lobe consistent with pneumonia. Rapid development suggests possible aspiration. Electronically Signed   By: Nelson Chimes M.D.   On: 07/03/2015 23:49   Dg Chest Portable 1 View  07/08/2015  CLINICAL DATA:  Code stroke.  Altered mental status. EXAM: PORTABLE CHEST 1 VIEW COMPARISON:  04/15/2015 FINDINGS: The patient has taken a poor inspiration. The heart is at the upper limits of normal in size. There is calcification of the thoracic aorta. Accentuated markings at the lung bases could relate to the poor inspiration. Mild basilar pneumonia not excluded on this portable exam. The upper lungs are clear. No evidence of heart failure or effusion. IMPRESSION: Poor inspiration. Prominent markings at the bases that could relate to the poor inspiration or could be secondary to basilar pneumonia. Electronically Signed   By: Nelson Chimes M.D.   On: 06/30/2015 19:45   Dg Abd Portable 1v  07/18/2015  CLINICAL DATA:  Tube placement EXAM: PORTABLE ABDOMEN - 1 VIEW COMPARISON:  07/17/2015 FINDINGS: The enteric tube extends into the stomach with tip in the region of the gastric fundus. IMPRESSION: Enteric tube extends into the stomach. Electronically Signed   By: Andreas Newport M.D.   On: 07/18/2015 23:28   Dg Swallowing Func-speech Pathology  07/12/2015  Objective Swallowing Evaluation: Type of Study: MBS-Modified Barium Swallow Study Patient Details Name: BARTT HA MRN: [id number] Date of Birth: [birth year] Today's Date: 07/12/2015 Time: SLP Start Time (ACUTE ONLY): 1254-SLP Stop Time (ACUTE ONLY): 1307 SLP Time Calculation (min) (ACUTE ONLY): 13 min Past Medical History: Past Medical History Diagnosis Date . Hx of transient ischemic attack (TIA)  . Hyperlipidemia  . ED (erectile dysfunction)  . Personal history of kidney stones  . Subarachnoid hemorrhage ([place])    '11- hospitalized -3 to 4 days(passed out-awoke in hospital)- no  residual affects. . Myocardial infarction ([place])    12/1991 . Dysphagia  . Hypokalemia  . Hypothyroidism  . Sleep apnea    on CPAP-settings 6 . History of kidney stones 02-27-13   x5- none recent . Throat cancer [facility])    '00-radiation therapy . Stroke ([place])  . Unspecified cerebral artery occlusion with cerebral infarction 11/08/2012 . Episodic confusion 02-27-13   hx. of, not currently(multiple episodes- ? TIA's)-always saw MD to be evaluated. Marland Kitchen AKI (acute kidney injury) ([place]) 02/23/2011   pt. denies any problems 02-27-13 . Hypertension    hx. labile - with spikes every 3-6 mos.-last 8'14 ED visit. Marland Kitchen Hypertensive encephalopathy  . Headache(784.0)    episodic with periods of confusion -multiple . Neuromuscular disorder ([place])    "parkinson's" -no tremor-sees neurology MD at [facility] . Coronary artery disease    Dr. Martinique Past Surgical History: Past Surgical History Procedure Laterality Date . Angioplasty  1993   LAD . Cardiac catheterization   . Coronary angioplasty   . Basal cell cancer     head . Hernia repair   . Appendectomy   . Patent foramen ovale closure  2008 . Eye surgery   . Colonoscopy N/A 06/18/2012   Procedure: COLONOSCOPY;  Surgeon: Jeryl Columbia, MD;  Location: [facility] ENDOSCOPY;  Service: Endoscopy;  Laterality: N/A;  h&p in file-[place]  . Hot hemostasis N/A 06/18/2012   Procedure: HOT HEMOSTASIS (ARGON PLASMA COAGULATION/BICAP);  Surgeon: Altamese Dilling  Drema Balzarine, MD;  Location: [facility] ENDOSCOPY;  Service: Endoscopy;  Laterality: N/A; . Colonoscopy with propofol N/A 03/07/2013   Procedure: COLONOSCOPY WITH PROPOFOL;  Surgeon: Jeryl Columbia, MD;  Location: [facility] ENDOSCOPY;  Service: Endoscopy;  Laterality: N/A; HPI: 75 y.o. male with PMH as outlined below including uncontrolled hypertension for which he has had multiple admissions. He was brought to [facility] ED 04/19 evening by his wife who informed staff that he was having a stroke due to the fact that he was unable to follow commands and had gargled speech. He has hx of hemorrhagic stroke  and apparently presented very similarly at the time. He was last seen normal around 1pm earlier that afternoon. Wife stated that he never had any focal deficits, only gargled speech and inability follow commands.  Subjective: pt alert, pleasant, HOH, says he coughs all the time when eating and drinking Assessment / Plan / Recommendation CHL IP CLINICAL IMPRESSIONS 07/12/2015 Therapy Diagnosis Mild oral phase dysphagia;Severe pharyngeal phase dysphagia Clinical Impression Pt has a mild oral and severe pharyngeal dysphagia, which appears only mildly exacerbated from baseline swallowing function when compared to prior MBS studies. Oral phase is discoordinated with poor bolus cohesion and prolonged transit time, but ultimately with good clearance. He has a delay in swallow trigger with all consistencies reaching the pyriform sinuses, which results in aspiration of honey thick liquids and purees. Aspiration is intermittently sensed, with delayed and inconsistent coughing not entirely effective at clearing aspirates. With Max cues for a chin tuck, he is able to protect his airway with purees and Dys 2 textures. Mild residue remains in the valleculae, although it is not significantly changed with more solid boluses. Recommend Dys 2 diet and pudding thick liquids with full supervision for use of chin tuck. Impact on safety and function Moderate aspiration risk;Severe aspiration risk   CHL IP TREATMENT RECOMMENDATION 07/12/2015 Treatment Recommendations Therapy as outlined in treatment plan below   Prognosis 07/12/2015 Prognosis for Safe Diet Advancement Good Barriers to Reach Goals Cognitive deficits;Severity of deficits;Time post onset Barriers/Prognosis Comment -- CHL IP DIET RECOMMENDATION 07/12/2015 SLP Diet Recommendations Dysphagia 2 (Fine chop) solids;Pudding thick liquid Liquid Administration via Spoon Medication Administration Crushed with puree Compensations Minimize environmental distractions;Slow rate;Small  sips/bites;Chin tuck Postural Changes Remain semi-upright after after feeds/meals (Comment);Seated upright at 90 degrees   CHL IP OTHER RECOMMENDATIONS 07/12/2015 Recommended Consults -- Oral Care Recommendations Oral care BID Other Recommendations Order thickener from pharmacy;Prohibited food (jello, ice cream, thin soups);Remove water pitcher   CHL IP FOLLOW UP RECOMMENDATIONS 07/12/2015 Follow up Recommendations (No Data)   CHL IP FREQUENCY AND DURATION 07/12/2015 Speech Therapy Frequency (ACUTE ONLY) min 2x/week Treatment Duration 2 weeks      CHL IP ORAL PHASE 07/12/2015 Oral Phase Impaired Oral - Pudding Teaspoon -- Oral - Pudding Cup -- Oral - Honey Teaspoon Decreased bolus cohesion;Delayed oral transit Oral - Honey Cup -- Oral - Nectar Teaspoon -- Oral - Nectar Cup -- Oral - Nectar Straw -- Oral - Thin Teaspoon -- Oral - Thin Cup -- Oral - Thin Straw -- Oral - Puree Decreased bolus cohesion;Delayed oral transit Oral - Mech Soft Decreased bolus cohesion;Delayed oral transit Oral - Regular -- Oral - Multi-Consistency -- Oral - Pill -- Oral Phase - Comment --  CHL IP PHARYNGEAL PHASE 07/12/2015 Pharyngeal Phase Impaired Pharyngeal- Pudding Teaspoon -- Pharyngeal -- Pharyngeal- Pudding Cup -- Pharyngeal -- Pharyngeal- Honey Teaspoon Delayed swallow initiation-pyriform sinuses;Reduced anterior laryngeal mobility;Reduced laryngeal elevation;Reduced tongue base retraction;Penetration/Aspiration before swallow;Pharyngeal residue -  valleculae;Compensatory strategies attempted (with notebox) Pharyngeal Material enters airway, passes BELOW cords without attempt by patient to eject out (silent aspiration) Pharyngeal- Honey Cup -- Pharyngeal -- Pharyngeal- Nectar Teaspoon -- Pharyngeal -- Pharyngeal- Nectar Cup -- Pharyngeal -- Pharyngeal- Nectar Straw -- Pharyngeal -- Pharyngeal- Thin Teaspoon -- Pharyngeal -- Pharyngeal- Thin Cup -- Pharyngeal -- Pharyngeal- Thin Straw -- Pharyngeal -- Pharyngeal- Puree Delayed swallow  initiation-pyriform sinuses;Reduced anterior laryngeal mobility;Reduced laryngeal elevation;Reduced tongue base retraction;Penetration/Aspiration before swallow;Pharyngeal residue - valleculae;Compensatory strategies attempted (with notebox) Pharyngeal Material enters airway, passes BELOW cords without attempt by patient to eject out (silent aspiration) Pharyngeal- Mechanical Soft Delayed swallow initiation-pyriform sinuses;Reduced anterior laryngeal mobility;Reduced laryngeal elevation;Reduced tongue base retraction;Pharyngeal residue - valleculae;Compensatory strategies attempted (with notebox) Pharyngeal Material does not enter airway Pharyngeal- Regular -- Pharyngeal -- Pharyngeal- Multi-consistency -- Pharyngeal -- Pharyngeal- Pill -- Pharyngeal -- Pharyngeal Comment --  CHL IP CERVICAL ESOPHAGEAL PHASE 07/12/2015 Cervical Esophageal Phase Impaired Pudding Teaspoon -- Pudding Cup -- Honey Teaspoon Esophageal backflow into cervical esophagus Honey Cup -- Nectar Teaspoon -- Nectar Cup -- Nectar Straw -- Thin Teaspoon -- Thin Cup -- Thin Straw -- Puree Esophageal backflow into cervical esophagus Mechanical Soft Esophageal backflow into cervical esophagus Regular -- Multi-consistency -- Pill -- Cervical Esophageal Comment -- No flowsheet data found. [name], M.A. CCC-SLP [phone] [name] 07/12/2015, 1:44 PM               Time Spent in minutes  25   Louellen Molder M.D on 07/20/2015 at 12:09 PM  Between 7am to 7pm - Pager - [phone]  After 7pm go to [email or web address] - password [facility]  [facility] -  Office  [phone]

## 2015-07-20 NOTE — Consult Note (Signed)
Consultation Note Date: 07/20/2015   Patient Name: Benjamin Park  DOB: 1941-01-17  MRN: [id number]  Age / Sex: 75 y.o., male  PCP: No primary care provider on file. Referring Physician: Louellen Molder, MD  Reason for Consultation: Establishing goals of care  HPI/Patient Profile: 75 y.o. male  with past medical history of  TIA, falls, sleep apnea admitted on 06/24/2015 with HTN encephalopathy and asp pna.   Clinical Assessment and Goals of Care:  75 year old male with history of uncontrolled hypertension, history of TIAs and stroke, frequent falls, sleep apnea, history of subarachnoid hemorrhage, MI with angioplasty, dysphagia, hypothyroidism, neuromuscular disorder, hyperlipidemia, lives at home with wife, was brought to the hospital on 4/19 after he had slurred speech and unable to follow commands. He had history of hemorrhagic stroke previously with similar presentation. In the ED he had elevated blood pressure in the low 200s and was started on a nicardipine drip but rate showed his blood pressure dropped into low 100s. Head CT was unremarkable for acute infarct or hemorrhage. Neurology evaluated the patient in the ED and while he was then he had acute change in mental status and became minimally responsive. Chest x-ray showed new left lower lobe infiltrate concerning for aspiration.  He was admitted to the ICU intubated. He was then transferred to medical floor on 4/21 after being successfully extubated. Started to improve and was planned for discharge on 4/25 but desaturated again with concern for repeat aspiration, became lethargic and blood pressure again elevated to 800L systolic. Patient was again intubated for airway protection and transferred to ICU. Patient was then found to have left-sided hemiparesis. MRI was done which  upon review by stroke team suggested to have bilateral frontal MCA, ACA infarct  of embolic pattern. EEG done showed triphasic wave without seizure activity. Patient was extubated on 4/29. Patient continues to have left-sided weakness and failed swallow. Transferred to hospitalist service on 5/1.   Remains in the hospital, has NGT, tube feeds. Palliative consulted for goals of care discussions.    Met with the patient, his wife of 37 years, his 2 close friends in the room this afternoon.  I introduced palliative care as follows: Palliative medicine is specialized medical care for people living with serious illness. It focuses on providing relief from the symptoms and stress of a serious illness. The goal is to improve quality of life for both the patient and the family.  Wife states that the patient would want to have a PEG tube placed, she would want to consider rehab and a time trial of current therapies. Patient does not verbalize much but agrees.   Comfort based approach to care, including comfort feeds, no PEG tube, etc discussed in detail. Patient and wife would not want to consider comfort measures at this time.   Patient's wife states that she is well aware of his co morbidities, she is aware that he may have ongoing aspiration that a PEG tube would not fix. She wishes to undergo PEG tube placement, ' let's  give the guy a chance, I know him, he has gotten better before. He has more lives than a cat." Agreed with her hopes for improvement/ stabilization in the patient's overall condition. Active listening and supportive care provided. All questions answered to the best of my ability.   Thank you for the consult.  HCPOA  wife  SUMMARY OF RECOMMENDATIONS: Full code PEG tube if necessary Inpatient rehab or SNF rehab on D/C Full scope of life maintaining/prolonging/sustaining treatments for now.   Code Status/Advance Care Planning:  Full code    Symptom Management:  Agree with aggressive HTN management.   Palliative Prophylaxis:   Bowel  Regimen  Additional Recommendations (Limitations, Scope, Preferences):  Full Scope Treatment  Psycho-social/Spiritual:   Desire for further Chaplaincy support:no  Additional Recommendations: Caregiving  Support/Resources  Prognosis:   < 6 months ? Discussed with wife regarding the patient's recurrent aspiration events, high risk for ongoing decline.   Discharge Planning: [place] for rehab with recommendations for Palliative care service follow-up     Primary Diagnoses: Present on Admission:  . Encephalopathy acute . Hypertensive emergency . Hypokalemia . Aspiration pneumonia ([place]) . Sleep apnea . Vomiting . Abnormal involuntary movement . Hypertensive urgency, malignant . Acute encephalopathy  I have reviewed the medical record, interviewed the patient and family, and examined the patient. The following aspects are pertinent.  Past Medical History  Diagnosis Date  . Hx of transient ischemic attack (TIA)   . Hyperlipidemia   . ED (erectile dysfunction)   . Personal history of kidney stones   . Subarachnoid hemorrhage ([place])     '11- hospitalized -3 to 4 days(passed out-awoke in hospital)- no residual affects.  . Myocardial infarction ([place])     12/1991  . Dysphagia   . Hypokalemia   . Hypothyroidism   . Sleep apnea     on CPAP-settings 6  . History of kidney stones 02-27-13    x5- none recent  . Throat cancer [facility])     '00-radiation therapy  . Stroke ([place])   . Unspecified cerebral artery occlusion with cerebral infarction 11/08/2012  . Episodic confusion 02-27-13    hx. of, not currently(multiple episodes- ? TIA's)-always saw MD to be evaluated.  Marland Kitchen AKI (acute kidney injury) ([place]) 02/23/2011    pt. denies any problems 02-27-13  . Hypertension     hx. labile - with spikes every 3-6 mos.-last 8'14 ED visit.  Marland Kitchen Hypertensive encephalopathy   . Headache(784.0)     episodic with periods of confusion -multiple  . Neuromuscular disorder ([place])      "parkinson's" -no tremor-sees neurology [facility]  . Coronary artery disease     Dr. [place]   Social History   Social History  . Marital Status: Married    Spouse Name: N/A  . Number of Children: 4  . Years of Education: N/A   Occupational History  . Art gallery manager    Social History Main Topics  . Smoking status: Former Smoker    Types: Cigarettes    Quit date: 09/21/1990  . Smokeless tobacco: Never Used  . Alcohol Use: No  . Drug Use: No  . Sexual Activity: Not Currently   Other Topics Concern  . None   Social History Narrative   Family History  Problem Relation Age of Onset  . Heart failure Father   . Heart attack Brother   . Anemia Brother 59    mi   Scheduled Meds: . antiseptic oral rinse  7 mL  Mouth Rinse q12n4p  . aspirin  325 mg Per Tube Daily  . chlorhexidine  15 mL Mouth Rinse BID  . feeding supplement (JEVITY 1.2 CAL)  1,000 mL Per Tube Q24H  . heparin  5,000 Units Subcutaneous Q8H  . hydrALAZINE  25 mg Oral Q8H  . insulin aspart  0-9 Units Subcutaneous Q4H  . ketorolac  15 mg Intravenous Once  . labetalol  100 mg Per Tube BID  . levothyroxine  112 mcg Per Tube QAC breakfast  . pantoprazole (PROTONIX) IV  40 mg Intravenous Q24H  . pravastatin  20 mg Per Tube q1800   Continuous Infusions: . dextrose 50 mL/hr at 07/20/15 0820   PRN Meds:.sodium chloride, fentaNYL (SUBLIMAZE) injection, hydrALAZINE Medications Prior to Admission:  Prior to Admission medications   Medication Sig Start Date End Date Taking? Authorizing Provider  Ascorbic Acid (VITAMIN C PO) Take 1 tablet by mouth daily.   Yes Historical Provider, MD  aspirin 325 MG tablet Take 325 mg by mouth daily.   Yes Historical Provider, MD  chlorthalidone (HYGROTON) 25 MG tablet Take 25 mg by mouth daily.   Yes Historical Provider, MD  cloNIDine (CATAPRES) 0.1 MG tablet Take 0.1 mg by mouth as needed (When BP >160/95).   Yes Historical Provider, MD  ENSURE (ENSURE) Take 237 mLs by mouth daily.   Yes  Historical Provider, MD  fish oil-omega-3 fatty acids 1000 MG capsule Take 1 g by mouth daily.    Yes Historical Provider, MD  GLUCOSAMINE HCL PO Take 1 tablet by mouth daily. Reported on 04/15/2015   Yes Historical Provider, MD  KLOR-CON M10 10 MEQ tablet Take 10 mEq by mouth daily. 05/12/14  Yes Historical Provider, MD  labetalol (NORMODYNE) 100 MG tablet Take 1 tablet (100 mg total) by mouth 2 (two) times daily. 04/16/15  Yes Barton Dubois, MD  levothyroxine (SYNTHROID, LEVOTHROID) 112 MCG tablet Take 112 mcg by mouth daily.  12/11/13  Yes Historical Provider, MD  lovastatin (MEVACOR) 20 MG tablet Take 20 mg by mouth 2 (two) times daily.   Yes Historical Provider, MD  Maltodextrin-Xanthan Gum (RESOURCE THICKENUP CLEAR) POWD Take 1 g by mouth as needed. 02/03/14  Yes Marianne L York, PA-C  Multiple Vitamin (MULITIVITAMIN WITH MINERALS) TABS Take 1 tablet by mouth daily.     Yes Historical Provider, MD   No Known Allergies Review of Systems Doesn't verbalize much  Physical Exam Awake alert Has NGT in Is able to verbalize some S1 S2 Clear shallow anterior No edema Weakness R side, RUE more than RLE  Vital Signs: BP 118/78 mmHg  Pulse 81  Temp(Src) 98.6 F (37 C) (Axillary)  Resp 20  Ht '5\' 7"'$  (1.702 m)  Wt 69.219 kg (152 lb 9.6 oz)  BMI 23.89 kg/m2  SpO2 94% Pain Assessment: No/denies pain POSS *See Group Information*: S-Acceptable,Sleep, easy to arouse Pain Score: 0-No pain   SpO2: SpO2: 94 % O2 Device:SpO2: 94 % O2 Flow Rate: .O2 Flow Rate (L/min): 2 L/min  IO: Intake/output summary:  Intake/Output Summary (Last 24 hours) at 07/20/15 1445 Last data filed at 07/20/15 0657  Gross per 24 hour  Intake     70 ml  Output   1650 ml  Net  -1580 ml    LBM: Last BM Date: 07/19/15 Baseline Weight: Weight: 68.5 kg (151 lb 0.2 oz) Most recent weight: Weight: 69.219 kg (152 lb 9.6 oz)     Palliative Assessment/Data:   Flowsheet Rows  Most Recent Value   Intake Tab     Referral Department  Hospitalist   Unit at Time of Referral  Med/Surg Unit   Palliative Care Primary Diagnosis  Neurology   Date Notified  07/19/15   Palliative Care Type  New Palliative care   Reason for referral  Clarify Goals of Care   Date of Admission  07/12/2015   Date first seen by Palliative Care  07/20/15   # of days IP prior to Palliative referral  12   Clinical Assessment    Palliative Performance Scale Score  30%   Pain Max last 24 hours  3   Pain Min Last 24 hours  2   Dyspnea Max Last 24 Hours  3   Dyspnea Min Last 24 hours  2   Psychosocial & Spiritual Assessment    Palliative Care Outcomes    Patient/Family meeting held?  Yes   Who was at the meeting?  wife Benjamin Park and patient.    Palliative Care Outcomes  Clarified goals of care   Palliative Care follow-up planned  Yes, Facility      Time In: 1:45 pm  Time Out: 2:45 pm  Time Total: 60 minutes  Greater than 50%  of this time was spent counseling and coordinating care related to the above assessment and plan.  Signed by: Loistine Chance, MD  [phone] Please contact Palliative Medicine Team phone at [phone] for questions and concerns.  For individual provider: See Shea Evans

## 2015-07-20 NOTE — Progress Notes (Addendum)
Speech Language Pathology Treatment: Dysphagia  Patient Details Name: Benjamin Park MRN: [id number] DOB: 14-Jun-1940 Today's Date: 07/20/2015 Time: [id number] SLP Time Calculation (min) (ACUTE ONLY): 24 min  Assessment / Plan / Recommendation Clinical Impression  Pt seen for dysphagia followup. The patient was alert and required extensive oral care due to dried secretions in the posterior oral cavity spanning from the soft palate to the base of tongue. Pt presented with ice chips and required intermittent verbal cueing to initiate oral manipulation. Consistent pharyngeal response present with immediate cough noted after swallowing each ice chip. Ongoing education re: importance of oral care as it relates to aspiration. Recommend: Continue with NPO status. Per pt's spouse, he will have PEG placed in the near future.    HPI HPI: 75 y.o. male with PMH as outlined below including uncontrolled hypertension, Parkinsons' disease.  He was brought to [facility] ED 04/19 evening by his wife who informed staff that he was having a stroke due to the fact that he was unable to follow commands and had gargled speech. He has hx of hemorrhagic stroke.Pt has hx of throat cancer with radiation from 2000; he has long hx of dysphagia with repeated MBS studies and frequent aspiration.   Last study completed showed mild oral and severe pharyngeal phase dysphagia.  Diet recommended was dys2/pudding.  Pt required intubation,  CXR showed LLL infiltrate likely related to aspiration.        SLP Plan  Continue with current plan of care     Recommendations  Diet recommendations: NPO Medication Administration: Via alternative means             Oral Care Recommendations: Oral care BID;Staff/trained caregiver to provide oral care Follow up Recommendations:  (TBD? CIR vs SNF) Plan: Continue with current plan of care     [place], [place] Pager [phone] 07/20/2015, 3:36 PM

## 2015-07-20 NOTE — Care Management Important Message (Signed)
Important Message  Patient Details  Name: Benjamin Park MRN: [id number] Date of Birth: 31-Mar-1940   Medicare Important Message Given:  Yes    [name] 07/20/2015, 1:30 PM

## 2015-07-21 ENCOUNTER — Inpatient Hospital Stay (HOSPITAL_COMMUNITY): Payer: Medicare HMO

## 2015-07-21 LAB — GLUCOSE, CAPILLARY
GLUCOSE-CAPILLARY: 111 mg/dL — AB (ref 65–99)
GLUCOSE-CAPILLARY: 112 mg/dL — AB (ref 65–99)
GLUCOSE-CAPILLARY: 117 mg/dL — AB (ref 65–99)
GLUCOSE-CAPILLARY: 120 mg/dL — AB (ref 65–99)
GLUCOSE-CAPILLARY: 131 mg/dL — AB (ref 65–99)
Glucose-Capillary: 108 mg/dL — ABNORMAL HIGH (ref 65–99)
Glucose-Capillary: 109 mg/dL — ABNORMAL HIGH (ref 65–99)
Glucose-Capillary: 128 mg/dL — ABNORMAL HIGH (ref 65–99)

## 2015-07-21 LAB — BLOOD GAS, ARTERIAL
Acid-Base Excess: 2.9 mmol/L — ABNORMAL HIGH (ref 0.0–2.0)
BICARBONATE: 25.9 meq/L — AB (ref 20.0–24.0)
Drawn by: 280981
FIO2: 1
O2 SAT: 98.3 %
PATIENT TEMPERATURE: 98.6
PO2 ART: 108 mmHg — AB (ref 80.0–100.0)
TCO2: 26.9 mmol/L (ref 0–100)
pCO2 arterial: 32.5 mmHg — ABNORMAL LOW (ref 35.0–45.0)
pH, Arterial: 7.513 — ABNORMAL HIGH (ref 7.350–7.450)

## 2015-07-21 LAB — BASIC METABOLIC PANEL
Anion gap: 11 (ref 5–15)
BUN: 29 mg/dL — AB (ref 6–20)
CO2: 24 mmol/L (ref 22–32)
Calcium: 9.1 mg/dL (ref 8.9–10.3)
Chloride: 112 mmol/L — ABNORMAL HIGH (ref 101–111)
Creatinine, Ser: 1.07 mg/dL (ref 0.61–1.24)
GFR calc Af Amer: >60 mL/min (ref >60)
GLUCOSE: 134 mg/dL — AB (ref 65–99)
POTASSIUM: 3.2 mmol/L — AB (ref 3.5–5.1)
Sodium: 147 mmol/L — ABNORMAL HIGH (ref 135–145)

## 2015-07-21 LAB — CBC
HCT: 48 % (ref 39.0–52.0)
Hemoglobin: 15.6 g/dL (ref 13.0–17.0)
MCH: 32 pg (ref 26.0–34.0)
MCHC: 32.5 g/dL (ref 30.0–36.0)
MCV: 98.4 fL (ref 78.0–100.0)
PLATELETS: 292 10*3/uL (ref 150–400)
RBC: 4.88 MIL/uL (ref 4.22–5.81)
RDW: 13.5 % (ref 11.5–15.5)
WBC: 19 10*3/uL — AB (ref 4.0–10.5)

## 2015-07-21 LAB — TROPONIN I
Troponin I: <0.03 ng/mL (ref <0.031)
Troponin I: 0.05 ng/mL — ABNORMAL HIGH (ref <0.031)
Troponin I: 0.05 ng/mL — ABNORMAL HIGH (ref <0.031)

## 2015-07-21 MED ORDER — AMPICILLIN-SULBACTAM SODIUM 3 (2-1) G IJ SOLR
3.0000 g | Freq: Four times a day (QID) | INTRAMUSCULAR | Status: DC
  Administered 2015-07-21 – 2015-07-24 (×12): 3 g via INTRAVENOUS
  Filled 2015-07-21 (×15): qty 3

## 2015-07-21 MED ORDER — HYDRALAZINE HCL 20 MG/ML IJ SOLN
20.0000 mg | Freq: Four times a day (QID) | INTRAMUSCULAR | Status: DC | PRN
  Administered 2015-07-23: 20 mg via INTRAVENOUS
  Filled 2015-07-21: qty 1

## 2015-07-21 MED ORDER — SODIUM CHLORIDE 0.9 % IV BOLUS (SEPSIS)
500.0000 mL | Freq: Once | INTRAVENOUS | Status: AC
  Administered 2015-07-21: 500 mL via INTRAVENOUS

## 2015-07-21 MED ORDER — FUROSEMIDE 10 MG/ML IJ SOLN
40.0000 mg | Freq: Once | INTRAMUSCULAR | Status: AC
  Administered 2015-07-21: 40 mg via INTRAVENOUS
  Filled 2015-07-21: qty 4

## 2015-07-21 MED ORDER — METOPROLOL TARTRATE 5 MG/5ML IV SOLN
5.0000 mg | Freq: Once | INTRAVENOUS | Status: AC
  Administered 2015-07-21: 5 mg via INTRAVENOUS
  Filled 2015-07-21: qty 5

## 2015-07-21 MED ORDER — POTASSIUM CHLORIDE 10 MEQ/100ML IV SOLN
10.0000 meq | INTRAVENOUS | Status: AC
  Administered 2015-07-21 (×3): 10 meq via INTRAVENOUS
  Filled 2015-07-21 (×2): qty 100

## 2015-07-21 MED ORDER — METOPROLOL TARTRATE 5 MG/5ML IV SOLN
5.0000 mg | Freq: Four times a day (QID) | INTRAVENOUS | Status: DC
  Administered 2015-07-21 – 2015-07-24 (×9): 5 mg via INTRAVENOUS
  Filled 2015-07-21 (×10): qty 5

## 2015-07-21 NOTE — Progress Notes (Signed)
Pharmacy Antibiotic Note  LIBORIO CRAYCRAFT is a 75 y.o. male admitted on 07/08/2015 with pneumonia.  Pharmacy has been consulted for Unasyn dosing.  Plan: Unasyn 3 grams iv Q 6 hours Pharmacy to sign off and follow peripherally for changes in renal function  Height: 5\' 7"  (170.2 cm) Weight: 151 lb 11.2 oz (68.811 kg) IBW/kg (Calculated) : 66.1  Temp (24hrs), Avg:98.8 F (37.1 C), Min:97.8 F (36.6 C), Max:100.2 F (37.9 C)   Recent Labs Lab 07/15/15 0758 07/16/15 0254 07/17/15 0104 07/18/15 0333 07/19/15 0407 07/20/15 0617 07/21/15 0949  WBC 8.7 5.6  --  4.5 6.6  --  19.0*  CREATININE 0.94 0.97 0.84 1.07 1.13 1.12 1.07    Estimated Creatinine Clearance: 56.6 mL/min (by C-G formula based on Cr of 1.07).    No Known Allergies   Thank you for allowing pharmacy to be a part of this patient's care.  Tad Moore 07/21/2015 11:13 AM

## 2015-07-21 NOTE — Progress Notes (Signed)
Patient began to have labored breathing, respirations 27, shallow, breathing through mouth, SaO2 96% on 4L humidified [place]. NGT shows possible reflux. Auscultation at 6:15 verified correct placement. Feedings stopped. MD was notified, Rapid response paged. Will pass along to day shift RN. Family notified.

## 2015-07-21 NOTE — Significant Event (Signed)
Rapid Response Event Note  Overview: Time Called: 0830 Arrival Time: [id number] Event Type: Respiratory  Initial Focused Assessment: Patient with labored breathing and decreased LOC, responds with a nod to questions. He is very weak, moving spontaneously , but not following commands. Lung sounds decreased bases. Regular heart tones BP 164/117  ST 130s RR 28  O2 sats 94% on 5l [place]  Wife at bedside  Interventions: He has received Hydralazine and Lasix and PCXR prior to my arrival. Oral care done, Patient with thick secretions.  When he coughs his secretions pocket in the back of his throat. While turning patient large amount of thick secretions suctions from the back of his throat. He became more mottled as well. Placed on 100% NRB O2 sats 98% BP 122/85 ST 148  RR 32  O2 sats 98% on 100% NRB Dr Clementeen Graham notified of patient status: orders received for labs 10:35  ABG  7.51/32/108/25 1130 patient continues to have rapid labored breathing, but he is a little more alert.  Still not verbally interactive. 13mcg Fentanly given IV,   NTS, unable to enter lungs, but suctioned out thick beige secretions from the back of his throat. Dr Clementeen Graham at bedside,  Orders received to transfer to SDU    Event Summary: Name of Physician Notified: Dhungel at bedside prior to my arrival at  (pta RRT)    Outcome: Transferred (Comment)  Event End Time: Vian  Raliegh Ip

## 2015-07-21 NOTE — Progress Notes (Signed)
Pt not appropriate for inpt rehab level of intensity of rehab. Noted plans as outlined by Dr. Clementeen Graham I will sign off. [phone]

## 2015-07-21 NOTE — Progress Notes (Signed)
PROGRESS NOTE                                                                                                                                                                                                             Patient Demographics:    Benjamin Park, is a 75 y.o. male, DOB - 01-06-1941, DW:[id number]  Admit date - 07/16/2015   Admitting Physician Rush Farmer, MD  Outpatient Primary MD for the patient is No primary care provider on file.  LOS - 14  Outpatient Specialists:   No chief complaint on file.      Brief Narrative   75 year old male with history of uncontrolled hypertension, history of TIAs and stroke, frequent falls, sleep apnea, history of subarachnoid hemorrhage, MI with angioplasty, dysphagia, hypothyroidism, neuromuscular disorder, hyperlipidemia was brought to the hospital on 4/19 after he had slurred speech and unable to follow commands. He had history of hemorrhagic stroke previously with similar presentation. In the ED he had elevated blood pressure in the low 200s and was started on a new nicardipine drip but rate showed his blood pressure dropped into low 100s. Head CT was unremarkable for acute infarct or hemorrhage. Neurology evaluated the patient in the ED and while he was then he had acute change in mental status and became minimally responsive. Chest x-ray showed new left lower lobe infiltrate concerning for aspiration.  he was admitted to the ICU intubated. He was then transferred to medical floor on 4/21 after being successfully extubated. Started to improve and was planned for discharge on 4/25 but desaturated again with concern for repeat aspiration, became lethargic and blood pressure again elevated to [id number] systolic. Patient was again intubated for airway protection and transferred to ICU. Patient was then found to have left-sided hemiparesis. MRI was done which  upon review by stroke team  suggested to have bilateral frontal MCA, ACA infarct of embolic pattern. EEG done showed triphasic wave without seizure activity. Patient was extubated on 4/29. Patient continues to have left-sided weakness and failed swallow. Transferred to hospitalist service on 5/1.  On 5/3 patient went into respiratory distress and hypertensive. Chest x-ray showed atelectasis. Possibly had a mucous plug and aspiration. Required NRB and Transfer to stepdown unit.     Subjective:   Patient went into respiratory distress. Communicating very poorly. RRT called. Patient placed on  NRB, given a dose of IV Lasix followed by IV Lopressor. Suctioning done with significant amount of mucus retrieved.  Transferred to stepdown unit.   Assessment  & Plan :   Principal problem Acute hypoxic respiratory failure Likely due to aspiration pneumonia. Required Intubation twice during hospital course. Completed antibiotic course on 4/28.Given recurrent aspiration on 5/3 placed him on Unasyn.  failed swallow evaluation recurrently.Started him on NG feeds on 5/2, held due to aspiration and respiratory distress. -Patient went into respiratory distress this morning. Likely due to aspiration and mucous plug. Needs aggressive suctioning. -Discussed treatment plan with wife. She wants aggressive care for at least 24 hours before deciding on nonaggressive measures.    Active Problems: Acute encephalopathy Likely a combination of aspiration pneumonia, malignant hypertension and CVA -Patient showed some improvement on 5/2 but deteriorated again. -Needs Aggressive blood pressure control. It appears that his mental status deteriorates every time his blood pressure increases. MRI suggests pt might have PRES but neurology thinks he has stroke. Was placed on scheduled labetalol and hydralazine but since he strict nothing by mouth we will monitor on when necessary IV hydralazine only.  Acute bilateral frontal embolic stroke Source  unclear. 2-D echo with normal EF and no cardiac source of emboli. Lower extremity Doppler negative for DVT. Continue full dose aspirin and statin. Neurology recommends outpatient 30 day Holter monitoring upon discharge.  Uncontrolled hypertension with hypertensive emergency Hold off by mouth medications given strict nothing by mouth. Place on scheduled metoprolol and when necessary hydralazine.  Hypothyroidism On Synthroid. (Held today)  Severe dysphagia Continue nothing by mouth. Started NG tube feeding on 5/2. Held for possible aspiration  Hypernatremia Improved with D5.  Hypokalemia Replenish with IV KCl.   Goals of care Overall prognosis seems poor.  Palliative care consulted regarding goals of care discussion, tube feeds etc. had prolonged discussion with wife on 5/2. She wanted aggressive care including life support and PEG tube feeding. Given his worsening symptoms today I discussed with her again. She wants to continue aggressive care and monitor for at least 24-48 hours and if unimproved would agree up on comfort measures.  Code Status : Full code  Family Communication  : Wife at bedside  Disposition Plan  :  PT recommends CIR, but given his acute symptoms he is not eligible to participate.  Barriers For Discharge :  Active symptoms  Consults  :   PC CM Neurology Palliative care     DVT Prophylaxis  :  SQ heparin  Lab Results  Component Value Date   PLT 292 07/21/2015    CULTURES: Blood 04/20 > NG final Sputum 04/20 > Klebsiella pneumoniae, Pseudomonas (sensitivities reported)  ANTIBIOTICS: Zosyn 04/20 >>>4/28 Vanc 4/20>>>4/22, restarted 4/25>>4/27 Unasyn 5/3-   LINES/TUBES: ETT 04/20 >4/21, 4/25-29  Procedures Head CT MRI brain 2-D echo Doppler lower extremities EEG   Anti-infectives    Start     Dose/Rate Route Frequency Ordered Stop   07/21/15 1200  Ampicillin-Sulbactam (UNASYN) 3 g in sodium chloride 0.9 % 100 mL IVPB     3 g 100  mL/hr over 60 Minutes Intravenous Every 6 hours 07/21/15 1113     07/13/15 1800  vancomycin (VANCOCIN) IVPB 1000 mg/200 mL premix  Status:  Discontinued     1,000 mg 200 mL/hr over 60 Minutes Intravenous Every 12 hours 07/13/15 1757 07/15/15 1040   07/08/15 1800  vancomycin (VANCOCIN) IVPB 750 mg/150 ml premix  Status:  Discontinued     750 mg 150  mL/hr over 60 Minutes Intravenous Every 12 hours 07/08/15 0050 07/09/15 0942   07/08/15 0800  piperacillin-tazobactam (ZOSYN) IVPB 3.375 g     3.375 g 12.5 mL/hr over 240 Minutes Intravenous Every 8 hours 07/08/15 0050 07/15/15 2359   07/08/15 0100  vancomycin (VANCOCIN) IVPB 1000 mg/200 mL premix     1,000 mg 200 mL/hr over 60 Minutes Intravenous  Once 07/08/15 0050 07/08/15 0809   07/08/15 0100  piperacillin-tazobactam (ZOSYN) IVPB 3.375 g  Status:  Discontinued     3.375 g 100 mL/hr over 30 Minutes Intravenous  Once 07/08/15 0050 07/16/15 0750   07/08/15 0000  metroNIDAZOLE (FLAGYL) IVPB 500 mg     500 mg 100 mL/hr over 60 Minutes Intravenous  Once 07/15/2015 2359 07/08/15 1900   07/06/2015 2015  cefTRIAXone (ROCEPHIN) 1 g in dextrose 5 % 50 mL IVPB     1 g 100 mL/hr over 30 Minutes Intravenous  Once 07/06/2015 2012 06/26/2015 2152   07/13/2015 2015  azithromycin (ZITHROMAX) 500 mg in dextrose 5 % 250 mL IVPB     500 mg 250 mL/hr over 60 Minutes Intravenous  Once 06/26/2015 2012 06/20/2015 2318        Objective:   Filed Vitals:   07/21/15 1002 07/21/15 1209 07/21/15 1332 07/21/15 1400  BP: 136/97 108/76 95/76 109/88  Pulse: 109 118  110  Temp:    98.9 F (37.2 C)  TempSrc:    Oral  Resp: 32   24  Height:      Weight:      SpO2: 100% 97%  98%    Wt Readings from Last 3 Encounters:  07/21/15 68.811 kg (151 lb 11.2 oz)  04/15/15 68.539 kg (151 lb 1.6 oz)  03/13/15 75.6 kg (166 lb 10.7 oz)     Intake/Output Summary (Last 24 hours) at 07/21/15 1413 Last data filed at 07/20/15 1700  Gross per 24 hour  Intake      0 ml  Output    400 ml    Net   -400 ml     Physical Exam  Gen: Patient in respiratory distress, noncommunicative HEENT: NG in place,  dry mucosa, supple neck Chest: Diminished breath sounds bilaterally CVS: S1 and S2 tachycardic, no murmurs rub or gallop GI: soft, NT, ND, BS+ Musculoskeletal: warm, no edema CNS: Awake but non-verbal, left hemiparesis    Data Review:    CBC  Recent Labs Lab 07/15/15 0758 07/16/15 0254 07/18/15 0333 07/19/15 0407 07/21/15 0949  WBC 8.7 5.6 4.5 6.6 19.0*  HGB 13.1 12.4* 14.1 14.6 15.6  HCT 39.6 38.0* 43.7 44.8 48.0  PLT 195 223 247 265 292  MCV 96.4 98.2 99.5 99.1 98.4  MCH 31.9 32.0 32.1 32.3 32.0  MCHC 33.1 32.6 32.3 32.6 32.5  RDW 13.2 13.3 13.3 13.4 13.5    Chemistries   Recent Labs Lab 07/17/15 0104 07/18/15 0333 07/19/15 0407 07/20/15 0617 07/21/15 0949  NA 145 144 148* 148* 147*  K 3.5 3.4* 3.5 3.7 3.2*  CL 108 109 110 112* 112*  CO2 26 26 26 28 24   GLUCOSE 91 102* 112* 96 134*  BUN 18 32* 40* 41* 29*  CREATININE 0.84 1.07 1.13 1.12 1.07  CALCIUM 8.8* 8.9 9.1 8.9 9.1   ------------------------------------------------------------------------------------------------------------------ No results for input(s): CHOL, HDL, LDLCALC, TRIG, CHOLHDL, LDLDIRECT in the last 72 hours.  Lab Results  Component Value Date   HGBA1C 5.6 07/16/2015   ------------------------------------------------------------------------------------------------------------------ No results for input(s): TSH, T4TOTAL, T3FREE, THYROIDAB in  the last 72 hours.  Invalid input(s): FREET3 ------------------------------------------------------------------------------------------------------------------ No results for input(s): VITAMINB12, FOLATE, FERRITIN, TIBC, IRON, RETICCTPCT in the last 72 hours.  Coagulation profile No results for input(s): INR, PROTIME in the last 168 hours.  No results for input(s): DDIMER in the last 72 hours.  Cardiac Enzymes  Recent Labs Lab  07/15/15 0758 07/15/15 1128 07/21/15 0949  TROPONINI 0.03 <0.03 <0.03   ------------------------------------------------------------------------------------------------------------------ No results found for: BNP  Inpatient Medications  Scheduled Meds: . ampicillin-sulbactam (UNASYN) IV  3 g Intravenous Q6H  . antiseptic oral rinse  7 mL Mouth Rinse q12n4p  . aspirin  325 mg Per Tube Daily  . chlorhexidine  15 mL Mouth Rinse BID  . feeding supplement (JEVITY 1.2 CAL)  1,000 mL Per Tube Q24H  . heparin  5,000 Units Subcutaneous Q8H  . hydrALAZINE  25 mg Oral Q8H  . insulin aspart  0-9 Units Subcutaneous Q4H  . ketorolac  15 mg Intravenous Once  . labetalol  100 mg Per Tube BID  . levothyroxine  112 mcg Per Tube QAC breakfast  . pantoprazole (PROTONIX) IV  40 mg Intravenous Q24H  . pravastatin  20 mg Per Tube q1800   Continuous Infusions:   PRN Meds:.sodium chloride, fentaNYL (SUBLIMAZE) injection, hydrALAZINE  Micro Results No results found for this or any previous visit (from the past 240 hour(s)).  Radiology Reports Ct Angio Head W/cm &/or Wo Cm  07/15/2015  CLINICAL DATA:  75 year old male with widespread superior hemisphere restricted diffusion on brain MRI yesterday possibly related to emboli or severe posterior reversible encephalopathy syndrome. Initial encounter. EXAM: CT ANGIOGRAPHY HEAD AND NECK TECHNIQUE: Multidetector CT imaging of the head and neck was performed using the standard protocol during bolus administration of intravenous contrast. Multiplanar CT image reconstructions and MIPs were obtained to evaluate the vascular anatomy. Carotid stenosis measurements (when applicable) are obtained utilizing NASCET criteria, using the distal internal carotid diameter as the denominator. CONTRAST:  50 mL Isovue 370 COMPARISON:  Brain MRI 07/14/2015. Head CT 07/13/2015. Intracranial MRA 02/01/2014 cervical spine CT 04/09/2012. FINDINGS: CT HEAD Brain: Abnormal cortical  hypodensity in the bilateral superior frontal gyri, and to a lesser extent parietal lobes, corresponding to the abnormal diffusion. No associated hemorrhage or mass effect. Stable underlying cerebral volume. Stable gray-white matter differentiation elsewhere since 07/13/2015. No ventriculomegaly. Calvarium and skull base: Stable, negative. Paranasal sinuses: Stable left maxillary sinus opacification. Orbits: Stable, negative. CTA NECK Skeleton: Osteopenia. Exaggerated cervical lordosis with congenital appearing bilateral C6 lamina defects. Mild C6 compression vertebral compression. The appearance is unchanged from 2014. No acute osseous abnormality identified. Other neck: Intubated and oral enteric tube in place. Endotracheal tube tip terminates above the carina. Enteric tube courses within the thoracic esophagus. Moderate apical pulmonary scarring. Areas of centrilobular emphysema in the right upper lobe. No superior mediastinal lymphadenopathy. Diminutive or absent thyroid. Negative parapharyngeal and retropharyngeal spaces. Negative sublingual space. Atrophied or surgically absent submandibular glands. There is also degree of bilateral parotid atrophy. No cervical lymphadenopathy. Aortic arch: 3 vessel arch configuration. Moderate soft and calcified arch atherosclerosis. Right carotid system: No brachiocephalic artery stenosis despite soft plaque. No right CCA origin stenosis. Mildly tortuous right CCA. Proximal to the right carotid bifurcation there is medial soft plaque in the right CCA resulting an less than 50 % stenosis with respect to the distal vessel. This appears to be mildly ulcerated (series 501, image 70). At the right carotid bifurcation there is soft and calcified plaque but no proximal right ICA stenosis.  Soft and calcified plaque distal to the right ICA bulb without stenosis. The vessel remains patent to the skullbase. Left carotid system: No left CCA origin stenosis. Soft plaque proximal to the  left carotid bifurcation without stenosis. Soft and calcified plaque at the bifurcation without proximal left ICA stenosis. The left ICA is patent to the skullbase. Vertebral arteries: No proximal right subclavian artery stenosis despite soft and calcified plaque. No definite stenosis at the right vertebral artery origin. Patent right vertebral artery to the skullbase without stenosis. Bulky soft plaque in the proximal left subclavian artery resulting an stenosis up to 65-70 % with respect to the distal vessel (series 501, image 27). The left subclavian remains patent. No stenosis at the left vertebral artery origin. Codominant vertebral arteries. No left vertebral artery stenosis to the skullbase. CTA HEAD Posterior circulation: Codominant distal vertebral arteries are patent without stenosis. Patent vertebrobasilar junction. Normal right PICA origin. Dominant appearing left AICA. Tortuous basilar artery without stenosis. Normal SCA and PCA origins. Normal bilateral PCA branches. Anterior circulation: Both ICA siphons are patent. There is soft plaque suspected in much of the right petrous segment without hemodynamically significant stenosis. Mild calcified plaque. Mild cavernous segment tortuosity. No siphon stenosis. Normal ophthalmic artery origins. Patent carotid termini. Normal MCA origins. Normal ACA origins. The left A1 is dominant. Anterior communicating artery and visualized bilateral ACA branches are within normal limits. Left MCA M1 segment, bifurcation, and left MCA branches are within normal limits. Right MCA M1 segment, bifurcation, and right MCA branches are within normal limits. Venous sinuses: Patent. Anatomic variants: None. Delayed phase: No abnormal enhancement identified. IMPRESSION: 1. Negative for emergent large vessel occlusion. Widespread atherosclerotic plaque, but no hemodynamically significant carotid, vertebral, or intracranial artery stenosis. 2. There is high-grade proximal left  subclavian artery stenosis estimated at 65-70%. 3. Expected CT appearance of the superior hemispheric diffusion positive lesions seen by MRI yesterday. No associated hemorrhage or mass effect. 4. No acute findings in the neck. Endotracheal tube terminates above the carina, enteric tube courses within the thoracic esophagus. Electronically Signed   By: Genevie Ann M.D.   On: 07/15/2015 11:36   Dg Abd 1 View  07/17/2015  CLINICAL DATA:  75 year old male status post NG tube placement. EXAM: ABDOMEN - 1 VIEW COMPARISON:  03/12/2015. FINDINGS: Nasogastric tube coiled in the proximal stomach. Gas is noted throughout the colon. Several nondilated gas-filled loops of small bowel are also noted. No gross evidence of pneumoperitoneum identified on this single supine view. IMPRESSION: 1. Tip of nasogastric tube is in the proximal stomach. Electronically Signed   By: Vinnie Langton M.D.   On: 07/17/2015 14:49   Ct Head Wo Contrast  07/13/2015  CLINICAL DATA:  Altered mental status EXAM: CT HEAD WITHOUT CONTRAST TECHNIQUE: Contiguous axial images were obtained from the base of the skull through the vertex without intravenous contrast. COMPARISON:  07/08/2015 FINDINGS: Bony calvarium is intact. Diffuse atrophic changes are noted as well as chronic white matter ischemic change. The overall appearance is similar to that seen on recent MRI examination. No acute hemorrhage, acute infarction or space-occupying mass lesion is seen IMPRESSION: Chronic atrophic and ischemic changes are noted. No acute abnormality seen. Electronically Signed   By: Inez Catalina M.D.   On: 07/13/2015 19:21   Ct Head Wo Contrast  07/08/2015  CLINICAL DATA:  Change in mental status. EXAM: CT HEAD WITHOUT CONTRAST TECHNIQUE: Contiguous axial images were obtained from the base of the skull through the vertex without intravenous contrast.  COMPARISON:  CT 5-1/2 hours prior.  Brain MRI 03/13/2015 FINDINGS: No change from prior exam. Advanced cerebral and  cerebellar atrophy again seen. Advanced chronic small vessel ischemia. No evidence of developing infarct. No interval hemorrhage. Unchanged opacification of the left maxillary sinus, ethmoid air cell and frontal sinus to lesser extent. IMPRESSION: Advanced atrophy and chronic small vessel ischemia. No change from exam 5 hours prior. These results will be called to the ordering clinician or representative by the Radiologist Assistant, and communication documented in the PACS or zVision Dashboard. Electronically Signed   By: Jeb Levering M.D.   On: 07/08/2015 00:52   Ct Head Wo Contrast  06/23/2015  CLINICAL DATA:  Acute onset of confusion and aphasia.  Code stroke. EXAM: CT HEAD WITHOUT CONTRAST TECHNIQUE: Contiguous axial images were obtained from the base of the skull through the vertex without intravenous contrast. COMPARISON:  03/13/2015 FINDINGS: The brain shows advanced generalized atrophy. There are extensive chronic small vessel ischemic changes throughout the white matter. No identifiable acute infarction, mass lesion, hemorrhage, hydrocephalus or extra-axial collection. No calvarial abnormality. Sinuses are clear except for the left maxillary sinus which is opacified. There is atherosclerotic calcification of the major vessels at the base of the brain. IMPRESSION: No acute finding by CT. Advanced generalized brain atrophy and chronic small vessel ischemic changes. These results were called by telephone at the time of interpretation on 06/28/2015 at 6:54 pm to Dr. Gareth Morgan , who verbally acknowledged these results. Electronically Signed   By: Nelson Chimes M.D.   On: 07/18/2015 18:57   Ct Angio Neck W/cm &/or Wo/cm  07/15/2015  CLINICAL DATA:  75 year old male with widespread superior hemisphere restricted diffusion on brain MRI yesterday possibly related to emboli or severe posterior reversible encephalopathy syndrome. Initial encounter. EXAM: CT ANGIOGRAPHY HEAD AND NECK TECHNIQUE:  Multidetector CT imaging of the head and neck was performed using the standard protocol during bolus administration of intravenous contrast. Multiplanar CT image reconstructions and MIPs were obtained to evaluate the vascular anatomy. Carotid stenosis measurements (when applicable) are obtained utilizing NASCET criteria, using the distal internal carotid diameter as the denominator. CONTRAST:  50 mL Isovue 370 COMPARISON:  Brain MRI 07/14/2015. Head CT 07/13/2015. Intracranial MRA 02/01/2014 cervical spine CT 04/09/2012. FINDINGS: CT HEAD Brain: Abnormal cortical hypodensity in the bilateral superior frontal gyri, and to a lesser extent parietal lobes, corresponding to the abnormal diffusion. No associated hemorrhage or mass effect. Stable underlying cerebral volume. Stable gray-white matter differentiation elsewhere since 07/13/2015. No ventriculomegaly. Calvarium and skull base: Stable, negative. Paranasal sinuses: Stable left maxillary sinus opacification. Orbits: Stable, negative. CTA NECK Skeleton: Osteopenia. Exaggerated cervical lordosis with congenital appearing bilateral C6 lamina defects. Mild C6 compression vertebral compression. The appearance is unchanged from 2014. No acute osseous abnormality identified. Other neck: Intubated and oral enteric tube in place. Endotracheal tube tip terminates above the carina. Enteric tube courses within the thoracic esophagus. Moderate apical pulmonary scarring. Areas of centrilobular emphysema in the right upper lobe. No superior mediastinal lymphadenopathy. Diminutive or absent thyroid. Negative parapharyngeal and retropharyngeal spaces. Negative sublingual space. Atrophied or surgically absent submandibular glands. There is also degree of bilateral parotid atrophy. No cervical lymphadenopathy. Aortic arch: 3 vessel arch configuration. Moderate soft and calcified arch atherosclerosis. Right carotid system: No brachiocephalic artery stenosis despite soft plaque. No  right CCA origin stenosis. Mildly tortuous right CCA. Proximal to the right carotid bifurcation there is medial soft plaque in the right CCA resulting an less than 50 % stenosis  with respect to the distal vessel. This appears to be mildly ulcerated (series 501, image 70). At the right carotid bifurcation there is soft and calcified plaque but no proximal right ICA stenosis. Soft and calcified plaque distal to the right ICA bulb without stenosis. The vessel remains patent to the skullbase. Left carotid system: No left CCA origin stenosis. Soft plaque proximal to the left carotid bifurcation without stenosis. Soft and calcified plaque at the bifurcation without proximal left ICA stenosis. The left ICA is patent to the skullbase. Vertebral arteries: No proximal right subclavian artery stenosis despite soft and calcified plaque. No definite stenosis at the right vertebral artery origin. Patent right vertebral artery to the skullbase without stenosis. Bulky soft plaque in the proximal left subclavian artery resulting an stenosis up to 65-70 % with respect to the distal vessel (series 501, image 27). The left subclavian remains patent. No stenosis at the left vertebral artery origin. Codominant vertebral arteries. No left vertebral artery stenosis to the skullbase. CTA HEAD Posterior circulation: Codominant distal vertebral arteries are patent without stenosis. Patent vertebrobasilar junction. Normal right PICA origin. Dominant appearing left AICA. Tortuous basilar artery without stenosis. Normal SCA and PCA origins. Normal bilateral PCA branches. Anterior circulation: Both ICA siphons are patent. There is soft plaque suspected in much of the right petrous segment without hemodynamically significant stenosis. Mild calcified plaque. Mild cavernous segment tortuosity. No siphon stenosis. Normal ophthalmic artery origins. Patent carotid termini. Normal MCA origins. Normal ACA origins. The left A1 is dominant. Anterior  communicating artery and visualized bilateral ACA branches are within normal limits. Left MCA M1 segment, bifurcation, and left MCA branches are within normal limits. Right MCA M1 segment, bifurcation, and right MCA branches are within normal limits. Venous sinuses: Patent. Anatomic variants: None. Delayed phase: No abnormal enhancement identified. IMPRESSION: 1. Negative for emergent large vessel occlusion. Widespread atherosclerotic plaque, but no hemodynamically significant carotid, vertebral, or intracranial artery stenosis. 2. 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 3. Expected CT appearance of the superior hemispheric diffusion positive lesions seen by MRI yesterday. No associated hemorrhage or mass effect. 4. No acute findings in the neck. Endotracheal tube terminates above the carina, enteric tube courses within the thoracic esophagus. Electronically Signed   By: Genevie Ann M.D.   On: 07/15/2015 11:36   Mr Brain Wo Contrast  07/14/2015  CLINICAL DATA:  Hypertension and confusion, acute onset. EXAM: MRI HEAD WITHOUT CONTRAST TECHNIQUE: Multiplanar, multiecho pulse sequences of the brain and surrounding structures were obtained without intravenous contrast. COMPARISON:  Head CT 07/13/2015. MRI 07/08/2015. Multiple previous neuro imaging studies. FINDINGS: There are multiple newly seen areas of brain cortical and subcortical edema with mild swelling associated largely with restricted diffusion. These are most prominent along the surface of the brain running from front back at the vertex, right more than left. Chronic changes of brain atrophy an old small vessel insults within the brainstem, cerebellum and cerebral hemispheric white matter are again noted. Chronic abnormality of the corpus callosum is unchanged. I do not see any evidence of hemorrhage. No mass effect. No hydrocephalus. No extra-axial collection. No pituitary mass. There are inflammatory changes affecting  the left maxillary sinus. IMPRESSION: Newly seen extensive areas of restricted diffusion and brain edema in a patchy pattern primarily at the vertices, right more than left. Differential diagnosis is that of an atypical manifestation of posterior reversible encephalopathy/hypertensive encephalopathy versus is embolic infarctions. Due to the history of multiple presentations in the past during  episodes of severe hypertension, posterior reversible encephalopathy is favored. Electronically Signed   By: Nelson Chimes M.D.   On: 07/14/2015 19:18   Mr Brain Wo Contrast  07/08/2015  CLINICAL DATA:  Aphasia and change in mental status. Symptoms began and yesterday. The patient then became unresponsive and was intubated. EXAM: MRI HEAD WITHOUT CONTRAST TECHNIQUE: Multiplanar, multiecho pulse sequences of the brain and surrounding structures were obtained without intravenous contrast. COMPARISON:  CT head without contrast 07/08/2015. MRI brain 01/11/2015. FINDINGS: Advanced atrophy and diffuse white matter changes are evident bilaterally. There is diffuse white matter change within both the splenium and the anterior genu of the corpus callosum. Diffuse atrophy of the corpus callosum is noted. These findings are similar the prior study. Increased T2 signal is present within these cerebellar peduncle is bilaterally. Focal susceptibility within the dentate nuclei is stable. There is significant T2 shine through on the diffusion weighted images, similar to the prior exams. No focal restricted diffusion is present on the ADC map sub to suggest acute or subacute infarction. No acute hemorrhage or mass lesion is present. The ventricles are proportionate to the degree of atrophy and stable since the prior study. No significant extra-axial fluid collection is present. Flow is present in the major intracranial arteries. The globes and orbits are intact. Chronic left maxillary sinus disease is present with near complete opacification  of the left maxillary sinus. Asymmetric opacification of ethmoid air cells is noted on the left. Mild mucosal thickening is present in the sphenoid sinuses and frontal sinuses. There is some fluid in the mastoid air cells bilaterally without an obstructing nasopharyngeal lesion. Midline sagittal images demonstrate no focal lesions. Moderate degenerative changes are noted in the upper cervical spine, similar to the prior study. IMPRESSION: 1. No acute intracranial abnormality or significant interval change. 2. Advanced atrophy and diffuse white matter disease bilaterally. Extensive subcortical white matter changes bilaterally are likely related to the remote ischemic disease. These findings are stable. 3. 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 Electronically Signed   By: San Morelle M.D.   On: 07/08/2015 17:05   Dg Chest Port 1 View  07/21/2015  CLINICAL DATA:  Acute onset shortness of breath earlier today EXAM: PORTABLE CHEST 1 VIEW COMPARISON:  July 17, 2015 FINDINGS: There is persistent patchy opacity in the left base, likely atelectasis with minimal left effusion. Lungs elsewhere clear. Heart size and pulmonary vascularity are normal. No adenopathy. Nasogastric tube tip and side port are in the stomach. Patient has had to foramen ovale closure with closure device apparent. IMPRESSION: Probable atelectasis in the left base. A small focus of left base pneumonia is possible, however. No new opacity is apparent in this area compared to recent prior study. Lungs elsewhere clear. Stable cardiac silhouette. Nasogastric tube tip and side port are in the stomach. Electronically Signed   By: Lowella Grip III M.D.   On: 07/21/2015 08:16   Dg Chest Port 1 View  07/17/2015  CLINICAL DATA:  Patient with acute respiratory failure. EXAM: PORTABLE CHEST 1 VIEW COMPARISON:  Chest radiograph 07/15/2015. FINDINGS: Patient is rotated to the left. Interval extubation and removal of enteric tube. Stable  enlarged cardiac and mediastinal contours. Small left pleural effusion. Underlying airspace opacities favored to represent atelectasis. Cardiac septal occluder device. IMPRESSION: Interval extubation and removal of enteric tube. Small left pleural effusion and underlying opacities favored to represent atelectasis, stable. Electronically Signed   By: Lovey Newcomer M.D.   On: 07/17/2015 07:47  Dg Chest Port 1 View  07/15/2015  CLINICAL DATA:  Ventilator patient.  Acute respiratory failure. EXAM: PORTABLE CHEST 1 VIEW COMPARISON:  07/14/2015 and 07/13/2015. FINDINGS: 0721 hours. The endotracheal and nasogastric tubes appear unchanged. The heart size and mediastinal contours are stable. Cardiac septal defect occluder device noted. The overall pulmonary aeration has improved. There is residual patchy left lower lobe airspace disease. No significant pleural effusion or pneumothorax. IMPRESSION: Overall improving aeration of the lungs with residual probable left lower lobe atelectasis. Electronically Signed   By: Richardean Sale M.D.   On: 07/15/2015 07:37   Dg Chest Port 1 View  07/14/2015  CLINICAL DATA:  Acute respiratory failure EXAM: PORTABLE CHEST 1 VIEW COMPARISON:  07/13/15 FINDINGS: Endotracheal tube and nasogastric catheter are again identified and stable in appearance. Patchy changes are again noted in the bases bilaterally left greater than right. No new focal abnormality is seen. IMPRESSION: Stable appearance when compared with the prior exam. Electronically Signed   By: Inez Catalina M.D.   On: 07/14/2015 07:38   Dg Chest Port 1 View  07/13/2015  CLINICAL DATA:  Check endotracheal tube placement EXAM: PORTABLE CHEST 1 VIEW COMPARISON:  07/11/2015 FINDINGS: Cardiac shadow is within normal limits. The lungs are well aerated bilaterally. Patchy infiltrates are again seen particularly in the left mid lung and left base. Some suggestion of early infiltrate is noted in the right medial lung base. A  nasogastric catheter is seen in the stomach. The endotracheal tube lies in the trachea approximately 4 cm above the carina. No bony abnormality is seen. IMPRESSION: Endotracheal tube and nasogastric catheter as described. Patchy infiltrative changes left greater than right. Electronically Signed   By: Inez Catalina M.D.   On: 07/13/2015 18:40   Dg Chest Port 1 View  07/11/2015  CLINICAL DATA:  Hypertension, coronary artery disease EXAM: PORTABLE CHEST 1 VIEW COMPARISON:  07/09/2015 FINDINGS: Interval extubation. RIGHT central venous line remains. Normal cardiac silhouette. There is LEFT lower lobe airspace disease not changed. No pneumothorax. IMPRESSION: 1. Interval extubation without complication. 2. Persistent LEFT lower lobe airspace opacity. Electronically Signed   By: Suzy Bouchard M.D.   On: 07/11/2015 09:04   Dg Chest Port 1 View  07/09/2015  CLINICAL DATA:  Aspiration pneumonia, intubated patient, history of TIA. EXAM: PORTABLE CHEST 1 VIEW COMPARISON:  Portable chest x-ray of July 08, 2015. FINDINGS: The right lung is adequately inflated and clear. On the left there is persistent retrocardiac and lower lobe density slightly more conspicuous today. The cardiac silhouette is normal in size. The pulmonary vascularity is not engorged. The endotracheal tube tip lies approximately 3.9 cm above the carina. The esophagogastric tube tip projects below the inferior margin of the image. The right internal jugular venous catheter tip projects over the midportion of the SVC. IMPRESSION: Slight interval increase in left lower lobe parenchymal density consistent with aspiration pneumonia. The right lung is clear. There is no pulmonary edema. The support tubes are in reasonable position. Electronically Signed   By: David  Martinique M.D.   On: 07/09/2015 07:27   Dg Chest Port 1 View  07/08/2015  CLINICAL DATA:  Central line placement. EXAM: PORTABLE CHEST 1 VIEW COMPARISON:  Earlier film, same date. FINDINGS: The  right IJ center venous catheter tip is in the distal SVC near the cavoatrial junction. The endotracheal tube and NG tubes are stable. The heart and lungs are relatively stable. Slight improved aeration in the left lower lobe. IMPRESSION: Right IJ center venous  catheter tip is in the distal SVC near the cavoatrial junction. No complicating features. The other support apparatus is stable. Slight improved left lower lobe aeration. Electronically Signed   By: Marijo Sanes M.D.   On: 07/08/2015 10:51   Dg Chest Portable 1 View  07/08/2015  CLINICAL DATA:  Post intubation EXAM: PORTABLE CHEST 1 VIEW COMPARISON:  07/17/2015 FINDINGS: Interval placement of an endotracheal tube with tip measuring 3.6 cm above the carina. Enteric tube tip is not visualized but is below the left hemidiaphragm, likely in the distal stomach. Borderline heart size with normal pulmonary vascularity. Focal airspace consolidation in the left mid lung as before. Developing consolidation in the right upper lung. No blunting of costophrenic angles. No pneumothorax. Calcification of the aorta. IMPRESSION: Appliances appear in satisfactory position. Focal consolidation in the left mid lung with developing consolidation in the right upper lung. Electronically Signed   By: Lucienne Capers M.D.   On: 07/08/2015 01:12   Dg Chest Port 1 View  07/12/2015  CLINICAL DATA:  Pneumonia.  Shortness of breath. EXAM: PORTABLE CHEST 1 VIEW COMPARISON:  Earlier same day.  04/15/2015. FINDINGS: Compared to the study of only 4 hours ago, there is rapidly worsening infiltrate in the left lower lobe. This raises the possibility of aspiration. Right chest appears clear in this projection. Presumed Watchman device projects over the heart. IMPRESSION: Development of rapid airspace filling in the left lower lobe consistent with pneumonia. Rapid development suggests possible aspiration. Electronically Signed   By: Nelson Chimes M.D.   On: 06/28/2015 23:49   Dg Chest  Portable 1 View  07/16/2015  CLINICAL DATA:  Code stroke.  Altered mental status. EXAM: PORTABLE CHEST 1 VIEW COMPARISON:  04/15/2015 FINDINGS: The patient has taken a poor inspiration. The heart is at the upper limits of normal in size. There is calcification of the thoracic aorta. Accentuated markings at the lung bases could relate to the poor inspiration. Mild basilar pneumonia not excluded on this portable exam. The upper lungs are clear. No evidence of heart failure or effusion. IMPRESSION: Poor inspiration. Prominent markings at the bases that could relate to the poor inspiration or could be secondary to basilar pneumonia. Electronically Signed   By: Nelson Chimes M.D.   On: 07/03/2015 19:45   Dg Abd Portable 1v  07/18/2015  CLINICAL DATA:  Tube placement EXAM: PORTABLE ABDOMEN - 1 VIEW COMPARISON:  07/17/2015 FINDINGS: The enteric tube extends into the stomach with tip in the region of the gastric fundus. IMPRESSION: Enteric tube extends into the stomach. Electronically Signed   By: Andreas Newport M.D.   On: 07/18/2015 23:28   Dg Swallowing Func-speech Pathology  07/12/2015  Objective Swallowing Evaluation: Type of Study: MBS-Modified Barium Swallow Study Patient Details Name: GAREY TURK MRN: [id number] Date of Birth: Jul 21, 1940 Today's Date: 07/12/2015 Time: SLP Start Time (ACUTE ONLY): 1254-SLP Stop Time (ACUTE ONLY): 1307 SLP Time Calculation (min) (ACUTE ONLY): 13 min Past Medical History: Past Medical History Diagnosis Date . Hx of transient ischemic attack (TIA)  . Hyperlipidemia  . ED (erectile dysfunction)  . Personal history of kidney stones  . Subarachnoid hemorrhage ([place])    '11- hospitalized -3 to 4 days(passed out-awoke in hospital)- no residual affects. . Myocardial infarction ([place])    12/1991 . Dysphagia  . Hypokalemia  . Hypothyroidism  . Sleep apnea    on CPAP-settings 6 . History of kidney stones 02-27-13   x5- none recent . Throat cancer [facility])    '  00-radiation therapy . Stroke  ([place])  . Unspecified cerebral artery occlusion with cerebral infarction 11/08/2012 . Episodic confusion 02-27-13   hx. of, not currently(multiple episodes- ? TIA's)-always saw MD to be evaluated. Marland Kitchen AKI (acute kidney injury) ([place]) 02/23/2011   pt. denies any problems 02-27-13 . Hypertension    hx. labile - with spikes every 3-6 mos.-last 8'14 ED visit. Marland Kitchen Hypertensive encephalopathy  . Headache(784.0)    episodic with periods of confusion -multiple . Neuromuscular disorder ([place])    "parkinson's" -no tremor-sees neurology MD at [facility] . Coronary artery disease    Dr. Martinique Past Surgical History: Past Surgical History Procedure Laterality Date . Angioplasty  1993   LAD . Cardiac catheterization   . Coronary angioplasty   . Basal cell cancer     head . Hernia repair   . Appendectomy   . Patent foramen ovale closure  2008 . Eye surgery   . Colonoscopy N/A 06/18/2012   Procedure: COLONOSCOPY;  Surgeon: Jeryl Columbia, MD;  Location: [facility] ENDOSCOPY;  Service: Endoscopy;  Laterality: N/A;  h&p in file-[place]  . Hot hemostasis N/A 06/18/2012   Procedure: HOT HEMOSTASIS (ARGON PLASMA COAGULATION/BICAP);  Surgeon: Jeryl Columbia, MD;  Location: [facility] ENDOSCOPY;  Service: Endoscopy;  Laterality: N/A; . Colonoscopy with propofol N/A 03/07/2013   Procedure: COLONOSCOPY WITH PROPOFOL;  Surgeon: Jeryl Columbia, MD;  Location: [facility] ENDOSCOPY;  Service: Endoscopy;  Laterality: N/A; HPI: 75 y.o. male with PMH as outlined below including uncontrolled hypertension for which he has had multiple admissions. He was brought to [facility] ED 04/19 evening by his wife who informed staff that he was having a stroke due to the fact that he was unable to follow commands and had gargled speech. He has hx of hemorrhagic stroke and apparently presented very similarly at the time. He was last seen normal around 1pm earlier that afternoon. Wife stated that he never had any focal deficits, only gargled speech and inability follow commands.  Subjective: pt alert, pleasant,  HOH, says he coughs all the time when eating and drinking Assessment / Plan / Recommendation CHL IP CLINICAL IMPRESSIONS 07/12/2015 Therapy Diagnosis Mild oral phase dysphagia;Severe pharyngeal phase dysphagia Clinical Impression Pt has a mild oral and severe pharyngeal dysphagia, which appears only mildly exacerbated from baseline swallowing function when compared to prior MBS studies. Oral phase is discoordinated with poor bolus cohesion and prolonged transit time, but ultimately with good clearance. He has a delay in swallow trigger with all consistencies reaching the pyriform sinuses, which results in aspiration of honey thick liquids and purees. Aspiration is intermittently sensed, with delayed and inconsistent coughing not entirely effective at clearing aspirates. With Max cues for a chin tuck, he is able to protect his airway with purees and Dys 2 textures. Mild residue remains in the valleculae, although it is not significantly changed with more solid boluses. Recommend Dys 2 diet and pudding thick liquids with full supervision for use of chin tuck. Impact on safety and function Moderate aspiration risk;Severe aspiration risk   CHL IP TREATMENT RECOMMENDATION 07/12/2015 Treatment Recommendations Therapy as outlined in treatment plan below   Prognosis 07/12/2015 Prognosis for Safe Diet Advancement Good Barriers to Reach Goals Cognitive deficits;Severity of deficits;Time post onset Barriers/Prognosis Comment -- CHL IP DIET RECOMMENDATION 07/12/2015 SLP Diet Recommendations Dysphagia 2 (Fine chop) solids;Pudding thick liquid Liquid Administration via Spoon Medication Administration Crushed with puree Compensations Minimize environmental distractions;Slow rate;Small sips/bites;Chin tuck Postural Changes Remain semi-upright after after feeds/meals (Comment);Seated upright at 90 degrees  CHL IP OTHER RECOMMENDATIONS 07/12/2015 Recommended Consults -- Oral Care Recommendations Oral care BID Other Recommendations Order  thickener from pharmacy;Prohibited food (jello, ice cream, thin soups);Remove water pitcher   CHL IP FOLLOW UP RECOMMENDATIONS 07/12/2015 Follow up Recommendations (No Data)   CHL IP FREQUENCY AND DURATION 07/12/2015 Speech Therapy Frequency (ACUTE ONLY) min 2x/week Treatment Duration 2 weeks      CHL IP ORAL PHASE 07/12/2015 Oral Phase Impaired Oral - Pudding Teaspoon -- Oral - Pudding Cup -- Oral - Honey Teaspoon Decreased bolus cohesion;Delayed oral transit Oral - Honey Cup -- Oral - Nectar Teaspoon -- Oral - Nectar Cup -- Oral - Nectar Straw -- Oral - Thin Teaspoon -- Oral - Thin Cup -- Oral - Thin Straw -- Oral - Puree Decreased bolus cohesion;Delayed oral transit Oral - Mech Soft Decreased bolus cohesion;Delayed oral transit Oral - Regular -- Oral - Multi-Consistency -- Oral - Pill -- Oral Phase - Comment --  CHL IP PHARYNGEAL PHASE 07/12/2015 Pharyngeal Phase Impaired Pharyngeal- Pudding Teaspoon -- Pharyngeal -- Pharyngeal- Pudding Cup -- Pharyngeal -- Pharyngeal- Honey Teaspoon Delayed swallow initiation-pyriform sinuses;Reduced anterior laryngeal mobility;Reduced laryngeal elevation;Reduced tongue base retraction;Penetration/Aspiration before swallow;Pharyngeal residue - valleculae;Compensatory strategies attempted (with notebox) Pharyngeal Material enters airway, passes BELOW cords without attempt by patient to eject out (silent aspiration) Pharyngeal- Honey Cup -- Pharyngeal -- Pharyngeal- Nectar Teaspoon -- Pharyngeal -- Pharyngeal- Nectar Cup -- Pharyngeal -- Pharyngeal- Nectar Straw -- Pharyngeal -- Pharyngeal- Thin Teaspoon -- Pharyngeal -- Pharyngeal- Thin Cup -- Pharyngeal -- Pharyngeal- Thin Straw -- Pharyngeal -- Pharyngeal- Puree Delayed swallow initiation-pyriform sinuses;Reduced anterior laryngeal mobility;Reduced laryngeal elevation;Reduced tongue base retraction;Penetration/Aspiration before swallow;Pharyngeal residue - valleculae;Compensatory strategies attempted (with notebox) Pharyngeal  Material enters airway, passes BELOW cords without attempt by patient to eject out (silent aspiration) Pharyngeal- Mechanical Soft Delayed swallow initiation-pyriform sinuses;Reduced anterior laryngeal mobility;Reduced laryngeal elevation;Reduced tongue base retraction;Pharyngeal residue - valleculae;Compensatory strategies attempted (with notebox) Pharyngeal Material does not enter airway Pharyngeal- Regular -- Pharyngeal -- Pharyngeal- Multi-consistency -- Pharyngeal -- Pharyngeal- Pill -- Pharyngeal -- Pharyngeal Comment --  CHL IP CERVICAL ESOPHAGEAL PHASE 07/12/2015 Cervical Esophageal Phase Impaired Pudding Teaspoon -- Pudding Cup -- Honey Teaspoon Esophageal backflow into cervical esophagus Honey Cup -- Nectar Teaspoon -- Nectar Cup -- Nectar Straw -- Thin Teaspoon -- Thin Cup -- Thin Straw -- Puree Esophageal backflow into cervical esophagus Mechanical Soft Esophageal backflow into cervical esophagus Regular -- Multi-consistency -- Pill -- Cervical Esophageal Comment -- No flowsheet data found. [name], M.A. CCC-SLP [phone] [name] 07/12/2015, 1:44 PM               Time Spent in minutes  35   Louellen Molder M.D on 07/21/2015 at 2:13 PM  Between 7am to 7pm - Pager - [phone]  After 7pm go to [email or web address] - password [facility]  [facility] -  Office  [phone]

## 2015-07-21 NOTE — Progress Notes (Signed)
RN requested NTS via RT. RT assessed PT, BBS clear to diminished, Sp02 98% (NRB), RR 20, no accessory muscle usage at this time. PT states he is not having difficulty breathing at this time. PT caregiver at bedside states she feels he has "calmed down" in the last few minutes. RT encouraged PT and caregiver to notify staff if NTS is needed hereafter.

## 2015-07-22 LAB — GLUCOSE, CAPILLARY
GLUCOSE-CAPILLARY: 155 mg/dL — AB (ref 65–99)
GLUCOSE-CAPILLARY: 135 mg/dL — AB (ref 65–99)
Glucose-Capillary: 119 mg/dL — ABNORMAL HIGH (ref 65–99)
Glucose-Capillary: 141 mg/dL — ABNORMAL HIGH (ref 65–99)
Glucose-Capillary: 117 mg/dL — ABNORMAL HIGH (ref 65–99)

## 2015-07-22 LAB — BASIC METABOLIC PANEL
Anion gap: 13 (ref 5–15)
BUN: 36 mg/dL — AB (ref 6–20)
CHLORIDE: 116 mmol/L — AB (ref 101–111)
CO2: 24 mmol/L (ref 22–32)
Calcium: 8.9 mg/dL (ref 8.9–10.3)
Creatinine, Ser: 1.36 mg/dL — ABNORMAL HIGH (ref 0.61–1.24)
GFR calc Af Amer: 58 mL/min — ABNORMAL LOW (ref >60)
GFR, EST NON AFRICAN AMERICAN: 50 mL/min — AB (ref >60)
GLUCOSE: 114 mg/dL — AB (ref 65–99)
POTASSIUM: 3.8 mmol/L (ref 3.5–5.1)
Sodium: 153 mmol/L — ABNORMAL HIGH (ref 135–145)

## 2015-07-22 LAB — CBC
HEMATOCRIT: 44.8 % (ref 39.0–52.0)
HEMOGLOBIN: 14.1 g/dL (ref 13.0–17.0)
MCH: 31.6 pg (ref 26.0–34.0)
MCHC: 31.5 g/dL (ref 30.0–36.0)
MCV: 100.4 fL — ABNORMAL HIGH (ref 78.0–100.0)
PLATELETS: 239 10*3/uL (ref 150–400)
RBC: 4.46 MIL/uL (ref 4.22–5.81)
RDW: 13.9 % (ref 11.5–15.5)
WBC: 15.2 10*3/uL — ABNORMAL HIGH (ref 4.0–10.5)

## 2015-07-22 MED ORDER — DEXTROSE 5 % IV SOLN
INTRAVENOUS | Status: DC
  Administered 2015-07-22 – 2015-07-23 (×3): via INTRAVENOUS

## 2015-07-22 NOTE — Progress Notes (Signed)
Speech Language Pathology Treatment: Dysphagia  Patient Details Name: Benjamin Park MRN: [id number] DOB: May 18, 1940 Today's Date: 07/22/2015 Time: [id number] SLP Time Calculation (min) (ACUTE ONLY): 19 min  Assessment / Plan / Recommendation Clinical Impression  Upon SLP arrival, pt's RR ranged from 40-45 (RN made aware). SLP provided stimulation for increased arousal, repositioning, and oral care with RR more stabilized in the 20s. [name] was present for the start of therapy and was educated on the impact of poor swallowing function on secretion management and consequently respirations. Ice chip trials initiated with Mod cues for oral manipulation, but stopped due to pt's inability to swallow this morning. When cued to swallow, minimal hyolaryngeal movements were noted but a true swallow response was not elicited. Recommend to remain NPO with frequent oral care to assist with secretion management and comfort. [name] returned at end of session and was updated on current status.   HPI HPI: 75 y.o. male with PMH as outlined below including uncontrolled hypertension, Parkinsons' disease.  He was brought to [facility] ED 04/19 evening by his wife who informed staff that he was having a stroke due to the fact that he was unable to follow commands and had gargled speech. He has hx of hemorrhagic stroke.Pt has hx of throat cancer with radiation from 2000; he has long hx of dysphagia with repeated MBS studies and frequent aspiration.   Last study completed showed mild oral and severe pharyngeal phase dysphagia.  Diet recommended was dys2/pudding.  Pt required intubation,  CXR showed LLL infiltrate likely related to aspiration.        SLP Plan  Continue with current plan of care     Recommendations  Diet recommendations: NPO Medication Administration: Via alternative means             Oral Care Recommendations: Oral care QID Follow up Recommendations:  (tba pending prognosis/GOC) Plan: Continue with  current plan of care     GO               Benjamin Park, M.A. CCC-SLP [phone]  Benjamin Park 07/22/2015, 10:01 AM

## 2015-07-22 NOTE — Progress Notes (Signed)
Physical Therapy Treatment Patient Details Name: Benjamin Park MRN: [id number] DOB: 1940/06/18 Today's Date: 07/22/2015    History of Present Illness Patient is a 75 yo male admitted 06/19/2015 with hypertensive encephalopathy. Intubated due to hypoxia. Extubated 07/09/15. PMH:  TIA/CVA, SAH, MI, CAD, dysphagia, Parkinson's. Prior to discharge, pt noted to have desaturations on his O2 stats and concerns for aspiration. change in mental status, elevated BP to the 123456 systolic required intubation for airway protection and transferred to the ICU. 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       PT Comments    Pt is now on venti mask.  He is very fatigued and did not tolerate very long on EOB.  VSS throughout session and he is very dense L hemiparesis.  At this point, he is more appropriate for SNF level rehab as he is not tolerating much mobility and will likely need a long rehab course.  If he starts to be able to do more, then I would reconsider CIR recommendation he is just so weak and fatigued today.    Follow Up Recommendations  SNF     Equipment Recommendations  Wheelchair (measurements PT);Wheelchair cushion (measurements PT);Hospital bed;Other (comment) (hoyer lift)    Recommendations for Other Services   NA     Precautions / Restrictions Precautions Precautions: Fall Precaution Comments: dense left hemi, weak right side    Mobility  Bed Mobility Overal bed mobility: Needs Assistance;+2 for physical assistance Bed Mobility: Supine to Sit;Sit to Supine     Supine to sit: +2 for safety/equipment;Total assist Sit to supine: +2 for safety/equipment;Total assist   General bed mobility comments: Two person total assist to get to and from sitting.  Pt not initiating to help with transitions.       Modified Rankin (Stroke Patients Only) Modified Rankin (Stroke Patients Only) Pre-Morbid Rankin Score: Moderate disability Modified Rankin: Severe  disability     Balance Overall balance assessment: Needs assistance Sitting-balance support: Feet supported;Single extremity supported Sitting balance-Leahy Scale: Zero Sitting balance - Comments: Pt is unable to support himself without support at his trunk, however, he is not leaning heavily backwards and was able to participate in a short session EOB before he fatigued.  We worked on command following related to moving his right side arm and leg.  Pt having difficulty holding his head up both because of premorbid kyphosis and forward head posture and weakness.  Postural control: Other (comment) (very kyphotic with significant forward head)                          Cognition Arousal/Alertness: Lethargic Behavior During Therapy: Flat affect Overall Cognitive Status: Difficult to assess Area of Impairment: Attention;Following commands;Safety/judgement;Awareness;Problem solving   Current Attention Level: Focused (very fatigued today)   Following Commands: Follows one step commands with increased time;Follows one step commands inconsistently (~75% of one step commands on R side, very HOH)   Awareness: Intellectual (pt able to report he cannot move L arm/leg) Problem Solving: Slow processing;Decreased initiation;Difficulty sequencing;Requires verbal cues;Requires tactile cues (fatigue may also affect this today) General Comments: Pt is very lethargic, sat EOB <8 mins and this seemed to have significantly fatigued him.      Exercises Other Exercises Other Exercises: PROM to all extremities except L shoulder (did not range due to joint instability possible).      General Comments General comments (skin integrity, edema, etc.): VSS on venti mask during session  O2 sats remained in the 90s and RR in the mid 20s.  HR 112      Pertinent Vitals/Pain Pain Assessment: No/denies pain Faces Pain Scale: Hurts little more Pain Location: not able to state Pain Intervention(s): Limited  activity within patient's tolerance;Monitored during session           PT Goals (current goals can now be found in the care plan section) Acute Rehab PT Goals Patient Stated Goal: unable to state, wife would like to keep him full code for now  Progress towards PT goals: Not progressing toward goals - comment (pt was unable to tolerate much therapy today)    Frequency  Min 2X/week    PT Plan Discharge plan needs to be updated;Frequency needs to be updated       End of Session Equipment Utilized During Treatment: Oxygen;Other (comment) (venti mask) Activity Tolerance: Patient limited by fatigue Patient left: in bed;with call bell/phone within reach;with family/visitor present     Time: [id number] PT Time Calculation (min) (ACUTE ONLY): 39 min  Charges:  $Therapeutic Exercise: 8-22 mins $Therapeutic Activity: 23-37 mins                      Sidnee Gambrill B. [place], [place], DPT [phone]   07/22/2015, 12:12 PM

## 2015-07-22 NOTE — Progress Notes (Signed)
PROGRESS NOTE                                                                                                                                                                                                             Patient Demographics:    Wilian Ebanks, is a 75 y.o. male, DOB - December 17, 1940, [id number]  Admit date - 07/11/2015   Admitting Physician Rush Farmer, MD  Outpatient Primary MD for the patient is No primary care provider on file.  LOS - 15  Outpatient Specialists:   No chief complaint on file.      Brief Narrative   75 year old male with history of uncontrolled hypertension, history of TIAs and stroke, frequent falls, sleep apnea, history of subarachnoid hemorrhage, MI with angioplasty, dysphagia, hypothyroidism, neuromuscular disorder, hyperlipidemia was brought to the hospital on 4/19 after he had slurred speech and unable to follow commands. He had history of hemorrhagic stroke previously with similar presentation. In the ED he had elevated blood pressure in the low 200s and was started on a new nicardipine drip but rate showed his blood pressure dropped into low 100s. Head CT was unremarkable for acute infarct or hemorrhage. Neurology evaluated the patient in the ED and while he was then he had acute change in mental status and became minimally responsive. Chest x-ray showed new left lower lobe infiltrate concerning for aspiration.  he was admitted to the ICU intubated. He was then transferred to medical floor on 4/21 after being successfully extubated. Started to improve and was planned for discharge on 4/25 but desaturated again with concern for repeat aspiration, became lethargic and blood pressure again elevated to [id number] systolic. Patient was again intubated for airway protection and transferred to ICU. Patient was then found to have left-sided hemiparesis. MRI was done which  upon review by stroke team  suggested to have bilateral frontal MCA, ACA infarct of embolic pattern. EEG done showed triphasic wave without seizure activity. Patient was extubated on 4/29. Patient continues to have left-sided weakness and failed swallow. Transferred to hospitalist service on 5/1.  On 5/3 patient went into respiratory distress and hypertensive. Chest x-ray showed atelectasis. Possibly had a mucous plug and aspiration. Required NRB and Transfer to stepdown unit.     Subjective:      Assessment  & Plan :   Principal problem Acute  hypoxic respiratory failure Likely due to aspiration pneumonia. Required Intubation twice during hospital course. Completed antibiotic course on 4/28. Given recurrent aspiration on 5/3 placed him on Unasyn.  failed swallow evaluation recurrently.Started him on NG feeds on 5/2, held due to aspiration and respiratory distress. -Patient went into respiratory distress again on 5/3, Likely due to aspiration and mucous plug.  -Discussed treatment plan with wife. She would like to monitor for any progress for the next 24 hours.    Active Problems: Acute encephalopathy Likely a combination of aspiration pneumonia, malignant hypertension and CVA -Needs Aggressive blood pressure control. It appears that his mental status deteriorates every time his blood pressure increases. MRI suggests pt might have PRES but neurology thinks he has stroke. -on when necessary IV hydralazine scheduled IV metoprolol  Acute bilateral frontal embolic stroke Source unclear. 2-D echo with normal EF and no cardiac source of emboli. Lower extremity Doppler negative for DVT. Continue full dose aspirin and statin. Neurology recommends outpatient 30 day Holter monitoring upon discharge.  Uncontrolled hypertension with hypertensive emergency As above. scheduled metoprolol and when necessary hydralazine.  Hypothyroidism On Synthroid. (Held as patient is nothing by mouth.)  Severe dysphagia Continues to  feel swallow evaluation. Holding NG feeds due to encephalopathy and ongoing aspiration.  Hypernatremia Resume D5 water.   Hypokalemia Replenished    Goals of care Poor Overall prognosis. Palliative care consulted regarding goals of care discussion, tube feeds etc. had prolonged discussion with wife on 5/2. She wanted aggressive care including life support and PEG tube feeding. Discussed course of care again with wife at bedside. Would like to monitor for 24 hours for any improvement and then will decide on palliative care options.  Code Status : Full code  Family Communication  : Wife at bedside  Disposition Plan  :  Ongoing symptoms. Needs skilled nursing facility once clinically improved. If deteriorates, option would be for hospice.  Barriers For Discharge :  Active symptoms requiring step down monitoring.  Consults  :   PC CM Neurology Palliative care     DVT Prophylaxis  :  SQ heparin  Lab Results  Component Value Date   PLT 239 07/22/2015    CULTURES: Blood 04/20 > NG final Sputum 04/20 > Klebsiella pneumoniae, Pseudomonas (sensitivities reported)  ANTIBIOTICS: Zosyn 04/20 >>>4/28 Vanc 4/20>>>4/22, restarted 4/25>>4/27 Unasyn 5/3-   LINES/TUBES: ETT 04/20 >4/21, 4/25-29  Procedures Head CT MRI brain 2-D echo Doppler lower extremities EEG   Anti-infectives    Start     Dose/Rate Route Frequency Ordered Stop   07/21/15 1200  Ampicillin-Sulbactam (UNASYN) 3 g in sodium chloride 0.9 % 100 mL IVPB     3 g 100 mL/hr over 60 Minutes Intravenous Every 6 hours 07/21/15 1113     07/13/15 1800  vancomycin (VANCOCIN) IVPB 1000 mg/200 mL premix  Status:  Discontinued     1,000 mg 200 mL/hr over 60 Minutes Intravenous Every 12 hours 07/13/15 1757 07/15/15 1040   07/08/15 1800  vancomycin (VANCOCIN) IVPB 750 mg/150 ml premix  Status:  Discontinued     750 mg 150 mL/hr over 60 Minutes Intravenous Every 12 hours 07/08/15 0050 07/09/15 0942   07/08/15 0800   piperacillin-tazobactam (ZOSYN) IVPB 3.375 g     3.375 g 12.5 mL/hr over 240 Minutes Intravenous Every 8 hours 07/08/15 0050 07/15/15 2359   07/08/15 0100  vancomycin (VANCOCIN) IVPB 1000 mg/200 mL premix     1,000 mg 200 mL/hr over 60 Minutes Intravenous  Once 07/08/15  0050 07/08/15 0809   07/08/15 0100  piperacillin-tazobactam (ZOSYN) IVPB 3.375 g  Status:  Discontinued     3.375 g 100 mL/hr over 30 Minutes Intravenous  Once 07/08/15 0050 07/16/15 0750   07/08/15 0000  metroNIDAZOLE (FLAGYL) IVPB 500 mg     500 mg 100 mL/hr over 60 Minutes Intravenous  Once 07/02/2015 2359 07/08/15 1900   07/01/2015 2015  cefTRIAXone (ROCEPHIN) 1 g in dextrose 5 % 50 mL IVPB     1 g 100 mL/hr over 30 Minutes Intravenous  Once 07/18/2015 2012 07/13/2015 2152   07/01/2015 2015  azithromycin (ZITHROMAX) 500 mg in dextrose 5 % 250 mL IVPB     500 mg 250 mL/hr over 60 Minutes Intravenous  Once 06/21/2015 2012 07/06/2015 2318        Objective:   Filed Vitals:   07/22/15 0020 07/22/15 0500 07/22/15 0721 07/22/15 1200  BP: 124/88 126/96 116/94 111/89  Pulse: 86 106 115 116  Temp: 98.1 F (36.7 C) 98.8 F (37.1 C) 99.6 F (37.6 C) 98.8 F (37.1 C)  TempSrc: Axillary Axillary Axillary Oral  Resp: 16 21 16 22   Height:  6' (1.829 m)    Weight:  65.9 kg (145 lb 4.5 oz)    SpO2: 100% 96% 98% 98%    Wt Readings from Last 3 Encounters:  07/22/15 65.9 kg (145 lb 4.5 oz)  04/15/15 68.539 kg (151 lb 1.6 oz)  03/13/15 75.6 kg (166 lb 10.7 oz)     Intake/Output Summary (Last 24 hours) at 07/22/15 1359 Last data filed at 07/22/15 1200  Gross per 24 hour  Intake     60 ml  Output    600 ml  Net   -540 ml     Physical Exam  Gen: Not in distress on NRB, noncommunicative  HEENT: NG in place,  dry mucosa, supple neck Chest: Diminished breath sounds bilaterally CVS: S1 and S2 tachycardic, no murmurs rub or gallop GI: soft, NT, ND, BS+ Musculoskeletal: warm, no edema CNS: Awake but non-verbal, left  hemiparesis    Data Review:    CBC  Recent Labs Lab 07/16/15 0254 07/18/15 0333 07/19/15 0407 07/21/15 0949 07/22/15 0351  WBC 5.6 4.5 6.6 19.0* 15.2*  HGB 12.4* 14.1 14.6 15.6 14.1  HCT 38.0* 43.7 44.8 48.0 44.8  PLT 223 247 265 292 239  MCV 98.2 99.5 99.1 98.4 100.4*  MCH 32.0 32.1 32.3 32.0 31.6  MCHC 32.6 32.3 32.6 32.5 31.5  RDW 13.3 13.3 13.4 13.5 13.9    Chemistries   Recent Labs Lab 07/18/15 0333 07/19/15 0407 07/20/15 0617 07/21/15 0949 07/22/15 0351  NA 144 148* 148* 147* 153*  K 3.4* 3.5 3.7 3.2* 3.8  CL 109 110 112* 112* 116*  CO2 26 26 28 24 24   GLUCOSE 102* 112* 96 134* 114*  BUN 32* 40* 41* 29* 36*  CREATININE 1.07 1.13 1.12 1.07 1.36*  CALCIUM 8.9 9.1 8.9 9.1 8.9   ------------------------------------------------------------------------------------------------------------------ No results for input(s): CHOL, HDL, LDLCALC, TRIG, CHOLHDL, LDLDIRECT in the last 72 hours.  Lab Results  Component Value Date   HGBA1C 5.6 07/16/2015   ------------------------------------------------------------------------------------------------------------------ No results for input(s): TSH, T4TOTAL, T3FREE, THYROIDAB in the last 72 hours.  Invalid input(s): FREET3 ------------------------------------------------------------------------------------------------------------------ No results for input(s): VITAMINB12, FOLATE, FERRITIN, TIBC, IRON, RETICCTPCT in the last 72 hours.  Coagulation profile No results for input(s): INR, PROTIME in the last 168 hours.  No results for input(s): DDIMER in the last 72 hours.  Cardiac Enzymes  Recent Labs Lab 07/21/15 0949 07/21/15 1512 07/21/15 2225  TROPONINI <0.03 0.05* 0.05*   ------------------------------------------------------------------------------------------------------------------ No results found for: BNP  Inpatient Medications  Scheduled Meds: . ampicillin-sulbactam (UNASYN) IV  3 g Intravenous  Q6H  . antiseptic oral rinse  7 mL Mouth Rinse q12n4p  . aspirin  325 mg Per Tube Daily  . chlorhexidine  15 mL Mouth Rinse BID  . feeding supplement (JEVITY 1.2 CAL)  1,000 mL Per Tube Q24H  . heparin  5,000 Units Subcutaneous Q8H  . hydrALAZINE  25 mg Oral Q8H  . insulin aspart  0-9 Units Subcutaneous Q4H  . ketorolac  15 mg Intravenous Once  . labetalol  100 mg Per Tube BID  . levothyroxine  112 mcg Per Tube QAC breakfast  . metoprolol  5 mg Intravenous Q6H  . pantoprazole (PROTONIX) IV  40 mg Intravenous Q24H  . pravastatin  20 mg Per Tube q1800   Continuous Infusions: . dextrose 75 mL/hr at 07/22/15 0812   PRN Meds:.sodium chloride, fentaNYL (SUBLIMAZE) injection, hydrALAZINE  Micro Results No results found for this or any previous visit (from the past 240 hour(s)).  Radiology Reports Ct Angio Head W/cm &/or Wo Cm  07/15/2015  CLINICAL DATA:  75 year old male with widespread superior hemisphere restricted diffusion on brain MRI yesterday possibly related to emboli or severe posterior reversible encephalopathy syndrome. Initial encounter. EXAM: CT ANGIOGRAPHY HEAD AND NECK TECHNIQUE: Multidetector CT imaging of the head and neck was performed using the standard protocol during bolus administration of intravenous contrast. Multiplanar CT image reconstructions and MIPs were obtained to evaluate the vascular anatomy. Carotid stenosis measurements (when applicable) are obtained utilizing NASCET criteria, using the distal internal carotid diameter as the denominator. CONTRAST:  50 mL Isovue 370 COMPARISON:  Brain MRI 07/14/2015. Head CT 07/13/2015. Intracranial MRA 02/01/2014 cervical spine CT 04/09/2012. FINDINGS: CT HEAD Brain: Abnormal cortical hypodensity in the bilateral superior frontal gyri, and to a lesser extent parietal lobes, corresponding to the abnormal diffusion. No associated hemorrhage or mass effect. Stable underlying cerebral volume. Stable gray-white matter differentiation  elsewhere since 07/13/2015. No ventriculomegaly. Calvarium and skull base: Stable, negative. Paranasal sinuses: Stable left maxillary sinus opacification. Orbits: Stable, negative. CTA NECK Skeleton: Osteopenia. Exaggerated cervical lordosis with congenital appearing bilateral C6 lamina defects. Mild C6 compression vertebral compression. The appearance is unchanged from 2014. No acute osseous abnormality identified. Other neck: Intubated and oral enteric tube in place. Endotracheal tube tip terminates above the carina. Enteric tube courses within the thoracic esophagus. Moderate apical pulmonary scarring. Areas of centrilobular emphysema in the right upper lobe. No superior mediastinal lymphadenopathy. Diminutive or absent thyroid. Negative parapharyngeal and retropharyngeal spaces. Negative sublingual space. Atrophied or surgically absent submandibular glands. There is also degree of bilateral parotid atrophy. No cervical lymphadenopathy. Aortic arch: 3 vessel arch configuration. Moderate soft and calcified arch atherosclerosis. Right carotid system: No brachiocephalic artery stenosis despite soft plaque. No right CCA origin stenosis. Mildly tortuous right CCA. Proximal to the right carotid bifurcation there is medial soft plaque in the right CCA resulting an less than 50 % stenosis with respect to the distal vessel. This appears to be mildly ulcerated (series 501, image 70). At the right carotid bifurcation there is soft and calcified plaque but no proximal right ICA stenosis. Soft and calcified plaque distal to the right ICA bulb without stenosis. The vessel remains patent to the skullbase. Left carotid system: No left CCA origin stenosis. Soft plaque proximal to the left carotid bifurcation without stenosis. Soft  and calcified plaque at the bifurcation without proximal left ICA stenosis. The left ICA is patent to the skullbase. Vertebral arteries: No proximal right subclavian artery stenosis despite soft and  calcified plaque. No definite stenosis at the right vertebral artery origin. Patent right vertebral artery to the skullbase without stenosis. Bulky soft plaque in the proximal left subclavian artery resulting an stenosis up to 65-70 % with respect to the distal vessel (series 501, image 27). The left subclavian remains patent. No stenosis at the left vertebral artery origin. Codominant vertebral arteries. No left vertebral artery stenosis to the skullbase. CTA HEAD Posterior circulation: Codominant distal vertebral arteries are patent without stenosis. Patent vertebrobasilar junction. Normal right PICA origin. Dominant appearing left AICA. Tortuous basilar artery without stenosis. Normal SCA and PCA origins. Normal bilateral PCA branches. Anterior circulation: Both ICA siphons are patent. There is soft plaque suspected in much of the right petrous segment without hemodynamically significant stenosis. Mild calcified plaque. Mild cavernous segment tortuosity. No siphon stenosis. Normal ophthalmic artery origins. Patent carotid termini. Normal MCA origins. Normal ACA origins. The left A1 is dominant. Anterior communicating artery and visualized bilateral ACA branches are within normal limits. Left MCA M1 segment, bifurcation, and left MCA branches are within normal limits. Right MCA M1 segment, bifurcation, and right MCA branches are within normal limits. Venous sinuses: Patent. Anatomic variants: None. Delayed phase: No abnormal enhancement identified. IMPRESSION: 1. Negative for emergent large vessel occlusion. Widespread atherosclerotic plaque, but no hemodynamically significant carotid, vertebral, or intracranial artery stenosis. 2. 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 3. Expected CT appearance of the superior hemispheric diffusion positive lesions seen by MRI yesterday. No associated hemorrhage or mass effect. 4. No acute findings in the neck. Endotracheal tube  terminates above the carina, enteric tube courses within the thoracic esophagus. Electronically Signed   By: Genevie Ann M.D.   On: 07/15/2015 11:36   Dg Abd 1 View  07/17/2015  CLINICAL DATA:  75 year old male status post NG tube placement. EXAM: ABDOMEN - 1 VIEW COMPARISON:  03/12/2015. FINDINGS: Nasogastric tube coiled in the proximal stomach. Gas is noted throughout the colon. Several nondilated gas-filled loops of small bowel are also noted. No gross evidence of pneumoperitoneum identified on this single supine view. IMPRESSION: 1. Tip of nasogastric tube is in the proximal stomach. Electronically Signed   By: Vinnie Langton M.D.   On: 07/17/2015 14:49   Ct Head Wo Contrast  07/13/2015  CLINICAL DATA:  Altered mental status EXAM: CT HEAD WITHOUT CONTRAST TECHNIQUE: Contiguous axial images were obtained from the base of the skull through the vertex without intravenous contrast. COMPARISON:  07/08/2015 FINDINGS: Bony calvarium is intact. Diffuse atrophic changes are noted as well as chronic white matter ischemic change. The overall appearance is similar to that seen on recent MRI examination. No acute hemorrhage, acute infarction or space-occupying mass lesion is seen IMPRESSION: Chronic atrophic and ischemic changes are noted. No acute abnormality seen. Electronically Signed   By: Inez Catalina M.D.   On: 07/13/2015 19:21   Ct Head Wo Contrast  07/08/2015  CLINICAL DATA:  Change in mental status. EXAM: CT HEAD WITHOUT CONTRAST TECHNIQUE: Contiguous axial images were obtained from the base of the skull through the vertex without intravenous contrast. COMPARISON:  CT 5-1/2 hours prior.  Brain MRI 03/13/2015 FINDINGS: No change from prior exam. Advanced cerebral and cerebellar atrophy again seen. Advanced chronic small vessel ischemia. No evidence of developing infarct. No interval hemorrhage. Unchanged opacification  of the left maxillary sinus, ethmoid air cell and frontal sinus to lesser extent. IMPRESSION:  Advanced atrophy and chronic small vessel ischemia. No change from exam 5 hours prior. These results will be called to the ordering clinician or representative by the Radiologist Assistant, and communication documented in the PACS or zVision Dashboard. Electronically Signed   By: Jeb Levering M.D.   On: 07/08/2015 00:52   Ct Head Wo Contrast  06/30/2015  CLINICAL DATA:  Acute onset of confusion and aphasia.  Code stroke. EXAM: CT HEAD WITHOUT CONTRAST TECHNIQUE: Contiguous axial images were obtained from the base of the skull through the vertex without intravenous contrast. COMPARISON:  03/13/2015 FINDINGS: The brain shows advanced generalized atrophy. There are extensive chronic small vessel ischemic changes throughout the white matter. No identifiable acute infarction, mass lesion, hemorrhage, hydrocephalus or extra-axial collection. No calvarial abnormality. Sinuses are clear except for the left maxillary sinus which is opacified. There is atherosclerotic calcification of the major vessels at the base of the brain. IMPRESSION: No acute finding by CT. Advanced generalized brain atrophy and chronic small vessel ischemic changes. These results were called by telephone at the time of interpretation on 06/23/2015 at 6:54 pm to Dr. Gareth Morgan , who verbally acknowledged these results. Electronically Signed   By: Nelson Chimes M.D.   On: 07/06/2015 18:57   Ct Angio Neck W/cm &/or Wo/cm  07/15/2015  CLINICAL DATA:  75 year old male with widespread superior hemisphere restricted diffusion on brain MRI yesterday possibly related to emboli or severe posterior reversible encephalopathy syndrome. Initial encounter. EXAM: CT ANGIOGRAPHY HEAD AND NECK TECHNIQUE: Multidetector CT imaging of the head and neck was performed using the standard protocol during bolus administration of intravenous contrast. Multiplanar CT image reconstructions and MIPs were obtained to evaluate the vascular anatomy. Carotid stenosis  measurements (when applicable) are obtained utilizing NASCET criteria, using the distal internal carotid diameter as the denominator. CONTRAST:  50 mL Isovue 370 COMPARISON:  Brain MRI 07/14/2015. Head CT 07/13/2015. Intracranial MRA 02/01/2014 cervical spine CT 04/09/2012. FINDINGS: CT HEAD Brain: Abnormal cortical hypodensity in the bilateral superior frontal gyri, and to a lesser extent parietal lobes, corresponding to the abnormal diffusion. No associated hemorrhage or mass effect. Stable underlying cerebral volume. Stable gray-white matter differentiation elsewhere since 07/13/2015. No ventriculomegaly. Calvarium and skull base: Stable, negative. Paranasal sinuses: Stable left maxillary sinus opacification. Orbits: Stable, negative. CTA NECK Skeleton: Osteopenia. Exaggerated cervical lordosis with congenital appearing bilateral C6 lamina defects. Mild C6 compression vertebral compression. The appearance is unchanged from 2014. No acute osseous abnormality identified. Other neck: Intubated and oral enteric tube in place. Endotracheal tube tip terminates above the carina. Enteric tube courses within the thoracic esophagus. Moderate apical pulmonary scarring. Areas of centrilobular emphysema in the right upper lobe. No superior mediastinal lymphadenopathy. Diminutive or absent thyroid. Negative parapharyngeal and retropharyngeal spaces. Negative sublingual space. Atrophied or surgically absent submandibular glands. There is also degree of bilateral parotid atrophy. No cervical lymphadenopathy. Aortic arch: 3 vessel arch configuration. Moderate soft and calcified arch atherosclerosis. Right carotid system: No brachiocephalic artery stenosis despite soft plaque. No right CCA origin stenosis. Mildly tortuous right CCA. Proximal to the right carotid bifurcation there is medial soft plaque in the right CCA resulting an less than 50 % stenosis with respect to the distal vessel. This appears to be mildly ulcerated  (series 501, image 70). At the right carotid bifurcation there is soft and calcified plaque but no proximal right ICA stenosis. Soft and calcified plaque distal  to the right ICA bulb without stenosis. The vessel remains patent to the skullbase. Left carotid system: No left CCA origin stenosis. Soft plaque proximal to the left carotid bifurcation without stenosis. Soft and calcified plaque at the bifurcation without proximal left ICA stenosis. The left ICA is patent to the skullbase. Vertebral arteries: No proximal right subclavian artery stenosis despite soft and calcified plaque. No definite stenosis at the right vertebral artery origin. Patent right vertebral artery to the skullbase without stenosis. Bulky soft plaque in the proximal left subclavian artery resulting an stenosis up to 65-70 % with respect to the distal vessel (series 501, image 27). The left subclavian remains patent. No stenosis at the left vertebral artery origin. Codominant vertebral arteries. No left vertebral artery stenosis to the skullbase. CTA HEAD Posterior circulation: Codominant distal vertebral arteries are patent without stenosis. Patent vertebrobasilar junction. Normal right PICA origin. Dominant appearing left AICA. Tortuous basilar artery without stenosis. Normal SCA and PCA origins. Normal bilateral PCA branches. Anterior circulation: Both ICA siphons are patent. There is soft plaque suspected in much of the right petrous segment without hemodynamically significant stenosis. Mild calcified plaque. Mild cavernous segment tortuosity. No siphon stenosis. Normal ophthalmic artery origins. Patent carotid termini. Normal MCA origins. Normal ACA origins. The left A1 is dominant. Anterior communicating artery and visualized bilateral ACA branches are within normal limits. Left MCA M1 segment, bifurcation, and left MCA branches are within normal limits. Right MCA M1 segment, bifurcation, and right MCA branches are within normal limits.  Venous sinuses: Patent. Anatomic variants: None. Delayed phase: No abnormal enhancement identified. IMPRESSION: 1. Negative for emergent large vessel occlusion. Widespread atherosclerotic plaque, but no hemodynamically significant carotid, vertebral, or intracranial artery stenosis. 2. 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 3. Expected CT appearance of the superior hemispheric diffusion positive lesions seen by MRI yesterday. No associated hemorrhage or mass effect. 4. No acute findings in the neck. Endotracheal tube terminates above the carina, enteric tube courses within the thoracic esophagus. Electronically Signed   By: Genevie Ann M.D.   On: 07/15/2015 11:36   Mr Brain Wo Contrast  07/14/2015  CLINICAL DATA:  Hypertension and confusion, acute onset. EXAM: MRI HEAD WITHOUT CONTRAST TECHNIQUE: Multiplanar, multiecho pulse sequences of the brain and surrounding structures were obtained without intravenous contrast. COMPARISON:  Head CT 07/13/2015. MRI 07/08/2015. Multiple previous neuro imaging studies. FINDINGS: There are multiple newly seen areas of brain cortical and subcortical edema with mild swelling associated largely with restricted diffusion. These are most prominent along the surface of the brain running from front back at the vertex, right more than left. Chronic changes of brain atrophy an old small vessel insults within the brainstem, cerebellum and cerebral hemispheric white matter are again noted. Chronic abnormality of the corpus callosum is unchanged. I do not see any evidence of hemorrhage. No mass effect. No hydrocephalus. No extra-axial collection. No pituitary mass. There are inflammatory changes affecting the left maxillary sinus. IMPRESSION: Newly seen extensive areas of restricted diffusion and brain edema in a patchy pattern primarily at the vertices, right more than left. Differential diagnosis is that of an atypical manifestation of posterior  reversible encephalopathy/hypertensive encephalopathy versus is embolic infarctions. Due to the history of multiple presentations in the past during episodes of severe hypertension, posterior reversible encephalopathy is favored. Electronically Signed   By: Nelson Chimes M.D.   On: 07/14/2015 19:18   Mr Brain Wo Contrast  07/08/2015  CLINICAL DATA:  Aphasia and change  in mental status. Symptoms began and yesterday. The patient then became unresponsive and was intubated. EXAM: MRI HEAD WITHOUT CONTRAST TECHNIQUE: Multiplanar, multiecho pulse sequences of the brain and surrounding structures were obtained without intravenous contrast. COMPARISON:  CT head without contrast 07/08/2015. MRI brain 01/11/2015. FINDINGS: Advanced atrophy and diffuse white matter changes are evident bilaterally. There is diffuse white matter change within both the splenium and the anterior genu of the corpus callosum. Diffuse atrophy of the corpus callosum is noted. These findings are similar the prior study. Increased T2 signal is present within these cerebellar peduncle is bilaterally. Focal susceptibility within the dentate nuclei is stable. There is significant T2 shine through on the diffusion weighted images, similar to the prior exams. No focal restricted diffusion is present on the ADC map sub to suggest acute or subacute infarction. No acute hemorrhage or mass lesion is present. The ventricles are proportionate to the degree of atrophy and stable since the prior study. No significant extra-axial fluid collection is present. Flow is present in the major intracranial arteries. The globes and orbits are intact. Chronic left maxillary sinus disease is present with near complete opacification of the left maxillary sinus. Asymmetric opacification of ethmoid air cells is noted on the left. Mild mucosal thickening is present in the sphenoid sinuses and frontal sinuses. There is some fluid in the mastoid air cells bilaterally without an  obstructing nasopharyngeal lesion. Midline sagittal images demonstrate no focal lesions. Moderate degenerative changes are noted in the upper cervical spine, similar to the prior study. IMPRESSION: 1. No acute intracranial abnormality or significant interval change. 2. Advanced atrophy and diffuse white matter disease bilaterally. Extensive subcortical white matter changes bilaterally are likely related to the remote ischemic disease. These findings are stable. 3. 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 Electronically Signed   By: San Morelle M.D.   On: 07/08/2015 17:05   Dg Chest Port 1 View  07/21/2015  CLINICAL DATA:  Acute onset shortness of breath earlier today EXAM: PORTABLE CHEST 1 VIEW COMPARISON:  July 17, 2015 FINDINGS: There is persistent patchy opacity in the left base, likely atelectasis with minimal left effusion. Lungs elsewhere clear. Heart size and pulmonary vascularity are normal. No adenopathy. Nasogastric tube tip and side port are in the stomach. Patient has had to foramen ovale closure with closure device apparent. IMPRESSION: Probable atelectasis in the left base. A small focus of left base pneumonia is possible, however. No new opacity is apparent in this area compared to recent prior study. Lungs elsewhere clear. Stable cardiac silhouette. Nasogastric tube tip and side port are in the stomach. Electronically Signed   By: Lowella Grip III M.D.   On: 07/21/2015 08:16   Dg Chest Port 1 View  07/17/2015  CLINICAL DATA:  Patient with acute respiratory failure. EXAM: PORTABLE CHEST 1 VIEW COMPARISON:  Chest radiograph 07/15/2015. FINDINGS: Patient is rotated to the left. Interval extubation and removal of enteric tube. Stable enlarged cardiac and mediastinal contours. Small left pleural effusion. Underlying airspace opacities favored to represent atelectasis. Cardiac septal occluder device. IMPRESSION: Interval extubation and removal of enteric tube. Small left  pleural effusion and underlying opacities favored to represent atelectasis, stable. Electronically Signed   By: Lovey Newcomer M.D.   On: 07/17/2015 07:47   Dg Chest Port 1 View  07/15/2015  CLINICAL DATA:  Ventilator patient.  Acute respiratory failure. EXAM: PORTABLE CHEST 1 VIEW COMPARISON:  07/14/2015 and 07/13/2015. FINDINGS: 0721 hours. The endotracheal and nasogastric tubes appear unchanged. The  heart size and mediastinal contours are stable. Cardiac septal defect occluder device noted. The overall pulmonary aeration has improved. There is residual patchy left lower lobe airspace disease. No significant pleural effusion or pneumothorax. IMPRESSION: Overall improving aeration of the lungs with residual probable left lower lobe atelectasis. Electronically Signed   By: Richardean Sale M.D.   On: 07/15/2015 07:37   Dg Chest Port 1 View  07/14/2015  CLINICAL DATA:  Acute respiratory failure EXAM: PORTABLE CHEST 1 VIEW COMPARISON:  07/13/15 FINDINGS: Endotracheal tube and nasogastric catheter are again identified and stable in appearance. Patchy changes are again noted in the bases bilaterally left greater than right. No new focal abnormality is seen. IMPRESSION: Stable appearance when compared with the prior exam. Electronically Signed   By: Inez Catalina M.D.   On: 07/14/2015 07:38   Dg Chest Port 1 View  07/13/2015  CLINICAL DATA:  Check endotracheal tube placement EXAM: PORTABLE CHEST 1 VIEW COMPARISON:  07/11/2015 FINDINGS: Cardiac shadow is within normal limits. The lungs are well aerated bilaterally. Patchy infiltrates are again seen particularly in the left mid lung and left base. Some suggestion of early infiltrate is noted in the right medial lung base. A nasogastric catheter is seen in the stomach. The endotracheal tube lies in the trachea approximately 4 cm above the carina. No bony abnormality is seen. IMPRESSION: Endotracheal tube and nasogastric catheter as described. Patchy infiltrative changes  left greater than right. Electronically Signed   By: Inez Catalina M.D.   On: 07/13/2015 18:40   Dg Chest Port 1 View  07/11/2015  CLINICAL DATA:  Hypertension, coronary artery disease EXAM: PORTABLE CHEST 1 VIEW COMPARISON:  07/09/2015 FINDINGS: Interval extubation. RIGHT central venous line remains. Normal cardiac silhouette. There is LEFT lower lobe airspace disease not changed. No pneumothorax. IMPRESSION: 1. Interval extubation without complication. 2. Persistent LEFT lower lobe airspace opacity. Electronically Signed   By: Suzy Bouchard M.D.   On: 07/11/2015 09:04   Dg Chest Port 1 View  07/09/2015  CLINICAL DATA:  Aspiration pneumonia, intubated patient, history of TIA. EXAM: PORTABLE CHEST 1 VIEW COMPARISON:  Portable chest x-ray of July 08, 2015. FINDINGS: The right lung is adequately inflated and clear. On the left there is persistent retrocardiac and lower lobe density slightly more conspicuous today. The cardiac silhouette is normal in size. The pulmonary vascularity is not engorged. The endotracheal tube tip lies approximately 3.9 cm above the carina. The esophagogastric tube tip projects below the inferior margin of the image. The right internal jugular venous catheter tip projects over the midportion of the SVC. IMPRESSION: Slight interval increase in left lower lobe parenchymal density consistent with aspiration pneumonia. The right lung is clear. There is no pulmonary edema. The support tubes are in reasonable position. Electronically Signed   By: David  Martinique M.D.   On: 07/09/2015 07:27   Dg Chest Port 1 View  07/08/2015  CLINICAL DATA:  Central line placement. EXAM: PORTABLE CHEST 1 VIEW COMPARISON:  Earlier film, same date. FINDINGS: The right IJ center venous catheter tip is in the distal SVC near the cavoatrial junction. The endotracheal tube and NG tubes are stable. The heart and lungs are relatively stable. Slight improved aeration in the left lower lobe. IMPRESSION: Right IJ  center venous catheter tip is in the distal SVC near the cavoatrial junction. No complicating features. The other support apparatus is stable. Slight improved left lower lobe aeration. Electronically Signed   By: Marijo Sanes M.D.   On:  07/08/2015 10:51   Dg Chest Portable 1 View  07/08/2015  CLINICAL DATA:  Post intubation EXAM: PORTABLE CHEST 1 VIEW COMPARISON:  07/16/2015 FINDINGS: Interval placement of an endotracheal tube with tip measuring 3.6 cm above the carina. Enteric tube tip is not visualized but is below the left hemidiaphragm, likely in the distal stomach. Borderline heart size with normal pulmonary vascularity. Focal airspace consolidation in the left mid lung as before. Developing consolidation in the right upper lung. No blunting of costophrenic angles. No pneumothorax. Calcification of the aorta. IMPRESSION: Appliances appear in satisfactory position. Focal consolidation in the left mid lung with developing consolidation in the right upper lung. Electronically Signed   By: Lucienne Capers M.D.   On: 07/08/2015 01:12   Dg Chest Port 1 View  07/18/2015  CLINICAL DATA:  Pneumonia.  Shortness of breath. EXAM: PORTABLE CHEST 1 VIEW COMPARISON:  Earlier same day.  04/15/2015. FINDINGS: Compared to the study of only 4 hours ago, there is rapidly worsening infiltrate in the left lower lobe. This raises the possibility of aspiration. Right chest appears clear in this projection. Presumed Watchman device projects over the heart. IMPRESSION: Development of rapid airspace filling in the left lower lobe consistent with pneumonia. Rapid development suggests possible aspiration. Electronically Signed   By: Nelson Chimes M.D.   On: 07/10/2015 23:49   Dg Chest Portable 1 View  06/25/2015  CLINICAL DATA:  Code stroke.  Altered mental status. EXAM: PORTABLE CHEST 1 VIEW COMPARISON:  04/15/2015 FINDINGS: The patient has taken a poor inspiration. The heart is at the upper limits of normal in size. There is  calcification of the thoracic aorta. Accentuated markings at the lung bases could relate to the poor inspiration. Mild basilar pneumonia not excluded on this portable exam. The upper lungs are clear. No evidence of heart failure or effusion. IMPRESSION: Poor inspiration. Prominent markings at the bases that could relate to the poor inspiration or could be secondary to basilar pneumonia. Electronically Signed   By: Nelson Chimes M.D.   On: 07/11/2015 19:45   Dg Abd Portable 1v  07/18/2015  CLINICAL DATA:  Tube placement EXAM: PORTABLE ABDOMEN - 1 VIEW COMPARISON:  07/17/2015 FINDINGS: The enteric tube extends into the stomach with tip in the region of the gastric fundus. IMPRESSION: Enteric tube extends into the stomach. Electronically Signed   By: Andreas Newport M.D.   On: 07/18/2015 23:28   Dg Swallowing Func-speech Pathology  07/12/2015  Objective Swallowing Evaluation: Type of Study: MBS-Modified Barium Swallow Study Patient Details Name: STIVEN REISENAUER MRN: [id number] Date of Birth: 1940-12-25 Today's Date: 07/12/2015 Time: SLP Start Time (ACUTE ONLY): 1254-SLP Stop Time (ACUTE ONLY): 1307 SLP Time Calculation (min) (ACUTE ONLY): 13 min Past Medical History: Past Medical History Diagnosis Date . Hx of transient ischemic attack (TIA)  . Hyperlipidemia  . ED (erectile dysfunction)  . Personal history of kidney stones  . Subarachnoid hemorrhage ([place])    '11- hospitalized -3 to 4 days(passed out-awoke in hospital)- no residual affects. . Myocardial infarction ([place])    12/1991 . Dysphagia  . Hypokalemia  . Hypothyroidism  . Sleep apnea    on CPAP-settings 6 . History of kidney stones 02-27-13   x5- none recent . Throat cancer [facility])    '00-radiation therapy . Stroke ([place])  . Unspecified cerebral artery occlusion with cerebral infarction 11/08/2012 . Episodic confusion 02-27-13   hx. of, not currently(multiple episodes- ? TIA's)-always saw MD to be evaluated. Marland Kitchen AKI (acute kidney injury) (  [place]) 02/23/2011   pt.  denies any problems 02-27-13 . Hypertension    hx. labile - with spikes every 3-6 mos.-last 8'14 ED visit. Marland Kitchen Hypertensive encephalopathy  . Headache(784.0)    episodic with periods of confusion -multiple . Neuromuscular disorder ([place])    "parkinson's" -no tremor-sees neurology MD at [facility] . Coronary artery disease    Dr. Martinique Past Surgical History: Past Surgical History Procedure Laterality Date . Angioplasty  1993   LAD . Cardiac catheterization   . Coronary angioplasty   . Basal cell cancer     head . Hernia repair   . Appendectomy   . Patent foramen ovale closure  2008 . Eye surgery   . Colonoscopy N/A 06/18/2012   Procedure: COLONOSCOPY;  Surgeon: Jeryl Columbia, MD;  Location: [facility] ENDOSCOPY;  Service: Endoscopy;  Laterality: N/A;  h&p in file-[place]  . Hot hemostasis N/A 06/18/2012   Procedure: HOT HEMOSTASIS (ARGON PLASMA COAGULATION/BICAP);  Surgeon: Jeryl Columbia, MD;  Location: [facility] ENDOSCOPY;  Service: Endoscopy;  Laterality: N/A; . Colonoscopy with propofol N/A 03/07/2013   Procedure: COLONOSCOPY WITH PROPOFOL;  Surgeon: Jeryl Columbia, MD;  Location: [facility] ENDOSCOPY;  Service: Endoscopy;  Laterality: N/A; HPI: 75 y.o. male with PMH as outlined below including uncontrolled hypertension for which he has had multiple admissions. He was brought to [facility] ED 04/19 evening by his wife who informed staff that he was having a stroke due to the fact that he was unable to follow commands and had gargled speech. He has hx of hemorrhagic stroke and apparently presented very similarly at the time. He was last seen normal around 1pm earlier that afternoon. Wife stated that he never had any focal deficits, only gargled speech and inability follow commands.  Subjective: pt alert, pleasant, HOH, says he coughs all the time when eating and drinking Assessment / Plan / Recommendation CHL IP CLINICAL IMPRESSIONS 07/12/2015 Therapy Diagnosis Mild oral phase dysphagia;Severe pharyngeal phase dysphagia Clinical Impression Pt has a mild  oral and severe pharyngeal dysphagia, which appears only mildly exacerbated from baseline swallowing function when compared to prior MBS studies. Oral phase is discoordinated with poor bolus cohesion and prolonged transit time, but ultimately with good clearance. He has a delay in swallow trigger with all consistencies reaching the pyriform sinuses, which results in aspiration of honey thick liquids and purees. Aspiration is intermittently sensed, with delayed and inconsistent coughing not entirely effective at clearing aspirates. With Max cues for a chin tuck, he is able to protect his airway with purees and Dys 2 textures. Mild residue remains in the valleculae, although it is not significantly changed with more solid boluses. Recommend Dys 2 diet and pudding thick liquids with full supervision for use of chin tuck. Impact on safety and function Moderate aspiration risk;Severe aspiration risk   CHL IP TREATMENT RECOMMENDATION 07/12/2015 Treatment Recommendations Therapy as outlined in treatment plan below   Prognosis 07/12/2015 Prognosis for Safe Diet Advancement Good Barriers to Reach Goals Cognitive deficits;Severity of deficits;Time post onset Barriers/Prognosis Comment -- CHL IP DIET RECOMMENDATION 07/12/2015 SLP Diet Recommendations Dysphagia 2 (Fine chop) solids;Pudding thick liquid Liquid Administration via Spoon Medication Administration Crushed with puree Compensations Minimize environmental distractions;Slow rate;Small sips/bites;Chin tuck Postural Changes Remain semi-upright after after feeds/meals (Comment);Seated upright at 90 degrees   CHL IP OTHER RECOMMENDATIONS 07/12/2015 Recommended Consults -- Oral Care Recommendations Oral care BID Other Recommendations Order thickener from pharmacy;Prohibited food (jello, ice cream, thin soups);Remove water pitcher   CHL IP FOLLOW UP RECOMMENDATIONS 07/12/2015  Follow up Recommendations (No Data)   CHL IP FREQUENCY AND DURATION 07/12/2015 Speech Therapy Frequency  (ACUTE ONLY) min 2x/week Treatment Duration 2 weeks      CHL IP ORAL PHASE 07/12/2015 Oral Phase Impaired Oral - Pudding Teaspoon -- Oral - Pudding Cup -- Oral - Honey Teaspoon Decreased bolus cohesion;Delayed oral transit Oral - Honey Cup -- Oral - Nectar Teaspoon -- Oral - Nectar Cup -- Oral - Nectar Straw -- Oral - Thin Teaspoon -- Oral - Thin Cup -- Oral - Thin Straw -- Oral - Puree Decreased bolus cohesion;Delayed oral transit Oral - Mech Soft Decreased bolus cohesion;Delayed oral transit Oral - Regular -- Oral - Multi-Consistency -- Oral - Pill -- Oral Phase - Comment --  CHL IP PHARYNGEAL PHASE 07/12/2015 Pharyngeal Phase Impaired Pharyngeal- Pudding Teaspoon -- Pharyngeal -- Pharyngeal- Pudding Cup -- Pharyngeal -- Pharyngeal- Honey Teaspoon Delayed swallow initiation-pyriform sinuses;Reduced anterior laryngeal mobility;Reduced laryngeal elevation;Reduced tongue base retraction;Penetration/Aspiration before swallow;Pharyngeal residue - valleculae;Compensatory strategies attempted (with notebox) Pharyngeal Material enters airway, passes BELOW cords without attempt by patient to eject out (silent aspiration) Pharyngeal- Honey Cup -- Pharyngeal -- Pharyngeal- Nectar Teaspoon -- Pharyngeal -- Pharyngeal- Nectar Cup -- Pharyngeal -- Pharyngeal- Nectar Straw -- Pharyngeal -- Pharyngeal- Thin Teaspoon -- Pharyngeal -- Pharyngeal- Thin Cup -- Pharyngeal -- Pharyngeal- Thin Straw -- Pharyngeal -- Pharyngeal- Puree Delayed swallow initiation-pyriform sinuses;Reduced anterior laryngeal mobility;Reduced laryngeal elevation;Reduced tongue base retraction;Penetration/Aspiration before swallow;Pharyngeal residue - valleculae;Compensatory strategies attempted (with notebox) Pharyngeal Material enters airway, passes BELOW cords without attempt by patient to eject out (silent aspiration) Pharyngeal- Mechanical Soft Delayed swallow initiation-pyriform sinuses;Reduced anterior laryngeal mobility;Reduced laryngeal  elevation;Reduced tongue base retraction;Pharyngeal residue - valleculae;Compensatory strategies attempted (with notebox) Pharyngeal Material does not enter airway Pharyngeal- Regular -- Pharyngeal -- Pharyngeal- Multi-consistency -- Pharyngeal -- Pharyngeal- Pill -- Pharyngeal -- Pharyngeal Comment --  CHL IP CERVICAL ESOPHAGEAL PHASE 07/12/2015 Cervical Esophageal Phase Impaired Pudding Teaspoon -- Pudding Cup -- Honey Teaspoon Esophageal backflow into cervical esophagus Honey Cup -- Nectar Teaspoon -- Nectar Cup -- Nectar Straw -- Thin Teaspoon -- Thin Cup -- Thin Straw -- Puree Esophageal backflow into cervical esophagus Mechanical Soft Esophageal backflow into cervical esophagus Regular -- Multi-consistency -- Pill -- Cervical Esophageal Comment -- No flowsheet data found. [name], M.A. CCC-SLP [phone] [name] 07/12/2015, 1:44 PM               Time Spent in minutes  35   Louellen Molder M.D on 07/22/2015 at 1:59 PM  Between 7am to 7pm - Pager - [phone]  After 7pm go to [email or web address] - password [facility]  [facility] -  Office  [phone]

## 2015-07-22 NOTE — Clinical Social Work Note (Signed)
CSW met with patient. Wife and friends at bedside. CSW spoke with wife to confirm that they did not want go to a SNF. Patient's wife said that patient's condition is declining daily. If he starts to improve, she is willing for him to go to a SNF but if he continues to decline, she would prefer for him to have home hospice. CSW provided contact information for patient's wife and encouraged her to contact CSW as needed. CSW also provided RNCM's name in the event that they need home hospice.  Benjamin Park, [name]

## 2015-07-23 DIAGNOSIS — R0600 Dyspnea, unspecified: Secondary | ICD-10-CM

## 2015-07-23 DIAGNOSIS — E876 Hypokalemia: Secondary | ICD-10-CM

## 2015-07-23 LAB — BASIC METABOLIC PANEL
ANION GAP: 11 (ref 5–15)
BUN: 33 mg/dL — ABNORMAL HIGH (ref 6–20)
CALCIUM: 9 mg/dL (ref 8.9–10.3)
CO2: 28 mmol/L (ref 22–32)
Chloride: 116 mmol/L — ABNORMAL HIGH (ref 101–111)
Creatinine, Ser: 1.21 mg/dL (ref 0.61–1.24)
GFR, EST NON AFRICAN AMERICAN: 57 mL/min — AB (ref >60)
Glucose, Bld: 119 mg/dL — ABNORMAL HIGH (ref 65–99)
Potassium: 3.1 mmol/L — ABNORMAL LOW (ref 3.5–5.1)
Sodium: 155 mmol/L — ABNORMAL HIGH (ref 135–145)

## 2015-07-23 LAB — CBC
HCT: 41 % (ref 39.0–52.0)
HEMOGLOBIN: 12.9 g/dL — AB (ref 13.0–17.0)
MCH: 31.9 pg (ref 26.0–34.0)
MCHC: 31.5 g/dL (ref 30.0–36.0)
MCV: 101.5 fL — ABNORMAL HIGH (ref 78.0–100.0)
Platelets: 247 10*3/uL (ref 150–400)
RBC: 4.04 MIL/uL — AB (ref 4.22–5.81)
RDW: 13.9 % (ref 11.5–15.5)
WBC: 14.8 10*3/uL — ABNORMAL HIGH (ref 4.0–10.5)

## 2015-07-23 LAB — GLUCOSE, CAPILLARY
GLUCOSE-CAPILLARY: 118 mg/dL — AB (ref 65–99)
GLUCOSE-CAPILLARY: 131 mg/dL — AB (ref 65–99)
GLUCOSE-CAPILLARY: 140 mg/dL — AB (ref 65–99)
Glucose-Capillary: 125 mg/dL — ABNORMAL HIGH (ref 65–99)
Glucose-Capillary: 117 mg/dL — ABNORMAL HIGH (ref 65–99)
Glucose-Capillary: 149 mg/dL — ABNORMAL HIGH (ref 65–99)

## 2015-07-23 MED ORDER — POTASSIUM CHLORIDE 10 MEQ/100ML IV SOLN
10.0000 meq | INTRAVENOUS | Status: AC
  Administered 2015-07-23 (×3): 10 meq via INTRAVENOUS
  Filled 2015-07-23 (×3): qty 100

## 2015-07-23 MED ORDER — POTASSIUM CHLORIDE 2 MEQ/ML IV SOLN
INTRAVENOUS | Status: DC
  Administered 2015-07-23 – 2015-07-24 (×2): via INTRAVENOUS
  Filled 2015-07-23 (×3): qty 1000

## 2015-07-23 NOTE — Care Management Important Message (Signed)
Important Message  Patient Details  Name: Benjamin Park MRN: [id number] Date of Birth: [birth year]   Medicare Important Message Given:  Yes    [name] Abena 07/23/2015, 3:05 PM

## 2015-07-23 NOTE — Progress Notes (Signed)
PROGRESS NOTE                                                                                                                                                                                                             Patient Demographics:    Benjamin Park, is a 75 y.o. male, DOB - 1940/08/16, DW:[id number]  Admit date - 07/09/2015   Admitting Physician Rush Farmer, MD  Outpatient Primary MD for the patient is No primary care provider on file.  LOS - 16  Outpatient Specialists:   No chief complaint on file.      Brief Narrative   75 year old male with history of uncontrolled hypertension, history of TIAs and stroke, frequent falls, sleep apnea, history of subarachnoid hemorrhage, MI with angioplasty, dysphagia, hypothyroidism, neuromuscular disorder, hyperlipidemia was brought to the hospital on 4/19 after he had slurred speech and unable to follow commands. He had history of hemorrhagic stroke previously with similar presentation. In the ED he had elevated blood pressure in the low 200s and was started on a new nicardipine drip but rate showed his blood pressure dropped into low 100s. Head CT was unremarkable for acute infarct or hemorrhage. Neurology evaluated the patient in the ED and while he was then he had acute change in mental status and became minimally responsive. Chest x-ray showed new left lower lobe infiltrate concerning for aspiration.  he was admitted to the ICU intubated. He was then transferred to medical floor on 4/21 after being successfully extubated. Started to improve and was planned for discharge on 4/25 but desaturated again with concern for repeat aspiration, became lethargic and blood pressure again elevated to [id number] systolic. Patient was again intubated for airway protection and transferred to ICU. Patient was then found to have left-sided hemiparesis. MRI was done which  upon review by stroke team  suggested to have bilateral frontal MCA, ACA infarct of embolic pattern. EEG done showed triphasic wave without seizure activity. Patient was extubated on 4/29. Patient continues to have left-sided weakness and failed swallow. Transferred to hospitalist service on 5/1.  On 5/3 patient went into respiratory distress and hypertensive. Chest x-ray showed atelectasis. Possibly had a mucous plug and aspiration. Required NRB and Transfer to stepdown unit.     Subjective:      Assessment  & Plan :   Principal problem Acute  hypoxic respiratory failure Secondary to recurrent aspiration pneumonia. Required Intubation twice during hospital course. Completed antibiotic course on 4/28. Given recurrent aspiration on 5/3 placed him on Unasyn.  failed swallow evaluation recurrently.Started him on NG feeds on 5/2, held due to aspiration and respiratory distress. -Patient went into respiratory distress again on 5/3, Likely due to aspiration and mucous plug.  -Monitored in stepdown. Still requiring facemask. Will attempt transitioned to nasal cannula today.    Active Problems: Acute encephalopathy Likely a combination of aspiration pneumonia, malignant hypertension and CVA -Needs Aggressive blood pressure control.  appears that his mental status deteriorates every time his blood pressure increases. MRI suggests pt might have PRES but neurology thinks he has stroke. -on when necessary IV hydralazine scheduled IV metoprolol. -Mental status slightly better today. Will attempt to transition to nasal cannula and if tolerated start gentle NG feeds.  Acute bilateral frontal embolic stroke Source unclear. 2-D echo with normal EF and no cardiac source of emboli. Lower extremity Doppler negative for DVT. On full dose aspirin and statin (held as patient nothing by mouth) ay Holter monitoring upon discharge.  Uncontrolled hypertension with hypertensive emergency As above. scheduled metoprolol and when necessary  hydralazine.  Hypothyroidism On Synthroid. (Held as patient is nothing by mouth.)  Severe dysphagia Continues to feel swallow evaluation. Holding NG feeds due to encephalopathy and ongoing aspiration  hypernatremia Continue D5. Add free water once NG feeds started.  Hypokalemia Replenish with IV KCl   Goals of care Poor Overall prognosis. Palliative care following. Wife wants to see for another day for any possible improvement. She understands that he is going to have recurrent aspiration and repeatedly failed swallow. Given his respiratory status is not stable for PEG tube feeding at this time. Palliative care followed again today and per the discussion wife is planning to make a decision on residential versus home hospice. I have made her DO NOT RESUSCITATE/DO NOT INTUBATE and wife agrees.   Code StatusDO NOT RESUSCITATE Family Communication  : Wife at bedside  Disposition Plan  :  Port improvement in symptoms with guarded prognosis. May need home versus agents and hospice   Barriers For Discharge :  Active symptoms requiring step down monitoring.  Consults  :   PC CM Neurology Palliative care     DVT Prophylaxis  :  SQ heparin  Lab Results  Component Value Date   PLT 247 07/23/2015    CULTURES: Blood 04/20 > NG final Sputum 04/20 > Klebsiella pneumoniae, Pseudomonas (sensitivities reported)  ANTIBIOTICS: Zosyn 04/20 >>>4/28 Vanc 4/20>>>4/22, restarted 4/25>>4/27 Unasyn 5/3-   LINES/TUBES: ETT 04/20 >4/21, 4/25-29  Procedures Head CT MRI brain 2-D echo Doppler lower extremities EEG   Anti-infectives    Start     Dose/Rate Route Frequency Ordered Stop   07/21/15 1200  Ampicillin-Sulbactam (UNASYN) 3 g in sodium chloride 0.9 % 100 mL IVPB     3 g 100 mL/hr over 60 Minutes Intravenous Every 6 hours 07/21/15 1113     07/13/15 1800  vancomycin (VANCOCIN) IVPB 1000 mg/200 mL premix  Status:  Discontinued     1,000 mg 200 mL/hr over 60 Minutes  Intravenous Every 12 hours 07/13/15 1757 07/15/15 1040   07/08/15 1800  vancomycin (VANCOCIN) IVPB 750 mg/150 ml premix  Status:  Discontinued     750 mg 150 mL/hr over 60 Minutes Intravenous Every 12 hours 07/08/15 0050 07/09/15 0942   07/08/15 0800  piperacillin-tazobactam (ZOSYN) IVPB 3.375 g     3.375 g  12.5 mL/hr over 240 Minutes Intravenous Every 8 hours 07/08/15 0050 07/15/15 2359   07/08/15 0100  vancomycin (VANCOCIN) IVPB 1000 mg/200 mL premix     1,000 mg 200 mL/hr over 60 Minutes Intravenous  Once 07/08/15 0050 07/08/15 0809   07/08/15 0100  piperacillin-tazobactam (ZOSYN) IVPB 3.375 g  Status:  Discontinued     3.375 g 100 mL/hr over 30 Minutes Intravenous  Once 07/08/15 0050 07/16/15 0750   07/08/15 0000  metroNIDAZOLE (FLAGYL) IVPB 500 mg     500 mg 100 mL/hr over 60 Minutes Intravenous  Once 07/18/2015 2359 07/08/15 1900   07/08/2015 2015  cefTRIAXone (ROCEPHIN) 1 g in dextrose 5 % 50 mL IVPB     1 g 100 mL/hr over 30 Minutes Intravenous  Once 07/18/2015 2012 06/29/2015 2152   06/19/2015 2015  azithromycin (ZITHROMAX) 500 mg in dextrose 5 % 250 mL IVPB     500 mg 250 mL/hr over 60 Minutes Intravenous  Once 07/17/2015 2012 07/03/2015 2318        Objective:   Filed Vitals:   07/22/15 2306 07/23/15 0400 07/23/15 0800 07/23/15 1202  BP:  110/78 137/102 136/101  Pulse:  95 92 95  Temp: 99 F (37.2 C) 97.7 F (36.5 C) 97.7 F (36.5 C) 97.3 F (36.3 C)  TempSrc: Axillary Axillary Oral Oral  Resp:  16 17 19   Height:  6' (1.829 m)    Weight:  65.7 kg (144 lb 13.5 oz)    SpO2:  96% 96% 93%    Wt Readings from Last 3 Encounters:  07/23/15 65.7 kg (144 lb 13.5 oz)  04/15/15 68.539 kg (151 lb 1.6 oz)  03/13/15 75.6 kg (166 lb 10.7 oz)     Intake/Output Summary (Last 24 hours) at 07/23/15 1217 Last data filed at 07/23/15 1100  Gross per 24 hour  Intake   1910 ml  Output    600 ml  Net   1310 ml     Physical Exam  Gen: On facemask, more alert today  HEENT: NG in place,   dry mucosa, supple neck Chest: Diminished breath sounds bilaterally CVS: S1 and S2 normal, no murmurs rub or gallop GI: soft, NT, ND, BS+ Musculoskeletal: warm, no edema CNS: Awake, poorly communicative     Data Review:    CBC  Recent Labs Lab 07/18/15 0333 07/19/15 0407 07/21/15 0949 07/22/15 0351 07/23/15 0430  WBC 4.5 6.6 19.0* 15.2* 14.8*  HGB 14.1 14.6 15.6 14.1 12.9*  HCT 43.7 44.8 48.0 44.8 41.0  PLT 247 265 292 239 247  MCV 99.5 99.1 98.4 100.4* 101.5*  MCH 32.1 32.3 32.0 31.6 31.9  MCHC 32.3 32.6 32.5 31.5 31.5  RDW 13.3 13.4 13.5 13.9 13.9    Chemistries   Recent Labs Lab 07/19/15 0407 07/20/15 0617 07/21/15 0949 07/22/15 0351 07/23/15 0430  NA 148* 148* 147* 153* 155*  K 3.5 3.7 3.2* 3.8 3.1*  CL 110 112* 112* 116* 116*  CO2 26 28 24 24 28   GLUCOSE 112* 96 134* 114* 119*  BUN 40* 41* 29* 36* 33*  CREATININE 1.13 1.12 1.07 1.36* 1.21  CALCIUM 9.1 8.9 9.1 8.9 9.0   ------------------------------------------------------------------------------------------------------------------ No results for input(s): CHOL, HDL, LDLCALC, TRIG, CHOLHDL, LDLDIRECT in the last 72 hours.  Lab Results  Component Value Date   HGBA1C 5.6 07/16/2015   ------------------------------------------------------------------------------------------------------------------ No results for input(s): TSH, T4TOTAL, T3FREE, THYROIDAB in the last 72 hours.  Invalid input(s): FREET3 ------------------------------------------------------------------------------------------------------------------ No results for input(s): VITAMINB12, FOLATE, FERRITIN,  TIBC, IRON, RETICCTPCT in the last 72 hours.  Coagulation profile No results for input(s): INR, PROTIME in the last 168 hours.  No results for input(s): DDIMER in the last 72 hours.  Cardiac Enzymes  Recent Labs Lab 07/21/15 0949 07/21/15 1512 07/21/15 2225  TROPONINI <0.03 0.05* 0.05*    ------------------------------------------------------------------------------------------------------------------ No results found for: BNP  Inpatient Medications  Scheduled Meds: . ampicillin-sulbactam (UNASYN) IV  3 g Intravenous Q6H  . antiseptic oral rinse  7 mL Mouth Rinse q12n4p  . aspirin  325 mg Per Tube Daily  . chlorhexidine  15 mL Mouth Rinse BID  . feeding supplement (JEVITY 1.2 CAL)  1,000 mL Per Tube Q24H  . heparin  5,000 Units Subcutaneous Q8H  . hydrALAZINE  25 mg Oral Q8H  . insulin aspart  0-9 Units Subcutaneous Q4H  . ketorolac  15 mg Intravenous Once  . labetalol  100 mg Per Tube BID  . levothyroxine  112 mcg Per Tube QAC breakfast  . metoprolol  5 mg Intravenous Q6H  . pantoprazole (PROTONIX) IV  40 mg Intravenous Q24H  . potassium chloride  10 mEq Intravenous Q1 Hr x 3  . pravastatin  20 mg Per Tube q1800   Continuous Infusions: . dextrose 5 % 1,000 mL with potassium chloride 20 mEq infusion 75 mL/hr at 07/23/15 1100   PRN Meds:.sodium chloride, fentaNYL (SUBLIMAZE) injection, hydrALAZINE  Micro Results No results found for this or any previous visit (from the past 240 hour(s)).  Radiology Reports Ct Angio Head W/cm &/or Wo Cm  07/15/2015  CLINICAL DATA:  75 year old male with widespread superior hemisphere restricted diffusion on brain MRI yesterday possibly related to emboli or severe posterior reversible encephalopathy syndrome. Initial encounter. EXAM: CT ANGIOGRAPHY HEAD AND NECK TECHNIQUE: Multidetector CT imaging of the head and neck was performed using the standard protocol during bolus administration of intravenous contrast. Multiplanar CT image reconstructions and MIPs were obtained to evaluate the vascular anatomy. Carotid stenosis measurements (when applicable) are obtained utilizing NASCET criteria, using the distal internal carotid diameter as the denominator. CONTRAST:  50 mL Isovue 370 COMPARISON:  Brain MRI 07/14/2015. Head CT 07/13/2015.  Intracranial MRA 02/01/2014 cervical spine CT 04/09/2012. FINDINGS: CT HEAD Brain: Abnormal cortical hypodensity in the bilateral superior frontal gyri, and to a lesser extent parietal lobes, corresponding to the abnormal diffusion. No associated hemorrhage or mass effect. Stable underlying cerebral volume. Stable gray-white matter differentiation elsewhere since 07/13/2015. No ventriculomegaly. Calvarium and skull base: Stable, negative. Paranasal sinuses: Stable left maxillary sinus opacification. Orbits: Stable, negative. CTA NECK Skeleton: Osteopenia. Exaggerated cervical lordosis with congenital appearing bilateral C6 lamina defects. Mild C6 compression vertebral compression. The appearance is unchanged from 2014. No acute osseous abnormality identified. Other neck: Intubated and oral enteric tube in place. Endotracheal tube tip terminates above the carina. Enteric tube courses within the thoracic esophagus. Moderate apical pulmonary scarring. Areas of centrilobular emphysema in the right upper lobe. No superior mediastinal lymphadenopathy. Diminutive or absent thyroid. Negative parapharyngeal and retropharyngeal spaces. Negative sublingual space. Atrophied or surgically absent submandibular glands. There is also degree of bilateral parotid atrophy. No cervical lymphadenopathy. Aortic arch: 3 vessel arch configuration. Moderate soft and calcified arch atherosclerosis. Right carotid system: No brachiocephalic artery stenosis despite soft plaque. No right CCA origin stenosis. Mildly tortuous right CCA. Proximal to the right carotid bifurcation there is medial soft plaque in the right CCA resulting an less than 50 % stenosis with respect to the distal vessel. This appears to be mildly ulcerated (  series 501, image 70). At the right carotid bifurcation there is soft and calcified plaque but no proximal right ICA stenosis. Soft and calcified plaque distal to the right ICA bulb without stenosis. The vessel remains  patent to the skullbase. Left carotid system: No left CCA origin stenosis. Soft plaque proximal to the left carotid bifurcation without stenosis. Soft and calcified plaque at the bifurcation without proximal left ICA stenosis. The left ICA is patent to the skullbase. Vertebral arteries: No proximal right subclavian artery stenosis despite soft and calcified plaque. No definite stenosis at the right vertebral artery origin. Patent right vertebral artery to the skullbase without stenosis. Bulky soft plaque in the proximal left subclavian artery resulting an stenosis up to 65-70 % with respect to the distal vessel (series 501, image 27). The left subclavian remains patent. No stenosis at the left vertebral artery origin. Codominant vertebral arteries. No left vertebral artery stenosis to the skullbase. CTA HEAD Posterior circulation: Codominant distal vertebral arteries are patent without stenosis. Patent vertebrobasilar junction. Normal right PICA origin. Dominant appearing left AICA. Tortuous basilar artery without stenosis. Normal SCA and PCA origins. Normal bilateral PCA branches. Anterior circulation: Both ICA siphons are patent. There is soft plaque suspected in much of the right petrous segment without hemodynamically significant stenosis. Mild calcified plaque. Mild cavernous segment tortuosity. No siphon stenosis. Normal ophthalmic artery origins. Patent carotid termini. Normal MCA origins. Normal ACA origins. The left A1 is dominant. Anterior communicating artery and visualized bilateral ACA branches are within normal limits. Left MCA M1 segment, bifurcation, and left MCA branches are within normal limits. Right MCA M1 segment, bifurcation, and right MCA branches are within normal limits. Venous sinuses: Patent. Anatomic variants: None. Delayed phase: No abnormal enhancement identified. IMPRESSION: 1. Negative for emergent large vessel occlusion. Widespread atherosclerotic plaque, but no hemodynamically  significant carotid, vertebral, or intracranial artery stenosis. 2. 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 3. Expected CT appearance of the superior hemispheric diffusion positive lesions seen by MRI yesterday. No associated hemorrhage or mass effect. 4. No acute findings in the neck. Endotracheal tube terminates above the carina, enteric tube courses within the thoracic esophagus. Electronically Signed   By: Genevie Ann M.D.   On: 07/15/2015 11:36   Dg Abd 1 View  07/17/2015  CLINICAL DATA:  75 year old male status post NG tube placement. EXAM: ABDOMEN - 1 VIEW COMPARISON:  03/12/2015. FINDINGS: Nasogastric tube coiled in the proximal stomach. Gas is noted throughout the colon. Several nondilated gas-filled loops of small bowel are also noted. No gross evidence of pneumoperitoneum identified on this single supine view. IMPRESSION: 1. Tip of nasogastric tube is in the proximal stomach. Electronically Signed   By: Vinnie Langton M.D.   On: 07/17/2015 14:49   Ct Head Wo Contrast  07/13/2015  CLINICAL DATA:  Altered mental status EXAM: CT HEAD WITHOUT CONTRAST TECHNIQUE: Contiguous axial images were obtained from the base of the skull through the vertex without intravenous contrast. COMPARISON:  07/08/2015 FINDINGS: Bony calvarium is intact. Diffuse atrophic changes are noted as well as chronic white matter ischemic change. The overall appearance is similar to that seen on recent MRI examination. No acute hemorrhage, acute infarction or space-occupying mass lesion is seen IMPRESSION: Chronic atrophic and ischemic changes are noted. No acute abnormality seen. Electronically Signed   By: Inez Catalina M.D.   On: 07/13/2015 19:21   Ct Head Wo Contrast  07/08/2015  CLINICAL DATA:  Change in mental status. EXAM: CT  HEAD WITHOUT CONTRAST TECHNIQUE: Contiguous axial images were obtained from the base of the skull through the vertex without intravenous contrast. COMPARISON:  CT  5-1/2 hours prior.  Brain MRI 03/13/2015 FINDINGS: No change from prior exam. Advanced cerebral and cerebellar atrophy again seen. Advanced chronic small vessel ischemia. No evidence of developing infarct. No interval hemorrhage. Unchanged opacification of the left maxillary sinus, ethmoid air cell and frontal sinus to lesser extent. IMPRESSION: Advanced atrophy and chronic small vessel ischemia. No change from exam 5 hours prior. These results will be called to the ordering clinician or representative by the Radiologist Assistant, and communication documented in the PACS or zVision Dashboard. Electronically Signed   By: Jeb Levering M.D.   On: 07/08/2015 00:52   Ct Head Wo Contrast  07/08/2015  CLINICAL DATA:  Acute onset of confusion and aphasia.  Code stroke. EXAM: CT HEAD WITHOUT CONTRAST TECHNIQUE: Contiguous axial images were obtained from the base of the skull through the vertex without intravenous contrast. COMPARISON:  03/13/2015 FINDINGS: The brain shows advanced generalized atrophy. There are extensive chronic small vessel ischemic changes throughout the white matter. No identifiable acute infarction, mass lesion, hemorrhage, hydrocephalus or extra-axial collection. No calvarial abnormality. Sinuses are clear except for the left maxillary sinus which is opacified. There is atherosclerotic calcification of the major vessels at the base of the brain. IMPRESSION: No acute finding by CT. Advanced generalized brain atrophy and chronic small vessel ischemic changes. These results were called by telephone at the time of interpretation on 06/30/2015 at 6:54 pm to Dr. Gareth Morgan , who verbally acknowledged these results. Electronically Signed   By: Nelson Chimes M.D.   On: 07/09/2015 18:57   Ct Angio Neck W/cm &/or Wo/cm  07/15/2015  CLINICAL DATA:  75 year old male with widespread superior hemisphere restricted diffusion on brain MRI yesterday possibly related to emboli or severe posterior reversible  encephalopathy syndrome. Initial encounter. EXAM: CT ANGIOGRAPHY HEAD AND NECK TECHNIQUE: Multidetector CT imaging of the head and neck was performed using the standard protocol during bolus administration of intravenous contrast. Multiplanar CT image reconstructions and MIPs were obtained to evaluate the vascular anatomy. Carotid stenosis measurements (when applicable) are obtained utilizing NASCET criteria, using the distal internal carotid diameter as the denominator. CONTRAST:  50 mL Isovue 370 COMPARISON:  Brain MRI 07/14/2015. Head CT 07/13/2015. Intracranial MRA 02/01/2014 cervical spine CT 04/09/2012. FINDINGS: CT HEAD Brain: Abnormal cortical hypodensity in the bilateral superior frontal gyri, and to a lesser extent parietal lobes, corresponding to the abnormal diffusion. No associated hemorrhage or mass effect. Stable underlying cerebral volume. Stable gray-white matter differentiation elsewhere since 07/13/2015. No ventriculomegaly. Calvarium and skull base: Stable, negative. Paranasal sinuses: Stable left maxillary sinus opacification. Orbits: Stable, negative. CTA NECK Skeleton: Osteopenia. Exaggerated cervical lordosis with congenital appearing bilateral C6 lamina defects. Mild C6 compression vertebral compression. The appearance is unchanged from 2014. No acute osseous abnormality identified. Other neck: Intubated and oral enteric tube in place. Endotracheal tube tip terminates above the carina. Enteric tube courses within the thoracic esophagus. Moderate apical pulmonary scarring. Areas of centrilobular emphysema in the right upper lobe. No superior mediastinal lymphadenopathy. Diminutive or absent thyroid. Negative parapharyngeal and retropharyngeal spaces. Negative sublingual space. Atrophied or surgically absent submandibular glands. There is also degree of bilateral parotid atrophy. No cervical lymphadenopathy. Aortic arch: 3 vessel arch configuration. Moderate soft and calcified arch  atherosclerosis. Right carotid system: No brachiocephalic artery stenosis despite soft plaque. No right CCA origin stenosis. Mildly tortuous right CCA. Proximal  to the right carotid bifurcation there is medial soft plaque in the right CCA resulting an less than 50 % stenosis with respect to the distal vessel. This appears to be mildly ulcerated (series 501, image 70). At the right carotid bifurcation there is soft and calcified plaque but no proximal right ICA stenosis. Soft and calcified plaque distal to the right ICA bulb without stenosis. The vessel remains patent to the skullbase. Left carotid system: No left CCA origin stenosis. Soft plaque proximal to the left carotid bifurcation without stenosis. Soft and calcified plaque at the bifurcation without proximal left ICA stenosis. The left ICA is patent to the skullbase. Vertebral arteries: No proximal right subclavian artery stenosis despite soft and calcified plaque. No definite stenosis at the right vertebral artery origin. Patent right vertebral artery to the skullbase without stenosis. Bulky soft plaque in the proximal left subclavian artery resulting an stenosis up to 65-70 % with respect to the distal vessel (series 501, image 27). The left subclavian remains patent. No stenosis at the left vertebral artery origin. Codominant vertebral arteries. No left vertebral artery stenosis to the skullbase. CTA HEAD Posterior circulation: Codominant distal vertebral arteries are patent without stenosis. Patent vertebrobasilar junction. Normal right PICA origin. Dominant appearing left AICA. Tortuous basilar artery without stenosis. Normal SCA and PCA origins. Normal bilateral PCA branches. Anterior circulation: Both ICA siphons are patent. There is soft plaque suspected in much of the right petrous segment without hemodynamically significant stenosis. Mild calcified plaque. Mild cavernous segment tortuosity. No siphon stenosis. Normal ophthalmic artery origins. Patent  carotid termini. Normal MCA origins. Normal ACA origins. The left A1 is dominant. Anterior communicating artery and visualized bilateral ACA branches are within normal limits. Left MCA M1 segment, bifurcation, and left MCA branches are within normal limits. Right MCA M1 segment, bifurcation, and right MCA branches are within normal limits. Venous sinuses: Patent. Anatomic variants: None. Delayed phase: No abnormal enhancement identified. IMPRESSION: 1. Negative for emergent large vessel occlusion. Widespread atherosclerotic plaque, but no hemodynamically significant carotid, vertebral, or intracranial artery stenosis. 2. 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 3. Expected CT appearance of the superior hemispheric diffusion positive lesions seen by MRI yesterday. No associated hemorrhage or mass effect. 4. No acute findings in the neck. Endotracheal tube terminates above the carina, enteric tube courses within the thoracic esophagus. Electronically Signed   By: Genevie Ann M.D.   On: 07/15/2015 11:36   Mr Brain Wo Contrast  07/14/2015  CLINICAL DATA:  Hypertension and confusion, acute onset. EXAM: MRI HEAD WITHOUT CONTRAST TECHNIQUE: Multiplanar, multiecho pulse sequences of the brain and surrounding structures were obtained without intravenous contrast. COMPARISON:  Head CT 07/13/2015. MRI 07/08/2015. Multiple previous neuro imaging studies. FINDINGS: There are multiple newly seen areas of brain cortical and subcortical edema with mild swelling associated largely with restricted diffusion. These are most prominent along the surface of the brain running from front back at the vertex, right more than left. Chronic changes of brain atrophy an old small vessel insults within the brainstem, cerebellum and cerebral hemispheric white matter are again noted. Chronic abnormality of the corpus callosum is unchanged. I do not see any evidence of hemorrhage. No mass effect. No  hydrocephalus. No extra-axial collection. No pituitary mass. There are inflammatory changes affecting the left maxillary sinus. IMPRESSION: Newly seen extensive areas of restricted diffusion and brain edema in a patchy pattern primarily at the vertices, right more than left. Differential diagnosis is that of an atypical  manifestation of posterior reversible encephalopathy/hypertensive encephalopathy versus is embolic infarctions. Due to the history of multiple presentations in the past during episodes of severe hypertension, posterior reversible encephalopathy is favored. Electronically Signed   By: Nelson Chimes M.D.   On: 07/14/2015 19:18   Mr Brain Wo Contrast  07/08/2015  CLINICAL DATA:  Aphasia and change in mental status. Symptoms began and yesterday. The patient then became unresponsive and was intubated. EXAM: MRI HEAD WITHOUT CONTRAST TECHNIQUE: Multiplanar, multiecho pulse sequences of the brain and surrounding structures were obtained without intravenous contrast. COMPARISON:  CT head without contrast 07/08/2015. MRI brain 01/11/2015. FINDINGS: Advanced atrophy and diffuse white matter changes are evident bilaterally. There is diffuse white matter change within both the splenium and the anterior genu of the corpus callosum. Diffuse atrophy of the corpus callosum is noted. These findings are similar the prior study. Increased T2 signal is present within these cerebellar peduncle is bilaterally. Focal susceptibility within the dentate nuclei is stable. There is significant T2 shine through on the diffusion weighted images, similar to the prior exams. No focal restricted diffusion is present on the ADC map sub to suggest acute or subacute infarction. No acute hemorrhage or mass lesion is present. The ventricles are proportionate to the degree of atrophy and stable since the prior study. No significant extra-axial fluid collection is present. Flow is present in the major intracranial arteries. The globes  and orbits are intact. Chronic left maxillary sinus disease is present with near complete opacification of the left maxillary sinus. Asymmetric opacification of ethmoid air cells is noted on the left. Mild mucosal thickening is present in the sphenoid sinuses and frontal sinuses. There is some fluid in the mastoid air cells bilaterally without an obstructing nasopharyngeal lesion. Midline sagittal images demonstrate no focal lesions. Moderate degenerative changes are noted in the upper cervical spine, similar to the prior study. IMPRESSION: 1. No acute intracranial abnormality or significant interval change. 2. Advanced atrophy and diffuse white matter disease bilaterally. Extensive subcortical white matter changes bilaterally are likely related to the remote ischemic disease. These findings are stable. 3. 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 Electronically Signed   By: San Morelle M.D.   On: 07/08/2015 17:05   Dg Chest Port 1 View  07/21/2015  CLINICAL DATA:  Acute onset shortness of breath earlier today EXAM: PORTABLE CHEST 1 VIEW COMPARISON:  July 17, 2015 FINDINGS: There is persistent patchy opacity in the left base, likely atelectasis with minimal left effusion. Lungs elsewhere clear. Heart size and pulmonary vascularity are normal. No adenopathy. Nasogastric tube tip and side port are in the stomach. Patient has had to foramen ovale closure with closure device apparent. IMPRESSION: Probable atelectasis in the left base. A small focus of left base pneumonia is possible, however. No new opacity is apparent in this area compared to recent prior study. Lungs elsewhere clear. Stable cardiac silhouette. Nasogastric tube tip and side port are in the stomach. Electronically Signed   By: Lowella Grip III M.D.   On: 07/21/2015 08:16   Dg Chest Port 1 View  07/17/2015  CLINICAL DATA:  Patient with acute respiratory failure. EXAM: PORTABLE CHEST 1 VIEW COMPARISON:  Chest radiograph  07/15/2015. FINDINGS: Patient is rotated to the left. Interval extubation and removal of enteric tube. Stable enlarged cardiac and mediastinal contours. Small left pleural effusion. Underlying airspace opacities favored to represent atelectasis. Cardiac septal occluder device. IMPRESSION: Interval extubation and removal of enteric tube. Small left pleural effusion and underlying opacities  favored to represent atelectasis, stable. Electronically Signed   By: Lovey Newcomer M.D.   On: 07/17/2015 07:47   Dg Chest Port 1 View  07/15/2015  CLINICAL DATA:  Ventilator patient.  Acute respiratory failure. EXAM: PORTABLE CHEST 1 VIEW COMPARISON:  07/14/2015 and 07/13/2015. FINDINGS: 0721 hours. The endotracheal and nasogastric tubes appear unchanged. The heart size and mediastinal contours are stable. Cardiac septal defect occluder device noted. The overall pulmonary aeration has improved. There is residual patchy left lower lobe airspace disease. No significant pleural effusion or pneumothorax. IMPRESSION: Overall improving aeration of the lungs with residual probable left lower lobe atelectasis. Electronically Signed   By: Richardean Sale M.D.   On: 07/15/2015 07:37   Dg Chest Port 1 View  07/14/2015  CLINICAL DATA:  Acute respiratory failure EXAM: PORTABLE CHEST 1 VIEW COMPARISON:  07/13/15 FINDINGS: Endotracheal tube and nasogastric catheter are again identified and stable in appearance. Patchy changes are again noted in the bases bilaterally left greater than right. No new focal abnormality is seen. IMPRESSION: Stable appearance when compared with the prior exam. Electronically Signed   By: Inez Catalina M.D.   On: 07/14/2015 07:38   Dg Chest Port 1 View  07/13/2015  CLINICAL DATA:  Check endotracheal tube placement EXAM: PORTABLE CHEST 1 VIEW COMPARISON:  07/11/2015 FINDINGS: Cardiac shadow is within normal limits. The lungs are well aerated bilaterally. Patchy infiltrates are again seen particularly in the left  mid lung and left base. Some suggestion of early infiltrate is noted in the right medial lung base. A nasogastric catheter is seen in the stomach. The endotracheal tube lies in the trachea approximately 4 cm above the carina. No bony abnormality is seen. IMPRESSION: Endotracheal tube and nasogastric catheter as described. Patchy infiltrative changes left greater than right. Electronically Signed   By: Inez Catalina M.D.   On: 07/13/2015 18:40   Dg Chest Port 1 View  07/11/2015  CLINICAL DATA:  Hypertension, coronary artery disease EXAM: PORTABLE CHEST 1 VIEW COMPARISON:  07/09/2015 FINDINGS: Interval extubation. RIGHT central venous line remains. Normal cardiac silhouette. There is LEFT lower lobe airspace disease not changed. No pneumothorax. IMPRESSION: 1. Interval extubation without complication. 2. Persistent LEFT lower lobe airspace opacity. Electronically Signed   By: Suzy Bouchard M.D.   On: 07/11/2015 09:04   Dg Chest Port 1 View  07/09/2015  CLINICAL DATA:  Aspiration pneumonia, intubated patient, history of TIA. EXAM: PORTABLE CHEST 1 VIEW COMPARISON:  Portable chest x-ray of July 08, 2015. FINDINGS: The right lung is adequately inflated and clear. On the left there is persistent retrocardiac and lower lobe density slightly more conspicuous today. The cardiac silhouette is normal in size. The pulmonary vascularity is not engorged. The endotracheal tube tip lies approximately 3.9 cm above the carina. The esophagogastric tube tip projects below the inferior margin of the image. The right internal jugular venous catheter tip projects over the midportion of the SVC. IMPRESSION: Slight interval increase in left lower lobe parenchymal density consistent with aspiration pneumonia. The right lung is clear. There is no pulmonary edema. The support tubes are in reasonable position. Electronically Signed   By: David  Martinique M.D.   On: 07/09/2015 07:27   Dg Chest Port 1 View  07/08/2015  CLINICAL DATA:   Central line placement. EXAM: PORTABLE CHEST 1 VIEW COMPARISON:  Earlier film, same date. FINDINGS: The right IJ center venous catheter tip is in the distal SVC near the cavoatrial junction. The endotracheal tube and NG tubes are  stable. The heart and lungs are relatively stable. Slight improved aeration in the left lower lobe. IMPRESSION: Right IJ center venous catheter tip is in the distal SVC near the cavoatrial junction. No complicating features. The other support apparatus is stable. Slight improved left lower lobe aeration. Electronically Signed   By: Marijo Sanes M.D.   On: 07/08/2015 10:51   Dg Chest Portable 1 View  07/08/2015  CLINICAL DATA:  Post intubation EXAM: PORTABLE CHEST 1 VIEW COMPARISON:  06/29/2015 FINDINGS: Interval placement of an endotracheal tube with tip measuring 3.6 cm above the carina. Enteric tube tip is not visualized but is below the left hemidiaphragm, likely in the distal stomach. Borderline heart size with normal pulmonary vascularity. Focal airspace consolidation in the left mid lung as before. Developing consolidation in the right upper lung. No blunting of costophrenic angles. No pneumothorax. Calcification of the aorta. IMPRESSION: Appliances appear in satisfactory position. Focal consolidation in the left mid lung with developing consolidation in the right upper lung. Electronically Signed   By: Lucienne Capers M.D.   On: 07/08/2015 01:12   Dg Chest Port 1 View  06/21/2015  CLINICAL DATA:  Pneumonia.  Shortness of breath. EXAM: PORTABLE CHEST 1 VIEW COMPARISON:  Earlier same day.  04/15/2015. FINDINGS: Compared to the study of only 4 hours ago, there is rapidly worsening infiltrate in the left lower lobe. This raises the possibility of aspiration. Right chest appears clear in this projection. Presumed Watchman device projects over the heart. IMPRESSION: Development of rapid airspace filling in the left lower lobe consistent with pneumonia. Rapid development suggests  possible aspiration. Electronically Signed   By: Nelson Chimes M.D.   On: 07/06/2015 23:49   Dg Chest Portable 1 View  06/24/2015  CLINICAL DATA:  Code stroke.  Altered mental status. EXAM: PORTABLE CHEST 1 VIEW COMPARISON:  04/15/2015 FINDINGS: The patient has taken a poor inspiration. The heart is at the upper limits of normal in size. There is calcification of the thoracic aorta. Accentuated markings at the lung bases could relate to the poor inspiration. Mild basilar pneumonia not excluded on this portable exam. The upper lungs are clear. No evidence of heart failure or effusion. IMPRESSION: Poor inspiration. Prominent markings at the bases that could relate to the poor inspiration or could be secondary to basilar pneumonia. Electronically Signed   By: Nelson Chimes M.D.   On: 06/23/2015 19:45   Dg Abd Portable 1v  07/18/2015  CLINICAL DATA:  Tube placement EXAM: PORTABLE ABDOMEN - 1 VIEW COMPARISON:  07/17/2015 FINDINGS: The enteric tube extends into the stomach with tip in the region of the gastric fundus. IMPRESSION: Enteric tube extends into the stomach. Electronically Signed   By: Andreas Newport M.D.   On: 07/18/2015 23:28   Dg Swallowing Func-speech Pathology  07/12/2015  Objective Swallowing Evaluation: Type of Study: MBS-Modified Barium Swallow Study Patient Details Name: DONNIS SNOWDON MRN: [id number] Date of Birth: April 03, 1940 Today's Date: 07/12/2015 Time: SLP Start Time (ACUTE ONLY): 1254-SLP Stop Time (ACUTE ONLY): 1307 SLP Time Calculation (min) (ACUTE ONLY): 13 min Past Medical History: Past Medical History Diagnosis Date . Hx of transient ischemic attack (TIA)  . Hyperlipidemia  . ED (erectile dysfunction)  . Personal history of kidney stones  . Subarachnoid hemorrhage ([place])    '11- hospitalized -3 to 4 days(passed out-awoke in hospital)- no residual affects. . Myocardial infarction ([place])    12/1991 . Dysphagia  . Hypokalemia  . Hypothyroidism  . Sleep apnea  on CPAP-settings 6 . History  of kidney stones 02-27-13   x5- none recent . Throat cancer [facility])    '00-radiation therapy . Stroke ([place])  . Unspecified cerebral artery occlusion with cerebral infarction 11/08/2012 . Episodic confusion 02-27-13   hx. of, not currently(multiple episodes- ? TIA's)-always saw MD to be evaluated. Marland Kitchen AKI (acute kidney injury) ([place]) 02/23/2011   pt. denies any problems 02-27-13 . Hypertension    hx. labile - with spikes every 3-6 mos.-last 8'14 ED visit. Marland Kitchen Hypertensive encephalopathy  . Headache(784.0)    episodic with periods of confusion -multiple . Neuromuscular disorder ([place])    "parkinson's" -no tremor-sees neurology MD at [facility] . Coronary artery disease    Dr. Martinique Past Surgical History: Past Surgical History Procedure Laterality Date . Angioplasty  1993   LAD . Cardiac catheterization   . Coronary angioplasty   . Basal cell cancer     head . Hernia repair   . Appendectomy   . Patent foramen ovale closure  2008 . Eye surgery   . Colonoscopy N/A 06/18/2012   Procedure: COLONOSCOPY;  Surgeon: Jeryl Columbia, MD;  Location: [facility] ENDOSCOPY;  Service: Endoscopy;  Laterality: N/A;  h&p in file-[place]  . Hot hemostasis N/A 06/18/2012   Procedure: HOT HEMOSTASIS (ARGON PLASMA COAGULATION/BICAP);  Surgeon: Jeryl Columbia, MD;  Location: [facility] ENDOSCOPY;  Service: Endoscopy;  Laterality: N/A; . Colonoscopy with propofol N/A 03/07/2013   Procedure: COLONOSCOPY WITH PROPOFOL;  Surgeon: Jeryl Columbia, MD;  Location: [facility] ENDOSCOPY;  Service: Endoscopy;  Laterality: N/A; HPI: 75 y.o. male with PMH as outlined below including uncontrolled hypertension for which he has had multiple admissions. He was brought to [facility] ED 04/19 evening by his wife who informed staff that he was having a stroke due to the fact that he was unable to follow commands and had gargled speech. He has hx of hemorrhagic stroke and apparently presented very similarly at the time. He was last seen normal around 1pm earlier that afternoon. Wife stated that he never had any  focal deficits, only gargled speech and inability follow commands.  Subjective: pt alert, pleasant, HOH, says he coughs all the time when eating and drinking Assessment / Plan / Recommendation CHL IP CLINICAL IMPRESSIONS 07/12/2015 Therapy Diagnosis Mild oral phase dysphagia;Severe pharyngeal phase dysphagia Clinical Impression Pt has a mild oral and severe pharyngeal dysphagia, which appears only mildly exacerbated from baseline swallowing function when compared to prior MBS studies. Oral phase is discoordinated with poor bolus cohesion and prolonged transit time, but ultimately with good clearance. He has a delay in swallow trigger with all consistencies reaching the pyriform sinuses, which results in aspiration of honey thick liquids and purees. Aspiration is intermittently sensed, with delayed and inconsistent coughing not entirely effective at clearing aspirates. With Max cues for a chin tuck, he is able to protect his airway with purees and Dys 2 textures. Mild residue remains in the valleculae, although it is not significantly changed with more solid boluses. Recommend Dys 2 diet and pudding thick liquids with full supervision for use of chin tuck. Impact on safety and function Moderate aspiration risk;Severe aspiration risk   CHL IP TREATMENT RECOMMENDATION 07/12/2015 Treatment Recommendations Therapy as outlined in treatment plan below   Prognosis 07/12/2015 Prognosis for Safe Diet Advancement Good Barriers to Reach Goals Cognitive deficits;Severity of deficits;Time post onset Barriers/Prognosis Comment -- CHL IP DIET RECOMMENDATION 07/12/2015 SLP Diet Recommendations Dysphagia 2 (Fine chop) solids;Pudding thick liquid Liquid Administration via Spoon Medication Administration Crushed  with puree Compensations Minimize environmental distractions;Slow rate;Small sips/bites;Chin tuck Postural Changes Remain semi-upright after after feeds/meals (Comment);Seated upright at 90 degrees   CHL IP OTHER RECOMMENDATIONS  07/12/2015 Recommended Consults -- Oral Care Recommendations Oral care BID Other Recommendations Order thickener from pharmacy;Prohibited food (jello, ice cream, thin soups);Remove water pitcher   CHL IP FOLLOW UP RECOMMENDATIONS 07/12/2015 Follow up Recommendations (No Data)   CHL IP FREQUENCY AND DURATION 07/12/2015 Speech Therapy Frequency (ACUTE ONLY) min 2x/week Treatment Duration 2 weeks      CHL IP ORAL PHASE 07/12/2015 Oral Phase Impaired Oral - Pudding Teaspoon -- Oral - Pudding Cup -- Oral - Honey Teaspoon Decreased bolus cohesion;Delayed oral transit Oral - Honey Cup -- Oral - Nectar Teaspoon -- Oral - Nectar Cup -- Oral - Nectar Straw -- Oral - Thin Teaspoon -- Oral - Thin Cup -- Oral - Thin Straw -- Oral - Puree Decreased bolus cohesion;Delayed oral transit Oral - Mech Soft Decreased bolus cohesion;Delayed oral transit Oral - Regular -- Oral - Multi-Consistency -- Oral - Pill -- Oral Phase - Comment --  CHL IP PHARYNGEAL PHASE 07/12/2015 Pharyngeal Phase Impaired Pharyngeal- Pudding Teaspoon -- Pharyngeal -- Pharyngeal- Pudding Cup -- Pharyngeal -- Pharyngeal- Honey Teaspoon Delayed swallow initiation-pyriform sinuses;Reduced anterior laryngeal mobility;Reduced laryngeal elevation;Reduced tongue base retraction;Penetration/Aspiration before swallow;Pharyngeal residue - valleculae;Compensatory strategies attempted (with notebox) Pharyngeal Material enters airway, passes BELOW cords without attempt by patient to eject out (silent aspiration) Pharyngeal- Honey Cup -- Pharyngeal -- Pharyngeal- Nectar Teaspoon -- Pharyngeal -- Pharyngeal- Nectar Cup -- Pharyngeal -- Pharyngeal- Nectar Straw -- Pharyngeal -- Pharyngeal- Thin Teaspoon -- Pharyngeal -- Pharyngeal- Thin Cup -- Pharyngeal -- Pharyngeal- Thin Straw -- Pharyngeal -- Pharyngeal- Puree Delayed swallow initiation-pyriform sinuses;Reduced anterior laryngeal mobility;Reduced laryngeal elevation;Reduced tongue base retraction;Penetration/Aspiration before  swallow;Pharyngeal residue - valleculae;Compensatory strategies attempted (with notebox) Pharyngeal Material enters airway, passes BELOW cords without attempt by patient to eject out (silent aspiration) Pharyngeal- Mechanical Soft Delayed swallow initiation-pyriform sinuses;Reduced anterior laryngeal mobility;Reduced laryngeal elevation;Reduced tongue base retraction;Pharyngeal residue - valleculae;Compensatory strategies attempted (with notebox) Pharyngeal Material does not enter airway Pharyngeal- Regular -- Pharyngeal -- Pharyngeal- Multi-consistency -- Pharyngeal -- Pharyngeal- Pill -- Pharyngeal -- Pharyngeal Comment --  CHL IP CERVICAL ESOPHAGEAL PHASE 07/12/2015 Cervical Esophageal Phase Impaired Pudding Teaspoon -- Pudding Cup -- Honey Teaspoon Esophageal backflow into cervical esophagus Honey Cup -- Nectar Teaspoon -- Nectar Cup -- Nectar Straw -- Thin Teaspoon -- Thin Cup -- Thin Straw -- Puree Esophageal backflow into cervical esophagus Mechanical Soft Esophageal backflow into cervical esophagus Regular -- Multi-consistency -- Pill -- Cervical Esophageal Comment -- No flowsheet data found. [name], M.A. CCC-SLP [phone] [name] 07/12/2015, 1:44 PM               Time Spent in minutes  35   Louellen Molder M.D on 07/23/2015 at 12:17 PM  Between 7am to 7pm - Pager - [phone]  After 7pm go to [email or web address] - password [facility]  [facility] -  Office  [phone]

## 2015-07-23 NOTE — Progress Notes (Signed)
Speech Language Pathology Treatment: Dysphagia  Patient Details Name: Benjamin Park MRN: [id number] DOB: February 04, 1941 Today's Date: 07/23/2015 Time: [id number] SLP Time Calculation (min) (ACUTE ONLY): 18 min  Assessment / Plan / Recommendation Clinical Impression  Per chart review, note that pt is transitioning toward comfort care. [name] is eager to know what consistencies are recommended for him to be able to eat and drink. SLP provided ice chips with Mod cues for oral manipulation and initiation of swallow trigger. Delayed cough suggestive of aspiration. RN also reports having to remove secretions from his oropharynx. Reviewed with wife that at this stage, he is going to be at risk for aspiration with any texture.   If comfort feeds are desired, options could include offering altered textures, ice chips after oral care, and/or allowing him to take in any consistency that he may be interested in trying. As far as a modified diet is concerned, most recent MBS (and several prior) had recommended pudding thick liquids, which would essentially mean only pureed textures; however, given further decline in status and acute CVA, he is at a higher risk of aspiration with that now. SLP provided education about the use of oral care for comfort, careful feeding, and aspiration precautions so that she may utilize these recommendations with whichever textures she may choose. I would think that ice chips after oral care would probably be a good place to start given his oropharyngeal function and respiratory status, as anything more advanced than that would likely result in more gross aspiration.  We will remain available should they have any further questions or concerns. Otherwise, will follow from [place] as wife reports that they will plan to transition to inpatient hospice.   HPI HPI: 75 y.o. male with PMH as outlined below including uncontrolled hypertension, Parkinsons' disease.  He was brought to [facility] ED 04/19  evening by his wife who informed staff that he was having a stroke due to the fact that he was unable to follow commands and had gargled speech. He has hx of hemorrhagic stroke.Pt has hx of throat cancer with radiation from 2000; he has long hx of dysphagia with repeated MBS studies and frequent aspiration.   Last study completed showed mild oral and severe pharyngeal phase dysphagia.  Diet recommended was dys2/pudding.  Pt required intubation,  CXR showed LLL infiltrate likely related to aspiration.        SLP Plan  Continue with current plan of care     Recommendations  Diet recommendations: Other(comment) (comfort) Medication Administration: Via alternative means             Oral Care Recommendations: Oral care QID Follow up Recommendations: Other (comment) (hospice home per wife) Plan: Continue with current plan of care     GO               [name], M.A. CCC-SLP [phone]  [name] 07/23/2015, 3:36 PM

## 2015-07-23 NOTE — Progress Notes (Signed)
Daily Progress Note   Patient Name: Benjamin Park       Date: 07/23/2015 DOB: 10/19/1940  Age: 75 y.o. MRN#: [id number] Attending Physician: Louellen Molder, MD Primary Care Physician: No primary care provider on file. Admit Date: 07/08/2015  Reason for Consultation/Follow-up: Establishing goals of care  Subjective:  awake alert, in mild distress, appears chronically ill. Wife at bedside, see discussions below.   Length of Stay: 16  Current Medications: Scheduled Meds:  . ampicillin-sulbactam (UNASYN) IV  3 g Intravenous Q6H  . antiseptic oral rinse  7 mL Mouth Rinse q12n4p  . aspirin  325 mg Per Tube Daily  . chlorhexidine  15 mL Mouth Rinse BID  . feeding supplement (JEVITY 1.2 CAL)  1,000 mL Per Tube Q24H  . heparin  5,000 Units Subcutaneous Q8H  . hydrALAZINE  25 mg Oral Q8H  . insulin aspart  0-9 Units Subcutaneous Q4H  . ketorolac  15 mg Intravenous Once  . labetalol  100 mg Per Tube BID  . levothyroxine  112 mcg Per Tube QAC breakfast  . metoprolol  5 mg Intravenous Q6H  . pantoprazole (PROTONIX) IV  40 mg Intravenous Q24H  . potassium chloride  10 mEq Intravenous Q1 Hr x 3  . pravastatin  20 mg Per Tube q1800    Continuous Infusions: . dextrose 5 % 1,000 mL with potassium chloride 20 mEq infusion 75 mL/hr at 07/23/15 0912    PRN Meds: sodium chloride, fentaNYL (SUBLIMAZE) injection, hydrALAZINE  Physical Exam         Weak frail appearing elderly gentleman S1 S2 Coarse breath sounds anteriorly Abdomen soft Dark concentrated urine in foley bag No edema Moving his RUE, follows commands. Verbalizes some.   Vital Signs: BP 137/102 mmHg  Pulse 92  Temp(Src) 97.7 F (36.5 C) (Axillary)  Resp 17  Ht 6' (1.829 m)  Wt 65.7 kg (144 lb 13.5 oz)  BMI 19.64 kg/m2   SpO2 96% SpO2: SpO2: 96 % O2 Device: O2 Device: Venturi Mask O2 Flow Rate: O2 Flow Rate (L/min): 8 L/min  Intake/output summary:  Intake/Output Summary (Last 24 hours) at 07/23/15 1108 Last data filed at 07/23/15 0600  Gross per 24 hour  Intake   1775 ml  Output    750 ml  Net   1025 ml   LBM:  Last BM Date: 07/20/15 Baseline Weight: Weight: 68.5 kg (151 lb 0.2 oz) Most recent weight: Weight: 65.7 kg (144 lb 13.5 oz)       Palliative Assessment/Data:    Flowsheet Rows        Most Recent Value   Intake Tab    Referral Department  Hospitalist   Unit at Time of Referral  ICU   Palliative Care Primary Diagnosis  Neurology   Date Notified  07/19/15   Palliative Care Type  Return patient Palliative Care   Reason for referral  Non-pain Symptom   Date of Admission  07/04/2015   Date first seen by Palliative Care  07/20/15   # of days Palliative referral response time  1 Day(s)   # of days IP prior to Palliative referral  12   Clinical Assessment    Palliative Performance Scale Score  30%   Pain Max last 24 hours  5   Pain Min Last 24 hours  3   Dyspnea Max Last 24 Hours  4   Dyspnea Min Last 24 hours  3   Psychosocial & Spiritual Assessment    Palliative Care Outcomes    Patient/Family meeting held?  Yes   Who was at the meeting?  wife    Palliative Care Outcomes  Clarified goals of care   Palliative Care follow-up planned  Yes, Facility      Patient Active Problem List   Diagnosis Date Noted  . Encounter for palliative care   . Goals of care, counseling/discussion   . Acute respiratory failure with hypoxemia ([place])   . SOB (shortness of breath)   . Acute respiratory failure with hypoxia ([place])   . Aspiration pneumonia of both lower lobes due to vomit ([place])   . Confusion   . Hypokalemia 06/28/2015  . Aspiration pneumonia ([place]) 07/15/2015  . Hypertensive urgency, malignant 07/06/2015  . Faintness   . Orthostatic hypotension   . History of TIA (transient ischemic  attack)   . Thyroid activity decreased   . Syncope 04/15/2015  . 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 HLD (hyperlipidemia)   . Lactic acid acidosis   . Respiratory failure ([place])   . Altered mental status 03/11/2015  . Malnutrition of moderate degree ([place]) 08/15/2014  . Encephalopathy acute 08/14/2014  . chronic Dysphagia 08/14/2014  . Fever 08/14/2014  . Tick bite 08/14/2014  . Hypothyroidism 08/14/2014  . Frequent falls 08/14/2014  . Vomiting 08/14/2014  . Encephalopathy, hypertensive 02/02/2014  . Leukocytosis 02/02/2014  . Acute encephalopathy 01/31/2014  . Accelerated hypertension 01/31/2014  . Epilepsy without status epilepticus, not intractable ([place]) 11/20/2012  . Oral thrush 11/16/2012  . Labile hypertension 11/11/2012  . Hypertensive urgency 11/11/2012  . Acute respiratory failure ([place]) 11/11/2012  . Hypertensive emergency 11/11/2012  . Abnormal involuntary movement 11/08/2012  . Chronic pulmonary aspiration 07/15/2012  . Syncope and collapse 03/19/2011  . Mild dehydration 03/19/2011  . AKI (acute kidney injury) ([place]) 02/23/2011  . S/P patent foramen ovale closure 09/27/2010  . Hypertension   . Subarachnoid hemorrhage ([place])   . Coronary artery disease   . Sleep apnea   . Hx of transient ischemic attack (TIA)   . Hyperlipidemia   . Throat cancer ([place])   . Hypertensive encephalopathy   . ED (erectile dysfunction)   . Personal history of kidney stones     Palliative Care Assessment & Plan   Patient Profile:  50 you gentleman  with prolonged hospitalization, s/p VDRF, recent aspiration events, worsening neurological status.   Assessment:  recurrent aspiration events Neuro cognitive decline.  HTN TIA   Recommendations/Plan:   extensive discussions held with wife this am: discussed about continuing with DNR DNI established this am. Wife told RNs earlier this am that she was hesitant about DNR DNI. All  questions answered to the best of my ability, she is accepting of and agreeable to continuing DNR DNI.   Discussed regarding artificial nutrition and hydration given these circumstances. Patient with tenuous respiratory status, not safe enough for PEG placement, it wouldn't prevent aspiration anyway. Patient with aspiration events on NGT, not able to tolerate tube feeds. Discussed with wife about comfort feeds. Wife wishes to discuss with her friend who is an Therapist, sports and will get back to me later today. Patient nods his head and mumbles "yes" when asked whether he would want the NGT d/c.     Wife has been contemplating about taking the patient home with hospice if he is not able to go to SNF towards the end of this hospitalization. She states that he is much weaker and is declining, she is not sure she can take care of him at home. Detailed discussions and information given about Hospice Home. Discussed about benefits of residential hospice. Wife wishes to discuss with her friend who is an Therapist, sports and will get back to me later today.   Goals of Care and Additional Recommendations:  Limitations on Scope of Treatment: do not restart tube feeds for now. Wife deliberating with her friends about considering comfort feeds and comfort measures   Code Status:    Code Status Orders        Start     Ordered   07/23/15 [phone]  Do not attempt resuscitation (DNR)   Continuous    Question Answer Comment  In the event of cardiac or respiratory ARREST Do not call a "code blue"   In the event of cardiac or respiratory ARREST Do not perform Intubation, CPR, defibrillation or ACLS   In the event of cardiac or respiratory ARREST Use medication by any route, position, wound care, and other measures to relive pain and suffering. May use oxygen, suction and manual treatment of airway obstruction as needed for comfort.      07/23/15 0840    Code Status History    Date Active Date Inactive Code Status Order ID Comments User  Context   07/08/2015 12:54 AM 07/23/2015  8:41 AM Full Code [id number]  Rahul Dianna Rossetti, PA-C ED   04/15/2015  8:36 PM 04/16/2015  8:17 PM Full Code [id number]  Merton Border, MD ED   03/12/2015  4:32 AM 03/18/2015  6:23 PM Full Code [id number]  Charlynne Cousins, MD Inpatient   08/14/2014  5:53 PM 08/17/2014  2:41 PM Full Code [id number]  Delfina Redwood, MD Inpatient   01/31/2014  9:49 PM 02/03/2014  9:47 PM Full Code [id number]  Merton Border, MD Inpatient   11/11/2012  7:47 PM 11/19/2012  6:16 PM Full Code [id number]  Louellen Molder, MD ED   03/20/2011  3:29 AM 03/21/2011  6:36 PM Full Code [id number]  Joellen Jersey, RN Inpatient   02/23/2011 11:05 PM 02/26/2011  9:03 PM Full Code [id number]  Arie Sabina, RN ED       Prognosis:   < 2 weeks  Discharge Planning:  To Be Determined ? Transfer to residential hospice by 5-6 if wife agreeable. Will follow up.  Care plan was discussed with patient, patient's wife, RN.    Thank you for allowing the Palliative Medicine Team to assist in the care of this patient.   Time In:  10 Time Out: 1035 Total Time 35 Prolonged Time Billed  no       Greater than 50%  of this time was spent counseling and coordinating care related to the above assessment and plan. [id number] Loistine Chance, MD  Please contact Palliative Medicine Team phone at [phone] for questions and concerns.

## 2015-07-24 LAB — GLUCOSE, CAPILLARY
GLUCOSE-CAPILLARY: 140 mg/dL — AB (ref 65–99)
Glucose-Capillary: 129 mg/dL — ABNORMAL HIGH (ref 65–99)
Glucose-Capillary: 128 mg/dL — ABNORMAL HIGH (ref 65–99)
Glucose-Capillary: 134 mg/dL — ABNORMAL HIGH (ref 65–99)

## 2015-07-24 MED ORDER — MORPHINE SULFATE (CONCENTRATE) 10 MG/0.5ML PO SOLN
5.0000 mg | ORAL | Status: DC | PRN
  Administered 2015-07-25: 5 mg via ORAL
  Filled 2015-07-24: qty 0.5

## 2015-07-24 MED ORDER — LORAZEPAM 2 MG/ML PO CONC
2.0000 mg | ORAL | Status: DC | PRN
  Administered 2015-07-25 – 2015-07-26 (×3): 2 mg via ORAL
  Filled 2015-07-24 (×3): qty 1

## 2015-07-24 MED ORDER — CLONIDINE HCL 0.1 MG/24HR TD PTWK
0.1000 mg | MEDICATED_PATCH | TRANSDERMAL | Status: DC
  Administered 2015-07-24: 0.1 mg via TRANSDERMAL
  Filled 2015-07-24: qty 1

## 2015-07-24 MED ORDER — ASPIRIN 300 MG RE SUPP
300.0000 mg | Freq: Every day | RECTAL | Status: DC
  Administered 2015-07-24 – 2015-07-25 (×2): 300 mg via RECTAL
  Filled 2015-07-24 (×2): qty 1

## 2015-07-24 MED ORDER — BISACODYL 10 MG RE SUPP: 10.0000 mg | Freq: Every day | RECTAL | Status: DC | PRN

## 2015-07-24 NOTE — Progress Notes (Signed)
PROGRESS NOTE                                                                                                                                                                                                             Patient Demographics:    Benjamin Park, is a 75 y.o. male, DOB - 1940/11/02, [id number]  Admit date - 07/01/2015   Admitting Physician Rush Farmer, MD  Outpatient Primary MD for the patient is No primary care provider on file.  LOS - 17  Outpatient Specialists:   No chief complaint on file.      Brief Narrative   75 year old male with history of uncontrolled hypertension, history of TIAs and stroke, frequent falls, sleep apnea, history of subarachnoid hemorrhage, MI with angioplasty, dysphagia, hypothyroidism, neuromuscular disorder, hyperlipidemia was brought to the hospital on 4/19 after he had slurred speech and unable to follow commands. He had history of hemorrhagic stroke previously with similar presentation. In the ED he had elevated blood pressure in the low 200s and was started on a new nicardipine drip but rate showed his blood pressure dropped into low 100s. Head CT was unremarkable for acute infarct or hemorrhage. Neurology evaluated the patient in the ED and while he was then he had acute change in mental status and became minimally responsive. Chest x-ray showed new left lower lobe infiltrate concerning for aspiration.  he was admitted to the ICU intubated. He was then transferred to medical floor on 4/21 after being successfully extubated. Started to improve and was planned for discharge on 4/25 but desaturated again with concern for repeat aspiration, became lethargic and blood pressure again elevated to [id number] systolic. Patient was again intubated for airway protection and transferred to ICU. Patient was then found to have left-sided hemiparesis. MRI was done which  upon review by stroke team  suggested to have bilateral frontal MCA, ACA infarct of embolic pattern. EEG done showed triphasic wave without seizure activity. Patient was extubated on 4/29. Patient continues to have left-sided weakness and failed swallow. Transferred to hospitalist service on 5/1.  On 5/3 patient went into respiratory distress and hypertensive. Chest x-ray showed atelectasis. Possibly had a mucous plug and aspiration. Required NRB and Transfer to stepdown unit.     Subjective:  The patient's wife is at bedside.  The patient has not done well with  small PO challenges over the past 24 hours.  However, he tolerates ice chips.  She has agreed to hospice referral but still seems hesitant about him not having a feeding tube.   Assessment  & Plan :   Principal problem Acute hypoxic respiratory failure Secondary to recurrent aspiration pneumonia. Required Intubation twice during hospital course. Completed antibiotic course on 4/28. Given recurrent aspiration on 5/3 placed him on Unasyn.  failed swallow evaluation recurrently.Started him on NG feeds on 5/2, held due to aspiration and respiratory distress. -Patient went into respiratory distress again on 5/3, Likely due to aspiration and mucous plug.  -Monitored in stepdown. Now on nasal cannula 8L.  Will transfer to 6N per discussion with Dr. Rowe Pavy of palliative care.  We need to actively start the transition to hospice, which the patient's wife is slowly processing.  This patient is not ready to discharge yet because I do not think the wife fully understands the implications of this transition.  Dr. Rowe Pavy to follow-up with the patient this afternoon.   Active Problems: Acute encephalopathy Likely a combination of aspiration pneumonia, malignant hypertension and CVA -Needs Aggressive blood pressure control.  appears that his mental status deteriorates every time his blood pressure increases. MRI suggests pt might have PRES but neurology thinks he has stroke. -on  when necessary IV hydralazine scheduled IV metoprolol. -Still holding tube feeds  Acute bilateral frontal embolic stroke Source unclear. 2-D echo with normal EF and no cardiac source of emboli. Lower extremity Doppler negative for DVT. On full dose aspirin and statin (held as patient nothing by mouth) Now anticipating transition to hospice.  Uncontrolled hypertension with hypertensive emergency Patient will not be able to get IV anti-hypertensives with hospice.  Need to actively transition to an alternative regimen.  The patient's wife is NOT comfortable with NOT treating his blood pressure at this time.  Hypothyroidism Will plan to discontinue synthroid at this point.  Anticipating hospice transfer soon.  Severe dysphagia Holding NG feeds due to encephalopathy and ongoing aspiration.  Ice chips for comfort.  PO intake for comfort at patient's own risk.  This was discussed explicitly with his wife.  hypernatremia Will stop checking labs now.  Hypokalemia Will stop checking labs now.   Goals of care Poor Overall prognosis. Palliative care following. Wife wants to see for another day for any possible improvement. She understands that he is going to have recurrent aspiration and repeatedly failed swallow. Given his respiratory status is not stable for PEG tube feeding at this time. DO NOT RESUSCITATE/DO NOT INTUBATE and wife agrees.  Dr. Rowe Pavy to follow-up this afternoon.  We have discussed the plan of care.  Code StatusDO NOT RESUSCITATE Family Communication  : Wife at bedside  Disposition Plan  :  Residential Hospice when ready.  Barriers For Discharge :  Family acceptance of prognosis, plan of care; bed availability  Consults  :   PC CM Neurology Palliative care     DVT Prophylaxis  :  SQ heparin  Lab Results  Component Value Date   PLT 247 07/23/2015    CULTURES: Blood 04/20 > NG final Sputum 04/20 > Klebsiella pneumoniae, Pseudomonas (sensitivities  reported)  ANTIBIOTICS: Zosyn 04/20 >>>4/28 Vanc 4/20>>>4/22, restarted 4/25>>4/27 Unasyn 5/3-07/24/2015   LINES/TUBES: ETT 04/20 >4/21, 4/25-29  Procedures Head CT MRI brain 2-D echo Doppler lower extremities EEG   Anti-infectives    Start     Dose/Rate Route Frequency Ordered Stop   07/21/15 1200  Ampicillin-Sulbactam (UNASYN) 3 g  in sodium chloride 0.9 % 100 mL IVPB     3 g 100 mL/hr over 60 Minutes Intravenous Every 6 hours 07/21/15 1113     07/13/15 1800  vancomycin (VANCOCIN) IVPB 1000 mg/200 mL premix  Status:  Discontinued     1,000 mg 200 mL/hr over 60 Minutes Intravenous Every 12 hours 07/13/15 1757 07/15/15 1040   07/08/15 1800  vancomycin (VANCOCIN) IVPB 750 mg/150 ml premix  Status:  Discontinued     750 mg 150 mL/hr over 60 Minutes Intravenous Every 12 hours 07/08/15 0050 07/09/15 0942   07/08/15 0800  piperacillin-tazobactam (ZOSYN) IVPB 3.375 g     3.375 g 12.5 mL/hr over 240 Minutes Intravenous Every 8 hours 07/08/15 0050 07/15/15 2359   07/08/15 0100  vancomycin (VANCOCIN) IVPB 1000 mg/200 mL premix     1,000 mg 200 mL/hr over 60 Minutes Intravenous  Once 07/08/15 0050 07/08/15 0809   07/08/15 0100  piperacillin-tazobactam (ZOSYN) IVPB 3.375 g  Status:  Discontinued     3.375 g 100 mL/hr over 30 Minutes Intravenous  Once 07/08/15 0050 07/16/15 0750   07/08/15 0000  metroNIDAZOLE (FLAGYL) IVPB 500 mg     500 mg 100 mL/hr over 60 Minutes Intravenous  Once 06/30/2015 2359 07/08/15 1900   06/21/2015 2015  cefTRIAXone (ROCEPHIN) 1 g in dextrose 5 % 50 mL IVPB     1 g 100 mL/hr over 30 Minutes Intravenous  Once 07/01/2015 2012 06/30/2015 2152   07/05/2015 2015  azithromycin (ZITHROMAX) 500 mg in dextrose 5 % 250 mL IVPB     500 mg 250 mL/hr over 60 Minutes Intravenous  Once 06/24/2015 2012 07/17/2015 2318        Objective:   Filed Vitals:   07/23/15 2036 07/24/15 0035 07/24/15 0411 07/24/15 0800  BP: 107/89 114/90 116/79 101/74  Pulse: 120 95 95 90  Temp: 98.5 F  (36.9 C) 98.8 F (37.1 C) 97.9 F (36.6 C) 97.6 F (36.4 C)  TempSrc: Oral Oral Axillary Axillary  Resp: 24 23 17 16   Height:      Weight:   64.5 kg (142 lb 3.2 oz)   SpO2: 91% 93% 96% 93%    Wt Readings from Last 3 Encounters:  07/24/15 64.5 kg (142 lb 3.2 oz)  04/15/15 68.539 kg (151 lb 1.6 oz)  03/13/15 75.6 kg (166 lb 10.7 oz)     Intake/Output Summary (Last 24 hours) at 07/24/15 0934 Last data filed at 07/24/15 0700  Gross per 24 hour  Intake   1735 ml  Output   1375 ml  Net    360 ml     Physical Exam  Gen: on 8L [place], eyes open, follows some commands HEENT: dry mucosa, supple neck Chest: Diminished breath sounds bilaterally CVS: NR/RR GI: soft, NT, ND, BS+ Musculoskeletal: warm, no edema CNS: Awake, poorly communicative, has some movement in his left sided extremities    Data Review:    CBC  Recent Labs Lab 07/18/15 0333 07/19/15 0407 07/21/15 0949 07/22/15 0351 07/23/15 0430  WBC 4.5 6.6 19.0* 15.2* 14.8*  HGB 14.1 14.6 15.6 14.1 12.9*  HCT 43.7 44.8 48.0 44.8 41.0  PLT 247 265 292 239 247  MCV 99.5 99.1 98.4 100.4* 101.5*  MCH 32.1 32.3 32.0 31.6 31.9  MCHC 32.3 32.6 32.5 31.5 31.5  RDW 13.3 13.4 13.5 13.9 13.9    Chemistries   Recent Labs Lab 07/19/15 0407 07/20/15 0617 07/21/15 0949 07/22/15 0351 07/23/15 0430  NA 148* 148* 147* 153*  155*  K 3.5 3.7 3.2* 3.8 3.1*  CL 110 112* 112* 116* 116*  CO2 26 28 24 24 28   GLUCOSE 112* 96 134* 114* 119*  BUN 40* 41* 29* 36* 33*  CREATININE 1.13 1.12 1.07 1.36* 1.21  CALCIUM 9.1 8.9 9.1 8.9 9.0    Lab Results  Component Value Date   HGBA1C 5.6 07/16/2015   Cardiac Enzymes  Recent Labs Lab 07/21/15 0949 07/21/15 1512 07/21/15 2225  TROPONINI <0.03 0.05* 0.05*   Inpatient Medications  Scheduled Meds: . ampicillin-sulbactam (UNASYN) IV  3 g Intravenous Q6H  . antiseptic oral rinse  7 mL Mouth Rinse q12n4p  . aspirin  325 mg Per Tube Daily  . chlorhexidine  15 mL Mouth Rinse  BID  . feeding supplement (JEVITY 1.2 CAL)  1,000 mL Per Tube Q24H  . heparin  5,000 Units Subcutaneous Q8H  . hydrALAZINE  25 mg Oral Q8H  . insulin aspart  0-9 Units Subcutaneous Q4H  . ketorolac  15 mg Intravenous Once  . labetalol  100 mg Per Tube BID  . levothyroxine  112 mcg Per Tube QAC breakfast  . metoprolol  5 mg Intravenous Q6H  . pantoprazole (PROTONIX) IV  40 mg Intravenous Q24H  . pravastatin  20 mg Per Tube q1800   Continuous Infusions: . dextrose 5 % 1,000 mL with potassium chloride 20 mEq infusion 75 mL/hr at 07/24/15 0428   PRN Meds:.sodium chloride, fentaNYL (SUBLIMAZE) injection, hydrALAZINE  Time Spent in minutes  45, mostly counseling the patient's wife on expectations with hospice transition and coordinating care with consultant and social work  Eber Littler M.D on 07/24/2015 at 9:34 AM  [facility] -  Office  [phone]

## 2015-07-24 NOTE — Progress Notes (Signed)
Pt transferred from 13M this evening for palliative care measures. Alert to self. No signs of pain or discomfort noted

## 2015-07-24 NOTE — Progress Notes (Signed)
Daily Progress Note   Patient Name: Benjamin Park       Date: 07/24/2015 DOB: 12-11-1940  Age: 75 y.o. MRN#: [id number] Attending Physician: Lily Kocher, MD Primary Care Physician: No primary care provider on file. Admit Date: 07/11/2015  Reason for Consultation/Follow-up: Establishing goals of care  Subjective:  awake not as alert, in mild distress, appears chronically ill. NGT now D/C. Wife states patient is only asking for some ice chips, not wanting to eat anything else. Trial of comfort feeds underway since 07-23-15 when a more palliative/ comfort based approach to care was discussed and agreed upon  Wife at bedside, see discussions below.  Discussed with Dr Eulas Post this am.  Length of Stay: 17  Current Medications: Scheduled Meds:  . antiseptic oral rinse  7 mL Mouth Rinse q12n4p  . aspirin  300 mg Rectal Daily  . chlorhexidine  15 mL Mouth Rinse BID  . cloNIDine  0.1 mg Transdermal Weekly    Continuous Infusions:    PRN Meds: sodium chloride, bisacodyl, hydrALAZINE, LORazepam, morphine CONCENTRATE  Physical Exam         Weak frail appearing elderly gentleman S1 S2 Coarse breath sounds anteriorly Abdomen soft Dark concentrated urine in foley bag No edema Moving his RUE, follows commands.   Vital Signs: BP 101/74 mmHg  Pulse 90  Temp(Src) 98.3 F (36.8 C) (Oral)  Resp 16  Ht 6' (1.829 m)  Wt 64.5 kg (142 lb 3.2 oz)  BMI 19.28 kg/m2  SpO2 93% SpO2: SpO2: 93 % O2 Device: O2 Device: Nasal Cannula O2 Flow Rate: O2 Flow Rate (L/min): 6 L/min  Intake/output summary:   Intake/Output Summary (Last 24 hours) at 07/24/15 1250 Last data filed at 07/24/15 0700  Gross per 24 hour  Intake   1600 ml  Output   1375 ml  Net    225 ml   LBM: Last BM Date:  07/20/15 Baseline Weight: Weight: 68.5 kg (151 lb 0.2 oz) Most recent weight: Weight: 64.5 kg (142 lb 3.2 oz)       Palliative Assessment/Data:    Flowsheet Rows        Most Recent Value   Intake Tab    Referral Department  Hospitalist   Unit at Time of Referral  ICU   Palliative Care Primary Diagnosis  Neurology  Date Notified  07/19/15   Palliative Care Type  Return patient Palliative Care   Reason for referral  Non-pain Symptom   Date of Admission  07/16/2015   Date first seen by Palliative Care  07/20/15   # of days Palliative referral response time  1 Day(s)   # of days IP prior to Palliative referral  12   Clinical Assessment    Palliative Performance Scale Score  30%   Pain Max last 24 hours  5   Pain Min Last 24 hours  3   Dyspnea Max Last 24 Hours  4   Dyspnea Min Last 24 hours  3   Psychosocial & Spiritual Assessment    Palliative Care Outcomes    Patient/Family meeting held?  Yes   Who was at the meeting?  wife    Palliative Care Outcomes  Clarified goals of care   Palliative Care follow-up planned  Yes, Facility      Patient Active Problem List   Diagnosis Date Noted  . Dyspnea   . Encounter for palliative care   . Goals of care, counseling/discussion   . Acute respiratory failure with hypoxemia ([place])   . SOB (shortness of breath)   . Acute respiratory failure with hypoxia ([place])   . Aspiration pneumonia of both lower lobes due to vomit ([place])   . Confusion   . Hypokalemia 07/14/2015  . Aspiration pneumonia ([place]) 06/19/2015  . Hypertensive urgency, malignant 07/03/2015  . Faintness   . Orthostatic hypotension   . History of TIA (transient ischemic attack)   . Thyroid activity decreased   . Syncope 04/15/2015  . 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 HLD (hyperlipidemia)   . Lactic acid acidosis   . Respiratory failure ([place])   . Altered mental status 03/11/2015  . Malnutrition of moderate degree  ([place]) 08/15/2014  . Encephalopathy acute 08/14/2014  . chronic Dysphagia 08/14/2014  . Fever 08/14/2014  . Tick bite 08/14/2014  . Hypothyroidism 08/14/2014  . Frequent falls 08/14/2014  . Vomiting 08/14/2014  . Encephalopathy, hypertensive 02/02/2014  . Leukocytosis 02/02/2014  . Acute encephalopathy 01/31/2014  . Accelerated hypertension 01/31/2014  . Epilepsy without status epilepticus, not intractable ([place]) 11/20/2012  . Oral thrush 11/16/2012  . Labile hypertension 11/11/2012  . Hypertensive urgency 11/11/2012  . Acute respiratory failure ([place]) 11/11/2012  . Hypertensive emergency 11/11/2012  . Abnormal involuntary movement 11/08/2012  . Chronic pulmonary aspiration 07/15/2012  . Syncope and collapse 03/19/2011  . Mild dehydration 03/19/2011  . AKI (acute kidney injury) ([place]) 02/23/2011  . S/P patent foramen ovale closure 09/27/2010  . Hypertension   . Subarachnoid hemorrhage ([place])   . Coronary artery disease   . Sleep apnea   . Hx of transient ischemic attack (TIA)   . Hyperlipidemia   . Throat cancer ([place])   . Hypertensive encephalopathy   . ED (erectile dysfunction)   . Personal history of kidney stones     Palliative Care Assessment & Plan   Patient Profile:  21 you gentleman with prolonged hospitalization, s/p VDRF, recent aspiration events, worsening neurological status.   Assessment:  recurrent aspiration events Neuro cognitive decline.  HTN TIA   Recommendations/Plan:    Agree with catapres patch for BP control. ongoing discussions with wife regarding full scope of comfort measures. May continue PRN IV Hydralazine while the patient is in the hospital. Discussed with wife about using prn benzos and opioids for symptom management, she  is agreeable  Will add low dose SL PRN morphine and Ativan  Agree with transfer to 6N, wife is agreeable  Will d/c heparin and CBG checks, pulse Ox  Continue comfort feeds, discussed with wife about lack of interest  in eating and drinking is a sign of end of life process.   Will d/c IVF, avoid volume overload issues, goals are for full comfort care.   Discussed about hospice home, wife prefers [facility], SW consult re entered.   Code Status:    Code Status Orders        Start     Ordered   07/23/15 0841  Do not attempt resuscitation (DNR)   Continuous    Question Answer Comment  In the event of cardiac or respiratory ARREST Do not call a "code blue"   In the event of cardiac or respiratory ARREST Do not perform Intubation, CPR, defibrillation or ACLS   In the event of cardiac or respiratory ARREST Use medication by any route, position, wound care, and other measures to relive pain and suffering. May use oxygen, suction and manual treatment of airway obstruction as needed for comfort.      07/23/15 0840    Code Status History    Date Active Date Inactive Code Status Order ID Comments User Context   07/08/2015 12:54 AM 07/23/2015  8:41 AM Full Code [id number]  Rahul Dianna Rossetti, PA-C ED   04/15/2015  8:36 PM 04/16/2015  8:17 PM Full Code [id number]  Merton Border, MD ED   03/12/2015  4:32 AM 03/18/2015  6:23 PM Full Code [id number]  Charlynne Cousins, MD Inpatient   08/14/2014  5:53 PM 08/17/2014  2:41 PM Full Code [id number]  Delfina Redwood, MD Inpatient   01/31/2014  9:49 PM 02/03/2014  9:47 PM Full Code [id number]  Merton Border, MD Inpatient   11/11/2012  7:47 PM 11/19/2012  6:16 PM Full Code [id number]  Louellen Molder, MD ED   03/20/2011  3:29 AM 03/21/2011  6:36 PM Full Code [id number]  Joellen Jersey, RN Inpatient   02/23/2011 11:05 PM 02/26/2011  9:03 PM Full Code [id number]  Arie Sabina, RN ED       Prognosis:   < 2 weeks  Discharge Planning:  6N, continue comfort measures ? Transfer to residential hospice by 5-7    Care plan was discussed with patient, patient's wife, Dr Eulas Post    Thank you for allowing the Palliative Medicine Team to assist in the care of this patient.   Time In:   12 Time Out: 1235 Total Time 35 Prolonged Time Billed  no       Greater than 50%  of this time was spent counseling and coordinating care related to the above assessment and plan. [id number] Loistine Chance, MD  Please contact Palliative Medicine Team phone at [phone] for questions and concerns.

## 2015-07-24 NOTE — Progress Notes (Signed)
Late Entry:  CSW received a call this morning from MD that wife has decided to seek residential hospice care for patient and desires [facility].  CSW spoke with wife Pamala Hurry who confirmed above; [place] spoke with Erling Conte- [place] for [facility] who accepted referral. She indicated that there is currently no vacancies at [facility] nor is a vacancy anticipated tomorrow.  Discussed in detail with patient's wife that further bed search was indicated due to above for residential hospice care. While disappointed, she verbalized understanding. Discussed surrounding area hospice homes and her 2nd choice is [facility]). She works at the [employer] in [employer] and indicates that the hospice home is not far from where she works.  Referral completed to Nunzio Cory at the [facility] who indicated that she would contact wife to arrange a meeting tomorrow and will evaluate patient for appropriateness of hospice home.  Per MD- patient can be d/c'd once arrangements can be made.  Discussed with wife other hospice home choices; she indicated that if she cannot get patient placed at [facility] or [facility] then she will take him home for hospice care. 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  SW tomorrow will follow up with [facility] and assist with d/c as indicated  Butch Penny T. Pauline Good, [place]  (weekend coverage)

## 2015-07-25 NOTE — Progress Notes (Signed)
Oral mouthcare and prn ativan administered as ordered

## 2015-07-25 NOTE — Consult Note (Signed)
[facility] Liaison:  Received [facility] referral from [place] 07/24/15. Spoke with spouse by phone and met with her this morning. [facility] can offer room for Monday, 2015/08/17 if transfer still makes sense. Spouse reports patient is "different" this morning and questioning if transfer would be safe. Will reach out to w/e CSW to make aware of availability on Monday.   Thank you. Erling Conte, [place]

## 2015-07-25 NOTE — Progress Notes (Signed)
PROGRESS NOTE                                                                                                                                                                                                             Patient Demographics:    Benjamin Park, is a 75 y.o. male, DOB - 05-29-1940, [id number]  Admit date - 07/12/2015   Admitting Physician Rush Farmer, MD  Outpatient Primary MD for the patient is No primary care provider on file.  LOS - 18  Outpatient Specialists:   No chief complaint on file.      Brief Narrative   75 year old male with history of uncontrolled hypertension, history of TIAs and stroke, frequent falls, sleep apnea, history of subarachnoid hemorrhage, MI with angioplasty, dysphagia, hypothyroidism, neuromuscular disorder, hyperlipidemia was brought to the hospital on 4/19 after he had slurred speech and unable to follow commands. He had history of hemorrhagic stroke previously with similar presentation. In the ED he had elevated blood pressure in the low 200s and was started on a new nicardipine drip but rate showed his blood pressure dropped into low 100s. Head CT was unremarkable for acute infarct or hemorrhage. Neurology evaluated the patient in the ED and while he was then he had acute change in mental status and became minimally responsive. Chest x-ray showed new left lower lobe infiltrate concerning for aspiration.  he was admitted to the ICU intubated. He was then transferred to medical floor on 4/21 after being successfully extubated. Started to improve and was planned for discharge on 4/25 but desaturated again with concern for repeat aspiration, became lethargic and blood pressure again elevated to [id number] systolic. Patient was again intubated for airway protection and transferred to ICU. Patient was then found to have left-sided hemiparesis. MRI was done which  upon review by stroke team  suggested to have bilateral frontal MCA, ACA infarct of embolic pattern. EEG done showed triphasic wave without seizure activity. Patient was extubated on 4/29. Patient continues to have left-sided weakness and failed swallow. Transferred to hospitalist service on 5/1.  On 5/3 patient went into respiratory distress and hypertensive. Chest x-ray showed atelectasis. Possibly had a mucous plug and aspiration. Required NRB and Transfer to stepdown unit.  Transferred to the palliative unit 07/24/2015.  DNR status.  Awaiting transfer to inpatient hospice facility.  Patient's wife  has become more accepting of prognosis, plan of care over the weekend.  Palliative care consult team greatly appreciated.    Subjective:  The patient was up most of the night; subsequently slept most of the day.   Assessment  & Plan :   Principal problem Acute hypoxic respiratory failure Secondary to recurrent aspiration pneumonia. Required Intubation twice during hospital course. Completed antibiotic course on 4/28. Given recurrent aspiration on 5/3 placed him on Unasyn.  failed swallow evaluation recurrently.Started him on NG feeds on 5/2, held due to aspiration and respiratory distress. -Patient went into respiratory distress again on 5/3, Likely due to aspiration and mucous plug.  --Actively declining.  Comfort measures in place.   Active Problems: Acute encephalopathy Likely a combination of aspiration pneumonia, malignant hypertension and CVA -Needs Aggressive blood pressure control.  appears that his mental status deteriorates every time his blood pressure increases. MRI suggests pt might have PRES but neurology thinks he has stroke. --No tube feedings --Clonidine patch for BP control  Acute bilateral frontal embolic stroke Source unclear. 2-D echo with normal EF and no cardiac source of emboli. Lower extremity Doppler negative for DVT. On full dose aspirin and statin (held as patient nothing by mouth) Now  anticipating transition to hospice.  Uncontrolled hypertension with hypertensive emergency Clonidine patch for primary blood pressure control.  IV PRN meds will not be continued at hospice.  Hypothyroidism Stopped synthroid due to end of life care.  Severe dysphagia Becoming too weak to swallow; wife is aware.  Goals of care Poor Overall prognosis. DO NOT RESUSCITATE/DO NOT INTUBATE and wife agrees.  Palliative Care following Awaiting bed at inpatient hospice  Code StatusDO NOT RESUSCITATE Family Communication  : Wife at bedside  Disposition Plan  :  Residential Hospice when bed available.  Barriers For Discharge :  Bed availability  Consults  :   PC CM Neurology Palliative care     DVT Prophylaxis  :  SQ heparin  Lab Results  Component Value Date   PLT 247 07/23/2015    CULTURES: Blood 04/20 > NG final Sputum 04/20 > Klebsiella pneumoniae, Pseudomonas (sensitivities reported)  ANTIBIOTICS: Zosyn 04/20 >>>4/28 Vanc 4/20>>>4/22, restarted 4/25>>4/27 Unasyn 5/3-07/24/2015   LINES/TUBES: ETT 04/20 >4/21, 4/25-29  Procedures Head CT MRI brain 2-D echo Doppler lower extremities EEG   Anti-infectives    Start     Dose/Rate Route Frequency Ordered Stop   07/21/15 1200  Ampicillin-Sulbactam (UNASYN) 3 g in sodium chloride 0.9 % 100 mL IVPB  Status:  Discontinued     3 g 100 mL/hr over 60 Minutes Intravenous Every 6 hours 07/21/15 1113 07/24/15 1020   07/13/15 1800  vancomycin (VANCOCIN) IVPB 1000 mg/200 mL premix  Status:  Discontinued     1,000 mg 200 mL/hr over 60 Minutes Intravenous Every 12 hours 07/13/15 1757 07/15/15 1040   07/08/15 1800  vancomycin (VANCOCIN) IVPB 750 mg/150 ml premix  Status:  Discontinued     750 mg 150 mL/hr over 60 Minutes Intravenous Every 12 hours 07/08/15 0050 07/09/15 0942   07/08/15 0800  piperacillin-tazobactam (ZOSYN) IVPB 3.375 g     3.375 g 12.5 mL/hr over 240 Minutes Intravenous Every 8 hours 07/08/15 0050 07/15/15  2359   07/08/15 0100  vancomycin (VANCOCIN) IVPB 1000 mg/200 mL premix     1,000 mg 200 mL/hr over 60 Minutes Intravenous  Once 07/08/15 0050 07/08/15 0809   07/08/15 0100  piperacillin-tazobactam (ZOSYN) IVPB 3.375 g  Status:  Discontinued  3.375 g 100 mL/hr over 30 Minutes Intravenous  Once 07/08/15 0050 07/16/15 0750   07/08/15 0000  metroNIDAZOLE (FLAGYL) IVPB 500 mg     500 mg 100 mL/hr over 60 Minutes Intravenous  Once 07/14/2015 2359 07/08/15 1900   06/20/2015 2015  cefTRIAXone (ROCEPHIN) 1 g in dextrose 5 % 50 mL IVPB     1 g 100 mL/hr over 30 Minutes Intravenous  Once 07/06/2015 2012 07/04/2015 2152   07/14/2015 2015  azithromycin (ZITHROMAX) 500 mg in dextrose 5 % 250 mL IVPB     500 mg 250 mL/hr over 60 Minutes Intravenous  Once 07/06/2015 2012 06/26/2015 2318        Objective:   Filed Vitals:   07/24/15 1149 07/24/15 1150 07/24/15 1643 07/25/15 0545  BP:  118/101 162/108 140/92  Pulse:  102 115 122  Temp: 98.3 F (36.8 C)  98.6 F (37 C) 98.2 F (36.8 C)  TempSrc: Oral  Oral Oral  Resp:  7 20 19   Height:      Weight:      SpO2:  95% 97% 91%    Wt Readings from Last 3 Encounters:  07/24/15 64.5 kg (142 lb 3.2 oz)  04/15/15 68.539 kg (151 lb 1.6 oz)  03/13/15 75.6 kg (166 lb 10.7 oz)     Intake/Output Summary (Last 24 hours) at 07/25/15 2225 Last data filed at 07/25/15 1602  Gross per 24 hour  Intake      0 ml  Output   1800 ml  Net  -1800 ml     Physical Exam  Gen: sleeping, I did not attempt to arouse Chest: Diminished breath sounds bilaterally CVS: tachycardic GI: abdomen soft and nondistended MSK: left sided weakness Skin: pale, cool, dry    Data Review:  We have stopped checking labs.  Inpatient Medications  Scheduled Meds: . antiseptic oral rinse  7 mL Mouth Rinse q12n4p  . aspirin  300 mg Rectal Daily  . chlorhexidine  15 mL Mouth Rinse BID  . cloNIDine  0.1 mg Transdermal Weekly   Continuous Infusions:   PRN Meds:.sodium chloride,  bisacodyl, hydrALAZINE, LORazepam, morphine CONCENTRATE  Time Spent in minutes  25 minutes  Eber Stevick M.D on 07/25/2015 at 10:25 PM  [facility] -  Office  [phone]

## 2015-07-25 NOTE — Progress Notes (Signed)
Condom catheter keeps coming off, foley catheter placed for end of life care

## 2015-07-25 NOTE — Progress Notes (Signed)
Txt paged MD requesting po meds to be changed to iv d/t pt lethargy. Awaiting response

## 2015-07-25 NOTE — Clinical Social Work Note (Signed)
Clinical Social Work Assessment  Patient Details  Name: Benjamin Park MRN: [id number] Date of Birth: 10-31-1940  Date of referral:  07/24/15               Reason for consult:  End of Life/Hospice                Permission sought to share information with:  Family Supports Permission granted to share information::  Yes, Verbal Permission Granted  Name::     Haematologist::     Relationship::  spouse  Contact Information:     Housing/Transportation Living arrangements for the past 2 months:  Single Family Home Source of Information:  Spouse Patient Interpreter Needed:  None Criminal Activity/Legal Involvement Pertinent to Current Situation/Hospitalization:  No - Comment as needed Significant Relationships:  Spouse Lives with:  Spouse Do you feel safe going back to the place where you live?  No Need for family participation in patient care:  Yes (Comment)  Care giving concerns:  Wife feels she cannot care for pt at home.   Social Worker assessment / plan:  CSW received multiple calls re: DC planning. CSW met with pt and wife at bedside. Pt was asleep and did not participate during assessment. Wife spoke with CSW yesterday re: hospice referral. Wife was upset to hear that [facility] was unavailable and chose to decline bed offer from [facility] due to feeling it was too far away and friends and family would be unable to visit. Wife went home to prepare home in order to bring him home but once she returned to hospital today realized that he had declined and that she would be unable to care for him at home. Wife spoke with [facility] and accepted bed offer for 5/8. CSW spoke with MD and explained plans and MD agrees that pt will need inpatient hospice since wife is unable to provide care at home. CSW spoke with Harmon Pier from [facility] who will complete paperwork today in preparation for DC tomorrow.  CSW will leave handoff for weekday CSW.  Employment status:   Retired Nurse, adult PT Recommendations:  Not assessed at this time Information / Referral to community resources:  Other (Comment Required) (Hospice)  Patient/Family's Response to care:  Wife is hopeful for smooth transition to [facility].  Patient/Family's Understanding of and Emotional Response to Diagnosis, Current Treatment, and Prognosis:  Wife was upset and tearful during assessment. Wife agrees that pt has declined and needs hospice but feels that she is letting him down but not being able to care for him at home. CSW validated wife's feelings and encouraged her to allow [facility] staff to provide personal care so that she could be emotionally supportive to pt during this time.  Emotional Assessment Appearance:  Appears older than stated age Attitude/Demeanor/Rapport:  Unable to Assess Affect (typically observed):  Unable to Assess Orientation:  Fluctuating Orientation (Suspected and/or reported Sundowners) Alcohol / Substance use:  Not Applicable Psych involvement (Current and /or in the community):  No (Comment)  Discharge Needs  Concerns to be addressed:  No discharge needs identified Readmission within the last 30 days:  Yes Current discharge risk:  None Barriers to Discharge:  No Barriers Identified   Boone Master, [place] 07/25/2015, 10:16 AM Weekend Coverage

## 2015-07-26 DIAGNOSIS — R259 Unspecified abnormal involuntary movements: Secondary | ICD-10-CM

## 2015-07-26 MED ORDER — ACETAMINOPHEN 650 MG RE SUPP: 650.0000 mg | RECTAL | Status: DC | PRN

## 2015-08-19 NOTE — Consult Note (Signed)
[place] room is available for Benjamin Park this morning if transfer still makes sense. Spouse completed paper work to transfer yesterday. Dr. Orpah Melter to assume care per spouse's choice.   Please fax discharge summary to [phone].  RN please call report to [phone].  Thank  You.  Erling Conte, [place]

## 2015-08-19 NOTE — Progress Notes (Signed)
Nutrition Brief Note  Chart reviewed. Pt now transitioning to comfort care.  No further nutrition interventions warranted at this time.  Please re-consult as needed.   [place] Silveria A. Marlisha Vanwyk, RD, LDN, CDE Pager: [phone] After hours Pager: [phone]  

## 2015-08-19 NOTE — Discharge Summary (Signed)
Physician Discharge Summary  Benjamin Park MRN: [id number] DOB/AGE: 04-16-1940 75 y.o.  PCP: No primary care provider on file.   Admit date: 07-31-2015 Death date: 08/19/2015  Discharge Diagnoses:     Active Problems:   Sleep apnea   S/P patent foramen ovale closure   Abnormal involuntary movement   Hypertensive emergency   Acute encephalopathy   Encephalopathy acute   Frequent falls   Vomiting   History of TIA (transient ischemic attack)   Hypokalemia   Aspiration pneumonia (HCC)   Hypertensive urgency, malignant   Aspiration pneumonia of both lower lobes due to vomit ([place])   Confusion   Acute respiratory failure with hypoxemia ([place])   Encounter for palliative care   Goals of care, counseling/discussion   Dyspnea          Current Discharge Medication List    CONTINUE these medications which have NOT CHANGED   Details  Ascorbic Acid (VITAMIN C PO) Take 1 tablet by mouth daily.    aspirin 325 MG tablet Take 325 mg by mouth daily.    chlorthalidone (HYGROTON) 25 MG tablet Take 25 mg by mouth daily.    cloNIDine (CATAPRES) 0.1 MG tablet Take 0.1 mg by mouth as needed (When BP >160/95).    ENSURE (ENSURE) Take 237 mLs by mouth daily.    fish oil-omega-3 fatty acids 1000 MG capsule Take 1 g by mouth daily.     GLUCOSAMINE HCL PO Take 1 tablet by mouth daily. Reported on 04/15/2015    KLOR-CON M10 10 MEQ tablet Take 10 mEq by mouth daily.    labetalol (NORMODYNE) 100 MG tablet Take 1 tablet (100 mg total) by mouth 2 (two) times daily. Qty: 60 tablet, Refills: 1    levothyroxine (SYNTHROID, LEVOTHROID) 112 MCG tablet Take 112 mcg by mouth daily.     lovastatin (MEVACOR) 20 MG tablet Take 20 mg by mouth 2 (two) times daily.    Maltodextrin-Xanthan Gum (RESOURCE THICKENUP CLEAR) POWD Take 1 g by mouth as needed. Qty: 1 Can, Refills: 6    Multiple Vitamin (MULITIVITAMIN WITH MINERALS) TABS Take 1 tablet by mouth daily.              Significant Diagnostic  Studies:  Ct Angio Head W/cm &/or Wo Cm  07/15/2015  CLINICAL DATA:  75 year old male with widespread superior hemisphere restricted diffusion on brain MRI yesterday possibly related to emboli or severe posterior reversible encephalopathy syndrome. Initial encounter. EXAM: CT ANGIOGRAPHY HEAD AND NECK TECHNIQUE: Multidetector CT imaging of the head and neck was performed using the standard protocol during bolus administration of intravenous contrast. Multiplanar CT image reconstructions and MIPs were obtained to evaluate the vascular anatomy. Carotid stenosis measurements (when applicable) are obtained utilizing NASCET criteria, using the distal internal carotid diameter as the denominator. CONTRAST:  50 mL Isovue 370 COMPARISON:  Brain MRI 07/14/2015. Head CT 07/13/2015. Intracranial MRA 02/01/2014 cervical spine CT 04/09/2012. FINDINGS: CT HEAD Brain: Abnormal cortical hypodensity in the bilateral superior frontal gyri, and to a lesser extent parietal lobes, corresponding to the abnormal diffusion. No associated hemorrhage or mass effect. Stable underlying cerebral volume. Stable gray-white matter differentiation elsewhere since 07/13/2015. No ventriculomegaly. Calvarium and skull base: Stable, negative. Paranasal sinuses: Stable left maxillary sinus opacification. Orbits: Stable, negative. CTA NECK Skeleton: Osteopenia. Exaggerated cervical lordosis with congenital appearing bilateral C6 lamina defects. Mild C6 compression vertebral compression. The appearance is unchanged from 2014. No acute osseous abnormality identified. Other neck: Intubated and oral enteric tube in place.  Endotracheal tube tip terminates above the carina. Enteric tube courses within the thoracic esophagus. Moderate apical pulmonary scarring. Areas of centrilobular emphysema in the right upper lobe. No superior mediastinal lymphadenopathy. Diminutive or absent thyroid. Negative parapharyngeal and retropharyngeal spaces. Negative sublingual  space. Atrophied or surgically absent submandibular glands. There is also degree of bilateral parotid atrophy. No cervical lymphadenopathy. Aortic arch: 3 vessel arch configuration. Moderate soft and calcified arch atherosclerosis. Right carotid system: No brachiocephalic artery stenosis despite soft plaque. No right CCA origin stenosis. Mildly tortuous right CCA. Proximal to the right carotid bifurcation there is medial soft plaque in the right CCA resulting an less than 50 % stenosis with respect to the distal vessel. This appears to be mildly ulcerated (series 501, image 70). At the right carotid bifurcation there is soft and calcified plaque but no proximal right ICA stenosis. Soft and calcified plaque distal to the right ICA bulb without stenosis. The vessel remains patent to the skullbase. Left carotid system: No left CCA origin stenosis. Soft plaque proximal to the left carotid bifurcation without stenosis. Soft and calcified plaque at the bifurcation without proximal left ICA stenosis. The left ICA is patent to the skullbase. Vertebral arteries: No proximal right subclavian artery stenosis despite soft and calcified plaque. No definite stenosis at the right vertebral artery origin. Patent right vertebral artery to the skullbase without stenosis. Bulky soft plaque in the proximal left subclavian artery resulting an stenosis up to 65-70 % with respect to the distal vessel (series 501, image 27). The left subclavian remains patent. No stenosis at the left vertebral artery origin. Codominant vertebral arteries. No left vertebral artery stenosis to the skullbase. CTA HEAD Posterior circulation: Codominant distal vertebral arteries are patent without stenosis. Patent vertebrobasilar junction. Normal right PICA origin. Dominant appearing left AICA. Tortuous basilar artery without stenosis. Normal SCA and PCA origins. Normal bilateral PCA branches. Anterior circulation: Both ICA siphons are patent. There is soft  plaque suspected in much of the right petrous segment without hemodynamically significant stenosis. Mild calcified plaque. Mild cavernous segment tortuosity. No siphon stenosis. Normal ophthalmic artery origins. Patent carotid termini. Normal MCA origins. Normal ACA origins. The left A1 is dominant. Anterior communicating artery and visualized bilateral ACA branches are within normal limits. Left MCA M1 segment, bifurcation, and left MCA branches are within normal limits. Right MCA M1 segment, bifurcation, and right MCA branches are within normal limits. Venous sinuses: Patent. Anatomic variants: None. Delayed phase: No abnormal enhancement identified. IMPRESSION: 1. Negative for emergent large vessel occlusion. Widespread atherosclerotic plaque, but no hemodynamically significant carotid, vertebral, or intracranial artery stenosis. 2. 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 3. Expected CT appearance of the superior hemispheric diffusion positive lesions seen by MRI yesterday. No associated hemorrhage or mass effect. 4. No acute findings in the neck. Endotracheal tube terminates above the carina, enteric tube courses within the thoracic esophagus. Electronically Signed   By: Genevie Ann M.D.   On: 07/15/2015 11:36   Dg Abd 1 View  07/17/2015  CLINICAL DATA:  75 year old male status post NG tube placement. EXAM: ABDOMEN - 1 VIEW COMPARISON:  03/12/2015. FINDINGS: Nasogastric tube coiled in the proximal stomach. Gas is noted throughout the colon. Several nondilated gas-filled loops of small bowel are also noted. No gross evidence of pneumoperitoneum identified on this single supine view. IMPRESSION: 1. Tip of nasogastric tube is in the proximal stomach. Electronically Signed   By: Vinnie Langton M.D.   On: 07/17/2015 14:49  Ct Head Wo Contrast  07/13/2015  CLINICAL DATA:  Altered mental status EXAM: CT HEAD WITHOUT CONTRAST TECHNIQUE: Contiguous axial images were obtained  from the base of the skull through the vertex without intravenous contrast. COMPARISON:  07/08/2015 FINDINGS: Bony calvarium is intact. Diffuse atrophic changes are noted as well as chronic white matter ischemic change. The overall appearance is similar to that seen on recent MRI examination. No acute hemorrhage, acute infarction or space-occupying mass lesion is seen IMPRESSION: Chronic atrophic and ischemic changes are noted. No acute abnormality seen. Electronically Signed   By: Inez Catalina M.D.   On: 07/13/2015 19:21   Ct Head Wo Contrast  07/08/2015  CLINICAL DATA:  Change in mental status. EXAM: CT HEAD WITHOUT CONTRAST TECHNIQUE: Contiguous axial images were obtained from the base of the skull through the vertex without intravenous contrast. COMPARISON:  CT 5-1/2 hours prior.  Brain MRI 03/13/2015 FINDINGS: No change from prior exam. Advanced cerebral and cerebellar atrophy again seen. Advanced chronic small vessel ischemia. No evidence of developing infarct. No interval hemorrhage. Unchanged opacification of the left maxillary sinus, ethmoid air cell and frontal sinus to lesser extent. IMPRESSION: Advanced atrophy and chronic small vessel ischemia. No change from exam 5 hours prior. These results will be called to the ordering clinician or representative by the Radiologist Assistant, and communication documented in the PACS or zVision Dashboard. Electronically Signed   By: Jeb Levering M.D.   On: 07/08/2015 00:52   Ct Head Wo Contrast  07/06/2015  CLINICAL DATA:  Acute onset of confusion and aphasia.  Code stroke. EXAM: CT HEAD WITHOUT CONTRAST TECHNIQUE: Contiguous axial images were obtained from the base of the skull through the vertex without intravenous contrast. COMPARISON:  03/13/2015 FINDINGS: The brain shows advanced generalized atrophy. There are extensive chronic small vessel ischemic changes throughout the white matter. No identifiable acute infarction, mass lesion, hemorrhage,  hydrocephalus or extra-axial collection. No calvarial abnormality. Sinuses are clear except for the left maxillary sinus which is opacified. There is atherosclerotic calcification of the major vessels at the base of the brain. IMPRESSION: No acute finding by CT. Advanced generalized brain atrophy and chronic small vessel ischemic changes. These results were called by telephone at the time of interpretation on 07/06/2015 at 6:54 pm to Dr. Gareth Morgan , who verbally acknowledged these results. Electronically Signed   By: Nelson Chimes M.D.   On: 07/01/2015 18:57   Ct Angio Neck W/cm &/or Wo/cm  07/15/2015  CLINICAL DATA:  75 year old male with widespread superior hemisphere restricted diffusion on brain MRI yesterday possibly related to emboli or severe posterior reversible encephalopathy syndrome. Initial encounter. EXAM: CT ANGIOGRAPHY HEAD AND NECK TECHNIQUE: Multidetector CT imaging of the head and neck was performed using the standard protocol during bolus administration of intravenous contrast. Multiplanar CT image reconstructions and MIPs were obtained to evaluate the vascular anatomy. Carotid stenosis measurements (when applicable) are obtained utilizing NASCET criteria, using the distal internal carotid diameter as the denominator. CONTRAST:  50 mL Isovue 370 COMPARISON:  Brain MRI 07/14/2015. Head CT 07/13/2015. Intracranial MRA 02/01/2014 cervical spine CT 04/09/2012. FINDINGS: CT HEAD Brain: Abnormal cortical hypodensity in the bilateral superior frontal gyri, and to a lesser extent parietal lobes, corresponding to the abnormal diffusion. No associated hemorrhage or mass effect. Stable underlying cerebral volume. Stable gray-white matter differentiation elsewhere since 07/13/2015. No ventriculomegaly. Calvarium and skull base: Stable, negative. Paranasal sinuses: Stable left maxillary sinus opacification. Orbits: Stable, negative. CTA NECK Skeleton: Osteopenia. Exaggerated cervical lordosis with  congenital appearing bilateral C6 lamina defects. Mild C6 compression vertebral compression. The appearance is unchanged from 2014. No acute osseous abnormality identified. Other neck: Intubated and oral enteric tube in place. Endotracheal tube tip terminates above the carina. Enteric tube courses within the thoracic esophagus. Moderate apical pulmonary scarring. Areas of centrilobular emphysema in the right upper lobe. No superior mediastinal lymphadenopathy. Diminutive or absent thyroid. Negative parapharyngeal and retropharyngeal spaces. Negative sublingual space. Atrophied or surgically absent submandibular glands. There is also degree of bilateral parotid atrophy. No cervical lymphadenopathy. Aortic arch: 3 vessel arch configuration. Moderate soft and calcified arch atherosclerosis. Right carotid system: No brachiocephalic artery stenosis despite soft plaque. No right CCA origin stenosis. Mildly tortuous right CCA. Proximal to the right carotid bifurcation there is medial soft plaque in the right CCA resulting an less than 50 % stenosis with respect to the distal vessel. This appears to be mildly ulcerated (series 501, image 70). At the right carotid bifurcation there is soft and calcified plaque but no proximal right ICA stenosis. Soft and calcified plaque distal to the right ICA bulb without stenosis. The vessel remains patent to the skullbase. Left carotid system: No left CCA origin stenosis. Soft plaque proximal to the left carotid bifurcation without stenosis. Soft and calcified plaque at the bifurcation without proximal left ICA stenosis. The left ICA is patent to the skullbase. Vertebral arteries: No proximal right subclavian artery stenosis despite soft and calcified plaque. No definite stenosis at the right vertebral artery origin. Patent right vertebral artery to the skullbase without stenosis. Bulky soft plaque in the proximal left subclavian artery resulting an stenosis up to 65-70 % with respect to  the distal vessel (series 501, image 27). The left subclavian remains patent. No stenosis at the left vertebral artery origin. Codominant vertebral arteries. No left vertebral artery stenosis to the skullbase. CTA HEAD Posterior circulation: Codominant distal vertebral arteries are patent without stenosis. Patent vertebrobasilar junction. Normal right PICA origin. Dominant appearing left AICA. Tortuous basilar artery without stenosis. Normal SCA and PCA origins. Normal bilateral PCA branches. Anterior circulation: Both ICA siphons are patent. There is soft plaque suspected in much of the right petrous segment without hemodynamically significant stenosis. Mild calcified plaque. Mild cavernous segment tortuosity. No siphon stenosis. Normal ophthalmic artery origins. Patent carotid termini. Normal MCA origins. Normal ACA origins. The left A1 is dominant. Anterior communicating artery and visualized bilateral ACA branches are within normal limits. Left MCA M1 segment, bifurcation, and left MCA branches are within normal limits. Right MCA M1 segment, bifurcation, and right MCA branches are within normal limits. Venous sinuses: Patent. Anatomic variants: None. Delayed phase: No abnormal enhancement identified. IMPRESSION: 1. Negative for emergent large vessel occlusion. Widespread atherosclerotic plaque, but no hemodynamically significant carotid, vertebral, or intracranial artery stenosis. 2. 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 3. Expected CT appearance of the superior hemispheric diffusion positive lesions seen by MRI yesterday. No associated hemorrhage or mass effect. 4. No acute findings in the neck. Endotracheal tube terminates above the carina, enteric tube courses within the thoracic esophagus. Electronically Signed   By: Genevie Ann M.D.   On: 07/15/2015 11:36   Mr Brain Wo Contrast  07/14/2015  CLINICAL DATA:  Hypertension and confusion, acute onset. EXAM: MRI HEAD  WITHOUT CONTRAST TECHNIQUE: Multiplanar, multiecho pulse sequences of the brain and surrounding structures were obtained without intravenous contrast. COMPARISON:  Head CT 07/13/2015. MRI 07/08/2015. Multiple previous neuro imaging studies. FINDINGS: There are multiple newly seen areas  of brain cortical and subcortical edema with mild swelling associated largely with restricted diffusion. These are most prominent along the surface of the brain running from front back at the vertex, right more than left. Chronic changes of brain atrophy an old small vessel insults within the brainstem, cerebellum and cerebral hemispheric white matter are again noted. Chronic abnormality of the corpus callosum is unchanged. I do not see any evidence of hemorrhage. No mass effect. No hydrocephalus. No extra-axial collection. No pituitary mass. There are inflammatory changes affecting the left maxillary sinus. IMPRESSION: Newly seen extensive areas of restricted diffusion and brain edema in a patchy pattern primarily at the vertices, right more than left. Differential diagnosis is that of an atypical manifestation of posterior reversible encephalopathy/hypertensive encephalopathy versus is embolic infarctions. Due to the history of multiple presentations in the past during episodes of severe hypertension, posterior reversible encephalopathy is favored. Electronically Signed   By: Nelson Chimes M.D.   On: 07/14/2015 19:18   Mr Brain Wo Contrast  07/08/2015  CLINICAL DATA:  Aphasia and change in mental status. Symptoms began and yesterday. The patient then became unresponsive and was intubated. EXAM: MRI HEAD WITHOUT CONTRAST TECHNIQUE: Multiplanar, multiecho pulse sequences of the brain and surrounding structures were obtained without intravenous contrast. COMPARISON:  CT head without contrast 07/08/2015. MRI brain 01/11/2015. FINDINGS: Advanced atrophy and diffuse white matter changes are evident bilaterally. There is diffuse white  matter change within both the splenium and the anterior genu of the corpus callosum. Diffuse atrophy of the corpus callosum is noted. These findings are similar the prior study. Increased T2 signal is present within these cerebellar peduncle is bilaterally. Focal susceptibility within the dentate nuclei is stable. There is significant T2 shine through on the diffusion weighted images, similar to the prior exams. No focal restricted diffusion is present on the ADC map sub to suggest acute or subacute infarction. No acute hemorrhage or mass lesion is present. The ventricles are proportionate to the degree of atrophy and stable since the prior study. No significant extra-axial fluid collection is present. Flow is present in the major intracranial arteries. The globes and orbits are intact. Chronic left maxillary sinus disease is present with near complete opacification of the left maxillary sinus. Asymmetric opacification of ethmoid air cells is noted on the left. Mild mucosal thickening is present in the sphenoid sinuses and frontal sinuses. There is some fluid in the mastoid air cells bilaterally without an obstructing nasopharyngeal lesion. Midline sagittal images demonstrate no focal lesions. Moderate degenerative changes are noted in the upper cervical spine, similar to the prior study. IMPRESSION: 1. No acute intracranial abnormality or significant interval change. 2. Advanced atrophy and diffuse white matter disease bilaterally. Extensive subcortical white matter changes bilaterally are likely related to the remote ischemic disease. These findings are stable. 3. 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 Electronically Signed   By: San Morelle M.D.   On: 07/08/2015 17:05   Dg Chest Port 1 View  07/21/2015  CLINICAL DATA:  Acute onset shortness of breath earlier today EXAM: PORTABLE CHEST 1 VIEW COMPARISON:  July 17, 2015 FINDINGS: There is persistent patchy opacity in the left base, likely  atelectasis with minimal left effusion. Lungs elsewhere clear. Heart size and pulmonary vascularity are normal. No adenopathy. Nasogastric tube tip and side port are in the stomach. Patient has had to foramen ovale closure with closure device apparent. IMPRESSION: Probable atelectasis in the left base. A small focus of left base pneumonia is possible,  however. No new opacity is apparent in this area compared to recent prior study. Lungs elsewhere clear. Stable cardiac silhouette. Nasogastric tube tip and side port are in the stomach. Electronically Signed   By: Lowella Grip III M.D.   On: 07/21/2015 08:16   Dg Chest Port 1 View  07/17/2015  CLINICAL DATA:  Patient with acute respiratory failure. EXAM: PORTABLE CHEST 1 VIEW COMPARISON:  Chest radiograph 07/15/2015. FINDINGS: Patient is rotated to the left. Interval extubation and removal of enteric tube. Stable enlarged cardiac and mediastinal contours. Small left pleural effusion. Underlying airspace opacities favored to represent atelectasis. Cardiac septal occluder device. IMPRESSION: Interval extubation and removal of enteric tube. Small left pleural effusion and underlying opacities favored to represent atelectasis, stable. Electronically Signed   By: Lovey Newcomer M.D.   On: 07/17/2015 07:47   Dg Chest Port 1 View  07/15/2015  CLINICAL DATA:  Ventilator patient.  Acute respiratory failure. EXAM: PORTABLE CHEST 1 VIEW COMPARISON:  07/14/2015 and 07/13/2015. FINDINGS: 0721 hours. The endotracheal and nasogastric tubes appear unchanged. The heart size and mediastinal contours are stable. Cardiac septal defect occluder device noted. The overall pulmonary aeration has improved. There is residual patchy left lower lobe airspace disease. No significant pleural effusion or pneumothorax. IMPRESSION: Overall improving aeration of the lungs with residual probable left lower lobe atelectasis. Electronically Signed   By: Richardean Sale M.D.   On: 07/15/2015 07:37    Dg Chest Port 1 View  07/14/2015  CLINICAL DATA:  Acute respiratory failure EXAM: PORTABLE CHEST 1 VIEW COMPARISON:  07/13/15 FINDINGS: Endotracheal tube and nasogastric catheter are again identified and stable in appearance. Patchy changes are again noted in the bases bilaterally left greater than right. No new focal abnormality is seen. IMPRESSION: Stable appearance when compared with the prior exam. Electronically Signed   By: Inez Catalina M.D.   On: 07/14/2015 07:38   Dg Chest Port 1 View  07/13/2015  CLINICAL DATA:  Check endotracheal tube placement EXAM: PORTABLE CHEST 1 VIEW COMPARISON:  07/11/2015 FINDINGS: Cardiac shadow is within normal limits. The lungs are well aerated bilaterally. Patchy infiltrates are again seen particularly in the left mid lung and left base. Some suggestion of early infiltrate is noted in the right medial lung base. A nasogastric catheter is seen in the stomach. The endotracheal tube lies in the trachea approximately 4 cm above the carina. No bony abnormality is seen. IMPRESSION: Endotracheal tube and nasogastric catheter as described. Patchy infiltrative changes left greater than right. Electronically Signed   By: Inez Catalina M.D.   On: 07/13/2015 18:40   Dg Chest Port 1 View  07/11/2015  CLINICAL DATA:  Hypertension, coronary artery disease EXAM: PORTABLE CHEST 1 VIEW COMPARISON:  07/09/2015 FINDINGS: Interval extubation. RIGHT central venous line remains. Normal cardiac silhouette. There is LEFT lower lobe airspace disease not changed. No pneumothorax. IMPRESSION: 1. Interval extubation without complication. 2. Persistent LEFT lower lobe airspace opacity. Electronically Signed   By: Suzy Bouchard M.D.   On: 07/11/2015 09:04   Dg Chest Port 1 View  07/09/2015  CLINICAL DATA:  Aspiration pneumonia, intubated patient, history of TIA. EXAM: PORTABLE CHEST 1 VIEW COMPARISON:  Portable chest x-ray of July 08, 2015. FINDINGS: The right lung is adequately inflated and  clear. On the left there is persistent retrocardiac and lower lobe density slightly more conspicuous today. The cardiac silhouette is normal in size. The pulmonary vascularity is not engorged. The endotracheal tube tip lies approximately 3.9 cm above the  carina. The esophagogastric tube tip projects below the inferior margin of the image. The right internal jugular venous catheter tip projects over the midportion of the SVC. IMPRESSION: Slight interval increase in left lower lobe parenchymal density consistent with aspiration pneumonia. The right lung is clear. There is no pulmonary edema. The support tubes are in reasonable position. Electronically Signed   By: David  Martinique M.D.   On: 07/09/2015 07:27   Dg Chest Port 1 View  07/08/2015  CLINICAL DATA:  Central line placement. EXAM: PORTABLE CHEST 1 VIEW COMPARISON:  Earlier film, same date. FINDINGS: The right IJ center venous catheter tip is in the distal SVC near the cavoatrial junction. The endotracheal tube and NG tubes are stable. The heart and lungs are relatively stable. Slight improved aeration in the left lower lobe. IMPRESSION: Right IJ center venous catheter tip is in the distal SVC near the cavoatrial junction. No complicating features. The other support apparatus is stable. Slight improved left lower lobe aeration. Electronically Signed   By: Marijo Sanes M.D.   On: 07/08/2015 10:51   Dg Chest Portable 1 View  07/08/2015  CLINICAL DATA:  Post intubation EXAM: PORTABLE CHEST 1 VIEW COMPARISON:  06/26/2015 FINDINGS: Interval placement of an endotracheal tube with tip measuring 3.6 cm above the carina. Enteric tube tip is not visualized but is below the left hemidiaphragm, likely in the distal stomach. Borderline heart size with normal pulmonary vascularity. Focal airspace consolidation in the left mid lung as before. Developing consolidation in the right upper lung. No blunting of costophrenic angles. No pneumothorax. Calcification of the aorta.  IMPRESSION: Appliances appear in satisfactory position. Focal consolidation in the left mid lung with developing consolidation in the right upper lung. Electronically Signed   By: Lucienne Capers M.D.   On: 07/08/2015 01:12   Dg Chest Port 1 View  06/27/2015  CLINICAL DATA:  Pneumonia.  Shortness of breath. EXAM: PORTABLE CHEST 1 VIEW COMPARISON:  Earlier same day.  04/15/2015. FINDINGS: Compared to the study of only 4 hours ago, there is rapidly worsening infiltrate in the left lower lobe. This raises the possibility of aspiration. Right chest appears clear in this projection. Presumed Watchman device projects over the heart. IMPRESSION: Development of rapid airspace filling in the left lower lobe consistent with pneumonia. Rapid development suggests possible aspiration. Electronically Signed   By: Nelson Chimes M.D.   On: 06/24/2015 23:49   Dg Chest Portable 1 View  07/06/2015  CLINICAL DATA:  Code stroke.  Altered mental status. EXAM: PORTABLE CHEST 1 VIEW COMPARISON:  04/15/2015 FINDINGS: The patient has taken a poor inspiration. The heart is at the upper limits of normal in size. There is calcification of the thoracic aorta. Accentuated markings at the lung bases could relate to the poor inspiration. Mild basilar pneumonia not excluded on this portable exam. The upper lungs are clear. No evidence of heart failure or effusion. IMPRESSION: Poor inspiration. Prominent markings at the bases that could relate to the poor inspiration or could be secondary to basilar pneumonia. Electronically Signed   By: Nelson Chimes M.D.   On: 06/19/2015 19:45   Dg Abd Portable 1v  07/18/2015  CLINICAL DATA:  Tube placement EXAM: PORTABLE ABDOMEN - 1 VIEW COMPARISON:  07/17/2015 FINDINGS: The enteric tube extends into the stomach with tip in the region of the gastric fundus. IMPRESSION: Enteric tube extends into the stomach. Electronically Signed   By: Andreas Newport M.D.   On: 07/18/2015 23:28   Dg Swallowing  Func-speech Pathology  07/12/2015  Objective Swallowing Evaluation: Type of Study: MBS-Modified Barium Swallow Study Patient Details Name: JACKEY AXLINE MRN: [id number] Date of Birth: 1940-11-15 Today's Date: 07/12/2015 Time: SLP Start Time (ACUTE ONLY): 1254-SLP Stop Time (ACUTE ONLY): 1307 SLP Time Calculation (min) (ACUTE ONLY): 13 min Past Medical History: Past Medical History Diagnosis Date . Hx of transient ischemic attack (TIA)  . Hyperlipidemia  . ED (erectile dysfunction)  . Personal history of kidney stones  . Subarachnoid hemorrhage ([place])    '11- hospitalized -3 to 4 days(passed out-awoke in hospital)- no residual affects. . Myocardial infarction ([place])    12/1991 . Dysphagia  . Hypokalemia  . Hypothyroidism  . Sleep apnea    on CPAP-settings 6 . History of kidney stones 02-27-13   x5- none recent . Throat cancer [facility])    '00-radiation therapy . Stroke ([place])  . Unspecified cerebral artery occlusion with cerebral infarction 11/08/2012 . Episodic confusion 02-27-13   hx. of, not currently(multiple episodes- ? TIA's)-always saw MD to be evaluated. Marland Kitchen AKI (acute kidney injury) ([place]) 02/23/2011   pt. denies any problems 02-27-13 . Hypertension    hx. labile - with spikes every 3-6 mos.-last 8'14 ED visit. Marland Kitchen Hypertensive encephalopathy  . Headache(784.0)    episodic with periods of confusion -multiple . Neuromuscular disorder ([place])    "parkinson's" -no tremor-sees neurology MD at [facility] . Coronary artery disease    Dr. Martinique Past Surgical History: Past Surgical History Procedure Laterality Date . Angioplasty  1993   LAD . Cardiac catheterization   . Coronary angioplasty   . Basal cell cancer     head . Hernia repair   . Appendectomy   . Patent foramen ovale closure  2008 . Eye surgery   . Colonoscopy N/A 06/18/2012   Procedure: COLONOSCOPY;  Surgeon: Jeryl Columbia, MD;  Location: [facility] ENDOSCOPY;  Service: Endoscopy;  Laterality: N/A;  h&p in file-[place]  . Hot hemostasis N/A 06/18/2012   Procedure: HOT HEMOSTASIS (ARGON  PLASMA COAGULATION/BICAP);  Surgeon: Jeryl Columbia, MD;  Location: [facility] ENDOSCOPY;  Service: Endoscopy;  Laterality: N/A; . Colonoscopy with propofol N/A 03/07/2013   Procedure: COLONOSCOPY WITH PROPOFOL;  Surgeon: Jeryl Columbia, MD;  Location: [facility] ENDOSCOPY;  Service: Endoscopy;  Laterality: N/A; HPI: 75 y.o. male with PMH as outlined below including uncontrolled hypertension for which he has had multiple admissions. He was brought to [facility] ED 04/19 evening by his wife who informed staff that he was having a stroke due to the fact that he was unable to follow commands and had gargled speech. He has hx of hemorrhagic stroke and apparently presented very similarly at the time. He was last seen normal around 1pm earlier that afternoon. Wife stated that he never had any focal deficits, only gargled speech and inability follow commands.  Subjective: pt alert, pleasant, HOH, says he coughs all the time when eating and drinking Assessment / Plan / Recommendation CHL IP CLINICAL IMPRESSIONS 07/12/2015 Therapy Diagnosis Mild oral phase dysphagia;Severe pharyngeal phase dysphagia Clinical Impression Pt has a mild oral and severe pharyngeal dysphagia, which appears only mildly exacerbated from baseline swallowing function when compared to prior MBS studies. Oral phase is discoordinated with poor bolus cohesion and prolonged transit time, but ultimately with good clearance. He has a delay in swallow trigger with all consistencies reaching the pyriform sinuses, which results in aspiration of honey thick liquids and purees. Aspiration is intermittently sensed, with delayed and inconsistent coughing not entirely effective at clearing aspirates. With  Max cues for a chin tuck, he is able to protect his airway with purees and Dys 2 textures. Mild residue remains in the valleculae, although it is not significantly changed with more solid boluses. Recommend Dys 2 diet and pudding thick liquids with full supervision for use of chin tuck.  Impact on safety and function Moderate aspiration risk;Severe aspiration risk   CHL IP TREATMENT RECOMMENDATION 07/12/2015 Treatment Recommendations Therapy as outlined in treatment plan below   Prognosis 07/12/2015 Prognosis for Safe Diet Advancement Good Barriers to Reach Goals Cognitive deficits;Severity of deficits;Time post onset Barriers/Prognosis Comment -- CHL IP DIET RECOMMENDATION 07/12/2015 SLP Diet Recommendations Dysphagia 2 (Fine chop) solids;Pudding thick liquid Liquid Administration via Spoon Medication Administration Crushed with puree Compensations Minimize environmental distractions;Slow rate;Small sips/bites;Chin tuck Postural Changes Remain semi-upright after after feeds/meals (Comment);Seated upright at 90 degrees   CHL IP OTHER RECOMMENDATIONS 07/12/2015 Recommended Consults -- Oral Care Recommendations Oral care BID Other Recommendations Order thickener from pharmacy;Prohibited food (jello, ice cream, thin soups);Remove water pitcher   CHL IP FOLLOW UP RECOMMENDATIONS 07/12/2015 Follow up Recommendations (No Data)   CHL IP FREQUENCY AND DURATION 07/12/2015 Speech Therapy Frequency (ACUTE ONLY) min 2x/week Treatment Duration 2 weeks      CHL IP ORAL PHASE 07/12/2015 Oral Phase Impaired Oral - Pudding Teaspoon -- Oral - Pudding Cup -- Oral - Honey Teaspoon Decreased bolus cohesion;Delayed oral transit Oral - Honey Cup -- Oral - Nectar Teaspoon -- Oral - Nectar Cup -- Oral - Nectar Straw -- Oral - Thin Teaspoon -- Oral - Thin Cup -- Oral - Thin Straw -- Oral - Puree Decreased bolus cohesion;Delayed oral transit Oral - Mech Soft Decreased bolus cohesion;Delayed oral transit Oral - Regular -- Oral - Multi-Consistency -- Oral - Pill -- Oral Phase - Comment --  CHL IP PHARYNGEAL PHASE 07/12/2015 Pharyngeal Phase Impaired Pharyngeal- Pudding Teaspoon -- Pharyngeal -- Pharyngeal- Pudding Cup -- Pharyngeal -- Pharyngeal- Honey Teaspoon Delayed swallow initiation-pyriform sinuses;Reduced anterior laryngeal  mobility;Reduced laryngeal elevation;Reduced tongue base retraction;Penetration/Aspiration before swallow;Pharyngeal residue - valleculae;Compensatory strategies attempted (with notebox) Pharyngeal Material enters airway, passes BELOW cords without attempt by patient to eject out (silent aspiration) Pharyngeal- Honey Cup -- Pharyngeal -- Pharyngeal- Nectar Teaspoon -- Pharyngeal -- Pharyngeal- Nectar Cup -- Pharyngeal -- Pharyngeal- Nectar Straw -- Pharyngeal -- Pharyngeal- Thin Teaspoon -- Pharyngeal -- Pharyngeal- Thin Cup -- Pharyngeal -- Pharyngeal- Thin Straw -- Pharyngeal -- Pharyngeal- Puree Delayed swallow initiation-pyriform sinuses;Reduced anterior laryngeal mobility;Reduced laryngeal elevation;Reduced tongue base retraction;Penetration/Aspiration before swallow;Pharyngeal residue - valleculae;Compensatory strategies attempted (with notebox) Pharyngeal Material enters airway, passes BELOW cords without attempt by patient to eject out (silent aspiration) Pharyngeal- Mechanical Soft Delayed swallow initiation-pyriform sinuses;Reduced anterior laryngeal mobility;Reduced laryngeal elevation;Reduced tongue base retraction;Pharyngeal residue - valleculae;Compensatory strategies attempted (with notebox) Pharyngeal Material does not enter airway Pharyngeal- Regular -- Pharyngeal -- Pharyngeal- Multi-consistency -- Pharyngeal -- Pharyngeal- Pill -- Pharyngeal -- Pharyngeal Comment --  CHL IP CERVICAL ESOPHAGEAL PHASE 07/12/2015 Cervical Esophageal Phase Impaired Pudding Teaspoon -- Pudding Cup -- Honey Teaspoon Esophageal backflow into cervical esophagus Honey Cup -- Nectar Teaspoon -- Nectar Cup -- Nectar Straw -- Thin Teaspoon -- Thin Cup -- Thin Straw -- Puree Esophageal backflow into cervical esophagus Mechanical Soft Esophageal backflow into cervical esophagus Regular -- Multi-consistency -- Pill -- Cervical Esophageal Comment -- No flowsheet data found. [name], M.A. CCC-SLP [phone] [name] 07/12/2015, 1:44 PM                 Filed Weights   07/22/15  0500 07/23/15 0400 07/24/15 0411  Weight: 65.9 kg (145 lb 4.5 oz) 65.7 kg (144 lb 13.5 oz) 64.5 kg (142 lb 3.2 oz)     Microbiology: No results found for this or any previous visit (from the past 240 hour(s)).     Blood Culture    Component Value Date/Time   SDES TRACHEAL ASPIRATE 07/08/2015 0242   SPECREQUEST Normal 07/08/2015 0242   CULT  07/08/2015 0242    MODERATE KLEBSIELLA PNEUMONIAE MODERATE PSEUDOMONAS AERUGINOSA Performed at [facility] 07/12/2015 FINAL 07/08/2015 0242      Labs: Results for orders placed or performed during the hospital encounter of 07/04/2015 (from the past 48 hour(s))  Glucose, capillary     Status: Abnormal   Collection Time: 07/24/15 11:52 AM  Result Value Ref Range   Glucose-Capillary 134 (H) 65 - 99 mg/dL     Lipid Panel     Component Value Date/Time   CHOL 119 07/16/2015 0254   TRIG 121 07/16/2015 0254   HDL 26* 07/16/2015 0254   CHOLHDL 4.6 07/16/2015 0254   VLDL 24 07/16/2015 0254   LDLCALC 69 07/16/2015 0254     Lab Results  Component Value Date   HGBA1C 5.6 07/16/2015   HGBA1C 5.5 03/15/2015   HGBA1C 5.4 02/01/2014     Lab Results  Component Value Date   LDLCALC 69 07/16/2015   CREATININE 1.21 07/23/2015      75 year old male with history of uncontrolled hypertension, history of TIAs and stroke, frequent falls, sleep apnea, history of subarachnoid hemorrhage, MI with angioplasty, dysphagia, hypothyroidism, neuromuscular disorder, hyperlipidemia was brought to the hospital on 4/19 after he had slurred speech and unable to follow commands. He had history of hemorrhagic stroke previously with similar presentation. In the ED he had elevated blood pressure in the low 200s and was started on a new nicardipine drip but rate showed his blood pressure dropped into low 100s. Head CT was unremarkable for acute infarct or hemorrhage.  Neurology evaluated the patient in the ED and while he was then he had acute change in mental status and became minimally responsive. Chest x-ray showed new left lower lobe infiltrate concerning for aspiration.  he was admitted to the ICU intubated. He was then transferred to medical floor on 4/21 after being successfully extubated. Started to improve and was planned for discharge on 4/25 but desaturated again with concern for repeat aspiration, became lethargic and blood pressure again elevated to 123456 systolic. Patient was again intubated for airway protection and transferred to ICU. Patient was then found to have left-sided hemiparesis. MRI was done which upon review by stroke team suggested to have bilateral frontal MCA, ACA infarct of embolic pattern. EEG done showed triphasic wave without seizure activity. Patient was extubated on 4/29. Patient continues to have left-sided weakness and failed swallow. Transferred to hospitalist service on 5/1.  On 5/3 patient went into respiratory distress and hypertensive. Chest x-ray showed atelectasis. Possibly had a mucous plug and aspiration. Required NRB and Transfer to stepdown unit.  Transferred to the palliative unit 07/24/2015. DNR status. Awaiting transfer to inpatient hospice facility. Patient's wife has become more accepting of prognosis, plan of care over the weekend. Palliative care consult was obtained during this admission. Subjective:  Patient deceased prior to being evaluated on 5/8  Assessment & Plan :   Principal problem Acute hypoxic respiratory failure Secondary to recurrent aspiration pneumonia. Required Intubation twice during hospital course. Completed antibiotic course on 4/28. Given recurrent  aspiration on 5/3 placed him on Unasyn. failed swallow evaluation recurrently.Started him on NG feeds on 5/2, held due to aspiration and respiratory distress. -Patient went into respiratory distress again on 5/3, Likely due to  aspiration and mucous plug.  --Actively declining. Comfort measures in place.  Active Problems: Acute encephalopathy Likely a combination of aspiration pneumonia, malignant hypertension and CVA -Needs Aggressive blood pressure control. appears that his mental status deteriorates every time his blood pressure increases. MRI suggests pt might have PRES but neurology thinks he has stroke. --No tube feedings --Clonidine patch for BP control  Acute bilateral frontal embolic stroke Source unclear. 2-D echo with normal EF and no cardiac source of emboli. Lower extremity Doppler negative for DVT. On full dose aspirin and statin (held as patient nothing by mouth) Now anticipating transition to hospice.  Uncontrolled hypertension with hypertensive emergency Clonidine patch for primary blood pressure control. IV PRN meds will not be continued at hospice.  Hypothyroidism Stopped synthroid due to end of life care.  Severe dysphagia Becoming too weak to swallow; wife is aware.Anticipated hospital   Disposition-this discharge summary is being completed after patient's death            Blood pressure 80/48, pulse 133, temperature 102.7 F (39.3 C), temperature source Axillary, resp. rate 23, height 6' (1.829 m), weight 64.5 kg (142 lb 3.2 oz), SpO2 84 %.      Follow-up Information    Follow up with Peter Martinique, MD. Schedule an appointment as soon as possible for a visit in 2 weeks.   Specialty:  Cardiology   Contact information:   [address] [place] [address] [phone]       Follow up with [facility].   Why:  For home health nursing and physical therapy. They will call in the next 1-2 days and schedule your first home visit.   Contact information:   [address] [place] [address] [phone]       Follow up with Xu,Jindong, MD. Schedule an appointment as soon as possible for a visit in 2 months.   Specialty:   Neurology   Why:  stroke clinic   Contact information:   [address] [place] [address] [phone]       Signed: Reyne Dumas 08-25-15, 9:55 AM        Time spent >45 mins

## 2015-08-19 DEATH — deceased

## 2016-06-01 IMAGING — CT CT HEAD W/O CM
2 series · 15 of 30 positions shown, 19 images · non-contrast
Comparison: 03/13/2015

CLINICAL DATA: Acute onset of confusion and aphasia.  Code stroke.

EXAM:
CT HEAD WITHOUT CONTRAST
TECHNIQUE: Contiguous axial images were obtained from the base of the skull
through the vertex without intravenous contrast.

[Series 201: head w/o, idose (1) · axial · non-contrast · 0.44mm/px · z∈[+99,+229]mm · 13 of 32 slices shown, 17 images]
[im 3/32  brain]
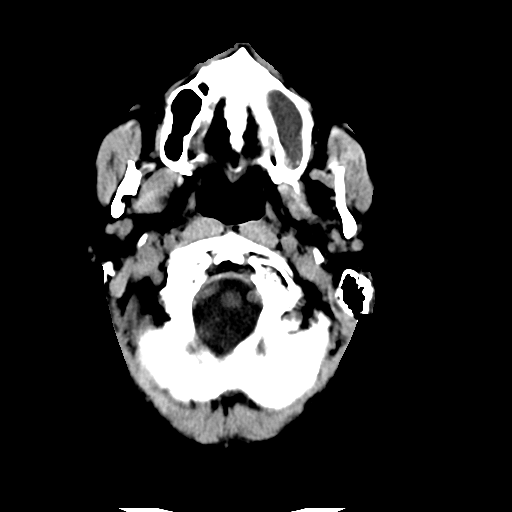
[im 3/32  bone]
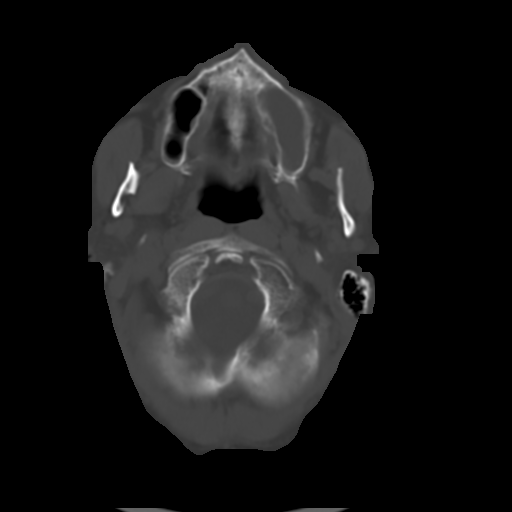
[im 5/32  brain]
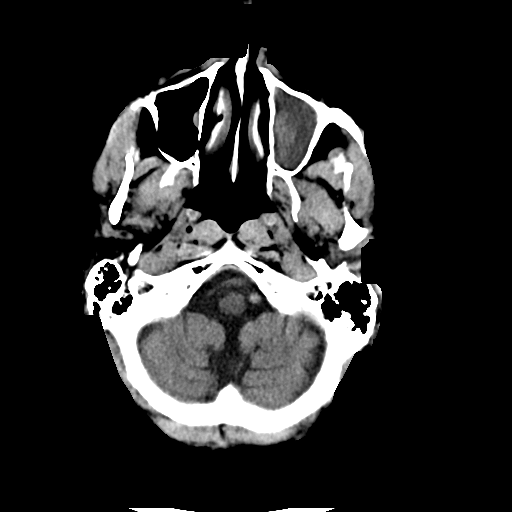
[im 7/32  brain]
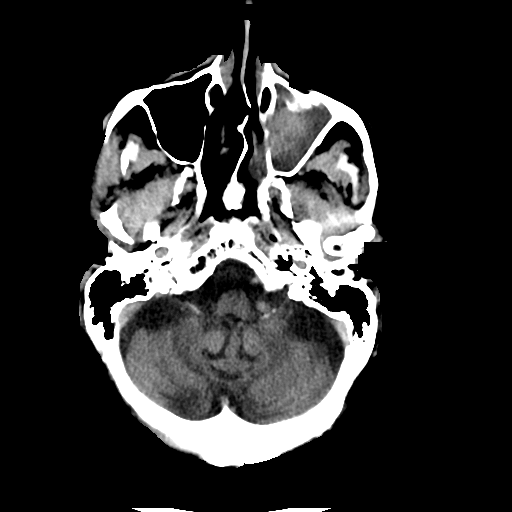
[im 9/32  brain]
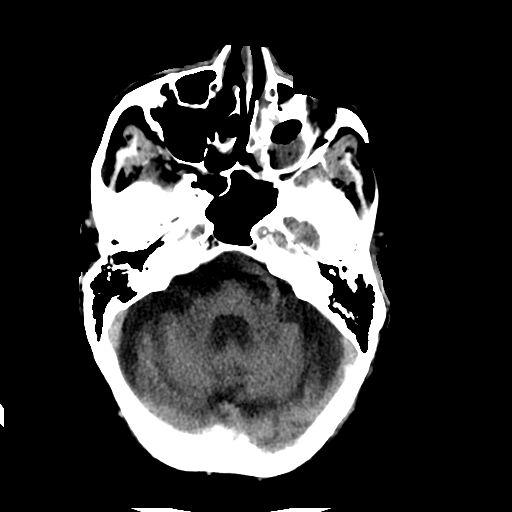
[im 12/32  brain]
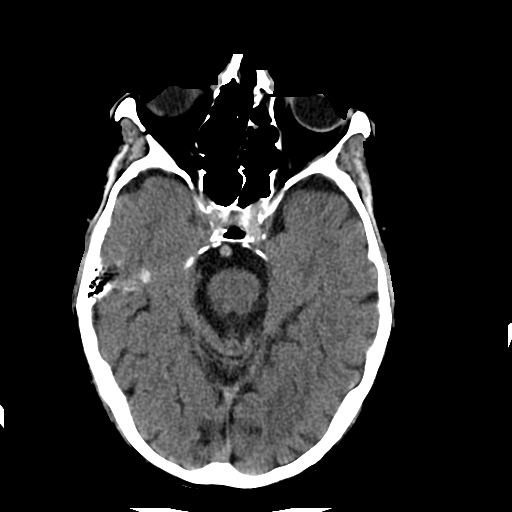
[im 12/32  bone]
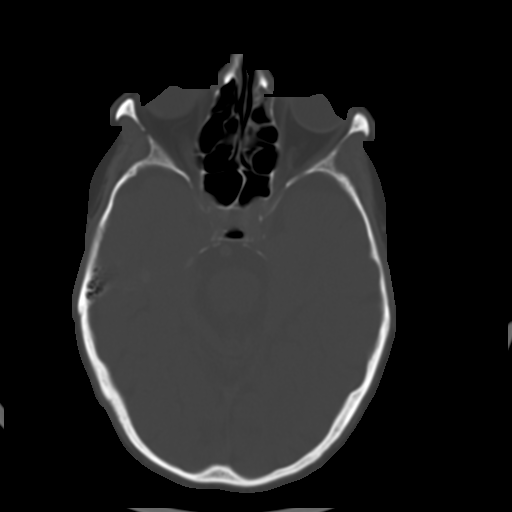
[im 14/32  brain]
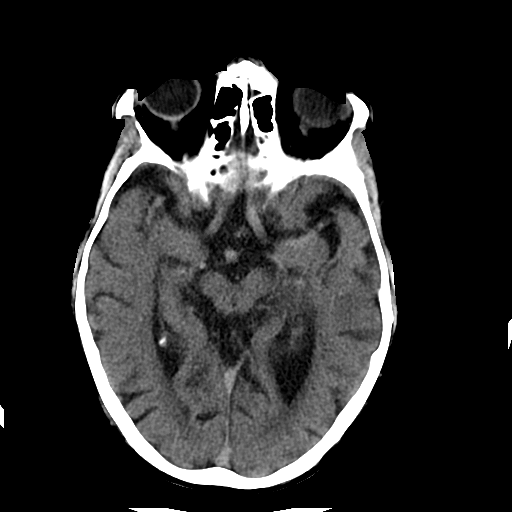
[im 16/32  brain]
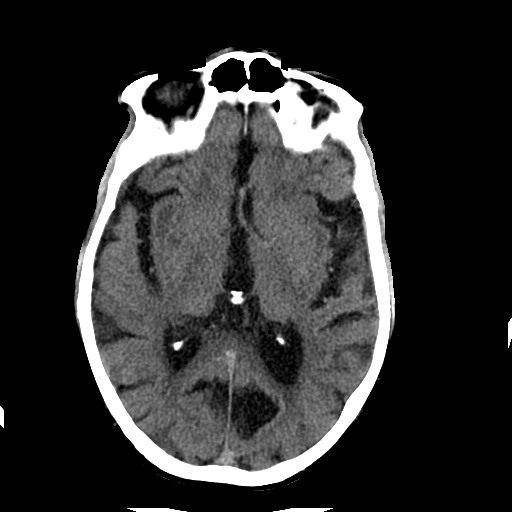
[im 18/32  brain]
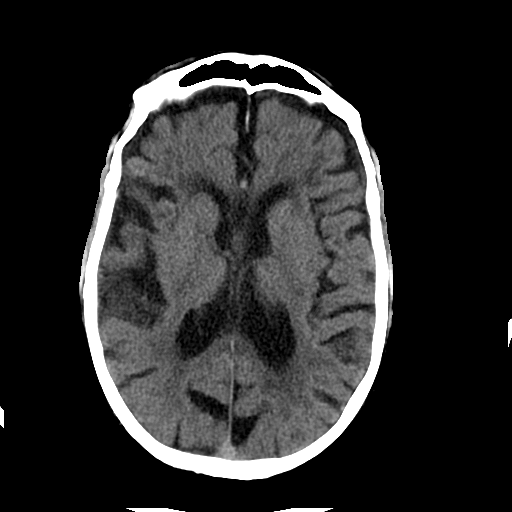
[im 20/32  brain]
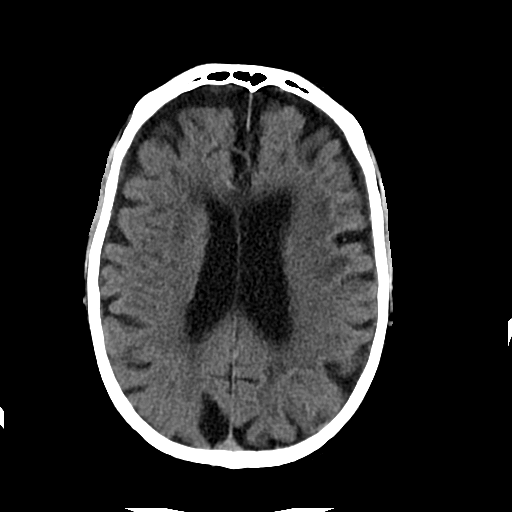
[im 20/32  bone]
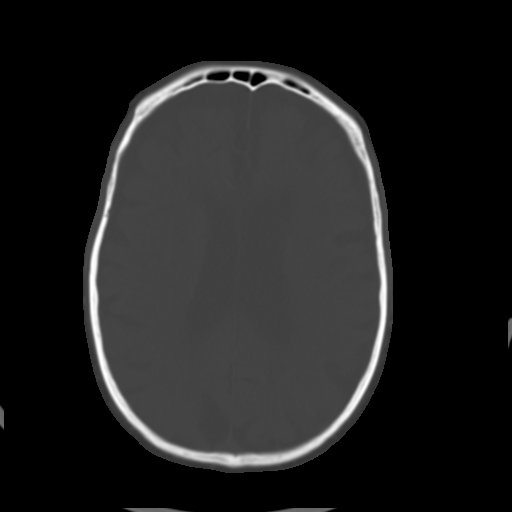
[im 23/32  brain]
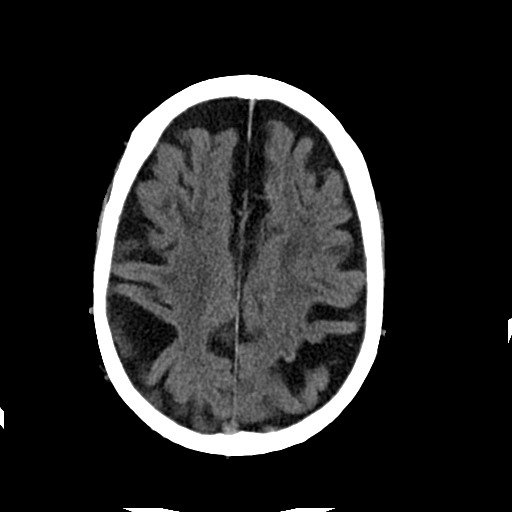
[im 25/32  brain]
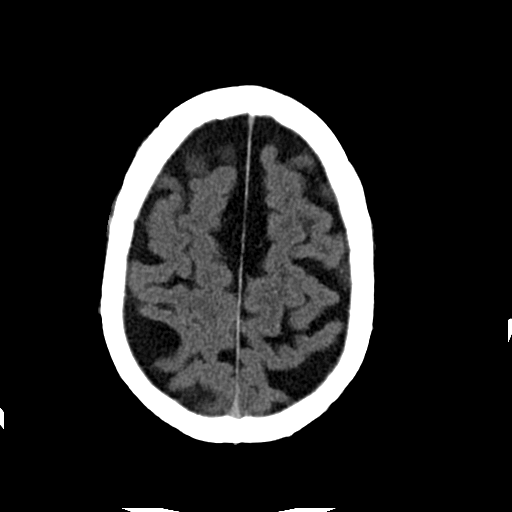
[im 27/32  brain]
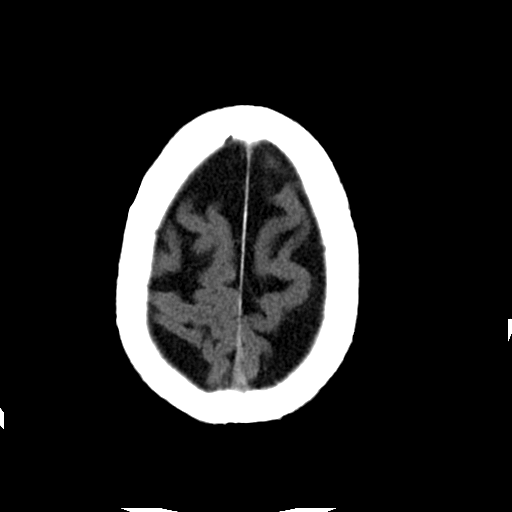
[im 29/32  brain]
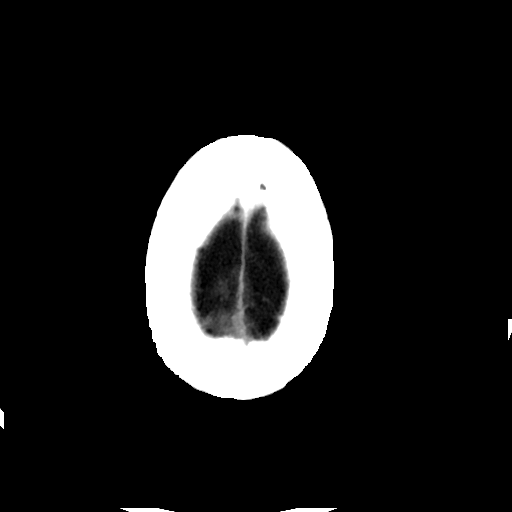
[im 29/32  bone]
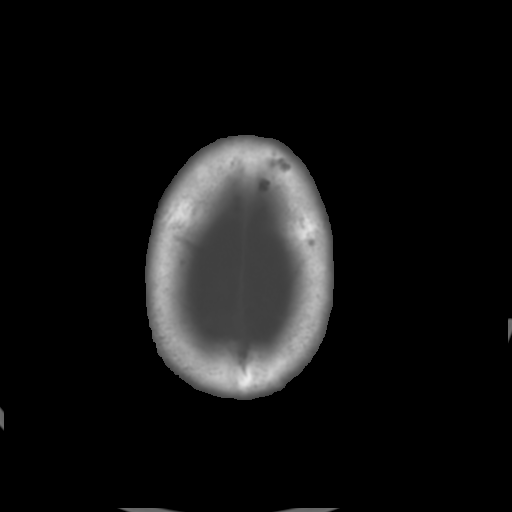

[Series 202: head w/o bone, idose (1) · axial · non-contrast · 0.44mm/px · z∈[+99,+119]mm · 2 of 32 slices shown]
[im 3/32  bone]
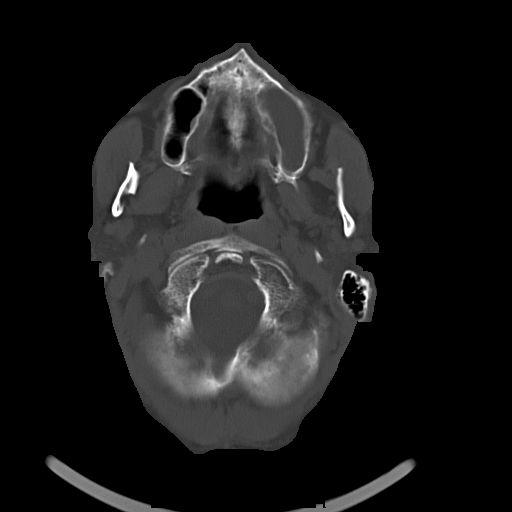
[im 7/32  bone]
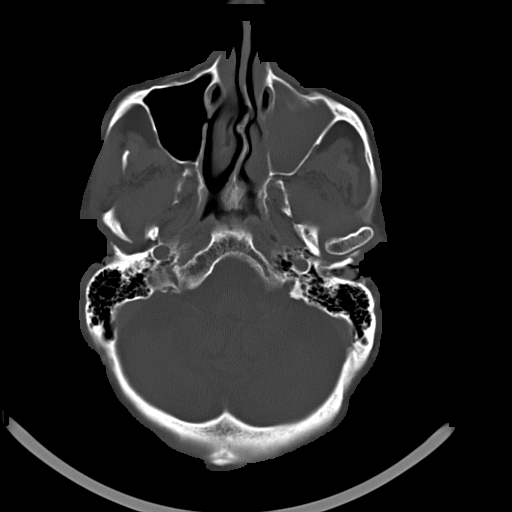

[15 of 30 positions shown; findings below may reference images not displayed]

FINDINGS: The brain shows advanced generalized atrophy. There are extensive
chronic small vessel ischemic changes throughout the white matter.
No identifiable acute infarction, mass lesion, hemorrhage,
hydrocephalus or extra-axial collection. No calvarial abnormality.
Sinuses are clear except for the left maxillary sinus which is
opacified. There is atherosclerotic calcification of the major
vessels at the base of the brain.
IMPRESSION: No acute finding by CT. Advanced generalized brain atrophy and
chronic small vessel ischemic changes.

These results were called by telephone at the time of interpretation
on 07/07/2015 at [DATE] to Dr. SORIN OXENDINE , who verbally
acknowledged these results.

## 2016-06-01 IMAGING — CR DG CHEST 1V PORT
1 series · 1 of 1 positions shown · non-contrast
Comparison: Earlier same day.  04/15/2015.

CLINICAL DATA: Pneumonia.  Shortness of breath.

EXAM:
PORTABLE CHEST 1 VIEW

[AP]
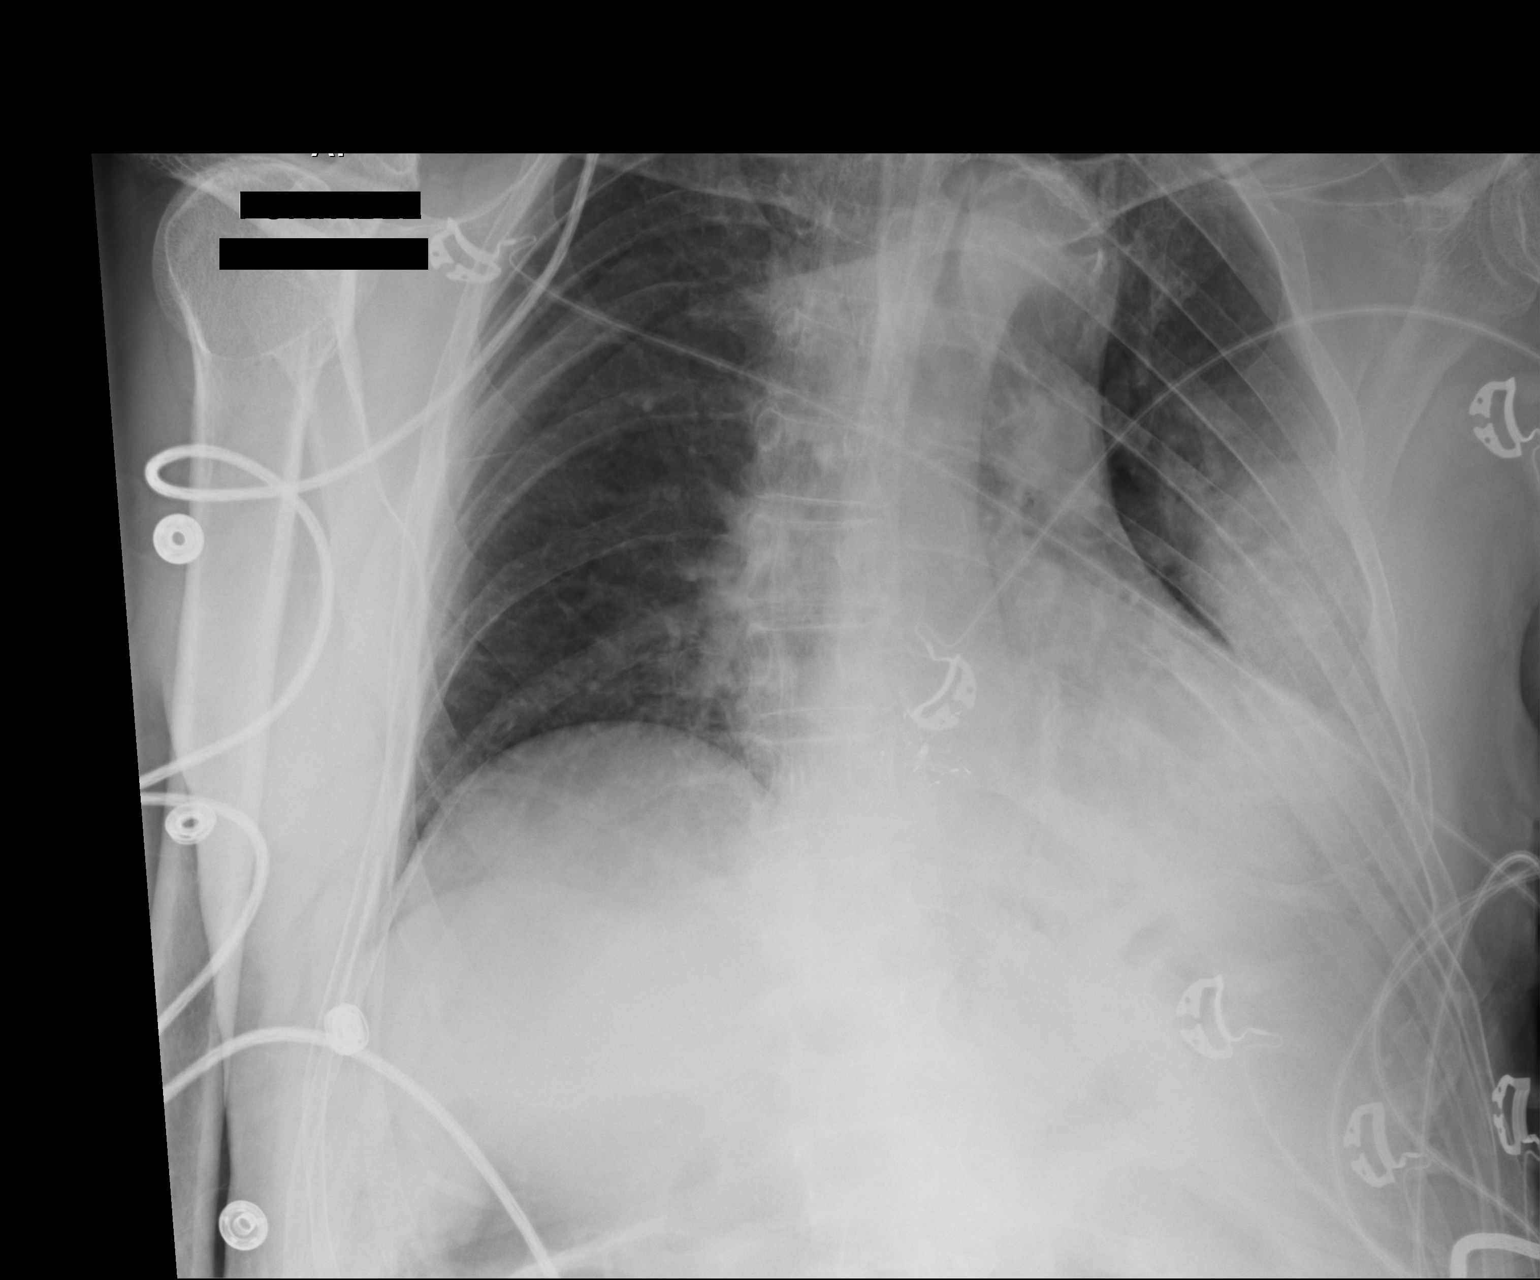

[1 of 1 positions shown; findings below may reference images not displayed]

FINDINGS: Compared to the study of only 4 hours ago, there is rapidly
worsening infiltrate in the left lower lobe. This raises the
possibility of aspiration. Right chest appears clear in this
projection. Presumed Watchman device projects over the heart.
IMPRESSION: Development of rapid airspace filling in the left lower lobe
consistent with pneumonia. Rapid development suggests possible
aspiration.

## 2016-06-02 IMAGING — DX DG CHEST 1V PORT
1 series · 1 of 1 positions shown · non-contrast
Comparison: Earlier film, same date.

CLINICAL DATA: Central line placement.

EXAM:
PORTABLE CHEST 1 VIEW

[chest ap]
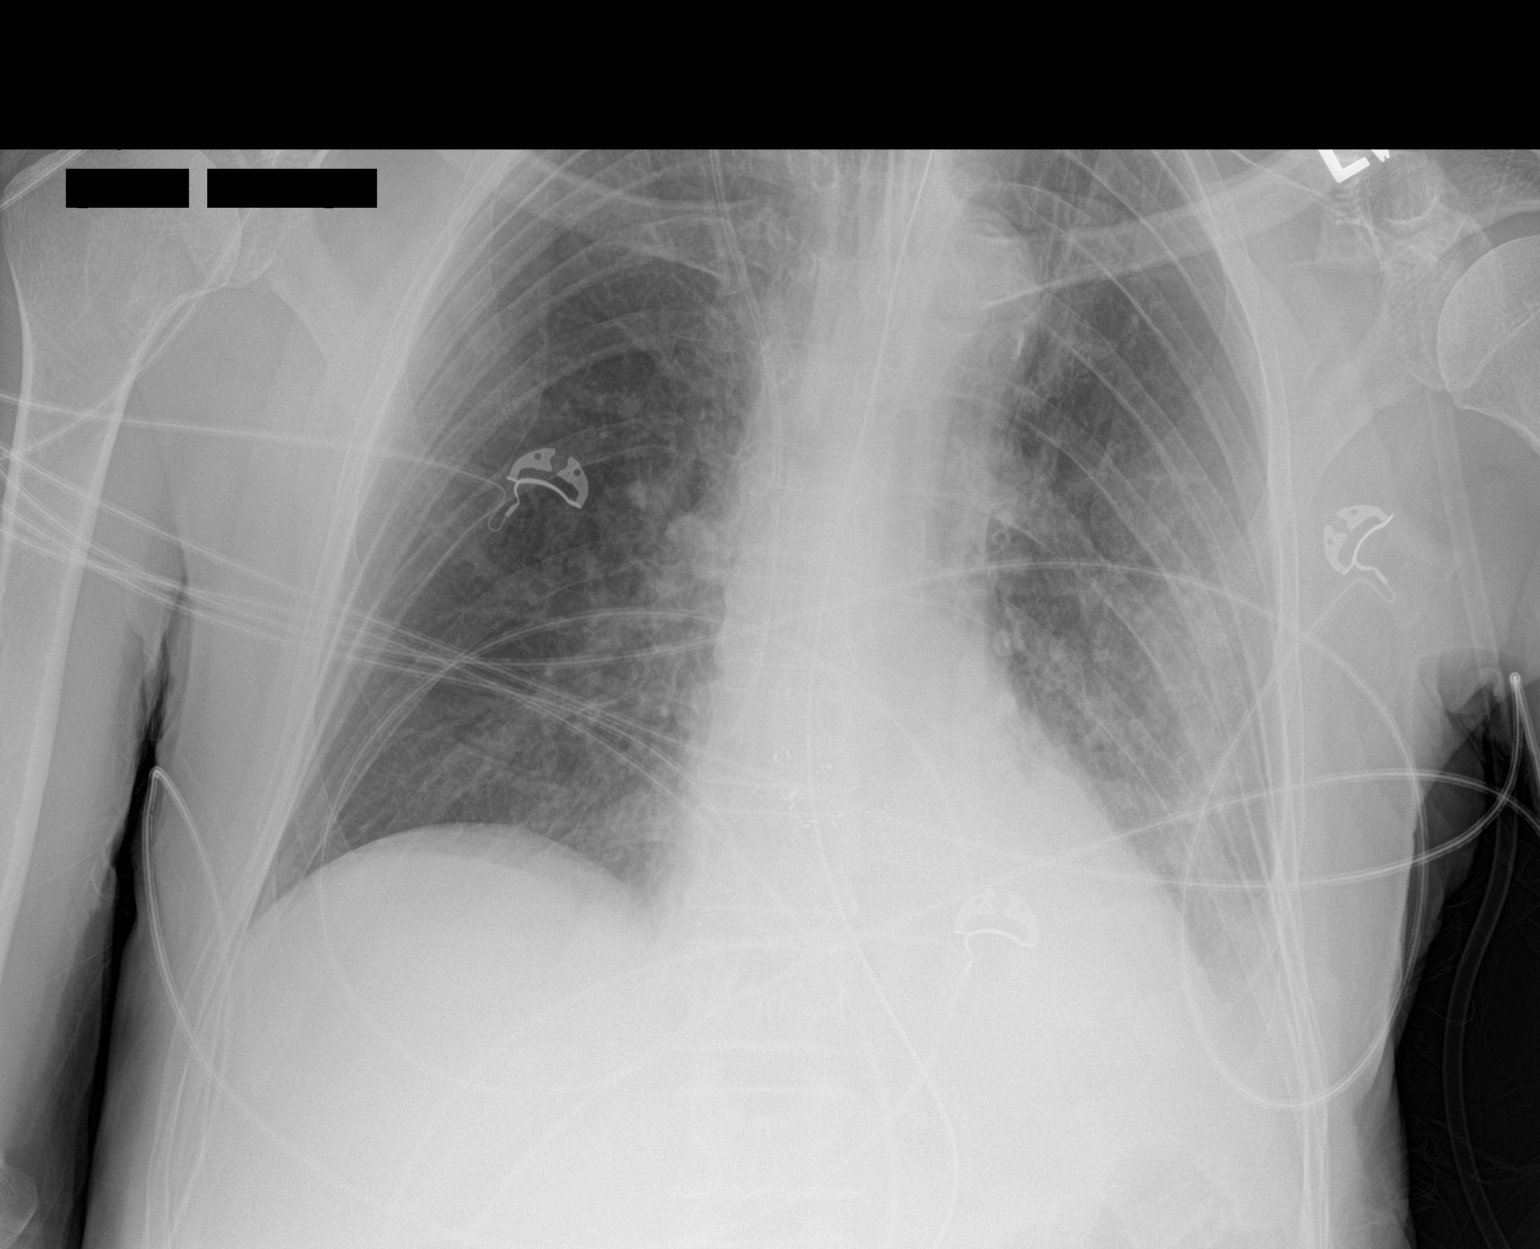

[1 of 1 positions shown; findings below may reference images not displayed]

FINDINGS: The right IJ center venous catheter tip is in the distal SVC near
the cavoatrial junction. The endotracheal tube and NG tubes are
stable. The heart and lungs are relatively stable. Slight improved
aeration in the left lower lobe.
IMPRESSION: Right IJ center venous catheter tip is in the distal SVC near the
cavoatrial junction. No complicating features.

The other support apparatus is stable.

Slight improved left lower lobe aeration.
